# Patient Record
Sex: Female | Born: 1935 | Race: White | Hispanic: No | Smoking: Never smoker
Health system: Southern US, Community
[De-identification: ages and names within clinical notes are randomized; demographics above are authoritative.]

## PROBLEM LIST (undated history)

## (undated) DIAGNOSIS — I5022 Chronic systolic (congestive) heart failure: Secondary | ICD-10-CM

## (undated) DIAGNOSIS — I519 Heart disease, unspecified: Secondary | ICD-10-CM

## (undated) DIAGNOSIS — I10 Essential (primary) hypertension: Secondary | ICD-10-CM

## (undated) DIAGNOSIS — K625 Hemorrhage of anus and rectum: Secondary | ICD-10-CM

## (undated) DIAGNOSIS — E538 Deficiency of other specified B group vitamins: Secondary | ICD-10-CM

## (undated) DIAGNOSIS — IMO0002 Reserved for concepts with insufficient information to code with codable children: Secondary | ICD-10-CM

## (undated) DIAGNOSIS — K5732 Diverticulitis of large intestine without perforation or abscess without bleeding: Secondary | ICD-10-CM

## (undated) DIAGNOSIS — M1710 Unilateral primary osteoarthritis, unspecified knee: Secondary | ICD-10-CM

## (undated) DIAGNOSIS — I509 Heart failure, unspecified: Secondary | ICD-10-CM

## (undated) DIAGNOSIS — I4891 Unspecified atrial fibrillation: Secondary | ICD-10-CM

## (undated) DIAGNOSIS — M899 Disorder of bone, unspecified: Secondary | ICD-10-CM

## (undated) DIAGNOSIS — K56609 Unspecified intestinal obstruction, unspecified as to partial versus complete obstruction: Secondary | ICD-10-CM

## (undated) DIAGNOSIS — M949 Disorder of cartilage, unspecified: Secondary | ICD-10-CM

## (undated) HISTORY — DX: Heart disease, unspecified: I51.9

## (undated) HISTORY — DX: Essential (primary) hypertension: I10

## (undated) HISTORY — DX: Disorder of cartilage, unspecified: M94.9

## (undated) HISTORY — DX: Unspecified intestinal obstruction, unspecified as to partial versus complete obstruction: K56.609

## (undated) HISTORY — DX: Heart failure, unspecified: I50.9

## (undated) HISTORY — DX: Reserved for concepts with insufficient information to code with codable children: IMO0002

## (undated) HISTORY — DX: Deficiency of other specified B group vitamins: E53.8

## (undated) HISTORY — DX: Disorder of bone, unspecified: M89.9

## (undated) HISTORY — DX: Unilateral primary osteoarthritis, unspecified knee: M17.10

## (undated) HISTORY — DX: Hemorrhage of anus and rectum: K62.5

## (undated) HISTORY — DX: Diverticulitis of large intestine without perforation or abscess without bleeding: K57.32

## (undated) HISTORY — DX: Unspecified atrial fibrillation: I48.91

## (undated) HISTORY — DX: Chronic systolic (congestive) heart failure: I50.22

---
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
## 1964-01-09 HISTORY — PX: TONSILLECTOMY: SHX5217

## 1994-01-08 HISTORY — PX: ABDOMINAL HYSTERECTOMY: SHX81

## 2004-01-09 HISTORY — PX: COLON SURGERY: SHX602

## 2007-12-09 ENCOUNTER — Encounter: Admission: RE | Admit: 2007-12-09 | Discharge: 2007-12-09 | Payer: Self-pay | Admitting: Family Medicine

## 2008-03-18 ENCOUNTER — Inpatient Hospital Stay (HOSPITAL_COMMUNITY): Admission: EM | Admit: 2008-03-18 | Discharge: 2008-03-27 | Payer: Self-pay | Admitting: Emergency Medicine

## 2008-03-19 ENCOUNTER — Encounter (INDEPENDENT_AMBULATORY_CARE_PROVIDER_SITE_OTHER): Payer: Self-pay | Admitting: Internal Medicine

## 2008-03-21 ENCOUNTER — Encounter (INDEPENDENT_AMBULATORY_CARE_PROVIDER_SITE_OTHER): Payer: Self-pay | Admitting: *Deleted

## 2008-03-25 ENCOUNTER — Ambulatory Visit: Payer: Self-pay | Admitting: Gastroenterology

## 2008-03-26 ENCOUNTER — Encounter (INDEPENDENT_AMBULATORY_CARE_PROVIDER_SITE_OTHER): Payer: Self-pay | Admitting: *Deleted

## 2008-04-13 ENCOUNTER — Encounter: Admission: RE | Admit: 2008-04-13 | Discharge: 2008-05-03 | Payer: Self-pay | Admitting: Family Medicine

## 2008-05-05 ENCOUNTER — Ambulatory Visit: Payer: Self-pay | Admitting: Gastroenterology

## 2008-05-05 DIAGNOSIS — Z860100 Personal history of colon polyps, unspecified: Secondary | ICD-10-CM | POA: Insufficient documentation

## 2008-05-05 DIAGNOSIS — K56609 Unspecified intestinal obstruction, unspecified as to partial versus complete obstruction: Secondary | ICD-10-CM | POA: Insufficient documentation

## 2008-05-05 DIAGNOSIS — R933 Abnormal findings on diagnostic imaging of other parts of digestive tract: Secondary | ICD-10-CM | POA: Insufficient documentation

## 2008-05-05 HISTORY — DX: Unspecified intestinal obstruction, unspecified as to partial versus complete obstruction: K56.609

## 2008-05-06 ENCOUNTER — Telehealth: Payer: Self-pay | Admitting: Gastroenterology

## 2008-05-20 ENCOUNTER — Telehealth: Payer: Self-pay | Admitting: Gastroenterology

## 2008-05-24 ENCOUNTER — Telehealth: Payer: Self-pay | Admitting: Gastroenterology

## 2008-05-25 ENCOUNTER — Encounter: Payer: Self-pay | Admitting: Gastroenterology

## 2008-05-25 ENCOUNTER — Ambulatory Visit: Payer: Self-pay | Admitting: Gastroenterology

## 2008-05-27 ENCOUNTER — Encounter: Payer: Self-pay | Admitting: Gastroenterology

## 2008-10-26 ENCOUNTER — Encounter: Admission: RE | Admit: 2008-10-26 | Discharge: 2008-10-26 | Payer: Self-pay | Admitting: Family Medicine

## 2008-10-26 ENCOUNTER — Encounter: Payer: Self-pay | Admitting: Family Medicine

## 2009-01-27 ENCOUNTER — Ambulatory Visit: Payer: Self-pay | Admitting: Family Medicine

## 2009-01-27 DIAGNOSIS — I509 Heart failure, unspecified: Secondary | ICD-10-CM

## 2009-01-27 DIAGNOSIS — M899 Disorder of bone, unspecified: Secondary | ICD-10-CM

## 2009-01-27 DIAGNOSIS — K5732 Diverticulitis of large intestine without perforation or abscess without bleeding: Secondary | ICD-10-CM

## 2009-01-27 DIAGNOSIS — G629 Polyneuropathy, unspecified: Secondary | ICD-10-CM | POA: Insufficient documentation

## 2009-01-27 DIAGNOSIS — M949 Disorder of cartilage, unspecified: Secondary | ICD-10-CM

## 2009-01-27 HISTORY — DX: Diverticulitis of large intestine without perforation or abscess without bleeding: K57.32

## 2009-01-27 HISTORY — DX: Heart failure, unspecified: I50.9

## 2009-01-27 HISTORY — DX: Disorder of bone, unspecified: M89.9

## 2009-01-31 ENCOUNTER — Ambulatory Visit: Payer: Self-pay | Admitting: Family Medicine

## 2009-02-08 ENCOUNTER — Ambulatory Visit: Payer: Self-pay | Admitting: Family Medicine

## 2009-02-08 DIAGNOSIS — M1710 Unilateral primary osteoarthritis, unspecified knee: Secondary | ICD-10-CM

## 2009-02-08 DIAGNOSIS — IMO0002 Reserved for concepts with insufficient information to code with codable children: Secondary | ICD-10-CM | POA: Insufficient documentation

## 2009-02-08 HISTORY — DX: Reserved for concepts with insufficient information to code with codable children: IMO0002

## 2009-02-11 ENCOUNTER — Ambulatory Visit: Payer: Self-pay | Admitting: Family Medicine

## 2009-02-14 LAB — CONVERTED CEMR LAB
BUN: 11 mg/dL (ref 6–23)
Basophils Relative: 0.1 % (ref 0.0–3.0)
CO2: 31 meq/L (ref 19–32)
Chloride: 103 meq/L (ref 96–112)
Creatinine, Ser: 0.9 mg/dL (ref 0.4–1.2)
Eosinophils Relative: 1.1 % (ref 0.0–5.0)
HCT: 38.8 % (ref 36.0–46.0)
Hemoglobin: 12.7 g/dL (ref 12.0–15.0)
Lymphocytes Relative: 23.5 % (ref 12.0–46.0)
Lymphs Abs: 1.5 10*3/uL (ref 0.7–4.0)
Monocytes Relative: 6.9 % (ref 3.0–12.0)
Neutro Abs: 4.4 10*3/uL (ref 1.4–7.7)
Potassium: 4.4 meq/L (ref 3.5–5.1)
RBC: 3.89 M/uL (ref 3.87–5.11)
Sed Rate: 39 mm/hr — ABNORMAL HIGH (ref 0–22)
WBC: 6.4 10*3/uL (ref 4.5–10.5)

## 2009-02-28 ENCOUNTER — Ambulatory Visit: Payer: Self-pay | Admitting: Family Medicine

## 2009-03-10 ENCOUNTER — Telehealth: Payer: Self-pay | Admitting: Family Medicine

## 2009-04-27 ENCOUNTER — Ambulatory Visit: Payer: Self-pay | Admitting: Family Medicine

## 2009-07-13 ENCOUNTER — Ambulatory Visit: Payer: Self-pay | Admitting: Family Medicine

## 2009-07-25 ENCOUNTER — Encounter: Payer: Self-pay | Admitting: Family Medicine

## 2009-07-25 DIAGNOSIS — I5022 Chronic systolic (congestive) heart failure: Secondary | ICD-10-CM

## 2009-07-25 HISTORY — DX: Chronic systolic (congestive) heart failure: I50.22

## 2009-08-01 ENCOUNTER — Ambulatory Visit: Payer: Self-pay | Admitting: Family Medicine

## 2009-08-03 ENCOUNTER — Encounter: Payer: Self-pay | Admitting: Family Medicine

## 2009-09-21 ENCOUNTER — Telehealth: Payer: Self-pay | Admitting: Family Medicine

## 2009-09-30 ENCOUNTER — Ambulatory Visit: Payer: Self-pay | Admitting: Family Medicine

## 2009-09-30 DIAGNOSIS — I10 Essential (primary) hypertension: Secondary | ICD-10-CM | POA: Insufficient documentation

## 2009-09-30 HISTORY — DX: Essential (primary) hypertension: I10

## 2009-10-06 ENCOUNTER — Ambulatory Visit: Payer: Self-pay | Admitting: Family Medicine

## 2009-10-11 ENCOUNTER — Ambulatory Visit: Payer: Self-pay | Admitting: Internal Medicine

## 2009-10-18 ENCOUNTER — Ambulatory Visit: Payer: Self-pay | Admitting: Family Medicine

## 2009-10-25 ENCOUNTER — Ambulatory Visit: Payer: Self-pay | Admitting: Family Medicine

## 2009-11-01 ENCOUNTER — Ambulatory Visit: Payer: Self-pay | Admitting: Family Medicine

## 2009-11-02 ENCOUNTER — Ambulatory Visit: Payer: Self-pay | Admitting: Family Medicine

## 2009-11-21 ENCOUNTER — Encounter: Admission: RE | Admit: 2009-11-21 | Discharge: 2009-11-21 | Payer: Self-pay | Admitting: Family Medicine

## 2009-11-25 ENCOUNTER — Ambulatory Visit: Payer: Self-pay | Admitting: Family Medicine

## 2009-11-25 DIAGNOSIS — E538 Deficiency of other specified B group vitamins: Secondary | ICD-10-CM

## 2009-11-25 HISTORY — DX: Deficiency of other specified B group vitamins: E53.8

## 2009-11-30 ENCOUNTER — Ambulatory Visit: Payer: Self-pay | Admitting: Family Medicine

## 2009-11-30 DIAGNOSIS — H6120 Impacted cerumen, unspecified ear: Secondary | ICD-10-CM | POA: Insufficient documentation

## 2009-12-22 ENCOUNTER — Ambulatory Visit: Payer: Self-pay | Admitting: Family Medicine

## 2010-01-04 ENCOUNTER — Telehealth: Payer: Self-pay | Admitting: Family Medicine

## 2010-02-01 ENCOUNTER — Ambulatory Visit
Admission: RE | Admit: 2010-02-01 | Discharge: 2010-02-01 | Payer: Self-pay | Source: Home / Self Care | Attending: Family Medicine | Admitting: Family Medicine

## 2010-02-01 DIAGNOSIS — K219 Gastro-esophageal reflux disease without esophagitis: Secondary | ICD-10-CM | POA: Insufficient documentation

## 2010-02-01 DIAGNOSIS — K644 Residual hemorrhoidal skin tags: Secondary | ICD-10-CM | POA: Insufficient documentation

## 2010-02-06 ENCOUNTER — Telehealth: Payer: Self-pay | Admitting: Family Medicine

## 2010-02-07 NOTE — Assessment & Plan Note (Signed)
Summary: Follow up/cb   Vital Signs:  Patient profile:   77 year old female Menstrual status:  hysterectomy Weight:      152 pounds Temp:     98.1 degrees F oral BP sitting:   130 / 80  (left arm) Cuff size:   regular  Vitals Entered By: Duard Brady LPN (November 30, 2009 8:03 AM) CC: f/u on blood pressure - med still make her feel lightheaded Is Patient Diabetic? No   History of Present Illness: Patient seen for followup.  Hypertension and had recent side effects with amlodipine. We started losartan. She has had some very mild nausea without vomiting and slight lightheadedness. Blood pressures have been improved though. No other side effects. Still exercising regularly.  History of neuropathy mostly right thigh with reported prior history of shingles. She recalls being intolerant of multiple medications including Neurontin. She does not recall if she tried Lyrica. Pain is moderate. She is reluctant to consider other medications at this time.  She has ongoing problems with hemorrhoids. Using hydrocortisone suppositories and steroid cream without much improvement. No recent constipation issues. Recent colonoscopy.  Allergies: 1)  ! Penicillin 2)  ! Septra 3)  ! Codeine 4)  Macrobid (Nitrofurantoin Monohyd Macro)  Past History:  Past Medical History: Last updated: 01/27/2009 Hypertension Urinary Tract Infection Congestive Heart Failure 2005 ?etiology Arthritis Endometriosis Pneumonia 2005 Diverticultitis 2006 SBO, adhesions 2010 ?Osteopenia  Past Surgical History: Last updated: 01/27/2009 Sigmoid colectomy for diverticulitis 2006 TAHBSO 1966 Appendectomy 1951 Hysterectomy, 1934 Tonsillectomy, 1966  Family History: Last updated: 01/27/2009 No FH of Colon Cancer: Family History of Colitis/Crohn's:Father Family History of Arthritis Heart disease, mother Stroke, mother Family History Hypertension, mother Diabebts  Social History: Last updated:  05/05/2008 Patient has never smoked.  Alcohol Use - no Illicit Drug Use - no Occupation: Retired  Risk Factors: Smoking Status: never (02/11/2009) PMH-FH-SH reviewed for relevance  Review of Systems  The patient denies anorexia, fever, weight loss, chest pain, syncope, dyspnea on exertion, peripheral edema, prolonged cough, headaches, hemoptysis, abdominal pain, melena, hematochezia, severe indigestion/heartburn, and muscle weakness.    Physical Exam  General:  Well-developed,well-nourished,in no acute distress; alert,appropriate and cooperative throughout examination Ears:  cerumen impaction right canal. Removed with curet without difficulty Mouth:  Oral mucosa and oropharynx without lesions or exudates.  Teeth in good repair. Neck:  No deformities, masses, or tenderness noted. Lungs:  Normal respiratory effort, chest expands symmetrically. Lungs are clear to auscultation, no crackles or wheezes. Heart:  normal rate and regular rhythm.   Abdomen:  soft, non-tender, no distention, no masses, no guarding, no rigidity, no hepatomegaly, and no splenomegaly.  multiple scars from prior surgery Extremities:  no edema Neurologic:  alert & oriented X3 and cranial nerves II-XII intact.     Impression & Recommendations:  Problem # 1:  ESSENTIAL HYPERTENSION (ICD-401.9) Assessment Improved  Her updated medication list for this problem includes:    Losartan Potassium 50 Mg Tabs (Losartan potassium) ..... One by mouth once daily  Problem # 2:  NEUROPATHY (ICD-355.9) she will check to see if has used Lyrica or neurontin previously, though she has been intolerant to many meds in past.  Problem # 3:  CERUMEN IMPACTION (ICD-380.4)  Problem # 4:  EXTERNAL HEMORRHOIDS (ICD-455.3) discussed with pt possible referral to general surgeon given her ongoing symptoms for months not relieved with conservative measures.  Complete Medication List: 1)  Multivitamins Tabs (Multiple vitamin) .... Once  daily, hold 2)  Loratadine 10 Mg Tabs (Loratadine) .Marland KitchenMarland KitchenMarland Kitchen  Once daily 3)  Proctozone-hc 2.5 % Crea (Hydrocortisone) .... Use as directed as needed. 4)  Hydrocortisone Acetate 25 Mg Supp (Hydrocortisone acetate) .... One supp two times a day for 2 weeks 5)  Losartan Potassium 50 Mg Tabs (Losartan potassium) .... One by mouth once daily 6)  Cyanocobalamin 1000 Mcg/ml Soln (Cyanocobalamin) .... One ml im weekly x 4 weeks, then monthly.  Patient Instructions: 1)  Please schedule a follow-up appointment in 2 months.  2)  Take losartan at night 3)  Check to see if you have taken Lyrica or  Neurontin previously for neuropathy pain   Orders Added: 1)  Est. Patient Level IV [70962]

## 2010-02-07 NOTE — Assessment & Plan Note (Signed)
Summary: RECTAL BLEEDING // RS   Vital Signs:  Patient profile:   77 year old female Menstrual status:  hysterectomy Weight:      152 pounds Temp:     98.2 degrees F oral BP sitting:   130 / 80  (left arm) Cuff size:   regular  Vitals Entered By: Kathrynn Speed CMA (July 13, 2009 10:29 AM) CC: Rectal Bleeding [name]   History of Present Illness: Patient seen with bright red blood per rectum initial onset last Friday. At that point  no pain with bowel movement. Now has some burning sensation with bowel movement. A couple episodes of some bright blood with wiping. No history of hemorrhoids. Colonoscopy May 25, 2008 with benign colon polyp. Patient denies aspirin use. No significant constipation or diarrhea. Has used hydrocortisone cream in the past with some improvement in hemorrhoids.  Denies appetite or weight changes.  Current Medications (verified): 1)  Multivitamins   Tabs (Multiple Vitamin) .... Once Daily, Hold 2)  Loratadine 10 Mg Tabs (Loratadine) .... Once Daily 3)  Hemorrhoidal 79.3-3 % Oint (Rectal Protectant-Emollient) .... As Needed 4)  Desitin 40 % Oint (Zinc Oxide) .... As Needed  Allergies (verified): 1)  ! Penicillin 2)  ! Septra 3)  ! Codeine 4)  Macrobid (Nitrofurantoin Monohyd Macro)  Past History:  Past Medical History: Last updated: 01/27/2009 Hypertension Urinary Tract Infection Congestive Heart Failure 2005 ?etiology Arthritis Endometriosis Pneumonia 2005 Diverticultitis 2006 SBO, adhesions 2010 ?Osteopenia  Past Surgical History: Last updated: 01/27/2009 Sigmoid colectomy for diverticulitis 2006 TAHBSO 1966 Appendectomy 1951 Hysterectomy, 1934 Tonsillectomy, 1966  Family History: Last updated: 01/27/2009 No FH of Colon Cancer: Family History of Colitis/Crohn's:Father Family History of Arthritis Heart disease, mother Stroke, mother Family History Hypertension, mother Diabebts PMH-FH-SH reviewed for relevance  Review of Systems       The patient complains of hematochezia.  The patient denies anorexia, fever, weight loss, chest pain, abdominal pain, melena, and severe indigestion/heartburn.    Physical Exam  General:  Well-developed,well-nourished,in no acute distress; alert,appropriate and cooperative throughout examination Lungs:  Normal respiratory effort, chest expands symmetrically. Lungs are clear to auscultation, no crackles or wheezes. Heart:  normal rate and regular rhythm.   Rectal:  patient has very inflamed external hemorrhoids. No active bleeding. No visible fissure. Digital exam not attempted given her discomfort and degree of inflammation of hemorrhoids.  These extend into rectum.   Impression & Recommendations:  Problem # 1:  HEMORRHOIDS, EXTERNAL (ICD-455.3) Assessment New hydrocortisone supp and she will contine with topical steroid.  Discussed constipation prevention.  Complete Medication List: 1)  Multivitamins Tabs (Multiple vitamin) .... Once daily, hold 2)  Loratadine 10 Mg Tabs (Loratadine) .... Once daily 3)  Hemorrhoidal 79.3-3 % Oint (Rectal protectant-emollient) .... As needed 4)  Desitin 40 % Oint (Zinc oxide) .... As needed 5)  Proctozone-hc 2.5 % Crea (Hydrocortisone) .... Use as directed as needed. 6)  Hydrocortisone Acetate 25 Mg Supp (Hydrocortisone acetate) .... One suppository two times a day for one week.  Patient Instructions: 1)  drink plenty of fluids 2)  Eat plenty of fiber 3)  Consider stool softener such as Colace one to 2 capsules daily to prevent constipation 4)  Please schedule a follow-up appointment in 2 weeks.  Prescriptions: PROCTOZONE-HC 2.5 % CREA (HYDROCORTISONE) use as directed as needed.  #28.5 gm x 1   Entered and Authorized by:   Evelena Peat MD   Signed by:   Evelena Peat MD on 07/13/2009   Method  used:   Electronically to        [facility]  [phone]* (retail)       [address]       [place],  [place]  [address]       Ph: [phone] or [phone]       Fax: [phone]   RxID:   [id number] HYDROCORTISONE ACETATE 25 MG SUPP (HYDROCORTISONE ACETATE) one suppository two times a day for one week.  #14 x 1   Entered and Authorized by:   Evelena Peat MD   Signed by:   Evelena Peat MD on 07/13/2009   Method used:   Electronically to        [facility]  [phone]* (retail)       [address]       [place], [place]  [address]       Ph: [phone] or [phone]       Fax: [phone]   RxID:   [id number]

## 2010-02-07 NOTE — Letter (Signed)
Summary: [facility]  [facility]   Imported By: Maryln Gottron 08/24/2009 11:22:10  _____________________________________________________________________  External Attachment:    Type:   Image     Comment:   External Document

## 2010-02-07 NOTE — Assessment & Plan Note (Signed)
Summary: body aches/legs swelling/cjr   Vital Signs:  Patient profile:   77 year old female Menstrual status:  hysterectomy Weight:      148 pounds Temp:     99.2 degrees F oral BP sitting:   130 / 80  (left arm) Cuff size:   regular  Vitals Entered By: Sid Falcon LPN (February 08, 2009 9:40 AM) CC: Body achesl right leg swelling Is Patient Diabetic? No   History of Present Illness: Patient here predominantly for right knee pain. Recent sinus infection treated with Levaquin. She's had some mild body aches and wonders if the medication may have been related. No skin rash. History of known osteoarthritis right knee. Recently had some swelling. No recurrent injury. Inject with cortisone over year ago and had good response. Is inquiring about whether the same to be done today. No warmth or erythema.  Tylenol  without much relief.  Preventive Screening-Counseling & Management  Alcohol-Tobacco     Smoking Status: never  Allergies: 1)  ! Penicillin 2)  ! Septra 3)  ! Codeine 4)  Macrobid (Nitrofurantoin Monohyd Macro)  Past History:  Past Medical History: Last updated: 01/27/2009 Hypertension Urinary Tract Infection Congestive Heart Failure 2005 ?etiology Arthritis Endometriosis Pneumonia 2005 Diverticultitis 2006 SBO, adhesions 2010 ?Osteopenia  Past Surgical History: Last updated: 01/27/2009 Sigmoid colectomy for diverticulitis 2006 TAHBSO 1966 Appendectomy 1951 Hysterectomy, 1934 Tonsillectomy, 1966  Review of Systems  The patient denies fever, weight loss, chest pain, dyspnea on exertion, and prolonged cough.    Physical Exam  General:  Well-developed,well-nourished,in no acute distress; alert,appropriate and cooperative throughout examination Ears:  External ear exam shows no significant lesions or deformities.  Otoscopic examination reveals clear canals, tympanic membranes are intact bilaterally without bulging, retraction, inflammation or discharge.  Hearing is grossly normal bilaterally. Nose:  External nasal examination shows no deformity or inflammation. Nasal mucosa are pink and moist without lesions or exudates. Mouth:  Oral mucosa and oropharynx without lesions or exudates.  Teeth in good repair. Neck:  No deformities, masses, or tenderness noted. Lungs:  Normal respiratory effort, chest expands symmetrically. Lungs are clear to auscultation, no crackles or wheezes. Heart:  normal rate and regular rhythm.   Extremities:  right knee reveals very mild effusion. No warmth or erythema. Full range of motion. Mild to moderate medial joint line tenderness. Anterior cruciate ligament testing is normal   Impression & Recommendations:  Problem # 1:  OSTEOARTHRITIS, KNEE, RIGHT (ICD-715.96)  patient requesting repeat corticosteroid injection and has been over a year since last. Discussed risk and benefits including risk of bleeding, bruising, infection and patient wished to proceed.  knee prepped with Betadine and using 1 and 1/2 inch 25-gauge needle injected 2 cc of plain Xylocaine and 80 mg of Depo-Medrol without difficulty patient tolerated well  Orders: Joint Aspirate / Injection, Large (20610) Depo- Medrol 80mg  (J1040)  Complete Medication List: 1)  Multivitamins Tabs (Multiple vitamin) .... One tablet by mouth once daily 2)  Prevacid 30 Mg Cpdr (Lansoprazole) .... As needed 3)  Calcium 600/vitamin D 600-400 Mg-unit Tabs (Calcium carbonate-vitamin d) .... Once daily 4)  Loratadine 10 Mg Tabs (Loratadine) .... Once daily 5)  Levaquin 500 Mg Tabs (Levofloxacin) .... One by mouth once daily for 10 days. 6)  Proctozone-hc 2.5 % Crea (Hydrocortisone) .... Use as directed as needed.  Patient Instructions: 1)  Resume usual exercises as tolerated in 3-4 days 2)  Leave off Levaquin at this point. 3)  followup promptly if you have any  fever or worsening symptoms.

## 2010-02-07 NOTE — Assessment & Plan Note (Signed)
Summary: B12 INJ // RS  Nurse Visit   Allergies: 1)  ! Penicillin 2)  ! Septra 3)  ! Codeine 4)  Macrobid (Nitrofurantoin Monohyd Macro)  Medication Administration  Injection # 1:    Medication: Vit B12 1000 mcg    Diagnosis: FATIGUE (ICD-780.79)    Route: IM    Site: L deltoid    Exp Date: 07/09/2011    Lot #: [id number]    Mfr: [employer]    Patient tolerated injection without complications    Given by: Sid Falcon LPN (November 01, 2009 12:19 PM)  Orders Added: 1)  Vit B12 1000 mcg [J3420] 2)  Admin of Therapeutic Inj  intramuscular or subcutaneous [91478]

## 2010-02-07 NOTE — Assessment & Plan Note (Signed)
Summary: reassess blood pressure/cb/hemorrhoid pain/cjr   Vital Signs:  Patient profile:   77 year old female Menstrual status:  hysterectomy Weight:      152 pounds Temp:     98.7 degrees F oral BP sitting:   160 / 90  (left arm) Cuff size:   regular  Vitals Entered By: Sid Falcon LPN (September 30, 2009 9:33 AM)  Serial Vital Signs/Assessments:  Time      Position  BP       Pulse  Resp  Temp     By                     162/90                         Evelena Peat MD  CC: BP check, hemmoroids   History of Present Illness: Patient seen for the following  Elevated blood pressure last visit. Overall feels poorly with less energy. No headaches or chest pains. No dizziness. Not monitoring blood pressure at home. Never treated with medication.  Sensation of mouth burning past several weeks. Questionable history of B12 deficiency previously-per pt. She takes MVI and reportedly took B12 injections briefly in [place].  No recent change of toothpaste. No regular mouthwash.  Recent inflamed external hemorrhoids. Occasional bright red blood. Colonoscopy May of 2010 significant for some internal hemorrhoids. No appetite or weight changes. Triamcinolone cream but she feels this causes burning to be worse. Has previiously used suppositories.  Allergies: 1)  ! Penicillin 2)  ! Septra 3)  ! Codeine 4)  Macrobid (Nitrofurantoin Monohyd Macro)  Past History:  Past Medical History: Last updated: 01/27/2009 Hypertension Urinary Tract Infection Congestive Heart Failure 2005 ?etiology Arthritis Endometriosis Pneumonia 2005 Diverticultitis 2006 SBO, adhesions 2010 ?Osteopenia  Past Surgical History: Last updated: 01/27/2009 Sigmoid colectomy for diverticulitis 2006 TAHBSO 1966 Appendectomy 1951 Hysterectomy, 1934 Tonsillectomy, 1966  Family History: Last updated: 01/27/2009 No FH of Colon Cancer: Family History of Colitis/Crohn's:Father Family History of Arthritis Heart  disease, mother Stroke, mother Family History Hypertension, mother Diabebts  Social History: Last updated: 05/05/2008 Patient has never smoked.  Alcohol Use - no Illicit Drug Use - no Occupation: Retired  Risk Factors: Smoking Status: never (02/11/2009) PMH-FH-SH reviewed for relevance  Review of Systems  The patient denies anorexia, fever, weight loss, chest pain, syncope, dyspnea on exertion, peripheral edema, prolonged cough, headaches, hemoptysis, abdominal pain, melena, severe indigestion/heartburn, muscle weakness, and depression.    Physical Exam  General:  Well-developed,well-nourished,in no acute distress; alert,appropriate and cooperative throughout examination Mouth:  Oral mucosa and oropharynx without lesions or exudates.  Teeth in good repair. Neck:  No deformities, masses, or tenderness noted. Lungs:  Normal respiratory effort, chest expands symmetrically. Lungs are clear to auscultation, no crackles or wheezes. Heart:  Normal rate and regular rhythm. S1 and S2 normal without gallop, murmur, click, rub or other extra sounds. Rectal:  pt has inflammed external hemorrhoids without active bleeding.  No thrombosis. Extremities:  No clubbing, cyanosis, edema, or deformity noted with normal full range of motion of all joints.     Impression & Recommendations:  Problem # 1:  ESSENTIAL HYPERTENSION (ICD-401.9) Assessment New  start amlodipine 2.5 mg daily and reassess in one month.  Her updated medication list for this problem includes:    Amlodipine Besylate 2.5 Mg Tabs (Amlodipine besylate) ..... One by mouth once daily  Orders: Prescription Created Electronically [id number])  Problem # 2:  HEMORRHOIDS,  EXTERNAL (ICD-455.3) Assessment: Unchanged  Will try hydrocortisone supp.  Discussed measures to reduce constipation.  Orders: Prescription Created Electronically [phone])  Problem # 3:  DYSESTHESIA (ICD-782.0)  involving mouth.  check  B12.  Orders: Venipuncture (98119) Specimen Handling (14782) TLB-B12, Serum-Total ONLY (95621-H08)  Complete Medication List: 1)  Multivitamins Tabs (Multiple vitamin) .... Once daily, hold 2)  Loratadine 10 Mg Tabs (Loratadine) .... Once daily 3)  Proctozone-hc 2.5 % Crea (Hydrocortisone) .... Use as directed as needed. 4)  Hydrocortisone Acetate 25 Mg Supp (Hydrocortisone acetate) .... One supp two times a day for 2 weeks 5)  Amlodipine Besylate 2.5 Mg Tabs (Amlodipine besylate) .... One by mouth once daily  Patient Instructions: 1)  Please schedule a follow-up appointment in 1 month.  Prescriptions: HYDROCORTISONE ACETATE 25 MG SUPP (HYDROCORTISONE ACETATE) one supp two times a day for 2 weeks  #28 x 0   Entered and Authorized by:   Evelena Peat MD   Signed by:   Evelena Peat MD on 09/30/2009   Method used:   Electronically to        [facility]  [phone]* (retail)       [address]       [place], [place]  [address]       Ph: [phone] or [phone]       Fax: [phone]   RxID:   [id number] AMLODIPINE BESYLATE 2.5 MG TABS (AMLODIPINE BESYLATE) one by mouth once daily  #30 x 5   Entered and Authorized by:   Evelena Peat MD   Signed by:   Evelena Peat MD on 09/30/2009   Method used:   Electronically to        [facility]  [phone]* (retail)       [address]       [place], [place]  [address]       Ph: [phone] or [phone]       Fax: [phone]   RxID:   [id number]    Immunization History:  Influenza Immunization History:    Influenza:  historical (09/23/2009)

## 2010-02-07 NOTE — Letter (Signed)
Summary: Records from [facility] 2010   Records from [facility] 2010   Imported By: Maryln Gottron 07/22/2009 13:18:38  _____________________________________________________________________  External Attachment:    Type:   Image     Comment:   External Document

## 2010-02-07 NOTE — Assessment & Plan Note (Signed)
Summary: 1 mo rov/mm   Vital Signs:  Patient profile:   77 year old female Menstrual status:  hysterectomy Weight:      145 pounds Temp:     99.4 degrees F oral BP sitting:   122 / 72  (left arm) Cuff size:   regular  Vitals Entered By: Sid Falcon LPN (February 28, 2009 10:02 AM) CC: One month follow-up   History of Present Illness: Persistent right knee pain. History known osteoarthritis. No recent injury. Pain worse early morning. Generally improves somewhat with activity. Some stiffness. Recent steroid injection without much improvement. Has not tried topicals such as voltaren gel. Has not noted any warmth or erythema.  Allergies: 1)  ! Penicillin 2)  ! Septra 3)  ! Codeine 4)  Macrobid (Nitrofurantoin Monohyd Macro)  Past History:  Past Medical History: Last updated: 01/27/2009 Hypertension Urinary Tract Infection Congestive Heart Failure 2005 ?etiology Arthritis Endometriosis Pneumonia 2005 Diverticultitis 2006 SBO, adhesions 2010 ?Osteopenia  Social History: Last updated: 05/05/2008 Patient has never smoked.  Alcohol Use - no Illicit Drug Use - no Occupation: Retired PMH reviewed for relevance, PSH reviewed for relevance  Review of Systems      See HPI  Physical Exam  General:  Well-developed,well-nourished,in no acute distress; alert,appropriate and cooperative throughout examination Mouth:  Oral mucosa and oropharynx without lesions or exudates.  Teeth in good repair. Neck:  No deformities, masses, or tenderness noted. Lungs:  Normal respiratory effort, chest expands symmetrically. Lungs are clear to auscultation, no crackles or wheezes. Heart:  normal rate and regular rhythm.   Extremities:  right knee reveals mild crepitus with flexion and extension. No warmth erythema. No effusion. Minimal lateral joint line tenderness   Impression & Recommendations:  Problem # 1:  OSTEOARTHRITIS, KNEE, RIGHT (ICD-715.96) trial of Pennsaid topical.     samples given and instructed in use.  Complete Medication List: 1)  Multivitamins Tabs (Multiple vitamin) .... Once daily, hold 2)  Prevacid 30 Mg Cpdr (Lansoprazole) .... As needed 3)  Calcium 600/vitamin D 600-400 Mg-unit Tabs (Calcium carbonate-vitamin d) .... Once daily, on hold 4)  Loratadine 10 Mg Tabs (Loratadine) .... Once daily 5)  Proctozone-hc 2.5 % Crea (Hydrocortisone) .... As needed.  Patient Instructions: 1)  Pennsaid- use 30-40 drops to right knee 4 times daily.  Wash hands after use.

## 2010-02-07 NOTE — Assessment & Plan Note (Signed)
Summary: B-12//ALP  Nurse Visit   Allergies: 1)  ! Penicillin 2)  ! Septra 3)  ! Codeine 4)  Macrobid (Nitrofurantoin Monohyd Macro)  Medication Administration  Injection # 1:    Medication: Vit B12 1000 mcg    Diagnosis: B12 DEFICIENCY (ICD-266.2)    Route: IM    Site: R deltoid    Exp Date: 07/09/2011    Lot #: [id number]    Mfr: [employer]    Patient tolerated injection without complications    Given by: Sid Falcon LPN (November 25, 2009 10:18 AM)  Orders Added: 1)  Vit B12 1000 mcg [J3420] 2)  Admin of Therapeutic Inj  intramuscular or subcutaneous [[id number]]

## 2010-02-07 NOTE — Assessment & Plan Note (Signed)
Summary: F/U FROM SINUSITIS, H/A // RS   Vital Signs:  Patient profile:   77 year old female Menstrual status:  hysterectomy Temp:     99.1 degrees F oral BP sitting:   140 / 88  (left arm) Cuff size:   regular  Vitals Entered By: Sid Falcon LPN (January 31, 2009 1:16 PM) CC: Follow-up for sinusitis, headache   History of Present Illness: Patient has had some recent sinus infection symptoms. Treated through urgent care with azithromycin almost 10 days ago. Slightly improved last week but now has some recurrent headaches occasional yellow-green nasal tinged mucus and intermittent facial pain. Occ cough. No dyspnea. Intermittent bilateral earache and sore throat. She is allergic to penicillin, sulfa, Macrobid, and codeine.  Pt also relates hx of "hemorrhoids" and requests refill of proctozone cream which worked well for her.  Allergies: 1)  ! Penicillin 2)  ! Septra 3)  ! Codeine 4)  Macrobid (Nitrofurantoin Monohyd Macro)  Past History:  Past Medical History: Last updated: 01/27/2009 Hypertension Urinary Tract Infection Congestive Heart Failure 2005 ?etiology Arthritis Endometriosis Pneumonia 2005 Diverticultitis 2006 SBO, adhesions 2010 ?Osteopenia  Past Surgical History: Last updated: 01/27/2009 Sigmoid colectomy for diverticulitis 2006 TAHBSO 1966 Appendectomy 1951 Hysterectomy, 1934 Tonsillectomy, 1966  Review of Systems      See HPI  Physical Exam  General:  Well-developed,well-nourished,in no acute distress; alert,appropriate and cooperative throughout examination Head:  Normocephalic and atraumatic without obvious abnormalities. No apparent alopecia or balding. Eyes:  No corneal or conjunctival inflammation noted. EOMI. Perrla. Funduscopic exam benign, without hemorrhages, exudates or papilledema. Vision grossly normal. Ears:  External ear exam shows no significant lesions or deformities.  Otoscopic examination reveals clear canals, tympanic membranes  are intact bilaterally without bulging, retraction, inflammation or discharge. Hearing is grossly normal bilaterally. Nose:  erythematous nasal mucosa otherwise normal Mouth:  Oral mucosa and oropharynx without lesions or exudates.  Teeth in good repair. Neck:  No deformities, masses, or tenderness noted. Lungs:  patient has somewhat decreased aeration left base which seem to improve with deep breathing. No wheezes. No retractions. Heart:  Normal rate and regular rhythm. S1 and S2 normal without gallop, murmur, click, rub or other extra sounds.   Impression & Recommendations:  Problem # 1:  SINUSITIS, ACUTE (ICD-461.9)  start Levaquin 500 mg daily for 10 days. Consider limited CT of sinuses if no better after that  Her updated medication list for this problem includes:    Levaquin 500 Mg Tabs (Levofloxacin) ..... One by mouth once daily for 10 days.  Complete Medication List: 1)  Multivitamins Tabs (Multiple vitamin) .... One tablet by mouth once daily 2)  Prevacid 30 Mg Cpdr (Lansoprazole) .... As needed 3)  Calcium 600/vitamin D 600-400 Mg-unit Tabs (Calcium carbonate-vitamin d) .... Once daily 4)  Loratadine 10 Mg Tabs (Loratadine) .... Once daily 5)  Levaquin 500 Mg Tabs (Levofloxacin) .... One by mouth once daily for 10 days. 6)  Proctozone-hc 2.5 % Crea (Hydrocortisone) .... Use as directed as needed.  Patient Instructions: 1)   drink plenty of fluids. 2)  followup if sinusitis symptoms not fully cleared after treatment with antibiotic. Prescriptions: PROCTOZONE-HC 2.5 % CREA (HYDROCORTISONE) use as directed as needed.  #1 tube x 1   Entered and Authorized by:   Evelena Peat MD   Signed by:   Evelena Peat MD on 01/31/2009   Method used:   Electronically to        [facility]  [phone]* (retail)  [address]       [place], [place]  [address]       Ph: [phone] or [phone]       Fax: [phone]   RxID:    [id number] LEVAQUIN 500 MG TABS (LEVOFLOXACIN) one by mouth once daily for 10 days.  #10 x 0   Entered and Authorized by:   Evelena Peat MD   Signed by:   Evelena Peat MD on 01/31/2009   Method used:   Electronically to        [facility]  [phone]* (retail)       [address]       [place], [place]  [address]       Ph: [phone] or [phone]       Fax: [phone]   RxID:   [id number]

## 2010-02-07 NOTE — Letter (Signed)
Summary: [facility]   Imported By: Maryln Gottron 08/02/2009 10:26:49  _____________________________________________________________________  External Attachment:    Type:   Image     Comment:   External Document

## 2010-02-07 NOTE — Assessment & Plan Note (Signed)
Summary: B12 INJ // RS  Nurse Visit   Allergies: 1)  ! Penicillin 2)  ! Septra 3)  ! Codeine 4)  Macrobid (Nitrofurantoin Monohyd Macro)  Medication Administration  Injection # 1:    Medication: Vit B12 1000 mcg    Diagnosis: FATIGUE (ICD-780.79)    Route: IM    Site: L deltoid    Exp Date: 07/09/2011    Lot #: [id number]    Mfr: [employer]    Patient tolerated injection without complications    Given by: Sid Falcon LPN (October 18, 2009 12:16 PM)  Orders Added: 1)  Vit B12 1000 mcg [J3420] 2)  Admin of Therapeutic Inj  intramuscular or subcutaneous [[id number]]

## 2010-02-07 NOTE — Assessment & Plan Note (Signed)
Summary: B12 INJ/CB  Nurse Visit   Allergies: 1)  ! Penicillin 2)  ! Septra 3)  ! Codeine 4)  Macrobid (Nitrofurantoin Monohyd Macro)  Medication Administration  Injection # 1:    Medication: Vit B12 1000 mcg    Diagnosis: FATIGUE (ICD-780.79)    Route: IM    Site: L deltoid    Exp Date: 04/09/2011    Lot #: [id number]    Mfr: [employer]    Patient tolerated injection without complications    Given by: Sid Falcon LPN (October 06, 2009 12:39 PM)  Orders Added: 1)  Vit B12 1000 mcg [J3420] 2)  Admin of Therapeutic Inj  intramuscular or subcutaneous [[id number]]

## 2010-02-07 NOTE — Assessment & Plan Note (Signed)
Summary: NEW TO EST//LH   Vital Signs:  Patient profile:   77 year old female Menstrual status:  hysterectomy Height:      65.5 inches Weight:      149 pounds Temp:     98.2 degrees F oral Pulse rate:   80 / minute Pulse rhythm:   regular Resp:     12 per minute BP sitting:   140 / 90  (left arm) Cuff size:   regular  Vitals Entered By: Sid Falcon LPN (January 27, 2009 10:22 AM) CC: New pt to establish Is Patient Diabetic? No     Menstrual Status hysterectomy   History of Present Illness: Pleasant 77 year old female to establish care. No old records for review at this time. Patient provides history and is somewhat difficult to follow at times. We have gleaned the following from her written and spoken medical history.  Reportedly had history of congestive heart failure back in 2005. Denies history of CAD. Questionable etiology. Reportedly saw a cardiologist here when she moved here from [place] several months ago and was apparently taken off several medications including Coreg and enalapril. Doing extremely well this time with no exercise intolerance OFF meds. No chest pains.  Reported history of osteopenia. Patient states she had DEXA scan November of 2010. Takes calcium and vitamin D and exercises. Placed on Fosamax but did not tolerate. No fracture history.  History of diverticulitis. No recent flare. ? hx partial colectomy 2006 related to complicated infection (?diverticulitis related). Hx colonoscopy in May of 2010.  Reported small bowel obstruction March 2010. Hospitalized for 7 days. No surgery done.  Resolved with cnservative management.  Currently being treated for presumed sinusitis. Went to urgent care. On day 5 of Zithromax. Also given Nasonex. Using saline nasal irrigation. Still has some greenish nasal discharge but no fever. Has some malaise.  Patient also relates long-standing history of neuropathy involving lower extremities. Unsure of prior extent of workup.  She is reluctant to consider medications and apparently has been intolerance of medications in the past. No history of diabetes.  Preventive Screening-Counseling & Management  Alcohol-Tobacco     Smoking Status: never  Allergies: 1)  ! Penicillin 2)  ! Septra 3)  ! Codeine 4)  Macrobid (Nitrofurantoin Monohyd Macro)  Past History:  Family History: Last updated: 01/27/2009 No FH of Colon Cancer: Family History of Colitis/Crohn's:Father Family History of Arthritis Heart disease, mother Stroke, mother Family History Hypertension, mother Diabebts  Social History: Last updated: 05/05/2008 Patient has never smoked.  Alcohol Use - no Illicit Drug Use - no Occupation: Retired  Risk Factors: Smoking Status: never (01/27/2009)  Past Medical History: Hypertension Urinary Tract Infection Congestive Heart Failure 2005 ?etiology Arthritis Endometriosis Pneumonia 2005 Diverticultitis 2006 SBO, adhesions 2010 ?Osteopenia  Past Surgical History: Sigmoid colectomy for diverticulitis 2006 TAHBSO 1966 Appendectomy 1951 Hysterectomy, 1934 Tonsillectomy, 1966  Family History: No FH of Colon Cancer: Family History of Colitis/Crohn's:Father Family History of Arthritis Heart disease, mother Stroke, mother Family History Hypertension, mother Diabebts  Review of Systems  The patient denies anorexia, fever, weight loss, weight gain, hoarseness, chest pain, syncope, dyspnea on exertion, peripheral edema, prolonged cough, hemoptysis, abdominal pain, melena, hematochezia, severe indigestion/heartburn, hematuria, depression, and enlarged lymph nodes.    Physical Exam  General:  alert cooperative and energetic 77 year old female Head:  Normocephalic and atraumatic without obvious abnormalities. No apparent alopecia or balding. Eyes:  pupils equal, pupils round, pupils reactive to light, and corneas and lenses clear.   Ears:  External ear exam shows no significant lesions or  deformities.  Otoscopic examination reveals clear canals, tympanic membranes are intact bilaterally without bulging, retraction, inflammation or discharge. Hearing is grossly normal bilaterally. Nose:  External nasal examination shows no deformity or inflammation. Nasal mucosa are pink and moist without lesions or exudates. Mouth:  Oral mucosa and oropharynx without lesions or exudates.  Teeth in good repair. Neck:  No deformities, masses, or tenderness noted. Lungs:  Normal respiratory effort, chest expands symmetrically. Lungs are clear to auscultation, no crackles or wheezes. Heart:  normal rate, regular rhythm, no murmur, and no gallop.   Abdomen:  soft and non-tender.   Msk:  Good ROM back. Pulses:  radial pulses normal B. Extremities:  No clubbing, cyanosis, edema, or deformity noted with normal full range of motion of all joints.   Neurologic:  alert & oriented X3, cranial nerves II-XII intact, strength normal in all extremities, gait normal, and DTRs symmetrical and normal.   Skin:  No rashes noted. Cervical Nodes:  No lymphadenopathy noted Psych:  Oriented X3, normally interactive, good eye contact, not anxious appearing, and not depressed appearing.     Impression & Recommendations:  Problem # 1:  CHF (ICD-428.0) By history.  Awaiting old records. The following medications were removed from the medication list:    Coreg 3.125 Mg Tabs (Carvedilol) ..... One tablet by mouth two times a day    Enalapril Maleate 10 Mg Tabs (Enalapril maleate) ..... One tablet by mouth once daily  Problem # 2:  OSTEOPENIA (ICD-733.90) Exercise and discussed Vit D and Calcium.  Get old DEXA results. Her updated medication list for this problem includes:    Calcium 600/vitamin D 600-400 Mg-unit Tabs (Calcium carbonate-vitamin d) ..... Once daily  Problem # 3:  DIVERTICULITIS OF COLON (ICD-562.11)  Problem # 4:  SINUSITIS, ACUTE (ICD-461.9)  Problem # 5:  NEUROPATHY (ICD-355.9) Need old records to  assess prior w/u.  Pt not interested in medication rx options at this time.  Complete Medication List: 1)  Multivitamins Tabs (Multiple vitamin) .... One tablet by mouth once daily 2)  Prevacid 30 Mg Cpdr (Lansoprazole) .... As needed 3)  Calcium 600/vitamin D 600-400 Mg-unit Tabs (Calcium carbonate-vitamin d) .... Once daily  Patient Instructions: 1)  Please schedule a follow-up appointment in 1 month.  2)  Limit your Sodium(salt) .  3)  Continue regular exercise as tolerated.  Preventive Care Screening  Last Flu Shot:    Date:  10/08/2008    Results:  given   Colonoscopy:    Date:  06/09/2007    Results:  normal   Mammogram:    Date:  01/09/2007    Results:  normal   Last Tetanus Booster:    Date:  01/09/2003    Results:  Historical     Immunization History:  Pneumovax Immunization History:    Pneumovax:  historical (01/08/2006)

## 2010-02-07 NOTE — Assessment & Plan Note (Signed)
Summary: follow up/cb   Vital Signs:  Patient profile:   77 year old female Menstrual status:  hysterectomy Weight:      150 pounds Temp:     98.4 degrees F oral BP sitting:   150 / 90  (left arm) Cuff size:   regular  Vitals Entered By: Kathrynn Speed CMA (August 01, 2009 8:16 AM) CC: fu on hemorrhoids, still ithcy & burns,src   History of Present Illness: Followup Inflamed External Hemorrhoids.  Patient has had no further bleeding since last visit. Constipation has resolved. She has used topical and internal cortisone suppository. Still has minimal discomfort and minimal burning sensation. No diarrhea. No appetite or weight changes. Refer to previous dictation. Recent colonoscopy  Separate issue of injury to right fifth toe about 2 weeks ago. She was walking barefooted and stumped her toe. Ambulating without difficulty now.  Still has minimal swelling.  Current Medications (verified): 1)  Multivitamins   Tabs (Multiple Vitamin) .... Once Daily, Hold 2)  Loratadine 10 Mg Tabs (Loratadine) .... Once Daily 3)  Hemorrhoidal 79.3-3 % Oint (Rectal Protectant-Emollient) .... As Needed 4)  Proctozone-Hc 2.5 % Crea (Hydrocortisone) .... Use As Directed As Needed. 5)  Hydrocortisone Acetate 25 Mg Supp (Hydrocortisone Acetate) .... One Suppository Two Times A Day For One Week.  Allergies (verified): 1)  ! Penicillin 2)  ! Septra 3)  ! Codeine 4)  Macrobid (Nitrofurantoin Monohyd Macro)  Past History:  Past Medical History: Last updated: 01/27/2009 Hypertension Urinary Tract Infection Congestive Heart Failure 2005 ?etiology Arthritis Endometriosis Pneumonia 2005 Diverticultitis 2006 SBO, adhesions 2010 ?Osteopenia  Past Surgical History: Last updated: 01/27/2009 Sigmoid colectomy for diverticulitis 2006 TAHBSO 1966 Appendectomy 1951 Hysterectomy, 1934 Tonsillectomy, 1966  Family History: Last updated: 01/27/2009 No FH of Colon Cancer: Family History of  Colitis/Crohn's:Father Family History of Arthritis Heart disease, mother Stroke, mother Family History Hypertension, mother Diabebts  Review of Systems  The patient denies anorexia, fever, weight loss, abdominal pain, melena, hematochezia, and severe indigestion/heartburn.    Physical Exam  General:  Well-developed,well-nourished,in no acute distress; alert,appropriate and cooperative throughout examination Neck:  No deformities, masses, or tenderness noted. Lungs:  Normal respiratory effort, chest expands symmetrically. Lungs are clear to auscultation, no crackles or wheezes. Heart:  normal rate and regular rhythm.   Rectal:  patient still has some inflamed external hemorrhoids but no active bleeding. Overall  appears somewhat less inflamed than last visit. Extremities:  right fifth toe reveals minimal swelling. Minimal tenderness. No ecchymosis. No metatarsal tenderness. Psych:  Oriented X3 and memory intact for recent and remote.     Impression & Recommendations:  Problem # 1:  HEMORRHOIDS, EXTERNAL (ICD-455.3) Assessment Unchanged triamcinolone cream and continue measures to reduce constipation  Problem # 2:  CONTUSION, TOE (ICD-924.3) Assessment: New reassurance given.  Complete Medication List: 1)  Multivitamins Tabs (Multiple vitamin) .... Once daily, hold 2)  Loratadine 10 Mg Tabs (Loratadine) .... Once daily 3)  Proctozone-hc 2.5 % Crea (Hydrocortisone) .... Use as directed as needed. 4)  Triamcinolone Acetonide 0.1 % Crea (Triamcinolone acetonide) .... Use as directed two times a day as needed  Patient Instructions: 1)  Your blood pressure was 150/90 today which is somewhat elevated. 2)  Check your  Blood Pressure regularly . If it is above:140/90 you should make an appointment. 3)  Your weight today was 150 pounds. 4)  Continue regular exercise. 5)  Please schedule a follow-up appointment in 2 months- to reassess blood pressure. Prescriptions: TRIAMCINOLONE  ACETONIDE 0.1 %  CREA (TRIAMCINOLONE ACETONIDE) use as directed two times a day as needed  #30 gm x 2   Entered and Authorized by:   Evelena Peat MD   Signed by:   Evelena Peat MD on 08/01/2009   Method used:   Electronically to        [facility]  [phone]* (retail)       [address]       [place], [place]  [address]       Ph: [phone] or [phone]       Fax: [phone]   RxID:   [id number]

## 2010-02-07 NOTE — Assessment & Plan Note (Signed)
Summary: B12 // RS  Nurse Visit   Allergies: 1)  ! Penicillin 2)  ! Septra 3)  ! Codeine 4)  Macrobid (Nitrofurantoin Monohyd Macro)  Medication Administration  Injection # 1:    Medication: Vit B12 1000 mcg    Diagnosis: FATIGUE (ICD-780.79)    Route: IM    Site: R deltoid    Exp Date: 04/09/2011    Lot #: [id number]    Mfr: [employer]    Patient tolerated injection without complications    Given by: Sid Falcon LPN (October 11, 2009 12:37 PM)  Orders Added: 1)  Vit B12 1000 mcg [J3420] 2)  Admin of Therapeutic Inj  intramuscular or subcutaneous [[id number]]

## 2010-02-07 NOTE — Progress Notes (Signed)
Summary: RETURN CALL?  Phone Note Call from Patient   Caller: Patient [phone] Summary of Call: Pt was taking pennsaid and she adv that she was supposed to call back and let Dr Caryl Never know how it was working and if it has helped her bilateral osteoarthritis knee pain ....... Pt states that the she is still exp pain and the med / drops have not been working.... Pt req a return call from Dr Caryl Never / Harriett Sine, LPN at  [phone].  Initial call taken by: Debbra Riding,  March 10, 2009 3:06 PM  Follow-up for Phone Call        spoke with patient.  Pennsaid no relief.  Pt feels best when exercising.  Has been intolerant of multiple meds in past.  At this point we will observe. Follow-up by: Evelena Peat MD,  March 10, 2009 5:41 PM

## 2010-02-07 NOTE — Assessment & Plan Note (Signed)
Summary: R ARM PAIN // RS   Vital Signs:  Patient profile:   77 year old female Menstrual status:  hysterectomy Weight:      148 pounds O2 Sat:      97 % Temp:     98.4 degrees F Pulse rate:   86 / minute BP sitting:   118 / 78  (left arm)  Vitals Entered By: Pura Spice, RN (April 27, 2009 3:19 PM) CC: painful rt arm    History of Present Illness: This 77 year old white female who does not appear her stated age and is very active She relates that her right shoulder has been painful for approximately 2 weeks when she hyperextends it or moves it or lies on it. She has taken no medication nor the other treatment Blood pressure goal Has history of chronic nasal allergy and receives injections from allergist  Allergies: 1)  ! Penicillin 2)  ! Septra 3)  ! Codeine 4)  Macrobid (Nitrofurantoin Monohyd Macro)  Past History:  Past Medical History: Last updated: 01/27/2009 Hypertension Urinary Tract Infection Congestive Heart Failure 2005 ?etiology Arthritis Endometriosis Pneumonia 2005 Diverticultitis 2006 SBO, adhesions 2010 ?Osteopenia  Past Surgical History: Last updated: 01/27/2009 Sigmoid colectomy for diverticulitis 2006 TAHBSO 1966 Appendectomy 1951 Hysterectomy, 1934 Tonsillectomy, 1966  Social History: Last updated: 05/05/2008 Patient has never smoked.  Alcohol Use - no Illicit Drug Use - no Occupation: Retired  Risk Factors: Smoking Status: never (02/11/2009)  Review of Systems  The patient denies anorexia, fever, weight loss, weight gain, vision loss, decreased hearing, hoarseness, chest pain, syncope, dyspnea on exertion, peripheral edema, prolonged cough, headaches, hemoptysis, abdominal pain, melena, hematochezia, severe indigestion/heartburn, hematuria, incontinence, genital sores, muscle weakness, suspicious skin lesions, transient blindness, difficulty walking, depression, unusual weight change, abnormal bleeding, enlarged lymph nodes,  angioedema, breast masses, and testicular masses.    Physical Exam  General:  Well-developed,well-nourished,in no acute distress; alert,appropriate and cooperative throughout examination Head:  Normocephalic and atraumatic without obvious abnormalities. No apparent alopecia or balding. Eyes:  No corneal or conjunctival inflammation noted. EOMI. Perrla. Funduscopic exam benign, without hemorrhages, exudates or papilledema. Vision grossly normal.glasses Ears:  External ear exam shows no significant lesions or deformities.  Otoscopic examination reveals clear canals, tympanic membranes are intact bilaterally without bulging, retraction, inflammation or discharge. Hearing is grossly normal bilaterally. Nose:  boggy pale nasal mucosa Mouth:  Oral mucosa and oropharynx without lesions or exudates.  Teeth in good repair. Lungs:  Normal respiratory effort, chest expands symmetrically. Lungs are clear to auscultation, no crackles or wheezes. Heart:  Normal rate and regular rhythm. S1 and S2 normal without gallop, murmur, click, rub or other extra sounds. Msk:  marked tenderness over the sub-deltoid bursa right shoulder more painful hyperextension Pulses:  R and L carotid,radial,femoral,dorsalis pedis and posterior tibial pulses are full and equal bilaterally   Impression & Recommendations:  Problem # 1:  BURSITIS, SUBDELTOID (ICD-726.10) Assessment New  injected with Depo-Medrol plus lidocaine  Orders: Joint Aspirate / Injection, Large (20610) Depo- Medrol 80mg  (J1040) Depo- Medrol 40mg  (J1030)  Problem # 2:  ALLERGIC RHINITIS (ICD-477.9) Assessment: Improved  Her updated medication list for this problem includes:    Loratadine 10 Mg Tabs (Loratadine) ..... Once daily received injections from Dr. Mariel Kansky, allergiist  Complete Medication List: 1)  Multivitamins Tabs (Multiple vitamin) .... Once daily, hold 2)  Prevacid 30 Mg Cpdr (Lansoprazole) .... As needed 3)  Calcium  600/vitamin D 600-400 Mg-unit Tabs (Calcium carbonate-vitamin d) .... Once daily, on  hold 4)  Loratadine 10 Mg Tabs (Loratadine) .... Once daily 5)  Proctozone-hc 2.5 % Crea (Hydrocortisone) .... As needed.  Patient Instructions: 1)  diagnosis is subdeltoid bursitis of the right shoulder 2)  Injected with Depo-Medrol 120 mg and 1.5 cc lidocaine 3)  If desired use cold packs 15 minutes and then apply heat for 15 minutes

## 2010-02-07 NOTE — Assessment & Plan Note (Signed)
Summary: 1 mo follow up/cb   Vital Signs:  Patient profile:   77 year old female Menstrual status:  hysterectomy Weight:      152 pounds Temp:     98.2 degrees F oral BP sitting:   160 / 100  (left arm) BP standing:   160 / 90 Cuff size:   regular  Vitals Entered By: Sid Falcon LPN (November 02, 2009 8:31 AM)  Serial Vital Signs/Assessments:  Time      Position  BP       Pulse  Resp  Temp     By           Sitting   160/90                         Evelena Peat MD           Standing  160/90                         Evelena Peat MD   History of Present Illness: Is seen for followup.   Hypertension with initiation of amlodipine last visit. Patient had side effects with nausea, dizziness and lightheadedness and mild headache.  She discontinued after several days. No edema issues.  Hypertension History:      She denies headache, chest pain, palpitations, dyspnea with exertion, peripheral edema, and syncope.  She notes problems with antihypertensive medication side effects.        Positive major cardiovascular risk factors include female age 63 years old or older and hypertension.  Negative major cardiovascular risk factors include non-tobacco-user status.        Positive history for target organ damage include cardiac end organ damage (either CHF or LVH).     Allergies: 1)  ! Penicillin 2)  ! Septra 3)  ! Codeine 4)  Macrobid (Nitrofurantoin Monohyd Macro)  Past History:  Past Medical History: Last updated: 01/27/2009 Hypertension Urinary Tract Infection Congestive Heart Failure 2005 ?etiology Arthritis Endometriosis Pneumonia 2005 Diverticultitis 2006 SBO, adhesions 2010 ?Osteopenia PMH reviewed for relevance  Review of Systems      See HPI  Physical Exam  General:  Well-developed,well-nourished,in no acute distress; alert,appropriate and cooperative throughout examination Neck:  No deformities, masses, or tenderness noted. Lungs:  Normal respiratory  effort, chest expands symmetrically. Lungs are clear to auscultation, no crackles or wheezes. Heart:  normal rate and regular rhythm.   Extremities:  No clubbing, cyanosis, edema, or deformity noted with normal full range of motion of all joints.     Impression & Recommendations:  Problem # 1:  ESSENTIAL HYPERTENSION (ICD-401.9)  d/c amlodipine and start Losartan 50 mg by mouth once daily  Her updated medication list for this problem includes:    Losartan Potassium 50 Mg Tabs (Losartan potassium) ..... One by mouth once daily  Orders: Prescription Created Electronically [phone])  Complete Medication List: 1)  Multivitamins Tabs (Multiple vitamin) .... Once daily, hold 2)  Loratadine 10 Mg Tabs (Loratadine) .... Once daily 3)  Proctozone-hc 2.5 % Crea (Hydrocortisone) .... Use as directed as needed. 4)  Hydrocortisone Acetate 25 Mg Supp (Hydrocortisone acetate) .... One supp two times a day for 2 weeks 5)  Losartan Potassium 50 Mg Tabs (Losartan potassium) .... One by mouth once daily 6)  Cyanocobalamin 1000 Mcg/ml Soln (Cyanocobalamin) .... One ml im weekly x 4 weeks, then monthly.  Hypertension Assessment/Plan:      The patient's hypertensive risk  group is category C: Target organ damage and/or diabetes.  Today's blood pressure is 160/100.    Patient Instructions: 1)  Please schedule a follow-up appointment in 1 month.  2)  Limit your Sodium(salt) .  Prescriptions: LOSARTAN POTASSIUM 50 MG TABS (LOSARTAN POTASSIUM) one by mouth once daily  #30 x 6   Entered and Authorized by:   Evelena Peat MD   Signed by:   Evelena Peat MD on 11/02/2009   Method used:   Electronically to        [facility]  [phone]* (retail)       [address]       [place], [place]  [address]       Ph: [phone] or [phone]       Fax: [phone]   RxID:   [id number]    Orders Added: 1)  Est. Patient Level III [36644] 2)  Prescription Created  Electronically [phone]

## 2010-02-07 NOTE — Progress Notes (Signed)
Summary: Med refill Triamcinolone  Phone Note Call from Patient   Caller: Patient Reason for Call: Talk to Nurse Summary of Call: Patient requesting refill on Triamcinolone Acetonide Cream from [facility] pharmacy on [address] Initial call taken by: Everrett Coombe,  September 21, 2009 9:20 AM    Prescriptions: TRIAMCINOLONE ACETONIDE 0.1 % CREA (TRIAMCINOLONE ACETONIDE) use as directed two times a day as needed  #30 gm x 2   Entered by:   Sid Falcon LPN   Authorized by:   Evelena Peat MD   Signed by:   Sid Falcon LPN on 16/10/9602   Method used:   Electronically to        [facility]  [phone]* (retail)       [address]       [place], [place]  [address]       Ph: [phone] or [phone]       Fax: [phone]   RxID:   [id number]

## 2010-02-07 NOTE — Assessment & Plan Note (Signed)
Summary: severe knee pain/some swelling/cjr   Vital Signs:  Patient profile:   77 year old female Menstrual status:  hysterectomy Temp:     99.3 degrees F oral BP sitting:   160 / 86  (left arm) Cuff size:   regular  Vitals Entered By: Sid Falcon LPN (February 11, 2009 9:51 AM) CC: severe R > L knee pain, swelling, Headache   History of Present Illness: Patient presents with multiple somatic complaints. Right knee pain no better and now complaining of left knee pain as well. Recent steroid injection did not help much. Complains of diffuse aching throughout her trunk region especially mid and upper back. Complains of some nonspecific bilateral diffuse headaches off and on. No visual changes. Denies any fever or chills. Has been intolerant of multiple medications for pain in the past including tramadol, codeine, and hydrocodone.  Generalized fatigue.  Denies depression.  Sleep OK.  Not exercising past couple of weeks.  Headache HPI:      The headaches will last anywhere from 30 minutes to several days at a time.        The location of the headaches are bilateral.  Headache quality is pressure or tightness.        The patient denies first or worst H/A of life, fever, weight loss, scalp tenderness, focal neurologic symptoms, confusion, seizures, and impaired level of consciousness.         Preventive Screening-Counseling & Management  Alcohol-Tobacco     Smoking Status: never  Allergies: 1)  ! Penicillin 2)  ! Septra 3)  ! Codeine 4)  Macrobid (Nitrofurantoin Monohyd Macro)  Past History:  Past Medical History: Last updated: 01/27/2009 Hypertension Urinary Tract Infection Congestive Heart Failure 2005 ?etiology Arthritis Endometriosis Pneumonia 2005 Diverticultitis 2006 SBO, adhesions 2010 ?Osteopenia  Past Surgical History: Last updated: 01/27/2009 Sigmoid colectomy for diverticulitis 2006 TAHBSO 1966 Appendectomy 1951 Hysterectomy, 1934 Tonsillectomy,  1966 PMH-FH-SH reviewed for relevance  Review of Systems  The patient denies anorexia, fever, weight loss, vision loss, chest pain, syncope, dyspnea on exertion, peripheral edema, abdominal pain, difficulty walking, and depression.    Physical Exam  General:  patient is alert and cooperative but somewhat anxious appearance Head:  no temporal artery tenderness Eyes:  pupils equal, pupils round, and pupils reactive to light.   Ears:  External ear exam shows no significant lesions or deformities.  Otoscopic examination reveals clear canals, tympanic membranes are intact bilaterally without bulging, retraction, inflammation or discharge. Hearing is grossly normal bilaterally. Mouth:  Oral mucosa and oropharynx without lesions or exudates.  Teeth in good repair. Neck:  No deformities, masses, or tenderness noted. Lungs:  Normal respiratory effort, chest expands symmetrically. Lungs are clear to auscultation, no crackles or wheezes. Heart:  normal rate and regular rhythm.   Msk:  no reproducible point tenderness Extremities:  no edema. Right and left knees are examined. No erythema or warts. Mild crepitus with flexion and extension. No significant effusion.   Impression & Recommendations:  Problem # 1:  BACK PAIN (ICD-724.5) pains very diffuse.  Doubt PMR but check ESR. Orders: Venipuncture (54098) TLB-Sedimentation Rate (ESR) (85652-ESR)  Problem # 2:  OSTEOARTHRITIS, KNEE, RIGHT (ICD-715.96) discussed appropriate exercises.  Problem # 3:  FATIGUE (ICD-780.79) check labs as below. Orders: Venipuncture (11914) TLB-CBC Platelet - w/Differential (85025-CBCD) TLB-TSH (Thyroid Stimulating Hormone) (84443-TSH) TLB-BMP (Basic Metabolic Panel-BMET) (80048-METABOL)  Problem # 4:  HEADACHE (ICD-784.0) ?tension type vs sinus.  No clinical suspicion of temporal arterits such as visual changes,  temp. art tenderness etc.  Complete Medication List: 1)  Multivitamins Tabs (Multiple vitamin)  .... Once daily, hold 2)  Prevacid 30 Mg Cpdr (Lansoprazole) .... As needed 3)  Calcium 600/vitamin D 600-400 Mg-unit Tabs (Calcium carbonate-vitamin d) .... Once daily, on hold 4)  Loratadine 10 Mg Tabs (Loratadine) .... Once daily 5)  Proctozone-hc 2.5 % Crea (Hydrocortisone) .... As needed.  Patient Instructions: 1)  Please schedule a follow-up appointment in 2 weeks.  2)  Try over-the-counter Aleve twice daily for osteoarthritis pains

## 2010-02-07 NOTE — Assessment & Plan Note (Signed)
Summary: B12 INJ // RS  Nurse Visit   Allergies: 1)  ! Penicillin 2)  ! Septra 3)  ! Codeine 4)  Macrobid (Nitrofurantoin Monohyd Macro)  Medication Administration  Injection # 1:    Medication: Vit B12 1000 mcg    Diagnosis: FATIGUE (ICD-780.79)    Route: IM    Site: R deltoid    Exp Date: 07/09/2011    Lot #: [id number]    Mfr: [employer]    Patient tolerated injection without complications    Given by: Sid Falcon LPN (October 25, 2009 12:23 PM)  Orders Added: 1)  Vit B12 1000 mcg [J3420] 2)  Admin of Therapeutic Inj  intramuscular or subcutaneous [16109]

## 2010-02-09 NOTE — Assessment & Plan Note (Signed)
Summary: 2 MTH ROV // RS   Vital Signs:  Patient profile:   77 year old female Menstrual status:  hysterectomy Weight:      155 pounds Temp:     98.4 degrees F oral BP sitting:   128 / 80  (left arm) Cuff size:   regular  Vitals Entered By: Sid Falcon LPN (February 01, 2010 8:19 AM)  History of Present Illness: Patient here for followup multiple items.  Hypertension treated with losartan. Blood pressure is very well-controlled. No orthostasis. No side effects from medication.  She continues to have some chronic perianal irritation. Triamcinolone cream helps somewhat. She has chronically had about 2-3 bowel movements per day. Denies diarrhea. Recent colonoscopy unremarkable.  Intermittent GERD symptoms. Usually relieved with TUMS. Uses Prevacid intermittently. No appetite or weight loss.  Hypertension History:      She denies headache, chest pain, palpitations, dyspnea with exertion, orthopnea, peripheral edema, visual symptoms, neurologic problems, syncope, and side effects from treatment.  She notes no problems with any antihypertensive medication side effects.        Positive major cardiovascular risk factors include female age 18 years old or older and hypertension.  Negative major cardiovascular risk factors include non-tobacco-user status.        Positive history for target organ damage include cardiac end organ damage (either CHF or LVH).     Allergies: 1)  ! Penicillin 2)  ! Septra 3)  ! Codeine 4)  Macrobid (Nitrofurantoin Monohyd Macro)  Past History:  Past Medical History: Last updated: 01/27/2009 Hypertension Urinary Tract Infection Congestive Heart Failure 2005 ?etiology Arthritis Endometriosis Pneumonia 2005 Diverticultitis 2006 SBO, adhesions 2010 ?Osteopenia  Past Surgical History: Last updated: 01/27/2009 Sigmoid colectomy for diverticulitis 2006 TAHBSO 1966 Appendectomy 1951 Hysterectomy, 1934 Tonsillectomy, 1966  Family History: Last  updated: 01/27/2009 No FH of Colon Cancer: Family History of Colitis/Crohn's:Father Family History of Arthritis Heart disease, mother Stroke, mother Family History Hypertension, mother Diabebts  Social History: Last updated: 05/05/2008 Patient has never smoked.  Alcohol Use - no Illicit Drug Use - no Occupation: Retired  Risk Factors: Smoking Status: never (02/11/2009) PMH-FH-SH reviewed for relevance  Review of Systems  The patient denies anorexia, fever, weight loss, weight gain, vision loss, decreased hearing, chest pain, syncope, dyspnea on exertion, peripheral edema, prolonged cough, headaches, hemoptysis, abdominal pain, melena, hematochezia, severe indigestion/heartburn, incontinence, muscle weakness, suspicious skin lesions, difficulty walking, depression, unusual weight change, abnormal bleeding, enlarged lymph nodes, and breast masses.    Physical Exam  General:  Well-developed,well-nourished,in no acute distress; alert,appropriate and cooperative throughout examination Ears:  External ear exam shows no significant lesions or deformities.  Otoscopic examination reveals clear canals, tympanic membranes are intact bilaterally without bulging, retraction, inflammation or discharge. Hearing is grossly normal bilaterally. Mouth:  Oral mucosa and oropharynx without lesions or exudates.  Teeth in good repair. Neck:  No deformities, masses, or tenderness noted. Lungs:  Normal respiratory effort, chest expands symmetrically. Lungs are clear to auscultation, no crackles or wheezes. Heart:  normal rate and regular rhythm.   Rectal:  very inflammed ext hemorroid with no thrombosis or active bleed. Extremities:  No clubbing, cyanosis, edema, or deformity noted with normal full range of motion of all joints.   Neurologic:  alert & oriented X3 and cranial nerves II-XII intact.   Cervical Nodes:  No lymphadenopathy noted Psych:  normally interactive, good eye contact, and not anxious  appearing.     Impression & Recommendations:  Problem # 1:  ESSENTIAL HYPERTENSION (  ICD-401.9) Assessment Improved  Her updated medication list for this problem includes:    Losartan Potassium 50 Mg Tabs (Losartan potassium) ..... One by mouth once daily  Problem # 2:  EXTERNAL HEMORRHOIDS WITHOUT MENTION COMP (ICD-455.3) proctofoam HC to use as needed .  Sitz baths.  fiber 25-30 gms daily.  Problem # 3:  GERD (ICD-530.81) cont OTC meds for now.  Problem # 4:  B12 DEFICIENCY (ICD-266.2) B12 given .Marland Kitchen Discusses possible oral therapy but she is reluctant to stop IM replacement. Orders: Admin of Therapeutic Inj  intramuscular or subcutaneous (11914) Vit B12 1000 mcg (J3420)  Complete Medication List: 1)  Multivitamins Tabs (Multiple vitamin) .... Once daily, hold 2)  Loratadine 10 Mg Tabs (Loratadine) .... Once daily 3)  Proctozone-hc 2.5 % Crea (Hydrocortisone) .... Use as directed as needed. 4)  Hydrocortisone Acetate 25 Mg Supp (Hydrocortisone acetate) .... One supp two times a day for 2 weeks 5)  Losartan Potassium 50 Mg Tabs (Losartan potassium) .... One by mouth once daily 6)  Cyanocobalamin 1000 Mcg/ml Soln (Cyanocobalamin) .... One ml im weekly x 4 weeks, then monthly. 7)  Triamcinolone Acetonide 0.1 % Crea (Triamcinolone acetonide) .... Use on affected area as needed 8)  Proctofoam Hc 1-1 % Foam (Hydrocortisone ace-pramoxine) .... Apply to affected area 2-3 times daily as needed  Hypertension Assessment/Plan:      The patient's hypertensive risk group is category C: Target organ damage and/or diabetes.  Today's blood pressure is 128/80.    Patient Instructions: 1)  Please schedule a follow-up appointment in 3 months .  2)  It is important that you exercise reguarly at least 20 minutes 5 times a week. If you develop chest pain, have severe difficulty breathing, or feel very tired, stop exercising immediately and seek medical attention.  Prescriptions: PROCTOFOAM HC 1-1 %  FOAM (HYDROCORTISONE ACE-PRAMOXINE) apply to affected area 2-3 times daily as needed  #10 g x 2   Entered and Authorized by:   Evelena Peat MD   Signed by:   Evelena Peat MD on 02/01/2010   Method used:   Electronically to        [facility]  [phone]* (retail)       [address]       [place], [place]  [address]       Ph: [phone] or [phone]       Fax: [phone]   RxID:   [id number]    Medication Administration  Injection # 1:    Medication: Vit B12 1000 mcg    Diagnosis: B12 DEFICIENCY (ICD-266.2)    Route: IM    Site: L deltoid    Exp Date: 08/09/2011    Lot #: [id number]    Mfr: [employer]    Patient tolerated injection without complications    Given by: Sid Falcon LPN (February 01, 2010 9:23 AM)  Orders Added: 1)  Admin of Therapeutic Inj  intramuscular or subcutaneous [96372] 2)  Vit B12 1000 mcg [J3420] 3)  Est. Patient Level IV [51884]

## 2010-02-09 NOTE — Progress Notes (Signed)
  Phone Note Call from Patient   Caller: Patient Call For: Evelena Peat MD Summary of Call: Pt is taking Losartan and is having GERD symptoms .   Shoud she change? Nicolette Bang [place] Initial call taken by: Lynann Beaver CMA AAMA,  January 04, 2010 11:17 AM  Follow-up for Phone Call        NO.  This would be VERY unlikely side effect. Follow-up by: Evelena Peat MD,  January 04, 2010 1:15 PM  Additional Follow-up for Phone Call Additional follow up Details #1::        Pt. notified. Additional Follow-up by: Lynann Beaver CMA AAMA,  January 04, 2010 1:18 PM

## 2010-02-09 NOTE — Assessment & Plan Note (Signed)
Summary: B12 INJ/CB  Nurse Visit   Allergies: 1)  ! Penicillin 2)  ! Septra 3)  ! Codeine 4)  Macrobid (Nitrofurantoin Monohyd Macro)  Medication Administration  Injection # 1:    Medication: Vit B12 1000 mcg    Diagnosis: B12 DEFICIENCY (ICD-266.2)    Route: IM    Site: L deltoid    Exp Date: 07/09/2011    Lot #: [id number]    Mfr: [employer]    Patient tolerated injection without complications    Given by: Sid Falcon LPN (December 22, 2009 1:17 PM)  Orders Added: 1)  Vit B12 1000 mcg [J3420] 2)  Admin of Therapeutic Inj  intramuscular or subcutaneous [11914]

## 2010-02-15 ENCOUNTER — Encounter: Payer: Self-pay | Admitting: Family Medicine

## 2010-02-15 ENCOUNTER — Ambulatory Visit (INDEPENDENT_AMBULATORY_CARE_PROVIDER_SITE_OTHER): Payer: 59 | Admitting: Family Medicine

## 2010-02-15 DIAGNOSIS — K648 Other hemorrhoids: Secondary | ICD-10-CM

## 2010-02-15 DIAGNOSIS — I10 Essential (primary) hypertension: Secondary | ICD-10-CM

## 2010-02-15 DIAGNOSIS — K921 Melena: Secondary | ICD-10-CM

## 2010-02-15 MED ORDER — HYDROCORTISONE ACETATE 25 MG RE SUPP: 25.0000 mg | Freq: Two times a day (BID) | RECTAL | Status: DC

## 2010-02-15 NOTE — Progress Notes (Signed)
  Subjective:    Patient ID: [name], female    DOB: [birth year], 76 y.o.   MRN: [id number]  HPI  Patient is seen with some recurrent bleeding from rectum. She described this as dark red and occurred 2 days ago after using some tucks pads. Minimal perianal discomfort. She has had prior history of internal hemorrhoids. Also some external hemorrhoids. Colonoscopy May of 2010 with benign polyp. Previous partial colectomy secondary to severe diverticulitis. At baseline, usually has about 3  non- diarrhea stools per day. No recent appetite or weight change.   history of mildly elevated blood pressure but not currently on any medication. Denies recent headaches or dizziness.  Walks regularly and very conscious of sodium intake.  Review of Systems     Denies appetite or weight change. No headaches, chest pain, dyspnea, abdominal pain, nausea , vomiting, diarrhea, or constipation. Objective:   Physical Exam  patient is alert and in no distress. Oropharynx is clear. Neck no mass  Chest clear to auscultation  Heart regular rhythm and rate with normal S1 and S2  Abdomen is soft and nontender with no masses. No guarding or rebound. Normal bowel sounds.  Extremities no edema  Rectal exam- patient has some minimally inflamed internal hemorrhoids but no active bleeding. Endoscopy performed. No rectal mass. No visible fissure.       Assessment & Plan:   #1   Hematochezia- probably secondary to mild inflamed internal hemorrhoids  #2   Elevated blood pressure   get back on hydrocortisone suppository . Continue high fiber diet. GI referral if symptoms persist

## 2010-02-15 NOTE — Progress Notes (Signed)
Summary: Proctofoam not helping  Phone Note Call from Patient Call back at Home Phone [phone]   Caller: Patient Call For: Evelena Peat MD Summary of Call: Used Proctofoam with no relief, and is very expensive.  Would like a different medication. Nicolette Bang ([place]) Initial call taken by: Lynann Beaver CMA AAMA,  February 06, 2010 9:25 AM  Follow-up for Phone Call        She has not had any success with steroids.  If unable to afford Proctofoam, she can try Tucks pads which provide some topical anesthetic relief.   Follow-up by: Evelena Peat MD,  February 07, 2010 2:23 PM  Additional Follow-up for Phone Call Additional follow up Details #1::        LMTCB

## 2010-02-17 ENCOUNTER — Telehealth: Payer: Self-pay | Admitting: Family Medicine

## 2010-02-17 DIAGNOSIS — K648 Other hemorrhoids: Secondary | ICD-10-CM

## 2010-02-17 MED ORDER — HYDROCORTISONE ACETATE 25 MG RE SUPP: 25.0000 mg | Freq: Two times a day (BID) | RECTAL | Status: DC

## 2010-02-17 NOTE — Telephone Encounter (Signed)
Rx sent to correct pharmacy in [place], pt informed

## 2010-02-27 ENCOUNTER — Encounter: Payer: Self-pay | Admitting: Family Medicine

## 2010-02-27 ENCOUNTER — Ambulatory Visit (INDEPENDENT_AMBULATORY_CARE_PROVIDER_SITE_OTHER): Payer: 59 | Admitting: Family Medicine

## 2010-02-27 VITALS — BP 130/78 | Temp 98.7°F | Ht 65.25 in | Wt 155.0 lb

## 2010-02-27 DIAGNOSIS — K644 Residual hemorrhoidal skin tags: Secondary | ICD-10-CM

## 2010-02-27 DIAGNOSIS — E538 Deficiency of other specified B group vitamins: Secondary | ICD-10-CM

## 2010-02-27 DIAGNOSIS — I10 Essential (primary) hypertension: Secondary | ICD-10-CM

## 2010-02-27 MED ORDER — CYANOCOBALAMIN 1000 MCG/ML IJ SOLN
1000.0000 ug | Freq: Once | INTRAMUSCULAR | Status: AC
  Administered 2010-02-27: 1000 ug via INTRAMUSCULAR

## 2010-02-27 NOTE — Patient Instructions (Signed)
Continue high fiber diet and plenty of fluids. Consider ice to hemorrhoids.

## 2010-02-27 NOTE — Progress Notes (Signed)
  Subjective:    Patient ID: [name], female    DOB: 1926/11/07, 77 y.o.   MRN: [id number]  HPI  Patient seen for followup.   hypertension treated with losartan 50 mg daily. Patient compliant with therapy. No headaches or dizziness. Brings a log of several blood pressure readings taken from outside of these are well-controlled.   History of chronic intermittent external hemorrhoids. Currently using hydrocortisone suppositories and topical hydrocortisone with mild relief. She does warm sitz baths. Never got any icing. Intermittent bleeding. Prior history of sigmoid colectomy secondary to acute diverticulitis   Review of Systems  patient denies any Or weight changes. Denies chest pain, shortness of breath, abdominal pain, constipation, diarrhea, or dysuria    Objective:   Physical Exam  patient is alert and healthy appearance.  oropharynx is clear Neck supple no adenopathy Chest clear to auscultation Heart regular rhythm and rate       Assessment & Plan:   #1 hypertension stable at goal continue current medication #2 external hemorrhoids. Patient is encouraged to continue high-fiber diet, continue topical cortisone and suppositories and try icing.

## 2010-02-27 NOTE — Progress Notes (Deleted)
  Subjective:    Patient ID: [name], female    DOB: 25-Sep-1926, 77 y.o.   MRN: [id number]  HPI  Patient seen for followup type 1 diabetes and frequent leg cramps.    several months ago was placed empirically on potassium supplement. The patient had metabolic panel done. Please have some intermittent leg cramps. Drinks plenty of fluids. No history of hypokalemia. Takes lisinopril HCTZ.  Type 1 diabetes a low-dose insulin with Lantus 18 units daily. He has been checking blood sugars twice daily for the past few weeks and these are reviewed. Most of his blood sugars range between 70 and 130 with occasional high around 230. No symptoms of hyperglycemia. Only one episode of low blood sugar of 45 but asymptomatic. He does not use in the [place] short-acting insulin   Review of Systems  patient denies any headaches, fever, chills, chest pain, increased dyspnea, prolonged cough    Objective:   Physical Exam  patient is alert and thin in no distress Oropharynx is clear Neck no adenopathy Chest clear to auscultation Heart regular rhythm and rate Extremities no edema       Assessment & Plan:   #1 leg cramps. Check basic metabolic panel.  #2 type 1 diabetes recent A1c 7.5%. Discussed insulin. This point reluctant to increase the dosage. Reassess A1c 3-4 months. Patient reluctant to use short acting diltiazem insulin this time

## 2010-03-24 ENCOUNTER — Encounter: Payer: Self-pay | Admitting: Family Medicine

## 2010-03-24 ENCOUNTER — Ambulatory Visit (INDEPENDENT_AMBULATORY_CARE_PROVIDER_SITE_OTHER): Payer: 59 | Admitting: Family Medicine

## 2010-03-24 VITALS — BP 140/80 | Temp 99.0°F | Wt 153.0 lb

## 2010-03-24 DIAGNOSIS — M67919 Unspecified disorder of synovium and tendon, unspecified shoulder: Secondary | ICD-10-CM

## 2010-03-24 DIAGNOSIS — I10 Essential (primary) hypertension: Secondary | ICD-10-CM

## 2010-03-24 DIAGNOSIS — K648 Other hemorrhoids: Secondary | ICD-10-CM

## 2010-03-24 DIAGNOSIS — M778 Other enthesopathies, not elsewhere classified: Secondary | ICD-10-CM

## 2010-03-24 DIAGNOSIS — E538 Deficiency of other specified B group vitamins: Secondary | ICD-10-CM

## 2010-03-24 DIAGNOSIS — K644 Residual hemorrhoidal skin tags: Secondary | ICD-10-CM

## 2010-03-24 MED ORDER — CYANOCOBALAMIN 1000 MCG/ML IJ SOLN
1000.0000 ug | Freq: Once | INTRAMUSCULAR | Status: AC
  Administered 2010-03-24: 1000 ug via INTRAMUSCULAR

## 2010-03-24 MED ORDER — HYDROCORTISONE ACETATE 25 MG RE SUPP: 25.0000 mg | Freq: Two times a day (BID) | RECTAL | Status: DC

## 2010-03-24 NOTE — Progress Notes (Signed)
  Subjective:    Patient ID: [name], female    DOB: July 06, 1926, 77 y.o.   MRN: [id number]  HPI  Patient seen with one month history of right shoulder pain. No injury. Pain is achy quality. Radiates toward the deltoid. Worse with internal rotation and abduction. Starting to limit daily activities. No relief with over-the-counter medications. No neck pain. No definite weakness. Does not recall history of similar problem.  Symptoms of moderate severity.  Patient has history of hemorrhoids and is requesting refill of HC suppositories.  Her hemorrhoids are relatively stable at this time.  Hypertension treated with Losartan.  No orthostasis, dizziness, or chest pain.  No dyspnea. Compliant with meds.   Review of Systems As per HPI    Objective:   Physical Exam     Patient alert and in no distress. Neck supple no adenopathy. Full range of motion in neck. No spinal tenderness. Chest clear auscultation Heart regular rhythm and rate Right shoulder reveals no edema erythema or warmth. Limited range of motion with internal rotation and pain with abduction  against resistance. Can abduct about 80-90. No bicipital tenderness. No a.c. Joint tenderness    Assessment & Plan:  R shoulder rotator cuff tendinitis. Discussed risks and benefits of corticosteroid injection and patient consented.  After prepping skin with betadine, injected 40 mg depomedrol and 1 cc of plain xylocaine with 23 gauge one and one half inch needle using posterior lateral approach and pt tolerate well.

## 2010-03-24 NOTE — Patient Instructions (Signed)
Rotator Cuff Tendonitis (Tendinitis, Tenosynovitis) The rotator cuff is the collection of all the muscles and tendons (the supraspinatus, infraspinatus, subscapularis, and teres minor muscles and their tendons) that help your shoulder stay in place. This unit holds the head of the upper arm bone (humerus) in the cup (fossa) of the shoulder blade (scapula). Basically, it connects the arm to the shoulder. Tendinitis is a swelling and irritation of the tissue, called cord like structures (tendons) that connect muscle to bone. It usually is caused by overusing the joint involved. When the tissue surrounding a tendon (the synovium) becomes inflamed, it is called tenosynovitis. This also is often the result of overuse in people whose jobs require repetitive (over and over again) types of motion. HOME CARE INSTRUCTIONS  Use a sling or splint for  (not  Recommended) until the pain decreases, or as suggested by your caregiver.   Apply ice to the injury for 15 minutes, three times per day. Put the ice in a plastic bag and place a towel between the bag of ice and your skin.   Try to avoid use other than gentle range of motion while your shoulder is painful. Use and exercise only as directed by your caregiver. Stop exercises or range of motion if pain or discomfort increases, unless directed otherwise by your caregiver.   Only take over-the-counter or prescription medicines for pain, discomfort, or fever as directed by your caregiver.   If you were give a shoulder sling and straps (immobilizer), do not remove it except as directed, or until you see a caregiver for a follow-up examination. If you need to remove it, move your arm as little as possible or as directed.   You may want to sleep on several pillows at night to lessen swelling and pain.  SEEK IMMEDIATE MEDICAL CARE IF:  Pain in your shoulder increases or new pain develops in your arm, hand, or fingers and is not relieved with medications.   You  develop new, unexplained symptoms, especially increased numbness in the hands or loss of strength, or you develop any worsening of the problems which brought you in for care.   Your arm, hand, or fingers are numb or tingling.   Your arm, hand, or fingers are swollen, painful, or turn white or blue.  Document Released: 03/17/2003 Document Re-Released: 03/23/2008 [facility] Patient Information 2011 [place], [place].

## 2010-03-25 ENCOUNTER — Encounter: Payer: Self-pay | Admitting: Family Medicine

## 2010-03-29 ENCOUNTER — Telehealth: Payer: Self-pay | Admitting: *Deleted

## 2010-03-29 DIAGNOSIS — M79603 Pain in arm, unspecified: Secondary | ICD-10-CM

## 2010-03-29 NOTE — Telephone Encounter (Signed)
Would go ahead and get X-ray of R shoulder as next step (plain films).

## 2010-03-29 NOTE — Telephone Encounter (Signed)
Still having arm pain and wants Dr. Caryl Never to let her know the next steps.

## 2010-03-30 NOTE — Telephone Encounter (Signed)
Addended by: Sid Falcon on: 03/30/2010 09:26 AM   Modules accepted: Orders

## 2010-03-30 NOTE — Telephone Encounter (Signed)
Order sent, pt informed on home VM

## 2010-03-31 ENCOUNTER — Encounter: Payer: Self-pay | Admitting: Family Medicine

## 2010-03-31 ENCOUNTER — Ambulatory Visit (INDEPENDENT_AMBULATORY_CARE_PROVIDER_SITE_OTHER): Payer: 59 | Admitting: Family Medicine

## 2010-03-31 DIAGNOSIS — I10 Essential (primary) hypertension: Secondary | ICD-10-CM

## 2010-03-31 DIAGNOSIS — K644 Residual hemorrhoidal skin tags: Secondary | ICD-10-CM

## 2010-03-31 DIAGNOSIS — M25519 Pain in unspecified shoulder: Secondary | ICD-10-CM

## 2010-03-31 NOTE — Patient Instructions (Signed)
We will call you regarding physical therapy appointment 

## 2010-03-31 NOTE — Progress Notes (Signed)
  Subjective:    Patient ID: [name], female    DOB: March 26, 1926, 77 y.o.   MRN: [id number]  HPI  patient seen today for the following issues.  Ongoing right shoulder pain. Somewhat poorly localized. No history of injury. Recent corticosteroid injection without improvement. She does some weight lifting with upper extremities. Pain is achy quality and moderate severity. Occurs both day and night. No neck pain. Pain worse with abduction and internal rotation. No definite weakness.   Hypertension treated with Cozaar 50 mg. No side effects.    patient has history of external hemorrhoids. These have improved recently with steroid suppositories. No change in bowel habits. No appetite or weight changes.   Review of Systems  Constitutional: Negative for fever, chills, appetite change, fatigue and unexpected weight change.  Eyes: Negative for visual disturbance.  Respiratory: Negative for cough and shortness of breath.   Cardiovascular: Negative for chest pain, palpitations and leg swelling.  Gastrointestinal: Negative for vomiting, abdominal pain, diarrhea and constipation.  Genitourinary: Negative for dysuria.  Musculoskeletal: Negative for gait problem.  Skin: Negative for rash.  Neurological: Negative for headaches.  Hematological: Negative for adenopathy.  Psychiatric/Behavioral: Negative for dysphoric mood.       Objective:   Physical Exam  patient is alert pleasant and in no distress Oropharynx is clear Neck supple no mass Chest clear to auscultation Heart regular rhythm and rate Extremities right shoulder reveals fairly good range of motion. She has pain with abduction and internal rotation. No bicipital tenderness.  rectal exam. She has some mildly inflamed external hemorrhoids. No thrombosis. No active bleeding. Overall these are less inflamed compared with previous       Assessment & Plan:   #1 ongoing right shoulder pain. Obtain plain x-rays. Consider physical  therapy and we'll set up.  #2 hypertension slightly elevated today.  Monitor with home readings. Reassess her within 2 months #3 external hemorrhoids which are improved

## 2010-04-02 ENCOUNTER — Encounter: Payer: Self-pay | Admitting: Family Medicine

## 2010-04-20 LAB — GLUCOSE, CAPILLARY
Glucose-Capillary: 106 mg/dL — ABNORMAL HIGH (ref 70–99)
Glucose-Capillary: 111 mg/dL — ABNORMAL HIGH (ref 70–99)
Glucose-Capillary: 122 mg/dL — ABNORMAL HIGH (ref 70–99)
Glucose-Capillary: 127 mg/dL — ABNORMAL HIGH (ref 70–99)
Glucose-Capillary: 127 mg/dL — ABNORMAL HIGH (ref 70–99)
Glucose-Capillary: 134 mg/dL — ABNORMAL HIGH (ref 70–99)
Glucose-Capillary: 134 mg/dL — ABNORMAL HIGH (ref 70–99)
Glucose-Capillary: 137 mg/dL — ABNORMAL HIGH (ref 70–99)
Glucose-Capillary: 141 mg/dL — ABNORMAL HIGH (ref 70–99)
Glucose-Capillary: 145 mg/dL — ABNORMAL HIGH (ref 70–99)
Glucose-Capillary: 115 mg/dL — ABNORMAL HIGH (ref 70–99)
Glucose-Capillary: 87 mg/dL (ref 70–99)

## 2010-04-20 LAB — BASIC METABOLIC PANEL
BUN: 7 mg/dL (ref 6–23)
BUN: 13 mg/dL (ref 6–23)
BUN: 16 mg/dL (ref 6–23)
CO2: 27 mEq/L (ref 19–32)
CO2: 30 mEq/L (ref 19–32)
CO2: 26 mEq/L (ref 19–32)
Calcium: 8.7 mg/dL (ref 8.4–10.5)
Calcium: 8.5 mg/dL (ref 8.4–10.5)
Chloride: 97 mEq/L (ref 96–112)
Chloride: 99 mEq/L (ref 96–112)
Chloride: 103 mEq/L (ref 96–112)
Chloride: 107 mEq/L (ref 96–112)
Creatinine, Ser: 0.78 mg/dL (ref 0.4–1.2)
Creatinine, Ser: 0.87 mg/dL (ref 0.4–1.2)
Creatinine, Ser: 0.8 mg/dL (ref 0.4–1.2)
Creatinine, Ser: 0.8 mg/dL (ref 0.4–1.2)
GFR calc Af Amer: >60 mL/min (ref >60)
GFR calc Af Amer: >60 mL/min (ref >60)
GFR calc non Af Amer: >60 mL/min (ref >60)
Glucose, Bld: 122 mg/dL — ABNORMAL HIGH (ref 70–99)
Glucose, Bld: 133 mg/dL — ABNORMAL HIGH (ref 70–99)
Glucose, Bld: 95 mg/dL (ref 70–99)
Potassium: 3.7 mEq/L (ref 3.5–5.1)

## 2010-04-20 LAB — URINALYSIS, ROUTINE W REFLEX MICROSCOPIC
Glucose, UA: NEGATIVE mg/dL
Specific Gravity, Urine: 1.022 (ref 1.005–1.030)
Urobilinogen, UA: 0.2 mg/dL (ref 0.0–1.0)

## 2010-04-20 LAB — COMPREHENSIVE METABOLIC PANEL
ALT: 15 U/L (ref 0–35)
ALT: 20 U/L (ref 0–35)
ALT: 21 U/L (ref 0–35)
AST: 19 U/L (ref 0–37)
AST: 20 U/L (ref 0–37)
Albumin: 2.5 g/dL — ABNORMAL LOW (ref 3.5–5.2)
Albumin: 2.8 g/dL — ABNORMAL LOW (ref 3.5–5.2)
BUN: 10 mg/dL (ref 6–23)
BUN: 5 mg/dL — ABNORMAL LOW (ref 6–23)
BUN: 5 mg/dL — ABNORMAL LOW (ref 6–23)
CO2: 29 mEq/L (ref 19–32)
CO2: 22 mEq/L (ref 19–32)
Calcium: 8.3 mg/dL — ABNORMAL LOW (ref 8.4–10.5)
Calcium: 8.7 mg/dL (ref 8.4–10.5)
Calcium: 8 mg/dL — ABNORMAL LOW (ref 8.4–10.5)
Calcium: 8.1 mg/dL — ABNORMAL LOW (ref 8.4–10.5)
Calcium: 8.2 mg/dL — ABNORMAL LOW (ref 8.4–10.5)
Chloride: 104 mEq/L (ref 96–112)
Creatinine, Ser: 0.78 mg/dL (ref 0.4–1.2)
Creatinine, Ser: 0.79 mg/dL (ref 0.4–1.2)
Creatinine, Ser: 0.82 mg/dL (ref 0.4–1.2)
Creatinine, Ser: 0.85 mg/dL (ref 0.4–1.2)
Creatinine, Ser: 0.86 mg/dL (ref 0.4–1.2)
GFR calc Af Amer: >60 mL/min (ref >60)
GFR calc Af Amer: >60 mL/min (ref >60)
GFR calc non Af Amer: >60 mL/min (ref >60)
GFR calc non Af Amer: >60 mL/min (ref >60)
GFR calc non Af Amer: >60 mL/min (ref >60)
Glucose, Bld: 108 mg/dL — ABNORMAL HIGH (ref 70–99)
Glucose, Bld: 135 mg/dL — ABNORMAL HIGH (ref 70–99)
Glucose, Bld: 121 mg/dL — ABNORMAL HIGH (ref 70–99)
Sodium: 136 mEq/L (ref 135–145)
Sodium: 135 mEq/L (ref 135–145)
Sodium: 138 mEq/L (ref 135–145)
Total Bilirubin: 1 mg/dL (ref 0.3–1.2)
Total Protein: 6 g/dL (ref 6.0–8.3)
Total Protein: 5.5 g/dL — ABNORMAL LOW (ref 6.0–8.3)
Total Protein: 6.1 g/dL (ref 6.0–8.3)

## 2010-04-20 LAB — CBC
HCT: 31.1 % — ABNORMAL LOW (ref 36.0–46.0)
HCT: 33.1 % — ABNORMAL LOW (ref 36.0–46.0)
HCT: 34.7 % — ABNORMAL LOW (ref 36.0–46.0)
HCT: 35.2 % — ABNORMAL LOW (ref 36.0–46.0)
Hemoglobin: 10.6 g/dL — ABNORMAL LOW (ref 12.0–15.0)
Hemoglobin: 11.8 g/dL — ABNORMAL LOW (ref 12.0–15.0)
Hemoglobin: 12.3 g/dL (ref 12.0–15.0)
MCHC: 33.6 g/dL (ref 30.0–36.0)
MCHC: 34 g/dL (ref 30.0–36.0)
MCHC: 34 g/dL (ref 30.0–36.0)
MCHC: 34.5 g/dL (ref 30.0–36.0)
MCHC: 34.8 g/dL (ref 30.0–36.0)
MCV: 98.3 fL (ref 78.0–100.0)
MCV: 96.9 fL (ref 78.0–100.0)
MCV: 97.5 fL (ref 78.0–100.0)
MCV: 97.9 fL (ref 78.0–100.0)
MCV: 98.8 fL (ref 78.0–100.0)
MCV: 99 fL (ref 78.0–100.0)
Platelets: 109 10*3/uL — ABNORMAL LOW (ref 150–400)
Platelets: 114 10*3/uL — ABNORMAL LOW (ref 150–400)
Platelets: 115 10*3/uL — ABNORMAL LOW (ref 150–400)
Platelets: 132 10*3/uL — ABNORMAL LOW (ref 150–400)
RBC: 3.16 MIL/uL — ABNORMAL LOW (ref 3.87–5.11)
RBC: 3.61 MIL/uL — ABNORMAL LOW (ref 3.87–5.11)
RBC: 3.87 MIL/uL (ref 3.87–5.11)
RDW: 12.1 % (ref 11.5–15.5)
RDW: 12.9 % (ref 11.5–15.5)
RDW: 12.8 % (ref 11.5–15.5)
RDW: 12.8 % (ref 11.5–15.5)
WBC: 8.6 10*3/uL (ref 4.0–10.5)

## 2010-04-20 LAB — HEMOGLOBIN A1C
Hgb A1c MFr Bld: 5.6 % (ref 4.6–6.1)
Mean Plasma Glucose: 114 mg/dL

## 2010-04-20 LAB — MAGNESIUM
Magnesium: 1.9 mg/dL (ref 1.5–2.5)
Magnesium: 1.9 mg/dL (ref 1.5–2.5)

## 2010-04-20 LAB — APTT: aPTT: 46 seconds — ABNORMAL HIGH (ref 24–37)

## 2010-04-20 LAB — PROTIME-INR
INR: 1 (ref 0.00–1.49)
Prothrombin Time: 13.7 seconds (ref 11.6–15.2)

## 2010-04-20 LAB — PREALBUMIN
Prealbumin: 13.9 mg/dL — ABNORMAL LOW (ref 18.0–45.0)
Prealbumin: 8.7 mg/dL — ABNORMAL LOW (ref 18.0–45.0)

## 2010-04-20 LAB — URINE MICROSCOPIC-ADD ON

## 2010-04-20 LAB — DIFFERENTIAL
Eosinophils Absolute: 0.3 10*3/uL (ref 0.0–0.7)
Eosinophils Absolute: 0 10*3/uL (ref 0.0–0.7)
Eosinophils Relative: 0 % (ref 0–5)
Lymphocytes Relative: 17 % (ref 12–46)
Lymphocytes Relative: 9 % — ABNORMAL LOW (ref 12–46)
Lymphs Abs: 1 10*3/uL (ref 0.7–4.0)
Lymphs Abs: 0.8 10*3/uL (ref 0.7–4.0)
Monocytes Absolute: 0.4 10*3/uL (ref 0.1–1.0)
Monocytes Relative: 4 % (ref 3–12)
Neutro Abs: 4.2 10*3/uL (ref 1.7–7.7)
Neutrophils Relative %: 68 % (ref 43–77)

## 2010-04-20 LAB — PHOSPHORUS: Phosphorus: 4 mg/dL (ref 2.3–4.6)

## 2010-04-20 LAB — MANGANESE: Manganese, Blood: <1 (ref 0.0–2.0)

## 2010-04-20 LAB — TRIGLYCERIDES: Triglycerides: 98 mg/dL (ref <150)

## 2010-04-24 ENCOUNTER — Ambulatory Visit (INDEPENDENT_AMBULATORY_CARE_PROVIDER_SITE_OTHER): Payer: 59 | Admitting: Family Medicine

## 2010-04-24 ENCOUNTER — Encounter: Payer: Self-pay | Admitting: Family Medicine

## 2010-04-24 ENCOUNTER — Ambulatory Visit: Payer: 59 | Admitting: Family Medicine

## 2010-04-24 VITALS — BP 130/86 | Temp 99.3°F | Wt 149.0 lb

## 2010-04-24 DIAGNOSIS — M79609 Pain in unspecified limb: Secondary | ICD-10-CM

## 2010-04-24 DIAGNOSIS — M79603 Pain in arm, unspecified: Secondary | ICD-10-CM

## 2010-04-24 NOTE — Progress Notes (Signed)
  Subjective:    Patient ID: [name], female    DOB: November 07, 1926, 77 y.o.   MRN: [id number]  HPI Patient is in with right arm pain. Refer to prior notes. She initially presented with what seemed to be more of a shoulder problem and was injected with corticosteroid which did not seem to help much. She now complains of more mid arm pain. No injury. Does lift weights. Pain is dull to sharp mid humerus. No radiculopathy symptoms. No neck pain. No numbness or weakness. No overlying skin changes. No visible swelling or redness. No ecchymosis. Pain is increased with direct palpation. No alleviating factors.   Review of Systems  Constitutional: Negative for fever, chills, appetite change and unexpected weight change.  Cardiovascular: Negative for chest pain.      she denies appetite or weight changes. No neck pain. No numbness or weakness. Objective:   Physical Exam  Constitutional: She appears well-developed and well-nourished.  Cardiovascular: Normal rate, regular rhythm and normal heart sounds.   Pulmonary/Chest: Effort normal and breath sounds normal. No respiratory distress. She has no wheezes. She has no rales.  Musculoskeletal:       Right arm refills no visible swelling. No erythema or ecchymosis. Range of motion elbow and shoulder. She has some direct tenderness on the mid aspect of the humerus. No bicipital tenderness.  Neurological:       Full-strength S. testing of the biceps and triceps muscles. Normal sensory function upper extremity          Assessment & Plan:  Right mid humerus arm pain of 3 month duration. Plain films to rule out any abnormality

## 2010-05-03 ENCOUNTER — Ambulatory Visit: Payer: Self-pay | Admitting: Family Medicine

## 2010-05-05 ENCOUNTER — Ambulatory Visit (INDEPENDENT_AMBULATORY_CARE_PROVIDER_SITE_OTHER)
Admission: RE | Admit: 2010-05-05 | Discharge: 2010-05-05 | Disposition: A | Discharge status: Home / Self Care | Payer: 59 | Source: Ambulatory Visit | Attending: Family Medicine | Admitting: Family Medicine

## 2010-05-05 DIAGNOSIS — M79603 Pain in arm, unspecified: Secondary | ICD-10-CM

## 2010-05-05 DIAGNOSIS — M79609 Pain in unspecified limb: Secondary | ICD-10-CM

## 2010-05-09 NOTE — Progress Notes (Signed)
Quick Note:  Pt informed ______ 

## 2010-05-15 ENCOUNTER — Ambulatory Visit (INDEPENDENT_AMBULATORY_CARE_PROVIDER_SITE_OTHER): Payer: 59 | Admitting: Family Medicine

## 2010-05-15 ENCOUNTER — Encounter: Payer: Self-pay | Admitting: Family Medicine

## 2010-05-15 VITALS — BP 140/80 | Temp 98.8°F | Wt 150.0 lb

## 2010-05-15 DIAGNOSIS — I10 Essential (primary) hypertension: Secondary | ICD-10-CM

## 2010-05-15 DIAGNOSIS — K644 Residual hemorrhoidal skin tags: Secondary | ICD-10-CM

## 2010-05-15 DIAGNOSIS — E538 Deficiency of other specified B group vitamins: Secondary | ICD-10-CM

## 2010-05-15 DIAGNOSIS — M79609 Pain in unspecified limb: Secondary | ICD-10-CM

## 2010-05-15 DIAGNOSIS — M79603 Pain in arm, unspecified: Secondary | ICD-10-CM

## 2010-05-15 MED ORDER — CYANOCOBALAMIN 1000 MCG/ML IJ SOLN
1000.0000 ug | Freq: Once | INTRAMUSCULAR | Status: AC
  Administered 2010-05-15: 1000 ug via INTRAMUSCULAR

## 2010-05-15 NOTE — Progress Notes (Signed)
  Subjective:    Patient ID: [name], female    DOB: 15-Jun-1926, 77 y.o.   MRN: [id number]  HPI Patient seen with persistent right arm pain. Refer to prior notes. More arm and shoulder involvement. Burning quality at times. No neck pain. No weakness. Continues to exercise regularly. No visible swelling. Recent humerus films unremarkable. She has not seen any skin rashes. No obvious weakness. Pain is moderate and intermittent. No alleviating factors. No clear exacerbating factors.  She has a long history of hemorrhoids and perianal and rectal burning and itching. She's tried multiple things including topical steroids and steroid suppositories without relief. She has about 2 stools per day, generally formed. No diarrhea. She had partial colectomy back in 2006 for what was likely complications of diverticulitis. She's had some problems since then. Had colonoscopy May 2010 with benign polyp removed. No recent change in bowel habits.  She is requesting change of gastroenterologist.  Hypertension and meds reviewed and she is compliant with all.  No orthostatic side effects.  No edema.  Review of Systems  Constitutional: Negative for fever, chills, activity change, appetite change and unexpected weight change.  Respiratory: Negative for shortness of breath and wheezing.   Cardiovascular: Negative for chest pain, palpitations and leg swelling.  Gastrointestinal: Negative for nausea, vomiting, abdominal pain, blood in stool and abdominal distention.  Genitourinary: Negative for dysuria.  Musculoskeletal: Negative for back pain.  Skin: Negative for rash.  Neurological: Negative for dizziness, weakness and headaches.  Hematological: Negative for adenopathy.       Objective:   Physical Exam  Constitutional: She is oriented to person, place, and time. She appears well-developed and well-nourished.  HENT:  Head: Normocephalic and atraumatic.  Right Ear: External ear normal.  Left Ear: External  ear normal.  Mouth/Throat: Oropharynx is clear and moist.  Neck: Neck supple. No thyromegaly present.  Cardiovascular: Normal rate, regular rhythm and normal heart sounds.   Pulmonary/Chest: Effort normal and breath sounds normal. No respiratory distress. She has no wheezes. She has no rales.  Abdominal: Soft. She exhibits no mass. There is no tenderness.  Musculoskeletal: She exhibits no edema.       R shoulder full ROM with excellent strength.  No biceps tenderness.  Somewhat tender along mid aspect of humerus.    Lymphadenopathy:    She has no cervical adenopathy.  Neurological: She is alert and oriented to person, place, and time.  Psychiatric: She has a normal mood and affect.          Assessment & Plan:  #1 right arm pain. This is less likely rotator cuff and more mid aspect of arm. With burning quality questionable neuropathic. No evidence for weakness. Discussed options including gabapentin this point she wishes to wait. Also discussed consideration for orthopedic referral but she refuses at this time #2 chronic perianal and perirectal discomfort with history of external hemorrhoids. She is willing to see gastroenterologist and requests different group and we'll try to set this up.

## 2010-05-15 NOTE — Patient Instructions (Signed)
We will call you regarding appointment with gastroenterologist.

## 2010-05-22 ENCOUNTER — Telehealth: Payer: Self-pay | Admitting: Cardiovascular Disease

## 2010-05-22 NOTE — Telephone Encounter (Signed)
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 Alfonso Ramus RN

## 2010-05-22 NOTE — Telephone Encounter (Signed)
Return call, last thur had chest pain, sob, sweating plus nausea and lasted . Not had this pain before. Not been feeling herself lately. Nausea off and on, has app in July. App moved up.Alfonso Ramus RN

## 2010-05-22 NOTE — Telephone Encounter (Signed)
PT RECENTLY HAD AN "EPISODE" AND WANTS TO TELL THE NURSE ABOUT IT. CHART IN BOX.

## 2010-05-23 ENCOUNTER — Encounter: Payer: Self-pay | Admitting: *Deleted

## 2010-05-23 NOTE — Consult Note (Signed)
[name], [name]            ACCOUNT NO.:  [id number]   MEDICAL RECORD NO.:  [id number]          PATIENT TYPE:  INP   LOCATION:  1345                         FACILITY[facility]  [facility]   PHYSICIAN:  Lennie Muckle, MD      DATE OF BIRTH:  1926/07/28   DATE OF CONSULTATION:  03/25/2008  DATE OF DISCHARGE:                                 CONSULTATION   REASON FOR CONSULTATION:  Evaluation for bowel obstruction.   CHIEF COMPLAINT/HISTORY OF PRESENT ILLNESS:  [name] is a pleasant  77 year old female who was apparently admitted on March 11.  She  describes having hurt her back during an exercise regimen at [employer].  She had taken Flexeril.  Soon after that, she had acute onset of nausea  and vomiting.  She had also had some abdominal pain.  In the emergency  department, she had a KUB, which showed some air fluid levels.  There  was a small amount of gas in the colon and rectosigmoid.  She was  admitted for small bowel obstruction.  NG tube was placed.  She has  continued to have some episodes of crampy abdominal pain, more so on the  right side of the abdomen.  She had some mild nausea, but no emesis.  She is passing flatus and having bowel movements.  A CT scan of the  abdomen and pelvis was done on March 14.  There was some dilation of the  small intestine.  Contrast did go through the previous anastamosis of a  right colectomy.  There was some thickening in the small bowel up to the  anastomotic site.  Subsequent KUB revealed no dilation of the small  intestines.  She states her pain is somewhat better.  She has been  started on TPN and has been on clear liquids without problems.  She does  state she has some mild abdominal distension.   PAST MEDICAL HISTORY:  Significant for congestive heart failure,  hypertension, previous colectomy, hysterectomy.   MEDICATIONS AT HOME:  Include Lasix, Flexeril, Coreg, enalapril,  multivitamin, and nystatin.   SOCIAL HISTORY:  She recently moved  from [place] in July 2009.  She used  to work at [employer] and is currently looking for another job.  She has a  son nearby.  No tobacco or alcohol use.   FAMILY HISTORY:  Negative.   REVIEW OF SYSTEMS:  She has had some problems with urination and has  been better when she has been on the Lasix.  She does not have any  significant chest pain, discomfort, palpitations.  Other review of  systems are negative.   PHYSICAL EXAM:  CONSTITUTIONAL:  She is a pleasant elderly female who  appears younger than her stated age.  VITAL SIGNS:  Temperature 98.4, blood pressure 142/85, room air  saturation is 94%.  HEENT:  Normocephalic.  Sclerae clear.  Oral mucosa moist.  CHEST:  Clear to auscultation bilaterally.  CARDIOVASCULAR:  Regular rate and rhythm.  ABDOMEN:  Mildly distended in the lower abdomen.  Midline incisional  scar.  No hernia.  Right lower incisional scar and left lower  incisional  scar without hernia.  She has some mild pain to palpation in the right  side of the abdomen, but no peritoneal signs.  MUSCULOSKELETAL:  No deformities or edema are noted.  SKIN:  No rashes are seen.  She has some mild spider veins on the lower  extremity.   LABORATORY DATA:  White count today is normal at 5.6, hemoglobin and  hematocrit 11 and 35.  Serum chemistry with mild elevation of glucose to  108, albumin low at 3.  Otherwise, unremarkable.   ASSESSMENT AND PLAN:  Resolved likely small bowel obstruction.  I wonder  if this is not an incidental finding on her imaging.  She is somewhat  sensitive to medications.  She had a previous surgery and, on her CT,  there is some inflammation of the small intestine.  I wonder if she has  had a flare-up of her previous colitis.  I have asked gastroenterology  to evaluate to see if there is any recommendation for treatment of this.  Currently, there is no clinical indication for surgical procedure.  As  she is passing flatus and having bowel movements, I  think that her  obstruction has resolved.  She has been on clear liquids and has now  advanced to full liquids.  I have discontinued her NG tube to see how  she is doing on her clear liquids.  I have added Phenergan to her  antiemetic regimen, since she is sensitive to medications, and this will  help some of her nausea.  I encouraged her to ambulate as much as  possible.  She is fairly active, and therefore, I do not think this will  be a problem.  Will continue to follow.      Lennie Muckle, MD  Electronically Signed     ALA/MEDQ  D:  03/25/2008  T:  03/25/2008  Job:  [id number]

## 2010-05-23 NOTE — Discharge Summary (Signed)
[name], [name]            ACCOUNT NO.:  [id number]   MEDICAL RECORD NO.:  [id number]          PATIENT TYPE:  INP   LOCATION:  1345                         FACILITY:  [facility]   PHYSICIAN:  Monte Fantasia, MD  DATE OF BIRTH:  [birth year]   DATE OF ADMISSION:  03/18/2008  DATE OF DISCHARGE:                               DISCHARGE SUMMARY   PRIMARY CARE PHYSICIAN:  Maryelizabeth Rowan, M.D.   CONSULTATIONS:  1. [place] [facility].  2. Surgery, Dr. Bertram Savin with the [facility].   DISCHARGE DIAGNOSES:  1. Partial small bowel obstruction, resolved.  2. Moderate protein energy malnutrition.  3. Hypertension.  4. Urinary tract infection.  5. Oral thrush, which has resolved.   MEDICATIONS UPON DISCHARGE:  1. Coreg 3.125 mg p.o. b.i.d.  2. Enalapril 10 mg p.o. daily.  3. Multivitamin 1 tablet p.o. daily.  4. Nystatin mouth wash 4 times a day for 1 week.  5. Prevacid 30 mg p.o. daily.  6. Phenergan 25 mg p.o. q.6 h. p.r.n. nausea.  7. Tramadol 50 mg p.o. q.6 h. p.r.n. pain.   [name] is a 77 year old Caucasian lady patient who recently moved  home from [place].  The patient was admitted on March 18, 2008 with  complaints of abdominal pain, nausea and vomiting.  The patient, on  admission, had an x-ray of the abdomen done in the emergency room in  view of the same, which showed a partial small bowel obstruction and the  patient was given a conservative line of management during the stay in  the hospital.  The patient had an NG tube placed and was monitored  closely with serial abdominal x-rays.  A CT scan of the abdomen and  pelvis was done on March 21, 2008 which showed some dilatation of the  small intestine.  The contrast did go through the previous anastomosis  of the right colectomy and there was thickening of the small bowel up to  the anastomotic site.  The subsequent KUBs revealed no evidence of any  obstruction of the small bowel or small  intestine, and the abdominal  pain has had improved well.  The patient was gradually started on clear  liquids for the same.  The patient also had evaluation with surgery in  view of the small bowel obstruction and had recommended for a GI  evaluation in view of small bowel wall thickening for possible flare of  her previous colitis.  The patient also had a GI evaluation of the same  and underwent CT enterography of the of the abdomen and pelvis, which  showed resolution of the wall thickening involving the distal ileum.  No  signs of active disease involving the bowel.  A CT of the pelvis showed  resolution of the small bowel obstruction and resolution of the mild  ascites seen in the prior study.  As per the GI evaluation, the patient  did not need to have a colonoscopy for the same and it is okay to be  discharged from the GI standpoint.   In view of the protein energy malnutrition as per the nutritional  evaluation and the patient being on n.p.o. in view of the small bowel  obstruction, and the patient was started on TNA .  The patient received  TNA during the stay in the hospital.  At present the TNA will be  discontinued and the patient will be given a low-residue diet for the  same and will need to follow up with the primary care physician for the  same.   In view of the hypertension, the patient's blood pressure was controlled  with IV medications.  We will switch her back to her oral medications  for the blood pressure control.   Regarding the urinary tract infection, the patient was started on  ciprofloxacin.  The patient has received a full course of ciprofloxacin  during the stay in the hospital.  At present the patient has no  leukocytosis and the antibiotics have been discontinued.   RADIOLOGICAL INVESTIGATIONS DONE DURING THE STAY IN THE HOSPITAL:  Abdominal x-ray done on March 18, 2008.  Impression:  Findings  consistent with small bowel obstruction.  Abdominal x-ray  done on March 20, 2008, nonobstructive bowel gas pattern  evident after NG tube placement into fundus of the stomach.  CT scan of the abdomen and pelvis with contrast, findings most  consistent with partial small bowel obstruction.  Obstruction point  appears to be in the right lower quadrant in the region of the enteric  anastomosis.  Proximal to this anastomosis is a long segment of  circumferential mural thickening in the distal small bowel which either  represents antritis, ischemia, or inflammatory bowel disease, bilateral  pleural effusion and passive atelectasis, moderate intraperitoneal free  air, moderate intraperitoneal free fluid, no free air.  CT pelvis impression:  Small volume of free fluid in the pelvis likely  to be related to partial small bowel obstruction as described.  Abdominal x-ray done on March 22, 2008,  slightly continued prominence  of central small bowel loops.  Chest x-ray done on March 22, 2008, right PICC line tip in the  cavoatrial junction.  Abdominal x-ray done on March 24, 2008, decrease in the small bowel  dilatation.  Abdominal x-ray done on March 25, 2008, scattered contrast in the colon,  no dilated loops of the small bowel, and/or free air.  CT enterography of the abdomen and pelvis.  Impression:  Resolution of  the wall thickening involving the distal ileum, resolution of small  bowel obstruction and ascites since the prior studies.  No acute  findings.  A tiny right pleural effusion nearly completely resolved  since the prior study.  CT of the pelvis:  Resolution of the small bowel obstruction, mild  ascites since the prior study, and no acute findings or active disease  within the pelvis.   LABS DONE DURING THE STAY IN THE HOSPITAL:  Total WBC 5.6, hemoglobin  11.8, hematocrit 35.1, platelets of 162.  Sodium 131, potassium 4.5,  chloride 99, bicarb is 27, glucose 122, BUN 17, creatinine 0.87.  Total  bilirubin 0.4, AST 31, ALT 20, alkaline  phosphatase 41, total protein  6.0, albumin 3.0, calcium of 8.7, phosphorus 4.0, magnesium 1.9.  Total  cholesterol, triglycerides 98, prealbumin 13.9.   DISPOSITION:  The patient is medically stable to be discharged.  Will  discontinue TNA today and the patient is started on a low-residue diet.  The patient is recommended to follow up with her primary care physician  in the next 1-2 weeks.   On examination today, vitals:  Temperature of  98.7, pulse 76,  respirations 20, blood pressure 117/70, oxygen saturation 100% on room  air.  HEENT EXAMINATION:  Pupils equal, reacting to light.  No pallor, no  lymphadenopathy.  Neck is supple.  RESPIRATORY EXAMINATION:  Air entry is bilaterally equal.  No rales, no  rhonchi.  CARDIOVASCULAR EXAMINATION:  S1, S2.  Regular rate and rhythm.  Her abdomen is soft.  No distention.  No tenderness.  No guarding.  No  rigidity.  EXTREMITIES:  No edema of feet.      Monte Fantasia, MD  Electronically Signed     MP/MEDQ  D:  03/26/2008  T:  03/26/2008  Job:  [id number]   cc:   Maryelizabeth Rowan, M.D.  Fax: [phone]

## 2010-05-23 NOTE — H&P (Signed)
[name], [name] NO.:  [id number]   MEDICAL RECORD NO.:  [id number]          PATIENT TYPE:  EMS   LOCATION:  ED                           FACILITY:  [facility]   PHYSICIAN:  Peggye Pitt, M.D. DATE OF BIRTH:  18-Jun-1926   DATE OF ADMISSION:  03/18/2008  DATE OF DISCHARGE:                              HISTORY & PHYSICAL   PATIENT'S PRIMARY CARE:  Dr. Maryelizabeth [name].   CHIEF COMPLAINT:  Abdominal pain, nausea and vomiting.   HISTORY OF PRESENT ILLNESS:  [name] is a very pleasant and active  77 year old Caucasian woman who recently moved down here from [place].  She has of medical history that is significant for a left hemicolectomy  following repeated episodes of diverticulitis with a colostomy that was  later reversed.  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  She  has tried using hot compresses and over-the-counter pain medications  with no relief.  Apparently, she went to see her primary care physician,  [name], who suggested that she used Flexeril 5 mg once a day to help  relieve her muscle spasms.  She filled this prescription and took her  per first pill last night at about 6:00 p.m.  Ten minutes later, she had  an episode of acute nausea and vomiting that continued throughout the  night.  Because she had no relief and the nausea continued, she decided  to come into the emergency department to seek further evaluation and  management.  Of note, she does not have any fevers or chills.  No  dysuria, no headache or any other symptoms.  She does have some mild  upper abdominal pain that extends a band over her left upper, right  upper and epigastric areas.  She had an x-ray in the emergency  department that showed a small bowel obstruction and, hence, the  hospitalist service was called to admit her for further evaluation and  management.   ALLERGIES:  1. PENICILLIN.  2. SEPTRA.  3. CODEINE.   PAST MEDICAL HISTORY:  1. Hypertension.  2. Questionable history of CHF.  3. Multiple abdominal surgeries, including a left hemicolectomy in      2006, and a hysterectomy at age 34.  4. Oral thrush.   HOME MEDICATIONS:  1. Lasix 10 mg daily.  2. Flexeril 5 mg daily.  3. Coreg 3.125 mg twice daily.  4. Enalapril 10 mg daily.  5. Multivitamin 1 tablet daily.  6. Nystatin mouthwash four times a day.   SOCIAL HISTORY:  [name] is not married.  She recently moved down  from [place].  She lives by herself.  She denies any alcohol, tobacco or  illicit drug use.   FAMILY HISTORY:  Noncontributory.   REVIEW OF SYSTEMS:  Negative except as already mentioned on HPI.   PHYSICAL EXAMINATION:  VITAL SIGNS:  Upon admission, blood pressure  153/90, heart rate 77, respirations 20.  O2 saturations are 97% on room  air with a temperature of 98.2.  GENERAL:  She is alert,  awake and oriented x3, very pleasant.  She does  not appear to be in any acute distress.  HEENT:  Normocephalic, atraumatic.  Her pupils are equally reactive to  light and accommodation with intact extraocular movements.  NECK:  Supple with no JVD, no lymphadenopathy, no bruits or goiter.  LUNGS:  Clear to auscultation bilaterally.  HEART:  Regular rate and rhythm with no murmurs, rubs or gallops  auscultated.  ABDOMEN:  Soft, tender to palpation to the mid epigastric region.  She  has high-pitched bowel sounds.  No organomegaly noted.  EXTREMITIES:  She has no edema with positive pulses.  NEUROLOGIC:  Grossly intact and nonfocal.   LABORATORY DATA UPON ADMISSION:  Show a sodium of 135, potassium 4.0,  chloride 104, bicarb 22, BUN 20, creatinine 0.85 with a glucose of 121.  All of her LFTs are within normal limits.  WBCs are 8.6, hemoglobin 12.3  and a platelet count of 132.  Acute abdominal series shows a small bowel  obstruction.   ASSESSMENT AND PLAN:  1. For her small bowel obstruction,  we will proceed with bowel rest.      We will place an nasogastric tube for decompression.  My concern at      this point is for adhesions given her multiple abdominal surgeries.      We will repeat her acute abdominal series in the morning.  If she      has had no improvement, she may require a surgical consultation for      lysis of adhesions.  2. For her hypertension.  Given that she will be n.p.o., we will hold      all of her home medications and we will start her on a p.r.n. beta-      blocker.  3. History of congestive heart failure.  The patient cannot give me      any more details on this, except to tell me that she is on Coreg.      For this reason, we will get a 2-D echo to further evaluate.  4. For prophylaxis while in the hospital, place on Protonix for      gastrointestinal prophylaxis and on Lovenox for deep venous      thrombosis prophylaxis.  5. For her nausea, we will give her antiemetics as needed.      Peggye Pitt, M.D.  Electronically Signed     EH/MEDQ  D:  03/18/2008  T:  03/18/2008  Job:  [id number]   cc:   [name], M.D.  Fax: [phone]

## 2010-05-24 ENCOUNTER — Encounter: Payer: Self-pay | Admitting: Cardiovascular Disease

## 2010-05-24 ENCOUNTER — Ambulatory Visit (INDEPENDENT_AMBULATORY_CARE_PROVIDER_SITE_OTHER): Payer: 59 | Admitting: Cardiovascular Disease

## 2010-05-24 DIAGNOSIS — R079 Chest pain, unspecified: Secondary | ICD-10-CM | POA: Insufficient documentation

## 2010-05-24 NOTE — Assessment & Plan Note (Signed)
Her BP is mildly elevated.  Have encouraged her to eat a low salt diet and continue her losartan.

## 2010-05-24 NOTE — Progress Notes (Signed)
[name] Date of Birth  01-20-1926 [facility] [address]     [address] [place], [place]  [address] [phone]  Fax  [phone]  History of Present Illness:  Last week , had an episode of weakness, chest tightness, nausea, near syncope.  Lasted 5 minutes. Occurred while she was going to the bathroom.  No assoc with exertion.  Seems to be associated with bowel movements.  Never with exercise.  Walks regularly - has never had any episodes of cp. Goes to [employer] 3 times a week.  Current Outpatient Prescriptions on File Prior to Visit  Medication Sig Dispense Refill  . cyanocobalamin (,VITAMIN B-12,) 1000 MCG/ML injection Inject 1,000 mcg into the muscle once.        . loratadine (CLARITIN) 10 MG tablet Take 10 mg by mouth daily.        Marland Kitchen losartan (COZAAR) 50 MG tablet Take 50 mg by mouth daily.        . Multiple Vitamin (MULTIVITAMIN) tablet Take 1 tablet by mouth daily.        Marland Kitchen DISCONTD: hydrocortisone (ANUSOL-HC) 25 MG suppository Place 1 suppository (25 mg total) rectally 2 (two) times daily.  60 suppository  0  . DISCONTD: hydrocortisone (PROCTOZONE-HC) 2.5 % rectal cream Place rectally 2 (two) times daily. As needed       . DISCONTD: triamcinolone (KENALOG) 0.1 % cream Apply topically. Use on affected area as needed       . DISCONTD: vitamin B-12 (CYANOCOBALAMIN) 1000 MCG tablet Take 1,000 mcg by mouth daily.          Allergies  Allergen Reactions  . Bactrim   . Codeine   . Macrobid   . Nitrofurantoin   . Penicillins   . Sulfamethoxazole W/Trimethoprim     Past Medical History  Diagnosis Date  . B12 DEFICIENCY 11/25/2009  . ESSENTIAL HYPERTENSION 09/30/2009  . CHF 01/27/2009  . SMALL BOWEL OBSTRUCTION 05/05/2008  . DIVERTICULITIS OF COLON 01/27/2009  . OSTEOARTHRITIS, KNEE, RIGHT 02/08/2009  . OSTEOPENIA 01/27/2009  . Left ventricular dysfunction     history of  . Hypertension   . History of echocardiogram 07/25/2009   EF=55-60% normal left ventricular systolic function, mild aortic sclerosis, mild mitral regurgitation      Past Surgical History  Procedure Date  . Colon surgery 2006    Sigmoid colectomy for diverticulitis  . Abdominal hysterectomy 1996    TAHBSO  . Appendectomy 1951  . Tonsillectomy 1966    History  Smoking status  . Never Smoker   Smokeless tobacco  . Never Used    History  Alcohol Use No    Family History  Problem Relation Age of Onset  . Heart disease    . Emphysema      Reviw of Systems:  Reviewed in the HPI.  All other systems are negative.  Physical Exam: BP 152/98  Pulse 70  Ht 5\' 6"  (1.676 m)  Wt 149 lb (67.586 kg)  BMI 24.05 kg/m2 The patient is alert and oriented x 3.  The mood and affect are normal.  The skin is warm and dry.  Color is normal.  The HEENT exam reveals that the sclera are nonicteric.  The mucous membranes are moist.  The carotids are 2+ without bruits.  There is no thyromegaly.  There is no JVD.  The lungs are clear.  The chest wall is non tender.  The heart exam reveals a regular rate with  a normal S1 and S2.  There are no murmurs, gallops, or rubs.  The PMI is not displaced.   Abdominal exam reveals good bowel sounds.  There is no guarding or rebound.  There is no hepatosplenomegaly or tenderness.  There are no masses.  Exam of the legs reveal no clubbing, cyanosis, or edema.  The legs are without rashes.  The distal pulses are intact.  Cranial nerves II - XII are intact.  Motor and sensory functions are intact.  The gait is normal.  ECG: NSR. LAD. No ST or T wave changes.  Assessment / Plan:

## 2010-05-24 NOTE — Assessment & Plan Note (Signed)
Pt presents with an episode of chest pain at rest.  Tightness, lasted 5 minutes.  History of HTN and a previous history of CHF ( although she has normal LV function).  Will schedule a stress myoview.  I'll see her in 6 months - sooner if myoview is positive.

## 2010-05-25 ENCOUNTER — Telehealth: Payer: Self-pay | Admitting: *Deleted

## 2010-05-25 NOTE — Telephone Encounter (Signed)
Yes.  I would proceed as recommended by cardiology.

## 2010-05-25 NOTE — Telephone Encounter (Signed)
Pt is scheduled for stress test and wants to know if Dr. Caryl Never wants her to have it??

## 2010-05-26 NOTE — Telephone Encounter (Signed)
No answer,  No voicemail 

## 2010-05-29 ENCOUNTER — Ambulatory Visit (HOSPITAL_COMMUNITY): Payer: Medicare HMO | Attending: Cardiovascular Disease | Admitting: Radiology

## 2010-05-29 DIAGNOSIS — R0789 Other chest pain: Secondary | ICD-10-CM

## 2010-05-29 DIAGNOSIS — I4949 Other premature depolarization: Secondary | ICD-10-CM

## 2010-05-29 DIAGNOSIS — R0602 Shortness of breath: Secondary | ICD-10-CM

## 2010-05-29 DIAGNOSIS — R079 Chest pain, unspecified: Secondary | ICD-10-CM | POA: Insufficient documentation

## 2010-05-29 MED ORDER — TECHNETIUM TC 99M TETROFOSMIN IV KIT
33.0000 | PACK | Freq: Once | INTRAVENOUS | Status: AC | PRN
  Administered 2010-05-29: 33 via INTRAVENOUS

## 2010-05-29 MED ORDER — TECHNETIUM TC 99M TETROFOSMIN IV KIT
11.0000 | PACK | Freq: Once | INTRAVENOUS | Status: AC | PRN
  Administered 2010-05-29: 11 via INTRAVENOUS

## 2010-05-29 NOTE — Progress Notes (Signed)
[facility] SITE 3 NUCLEAR MED [address] [place] [address] [phone]  Cardiology Nuclear Med Study  [name] is a 77 y.o. female [id number] 1926/03/16   Nuclear Med Background Indication for Stress Test:  Evaluation for Ischemia History: 2011 Echo: EF 55-60% and mild AS and mild MR, 07 GXT: normal in [place] Cardiac Risk Factors: Family History - CAD and Hypertension  Symptoms:  Chest Pain (last date of chest discomfort 1 1/2 weeks ago), Chest Tightness (last date of chest discomfort 1 1/2 weeks ago), Diaphoresis, Dizziness, DOE, Fatigue, Nausea, Palpitations and Vomiting   Nuclear Pre-Procedure Caffeine/Decaff Intake:  None NPO After: 5:30am   Lungs:  clear IV 0.9% NS with Angio Cath:  22g  IV Site: R Hand  IV Started by:  Bonnita Levan, RN  Chest Size (in):  36 Cup Size: A  Height: 5\' 6"  (1.676 m)  Weight:  147 lb (66.679 kg)  BMI:  Body mass index is 23.73 kg/(m^2). Tech Comments:  N/A    Nuclear Med Study 1 or 2 day study: 1 day  Stress Test Type:  Stress  Reading MD: Kristeen Miss, MD  Order Authorizing Provider:  Dr. Katherina Right  Resting Radionuclide: Technetium 62m Tetrofosmin  Resting Radionuclide Dose: 11 mCi   Stress Radionuclide:  Technetium 78m Tetrofosmin  Stress Radionuclide Dose: 33 mCi           Stress Protocol Rest HR: 74 Stress HR:148  Rest BP: 52/94 Stress BP: 36/94  Exercise Time (min): 6:01 METS: 7.0   Predicted Max HR: 139 bpm % Max HR: 106.47 bpm Rate Pressure Product: 53664   Dose of Adenosine (mg):  n/a Dose of Lexiscan: n/a mg  Dose of Atropine (mg): n/a Dose of Dobutamine: n/a mcg/kg/min (at max HR)  Stress Test Technologist: Cathlyn Parsons, RN  Nuclear Technologist:  Domenic Polite, CNMT     Rest Procedure:  Myocardial perfusion imaging was performed at rest 45 minutes following the intravenous administration of Technetium 38m Tetrofosmin. Rest ECG: NSR  Stress Procedure:  The patient exercised  for 6:01.  The patient stopped due to fatigue,SOB and nauseated and denied any chest pain.  There were no significant ST-T wave changes. Patient had frequent PVC's with stress test and in recovery. Technetium 62m Tetrofosmin was injected at peak exercise and myocardial perfusion imaging was performed after a brief delay. Stress ECG: No significant change from baseline ECG  QPSreRaw Data Images:  There is a breast shadow that accounts for the anterior attenuation. Stress Images:  There is very mild attenuation of the apical anterior lateral region. Rest Images:  There is very mild attenuation of the apical anterior lateral region. Subtraction (SDS):  No evidence of ischemia. Transient Ischemic Dilatation (Normal <1.22):  .98 Lung/Heart Ratio (Normal <0.45):  .34  Quantitative Gated Spect Images QGS EDV:  109 ml QGS ESV:  60 ml QGS cine images:  There is moderate hypokinesis of the inferior wall.  The apical anterior lateral well contractsl well. QGS EF: 45%  Impression Exercise Capacity:  Good exercise capacity. BP Response:  Normal blood pressure response. Clinical Symptoms:  No chest pain. ECG Impression:  No significant ST segment change suggestive of ischemia. Comparison with Prior Nuclear Study: No images to compare  Overall Impression:  Low risk stress nuclear study.  There is no evidence of ischemia.  There is mild fixed  attenuation of the apical lateral wall but this wall contracts well and this defect may be due to breast  artifact.  The LV function is mildly reduced with inferior hypokinesis.     Elyn Aquas., MD, [name]

## 2010-05-30 ENCOUNTER — Other Ambulatory Visit: Payer: Self-pay | Admitting: Family Medicine

## 2010-05-30 NOTE — Progress Notes (Signed)
Copy routed to DR. NAHSER.Mirna Mires

## 2010-06-15 ENCOUNTER — Ambulatory Visit: Payer: 59 | Admitting: Family Medicine

## 2010-06-19 ENCOUNTER — Encounter: Payer: Self-pay | Admitting: Family Medicine

## 2010-06-19 ENCOUNTER — Ambulatory Visit (INDEPENDENT_AMBULATORY_CARE_PROVIDER_SITE_OTHER): Payer: 59 | Admitting: Family Medicine

## 2010-06-19 VITALS — BP 130/80 | Temp 98.7°F | Wt 148.0 lb

## 2010-06-19 DIAGNOSIS — E538 Deficiency of other specified B group vitamins: Secondary | ICD-10-CM

## 2010-06-19 DIAGNOSIS — K644 Residual hemorrhoidal skin tags: Secondary | ICD-10-CM

## 2010-06-19 DIAGNOSIS — I10 Essential (primary) hypertension: Secondary | ICD-10-CM

## 2010-06-19 DIAGNOSIS — H3530 Unspecified macular degeneration: Secondary | ICD-10-CM | POA: Insufficient documentation

## 2010-06-19 MED ORDER — CYANOCOBALAMIN 1000 MCG/ML IJ SOLN
1000.0000 ug | Freq: Once | INTRAMUSCULAR | Status: AC
  Administered 2010-06-19: 1000 ug via INTRAMUSCULAR

## 2010-06-19 NOTE — Progress Notes (Signed)
  Subjective:    Patient ID: [name], female    DOB: [birth year], 77 y.o.   MRN: [id number]  HPI Patient in for medical followup. Generally doing well. Recent nuclear stress test unremarkable. No further chest pain since then.  She has some chronic problems with external hemorrhoids. Intermittent bleeding. Colonoscopy 2010. No significant constipation problems. She has been on multiple topicals without relief. She does not wish to see a gastroenterologist at this time. We discussed possible referral to Gen. surgery to see if they have anything to offer.  Hypertension treated with losartan. Blood pressure is very well controlled by home readings. No orthostasis.  Recent diagnosis of macular degeneration. Patient placed on multivitamin but having some associated dyspepsia.  History of B12 deficiency. Intramuscular placement monthly. Due for repeat injection today  Past Medical History  Diagnosis Date  . B12 DEFICIENCY 11/25/2009  . ESSENTIAL HYPERTENSION 09/30/2009  . CHF 01/27/2009  . SMALL BOWEL OBSTRUCTION 05/05/2008  . DIVERTICULITIS OF COLON 01/27/2009  . OSTEOARTHRITIS, KNEE, RIGHT 02/08/2009  . OSTEOPENIA 01/27/2009  . Left ventricular dysfunction     history of  . Hypertension   . History of echocardiogram 07/25/2009    EF=55-60% normal left ventricular systolic function, mild aortic sclerosis, mild mitral regurgitation     Past Surgical History  Procedure Date  . Colon surgery 2006    Sigmoid colectomy for diverticulitis  . Abdominal hysterectomy 1996    TAHBSO  . Appendectomy 1951  . Tonsillectomy 1966    reports that she has never smoked. She has never used smokeless tobacco. She reports that she does not drink alcohol or use illicit drugs. family history includes Emphysema in an unspecified family member and Heart disease in an unspecified family member. Allergies  Allergen Reactions  . Bactrim   . Codeine   . Macrobid   . Nitrofurantoin   . Penicillins   .  Sulfamethoxazole W/Trimethoprim       Review of Systems  Constitutional: Negative for fever, chills, appetite change and unexpected weight change.  HENT: Negative for trouble swallowing.   Respiratory: Negative for cough and shortness of breath.   Cardiovascular: Negative for chest pain, palpitations and leg swelling.  Gastrointestinal: Positive for anal bleeding. Negative for nausea, vomiting, abdominal pain and abdominal distention.  Genitourinary: Negative for dysuria.  Neurological: Negative for dizziness, syncope and headaches.  Psychiatric/Behavioral: Negative for dysphoric mood.       Objective:   Physical Exam  Constitutional: She is oriented to person, place, and time. She appears well-developed and well-nourished. No distress.  HENT:  Right Ear: External ear normal.  Left Ear: External ear normal.  Mouth/Throat: Oropharynx is clear and moist.  Cardiovascular: Normal rate and regular rhythm.   Pulmonary/Chest: Effort normal. No respiratory distress. She has no wheezes. She has no rales.  Abdominal: Soft. She exhibits no distension and no mass. There is no tenderness. There is no rebound and no guarding.  Musculoskeletal: She exhibits no edema.  Neurological: She is alert and oriented to person, place, and time.  Skin: No rash noted.  Psychiatric: She has a normal mood and affect.          Assessment & Plan:   #1 hypertension stable continue current medications #2 B12 deficiency. B12 1000 mcg IM today and continue monthly #3 history of chronic external hemorrhoids. Referral to general surgery for evaluation. She's tried many topicals without improvement #4 macular degeneration was recently diagnosed

## 2010-06-28 ENCOUNTER — Encounter (INDEPENDENT_AMBULATORY_CARE_PROVIDER_SITE_OTHER): Payer: Self-pay | Admitting: Surgery

## 2010-07-07 ENCOUNTER — Ambulatory Visit (INDEPENDENT_AMBULATORY_CARE_PROVIDER_SITE_OTHER): Payer: Self-pay | Admitting: Surgery

## 2010-07-07 ENCOUNTER — Encounter (INDEPENDENT_AMBULATORY_CARE_PROVIDER_SITE_OTHER): Payer: Self-pay | Admitting: Surgery

## 2010-07-07 DIAGNOSIS — Z711 Person with feared health complaint in whom no diagnosis is made: Secondary | ICD-10-CM

## 2010-07-18 NOTE — Progress Notes (Signed)
Subjective:     Patient ID: [name], female   DOB: 05/08/1926, 77 y.o.   MRN: [id number]    There were no vitals taken for this visit.    HPI  No show  Review of Systems    no show Objective:   Physical Exam    no show Assessment:    no show    Plan:    NO SHOW

## 2010-07-24 ENCOUNTER — Ambulatory Visit (INDEPENDENT_AMBULATORY_CARE_PROVIDER_SITE_OTHER): Payer: 59 | Admitting: Family Medicine

## 2010-07-24 DIAGNOSIS — E538 Deficiency of other specified B group vitamins: Secondary | ICD-10-CM

## 2010-07-24 MED ORDER — CYANOCOBALAMIN 1000 MCG/ML IJ SOLN
1000.0000 ug | Freq: Once | INTRAMUSCULAR | Status: AC
  Administered 2010-07-24: 1000 ug via INTRAMUSCULAR

## 2010-07-28 ENCOUNTER — Encounter (INDEPENDENT_AMBULATORY_CARE_PROVIDER_SITE_OTHER): Payer: Self-pay | Admitting: Surgery

## 2010-07-31 ENCOUNTER — Ambulatory Visit: Payer: 59 | Admitting: Cardiovascular Disease

## 2010-08-02 ENCOUNTER — Ambulatory Visit (INDEPENDENT_AMBULATORY_CARE_PROVIDER_SITE_OTHER): Payer: 59 | Admitting: Surgery

## 2010-08-10 ENCOUNTER — Encounter (INDEPENDENT_AMBULATORY_CARE_PROVIDER_SITE_OTHER): Payer: Self-pay | Admitting: Surgery

## 2010-08-14 ENCOUNTER — Ambulatory Visit (INDEPENDENT_AMBULATORY_CARE_PROVIDER_SITE_OTHER): Payer: Medicare HMO | Admitting: Surgery

## 2010-08-14 ENCOUNTER — Encounter (INDEPENDENT_AMBULATORY_CARE_PROVIDER_SITE_OTHER): Payer: Self-pay | Admitting: Surgery

## 2010-08-14 VITALS — BP 170/88 | HR 84 | Temp 97.2°F | Ht 66.5 in | Wt 148.8 lb

## 2010-08-14 DIAGNOSIS — K644 Residual hemorrhoidal skin tags: Secondary | ICD-10-CM

## 2010-08-14 NOTE — Progress Notes (Signed)
[name] is a 77 y.o. female.    Chief Complaint  Patient presents with  . Other    new pt- eval hems  . Rectal Bleeding    HPI HPI This is a very pleasant 77 year old female referred to me for evaluation of her hemorrhoids. She has had problems with external hemorrhoids for some time. She has both discomfort and bleeding. She has tried multiple creams without improvement. She has no issues with continence or control. She also has no problems with constipation or diarrhea  Past Medical History  Diagnosis Date  . B12 DEFICIENCY 11/25/2009  . ESSENTIAL HYPERTENSION 09/30/2009  . CHF 01/27/2009  . SMALL BOWEL OBSTRUCTION 05/05/2008  . DIVERTICULITIS OF COLON 01/27/2009  . OSTEOARTHRITIS, KNEE, RIGHT 02/08/2009  . OSTEOPENIA 01/27/2009  . Left ventricular dysfunction     history of  . Hypertension   . History of echocardiogram 07/25/2009    EF=55-60% normal left ventricular systolic function, mild aortic sclerosis, mild mitral regurgitation    . Hemorrhoids   . Rectal bleeding     Past Surgical History  Procedure Date  . Colon surgery 2006    Sigmoid colectomy for diverticulitis  . Abdominal hysterectomy 1996    TAHBSO  . Appendectomy 1951  . Tonsillectomy 1966    Family History  Problem Relation Age of Onset  . Heart disease    . Emphysema    . Heart disease Father   . Heart disease Sister     Social History History  Substance Use Topics  . Smoking status: Never Smoker   . Smokeless tobacco: Never Used  . Alcohol Use: No    Allergies  Allergen Reactions  . Bactrim   . Codeine   . Macrobid   . Nitrofurantoin   . Penicillins   . Sulfamethoxazole W/Trimethoprim     Current Outpatient Prescriptions  Medication Sig Dispense Refill  . cyanocobalamin (,VITAMIN B-12,) 1000 MCG/ML injection Inject 1,000 mcg into the muscle once.        . loratadine (CLARITIN) 10 MG tablet Take 10 mg by mouth daily.        Marland Kitchen losartan (COZAAR) 50 MG tablet TAKE ONE TABLET BY  MOUTH EVERY DAY  30 tablet  11  . Multiple Vitamin (MULTIVITAMIN) tablet Take 1 tablet by mouth daily.        . Multiple Vitamins-Minerals (ICAPS PO) Take by mouth.        . pramoxine-hydrocortisone (ANALPRAM HC) cream Apply topically as needed.        . triamcinolone (KENALOG) 0.1 % cream Apply 1 application topically 2 (two) times daily.          Review of Systems Review of Systems  Constitutional: Negative.   HENT: Negative.   Eyes: Negative.   Respiratory: Negative.   Cardiovascular: Negative.   Gastrointestinal: Positive for blood in stool.  Genitourinary: Negative.   Musculoskeletal: Negative.   Skin: Negative.   Neurological: Negative.   Endo/Heme/Allergies: Negative.   Psychiatric/Behavioral: Negative.     Physical Exam Physical Exam  Constitutional: She appears well-developed and well-nourished. No distress.  HENT:  Head: Normocephalic and atraumatic.  Right Ear: External ear normal.  Left Ear: External ear normal.  Nose: Nose normal.  Mouth/Throat: Oropharynx is clear and moist. No oropharyngeal exudate.  Eyes: EOM are normal. Pupils are equal, round, and reactive to light. No scleral icterus.  Neck: Normal range of motion. Neck supple. No tracheal deviation present. No thyromegaly present.  Cardiovascular: Normal rate, regular rhythm, normal  heart sounds and intact distal pulses.   No murmur heard. Respiratory: Effort normal and breath sounds normal. No respiratory distress. She has no wheezes.  GI: Soft. Bowel sounds are normal. She exhibits no distension and no mass. There is no tenderness.  Skin: Skin is dry. No rash noted. No pallor.  Psychiatric: Her behavior is normal. Thought content normal.   Rectal exam and anoscopy was performed. The patient has 2 areas of excoriated skin with external hemorrhoids. The internal digital exam was normal with normal tone. Anoscopy was also normal  Blood pressure 170/88, pulse 84, temperature 97.2 F (36.2 C), height 5'  6.5" (1.689 m), weight 148 lb 12.8 oz (67.495 kg).  Assessment/Plan This is an 77 year old female with painful bleeding external hemorrhoids. At this point, she will to continue conservative measures. In going to place her on Analpram and lidocaine creams. I discussed hemorrhoidectomy with her in the operating room. She is going to see me back in a month and then decide whether she will stay proceed with hemorrhoidectomy  Joakim Huesman A 08/14/2010, 9:47 AM

## 2010-08-19 ENCOUNTER — Emergency Department (HOSPITAL_COMMUNITY): Payer: Medicare HMO

## 2010-08-19 ENCOUNTER — Telehealth (INDEPENDENT_AMBULATORY_CARE_PROVIDER_SITE_OTHER): Payer: Self-pay | Admitting: General Surgery

## 2010-08-19 ENCOUNTER — Inpatient Hospital Stay (HOSPITAL_COMMUNITY)
Admission: EM | Admit: 2010-08-19 | Discharge: 2010-08-27 | DRG: 389 | Disposition: A | Discharge status: Home / Self Care | Payer: Medicare HMO | Attending: General Surgery | Admitting: General Surgery

## 2010-08-19 DIAGNOSIS — B029 Zoster without complications: Secondary | ICD-10-CM | POA: Diagnosis present

## 2010-08-19 DIAGNOSIS — Z888 Allergy status to other drugs, medicaments and biological substances status: Secondary | ICD-10-CM

## 2010-08-19 DIAGNOSIS — K56609 Unspecified intestinal obstruction, unspecified as to partial versus complete obstruction: Principal | ICD-10-CM | POA: Diagnosis present

## 2010-08-19 DIAGNOSIS — J309 Allergic rhinitis, unspecified: Secondary | ICD-10-CM | POA: Diagnosis present

## 2010-08-19 DIAGNOSIS — R109 Unspecified abdominal pain: Secondary | ICD-10-CM

## 2010-08-19 DIAGNOSIS — Z8701 Personal history of pneumonia (recurrent): Secondary | ICD-10-CM

## 2010-08-19 DIAGNOSIS — K5900 Constipation, unspecified: Secondary | ICD-10-CM | POA: Diagnosis present

## 2010-08-19 DIAGNOSIS — K560 Paralytic ileus: Secondary | ICD-10-CM | POA: Diagnosis present

## 2010-08-19 DIAGNOSIS — I509 Heart failure, unspecified: Secondary | ICD-10-CM | POA: Diagnosis present

## 2010-08-19 DIAGNOSIS — M1990 Unspecified osteoarthritis, unspecified site: Secondary | ICD-10-CM | POA: Diagnosis present

## 2010-08-19 DIAGNOSIS — I4891 Unspecified atrial fibrillation: Secondary | ICD-10-CM | POA: Diagnosis not present

## 2010-08-19 DIAGNOSIS — K648 Other hemorrhoids: Secondary | ICD-10-CM | POA: Diagnosis present

## 2010-08-19 DIAGNOSIS — E538 Deficiency of other specified B group vitamins: Secondary | ICD-10-CM | POA: Diagnosis present

## 2010-08-19 DIAGNOSIS — Z880 Allergy status to penicillin: Secondary | ICD-10-CM

## 2010-08-19 DIAGNOSIS — Z9049 Acquired absence of other specified parts of digestive tract: Secondary | ICD-10-CM

## 2010-08-19 DIAGNOSIS — I10 Essential (primary) hypertension: Secondary | ICD-10-CM | POA: Diagnosis present

## 2010-08-19 DIAGNOSIS — J90 Pleural effusion, not elsewhere classified: Secondary | ICD-10-CM | POA: Diagnosis present

## 2010-08-19 DIAGNOSIS — R110 Nausea: Secondary | ICD-10-CM | POA: Diagnosis present

## 2010-08-19 DIAGNOSIS — M899 Disorder of bone, unspecified: Secondary | ICD-10-CM | POA: Diagnosis present

## 2010-08-19 LAB — COMPREHENSIVE METABOLIC PANEL
ALT: 17 U/L (ref 0–35)
Albumin: 3.7 g/dL (ref 3.5–5.2)
Alkaline Phosphatase: 57 U/L (ref 39–117)
Potassium: 4.1 mEq/L (ref 3.5–5.1)
Sodium: 136 mEq/L (ref 135–145)
Total Protein: 7.3 g/dL (ref 6.0–8.3)

## 2010-08-19 LAB — CBC
MCHC: 34 g/dL (ref 30.0–36.0)
Platelets: 148 10*3/uL — ABNORMAL LOW (ref 150–400)
RDW: 12 % (ref 11.5–15.5)

## 2010-08-19 LAB — URINALYSIS, ROUTINE W REFLEX MICROSCOPIC
Bilirubin Urine: NEGATIVE
Glucose, UA: NEGATIVE mg/dL
Ketones, ur: NEGATIVE mg/dL
Leukocytes, UA: NEGATIVE
pH: 6 (ref 5.0–8.0)

## 2010-08-19 LAB — DIFFERENTIAL
Basophils Absolute: 0 10*3/uL (ref 0.0–0.1)
Eosinophils Absolute: 0 10*3/uL (ref 0.0–0.7)
Lymphocytes Relative: 14 % (ref 12–46)
Lymphs Abs: 0.9 10*3/uL (ref 0.7–4.0)
Monocytes Relative: 6 % (ref 3–12)
Neutrophils Relative %: 80 % — ABNORMAL HIGH (ref 43–77)

## 2010-08-19 LAB — MRSA PCR SCREENING: MRSA by PCR: NEGATIVE

## 2010-08-19 LAB — URINE MICROSCOPIC-ADD ON

## 2010-08-19 MED ORDER — IOHEXOL 300 MG/ML  SOLN
100.0000 mL | Freq: Once | INTRAMUSCULAR | Status: AC | PRN
  Administered 2010-08-19: 100 mL via INTRAVENOUS

## 2010-08-19 NOTE — Telephone Encounter (Signed)
She agreed to come to [facility] ER for evaluation.

## 2010-08-20 LAB — CBC
Hemoglobin: 11.5 g/dL — ABNORMAL LOW (ref 12.0–15.0)
MCHC: 33.3 g/dL (ref 30.0–36.0)

## 2010-08-20 LAB — CREATININE, SERUM
Creatinine, Ser: 0.68 mg/dL (ref 0.50–1.10)
GFR calc non Af Amer: >60 mL/min (ref >60)

## 2010-08-21 ENCOUNTER — Observation Stay (HOSPITAL_COMMUNITY): Payer: Medicare HMO

## 2010-08-21 LAB — COMPREHENSIVE METABOLIC PANEL
ALT: 11 U/L (ref 0–35)
AST: 19 U/L (ref 0–37)
Albumin: 2.8 g/dL — ABNORMAL LOW (ref 3.5–5.2)
CO2: 21 mEq/L (ref 19–32)
Calcium: 8 mg/dL — ABNORMAL LOW (ref 8.4–10.5)
Sodium: 137 mEq/L (ref 135–145)
Total Protein: 5.8 g/dL — ABNORMAL LOW (ref 6.0–8.3)

## 2010-08-21 LAB — CBC
MCH: 32.9 pg (ref 26.0–34.0)
MCHC: 34.2 g/dL (ref 30.0–36.0)
Platelets: 103 10*3/uL — ABNORMAL LOW (ref 150–400)
RBC: 3.53 MIL/uL — ABNORMAL LOW (ref 3.87–5.11)
RDW: 12.4 % (ref 11.5–15.5)

## 2010-08-22 LAB — CBC
HCT: 33.3 % — ABNORMAL LOW (ref 36.0–46.0)
Hemoglobin: 11.4 g/dL — ABNORMAL LOW (ref 12.0–15.0)
MCH: 32.9 pg (ref 26.0–34.0)
MCV: 96 fL (ref 78.0–100.0)
RBC: 3.47 MIL/uL — ABNORMAL LOW (ref 3.87–5.11)

## 2010-08-22 LAB — POTASSIUM: Potassium: 4.3 mEq/L (ref 3.5–5.1)

## 2010-08-23 ENCOUNTER — Inpatient Hospital Stay (HOSPITAL_COMMUNITY): Payer: Medicare HMO

## 2010-08-24 LAB — BASIC METABOLIC PANEL
BUN: 4 mg/dL — ABNORMAL LOW (ref 6–23)
Chloride: 105 mEq/L (ref 96–112)
GFR calc Af Amer: >60 mL/min (ref >60)
Glucose, Bld: 101 mg/dL — ABNORMAL HIGH (ref 70–99)
Potassium: 4 mEq/L (ref 3.5–5.1)
Sodium: 139 mEq/L (ref 135–145)

## 2010-08-24 LAB — CBC
HCT: 33.8 % — ABNORMAL LOW (ref 36.0–46.0)
Hemoglobin: 11.2 g/dL — ABNORMAL LOW (ref 12.0–15.0)
RDW: 12.2 % (ref 11.5–15.5)
WBC: 4.6 10*3/uL (ref 4.0–10.5)

## 2010-08-25 ENCOUNTER — Inpatient Hospital Stay (HOSPITAL_COMMUNITY): Payer: Medicare HMO

## 2010-08-25 DIAGNOSIS — I4891 Unspecified atrial fibrillation: Secondary | ICD-10-CM

## 2010-08-26 DIAGNOSIS — R079 Chest pain, unspecified: Secondary | ICD-10-CM

## 2010-08-26 DIAGNOSIS — K649 Unspecified hemorrhoids: Secondary | ICD-10-CM

## 2010-08-26 LAB — PROTIME-INR: INR: 0.94 (ref 0.00–1.49)

## 2010-08-27 LAB — PROTIME-INR
INR: 0.96 (ref 0.00–1.49)
Prothrombin Time: 13 seconds (ref 11.6–15.2)

## 2010-08-28 ENCOUNTER — Telehealth: Payer: Self-pay | Admitting: Cardiology

## 2010-08-28 ENCOUNTER — Telehealth: Payer: Self-pay | Admitting: *Deleted

## 2010-08-28 DIAGNOSIS — I4891 Unspecified atrial fibrillation: Secondary | ICD-10-CM

## 2010-08-28 NOTE — Telephone Encounter (Signed)
Pt called to give app, 08/30/10 @11 :35 coumadin clinic, 09/01/10 @7 :30 echo, 09/04/10 @10 :30 lori gerhardt np . Pt verbalized understanding. Alfonso Ramus RN

## 2010-08-28 NOTE — Telephone Encounter (Signed)
Open in error

## 2010-08-29 ENCOUNTER — Telehealth: Payer: Self-pay | Admitting: Cardiology

## 2010-08-29 NOTE — Discharge Summary (Signed)
NAMEJADIE, [name] NO.:  [id number]  MEDICAL RECORD NO.:  [id number]  LOCATION:  1432                         FACILITY:  [facility]  PHYSICIAN:  Ardeth Sportsman, MD     DATE OF BIRTH:  08-10-1926  DATE OF ADMISSION:  08/19/2010 DATE OF DISCHARGE:  08/27/2010                              DISCHARGE SUMMARY   PRIMARY CARE PHYSICIAN:  [name] Peat, MD  CARDIOLOGIST:  Vesta Mixer, MD  OTHER SURGEON:  Abigail Miyamoto, MD  GASTROENTEROLOGIST:  Venita Lick. Russella Dar, MD, Clementeen Graham  PRINCIPAL DIAGNOSES: 1. New onset atrial fibrillation, monitored by Dr. Rollene Rotunda, MD,     [facility], for Dr. Elease Hashimoto. 2. Anal pain, probably secondary to inflamed hemorrhoids. 3. Chronic constipation with probable ileus ?partial small bowel     obstruction, resolved.  OTHER DIAGNOSES: 1. Hypertension. 2. Urinary tract infection.  DISCHARGE MEDICATIONS:  Noted on the chart include, 1. Metoprolol 25 mg p.o. b.i.d. 2. Coumadin per Coumadin clinic policy and Dr. Antoine Poche, starting 5 mg     p.o. daily. 3. Anusol HC cream to anus p.r.n. pain. 4. Psyllium p.o. daily. 5. Loratadine 10 mg daily. 6. Losartan 50 mg p.o. q.a.m. 7. Multivitamin daily. 8. Tylenol p.r.n. pain. 9. Vitamin B12 daily or monthly shots.  SUMMARY OF HOSPITAL COURSE:  [name] is an 77 year old female who recently relocated from [place] a few years ago, who saw Dr. Abigail Miyamoto for anal pain and probable hemorrhoids.  He recommended some Analpram medication.  When starting that, she began to get some crampy abdominal pain.  She came in to the emergency room with severe abdominal pain.  We were asked to admit.  She did have a scan that showed possibility of an ileus versus partial small bowel obstruction.  An NG tube was placed.  It had minimal output.  It was removed the next day and she began to have bowel movements.  We switched her perianal medications to Tucks and just hydrocortisone cream only.  We  finally convinced her to use sitz bath and she began to feel much better there.  We started her on a fiber agent with psyllium and she began to have lot more regular bowel movements and felt better.  She did struggle with nausea, but that began to be markedly improved by the end of the visit.  Near the end of the stay, she had an episode of tachycardia and was found to be in atrial fibrillation.  She was transferred down to Telemetry.  Cardiology saw her.  Dr. Antoine Poche agreed with starting the diltiazem drip.  Rate improved, but she did not fully convert.  She was switched over to oral beta-blocker.  She started anticoagulation.  By the time of discharge, she was eating well, having bowel movement, had adequate pain control.  Her anal pain was controlled.  Her abdominal crampy pain was decreased.  She was getting better overall.  Based on that, I felt it was reasonable for her to discharge home with the following instructions.  DISCHARGE INSTRUCTIONS:  She is to return to clinic to see her cardiologist in about a week.  Dr. Antoine Poche noted probably himself versus Dr. Elease Hashimoto.  Dr. Antoine Poche outlined a  probable need for echocardiogram in the outpatient.  A probable Holter monitoring as an outpatient as well.  She should get evaluated through the Coumadin clinic through Dr. Harvie Bridge office to have her anticoagulation monitored for her warfarin. Next, she should follow up with Dr. Magnus Ivan in 3-4 weeks for followup on her hemorrhoids.  Hopefully, these will improve nonoperatively.  On my exam, there were no external inflamed hemorrhoids, and she just had some perianal sensitivity.  I may ultimately require a surgery, but I would hold off on that until her cardiac issues are more stabilized.  She should stay on some type of bowel regimen, including fiber every day such as psyllium.  If this does not improve, consider follow up with Dr. Claudette Head to see if any other insights are done,  although he did do a colonoscopy on her which showed only some benign polyps and the anastomosis otherwise open.  I had a discussion with these recommendations with the patient in front of her son as well.  She has had some questionable partial small obstruction in the past, although I do not know if that has really been well documented.  I suspect she suffers from chronic constipation and needs a better bowel regimen to help control this.  At this point, I did not feel it was a good idea to recommend any surgical intervention at this time and strongly recommended she have close cardiac followup to make sure nothing was missed.  She has handouts on atrial fibrillation. She has handouts on warfarin anticoagulation in Coumadin clinic.  She has handouts on hemorrhoid management as well.  She and her son expressed appreciation and it was felt reasonable to discharge home today.     Ardeth Sportsman, MD     SCG/MEDQ  D:  08/27/2010  T:  08/27/2010  Job:  [id number]  Electronically Signed by Karie Soda MD on 08/29/2010 01:58:09 PM

## 2010-08-29 NOTE — Telephone Encounter (Signed)
Error

## 2010-08-29 NOTE — Consult Note (Signed)
[name], [name] NO.:  [id number]  MEDICAL RECORD NO.:  [id number]  LOCATION:  1432                         FACILITY:  [facility]  PHYSICIAN:  Rollene Rotunda, MD, FACCDATE OF BIRTH:  [birth year]  DATE OF CONSULTATION:  08/25/2010 DATE OF DISCHARGE:                                CONSULTATION   PRIMARY CARE PHYSICIAN:  Evelena Peat, M.D.  CARDIOLOGIST:  Vesta Mixer, M.D.  REASON FOR CONSULTATION:  The patient has atrial fibrillation.  HISTORY OF PRESENT ILLNESS:  The patient is a pleasant 77 year old white female who was admitted with small bowel obstruction.  Her past cardiac history includes a vague history of congestive heart failure, but a preserved ejection fraction on echo in 2010.  She did have some chest pain in earlier this year had a nuclear study ordered by Dr. Elease Hashimoto. This demonstrated an EF of 45% with questionable inferolateral fixed defect thought to be attenuation.  She was managed medically.  She has had no recurrent chest discomfort.  She was admitted with a small bowel obstruction, which has been treated conservatively and is resolving.  She has had some atelectasis and bilateral pleural effusions.  She has otherwise been recovering.  She is not noticing any palpitations, presyncope, or syncope.  She never had this at home.  She works.  She denies any ongoing chest pressure, neck, or arm discomfort.  She does have any shortness of breath, PND, or orthopnea.  This morning, she was noted to have atrial fibrillation with rapid rate, but again was asymptomatic.  PAST MEDICAL HISTORY: 1. B12 deficiency. 2. Hypertension. 3. Small bowel obstruction. 4. Diverticulitis. 5. Osteoarthritis. 6. Osteopenia.  PAST SURGICAL HISTORY:  Sigmoid colectomy, abdominal hysterectomy, appendectomy, tonsillectomy.  ALLERGIES/INTOLERANCES: 1. PENICILLIN. 2. CODEINE. 3. TRIMETHOPRIM/SULFAMETHOXAZOLE  MEDICATIONS:  B12, ICaps, Tylenol,  multivitamin, hydrocortisone, lidocaine, loratadine, losartan 50 mg daily (home medications).  SOCIAL HISTORY:  The patient moved down from [place] to be near her son. She is a widow.  She does not smoke cigarettes.  She still works.  FAMILY HISTORY:  Noncontributory for early coronary artery disease, though her mother and sister both died in their 53s with myocardial infarctions.  REVIEW OF SYSTEMS:  As stated in the HPI, positive for a neuropathic- type right leg discomfort.  Positive for reduced gait.  Negative for all other systems.  PHYSICAL EXAMINATION:  GENERAL:  The patient is pleasant and in no distress. VITAL SIGNS:  Blood pressure 135/75, heart rate 98 and regular, 99% saturation on room air, afebrile. HEENT:  Eyelids unremarkable.  Pupils equal, round, and reactive to light.  Fundi not visualized.  Oral mucosa unremarkable. NECK:  No jugular venous distention, 45 degrees.  Carotid upstroke brisk and symmetrical.  No bruits.  No thyromegaly.  LYMPHATICS:  No cervical, axillary, or inguinal adenopathy. LUNGS:  Decreased breath sounds with bilateral basilar crackles. BACK:  No costovertebral mass. CHEST:  Unremarkable. HEART:  PMI not displaced or sustained.  S1, S2 within normal limits. No S3.  No clicks.  No rubs.  No murmurs.  Irregular. ABDOMEN:  Mildly distended.  Positive bowel sounds.  Normal in frequency and pitch.  No bruits, no rebound, or no guarding.  No midline  pulsatile mass.  No hepatomegaly.  No splenomegaly. SKIN:  No rashes.  No nodules. EXTREMITIES:  2+ pulses.  Mild bilateral lower extremity edema. NEURO:  Oriented to person, place, and time.  Cranial nerves II through XII grossly intact.  Motor grossly intact.  LABORATORY DATA:  Chest x-ray, atelectasis, bilateral pleural effusions. Sodium 139, potassium 4.0, BUN 4, creatinine 0.63.  WBC 4.6, hemoglobin 11.2, platelets 132.  EKG, atrial fibrillation, left axis deviation, LVH, QTc  prolonged, nonspecific ST-T wave changes.  Chest x-ray, atelectasis and bilateral effusions.  ASSESSMENT/PLAN: 1. Atrial fibrillation.  The patient has atrial fibrillation, which is     new.  She is not having any symptoms related to this.  I agree with     transferring to telemetry tonight and continuing IV Cardizem, which     can be switched to p.o. meds based on her heart rate tomorrow.  I     will check an echocardiogram and a TSH.  I began to educate her and     her son about this rhythm.  She would be very reluctant to take     Coumadin, but I will have read the education material and we will     discuss this further.  Given her age and sex, she would be at risk     for thromboembolism and should be on Coumadin if there are no     contraindications from a surgical standpoint. 2. Hypertension.  Blood pressure is controlled and she will continue     current medications. 3. Pleural effusions.  This may be related to her bowel process and     atelectasis.  I will check the echocardiogram as above.     Rollene Rotunda, MD, [facility]     JH/MEDQ  D:  08/25/2010  T:  08/26/2010  Job:  [id number]  cc:   Evelena Peat, M.D.  Vesta Mixer, M.D. Fax: [phone]  Ardeth Sportsman, MD [address] [place] [address]  Electronically Signed by Rollene Rotunda MD [facility] on 08/29/2010 02:22:39 PM

## 2010-08-30 ENCOUNTER — Ambulatory Visit (INDEPENDENT_AMBULATORY_CARE_PROVIDER_SITE_OTHER): Payer: Medicare HMO | Admitting: *Deleted

## 2010-08-30 DIAGNOSIS — Z7901 Long term (current) use of anticoagulants: Secondary | ICD-10-CM | POA: Insufficient documentation

## 2010-08-30 DIAGNOSIS — I4891 Unspecified atrial fibrillation: Secondary | ICD-10-CM | POA: Insufficient documentation

## 2010-08-31 ENCOUNTER — Ambulatory Visit: Payer: Medicare HMO | Admitting: Nurse Practitioner

## 2010-08-31 ENCOUNTER — Ambulatory Visit (INDEPENDENT_AMBULATORY_CARE_PROVIDER_SITE_OTHER): Payer: Medicare HMO | Admitting: Family Medicine

## 2010-08-31 DIAGNOSIS — E539 Vitamin B deficiency, unspecified: Secondary | ICD-10-CM

## 2010-08-31 MED ORDER — CYANOCOBALAMIN 1000 MCG/ML IJ SOLN
1000.0000 ug | Freq: Once | INTRAMUSCULAR | Status: AC
  Administered 2010-08-31: 1000 ug via INTRAMUSCULAR

## 2010-09-01 ENCOUNTER — Telehealth: Payer: Self-pay | Admitting: *Deleted

## 2010-09-01 ENCOUNTER — Ambulatory Visit (HOSPITAL_COMMUNITY): Payer: Medicare HMO | Attending: Cardiovascular Disease | Admitting: Radiology

## 2010-09-01 DIAGNOSIS — I379 Nonrheumatic pulmonary valve disorder, unspecified: Secondary | ICD-10-CM | POA: Insufficient documentation

## 2010-09-01 DIAGNOSIS — I059 Rheumatic mitral valve disease, unspecified: Secondary | ICD-10-CM | POA: Insufficient documentation

## 2010-09-01 DIAGNOSIS — I079 Rheumatic tricuspid valve disease, unspecified: Secondary | ICD-10-CM | POA: Insufficient documentation

## 2010-09-01 DIAGNOSIS — I10 Essential (primary) hypertension: Secondary | ICD-10-CM | POA: Insufficient documentation

## 2010-09-01 DIAGNOSIS — I509 Heart failure, unspecified: Secondary | ICD-10-CM | POA: Insufficient documentation

## 2010-09-01 DIAGNOSIS — I4891 Unspecified atrial fibrillation: Secondary | ICD-10-CM

## 2010-09-01 NOTE — Telephone Encounter (Signed)
Pt walked into office stating she took her meds incorrect. She takes her warfarin in evening but accidentally took it this am too. Called coumadin clinic and they advised to count this am dose as her evening dose and take her next dose satureday eve 09/02/10. Advised to her and her son to get a pill minder so this dont happen again and they agreed. Pt verbalized understanding. Alfonso Ramus RN

## 2010-09-04 ENCOUNTER — Ambulatory Visit (INDEPENDENT_AMBULATORY_CARE_PROVIDER_SITE_OTHER): Payer: Medicare HMO | Admitting: Nurse Practitioner

## 2010-09-04 ENCOUNTER — Ambulatory Visit (INDEPENDENT_AMBULATORY_CARE_PROVIDER_SITE_OTHER): Payer: Medicare HMO | Admitting: *Deleted

## 2010-09-04 ENCOUNTER — Encounter: Payer: Self-pay | Admitting: Nurse Practitioner

## 2010-09-04 VITALS — BP 132/80 | HR 80 | Ht 65.0 in | Wt 146.4 lb

## 2010-09-04 DIAGNOSIS — Z7901 Long term (current) use of anticoagulants: Secondary | ICD-10-CM

## 2010-09-04 DIAGNOSIS — I4891 Unspecified atrial fibrillation: Secondary | ICD-10-CM

## 2010-09-04 DIAGNOSIS — I10 Essential (primary) hypertension: Secondary | ICD-10-CM

## 2010-09-04 DIAGNOSIS — I519 Heart disease, unspecified: Secondary | ICD-10-CM

## 2010-09-04 NOTE — Assessment & Plan Note (Addendum)
EKG today shows her to be back in sinus. I have left her on her current regimen including her coumadin. I do not think she needs a holter at this point. Will recheck an EKG on return. She will see Dr. Elease Hashimoto back in 1 month.

## 2010-09-04 NOTE — Patient Instructions (Addendum)
Increase your coumadin to 7.5 mg every day (1 1/2 tabs) except 5 mg (1 tab) one day a week on Sundays Recheck in 1 week  You are back in a regular rhythm today. Stay on your current medicines You will see Dr. Elease Hashimoto in 1 month. We may be able to stop some of your medicines at that time

## 2010-09-04 NOTE — Assessment & Plan Note (Signed)
I have left her on her coumadin. Dose is adjusted. Will recheck in 1 week.

## 2010-09-04 NOTE — Progress Notes (Signed)
[name] Date of Birth: [birth year]   History of Present Illness: [name] is seen back today for a post hospital visit. She is seen for Dr. Elease [name]. She was hospitalized earlier this month with chronic constipation with probable ileus and questionable  Partial small bowel obstruction that resolved. She was sent home on metoprolol and coumadin. She says the medicines are making her sick and that she does not tolerate any medicines. No chest pain. She has no awareness of the atrial fib.   Current Outpatient Prescriptions on File Prior to Visit  Medication Sig Dispense Refill  . cyanocobalamin (,VITAMIN B-12,) 1000 MCG/ML injection Inject 1,000 mcg into the muscle once.        . hydrocortisone (ANUSOL-HC) 2.5 % rectal cream Place rectally 4 (four) times daily.        Marland Kitchen loratadine (CLARITIN) 10 MG tablet Take 10 mg by mouth daily.        Marland Kitchen losartan (COZAAR) 50 MG tablet TAKE ONE TABLET BY MOUTH EVERY DAY  30 tablet  11  . Multiple Vitamin (MULTIVITAMIN) tablet Take 1 tablet by mouth daily.        . Multiple Vitamins-Minerals (ICAPS PO) Take by mouth.        . pramoxine-hydrocortisone (ANALPRAM HC) cream Apply topically as needed.        . triamcinolone (KENALOG) 0.1 % cream Apply 1 application topically 2 (two) times daily.          Allergies  Allergen Reactions  . Bactrim   . Codeine   . Macrobid   . Nitrofurantoin   . Penicillins   . Sulfamethoxazole W/Trimethoprim     Past Medical History  Diagnosis Date  . B12 DEFICIENCY 11/25/2009  . ESSENTIAL HYPERTENSION 09/30/2009  . CHF 01/27/2009  . SMALL BOWEL OBSTRUCTION 05/05/2008  . DIVERTICULITIS OF COLON 01/27/2009  . OSTEOARTHRITIS, KNEE, RIGHT 02/08/2009  . OSTEOPENIA 01/27/2009  . Left ventricular dysfunction   . Hypertension   . History of echocardiogram 07/25/2009    EF 45 to 50% with mild to moderate MR and TR  . Hemorrhoids   . Rectal bleeding   . Atrial fib/flutter, transient     Past Surgical History  Procedure Date   . Colon surgery 2006    Sigmoid colectomy for diverticulitis  . Abdominal hysterectomy 1996    TAHBSO  . Appendectomy 1951  . Tonsillectomy 1966    History  Smoking status  . Never Smoker   Smokeless tobacco  . Never Used    History  Alcohol Use No    Family History  Problem Relation Age of Onset  . Heart disease    . Emphysema    . Heart disease Father   . Heart disease Sister     Review of Systems: The review of systems is positive for some mild bruising. She has had chronic GI issues, never seen by GI.  All other systems were reviewed and are negative.  Physical Exam: BP 132/80  Pulse 80  Ht 5\' 5"  (1.651 m)  Wt 146 lb 6.4 oz (66.407 kg)  BMI 24.36 kg/m2 Patient is somewhat anxious but in no acute distress.She has definite flight of ideas.  Skin is warm and dry. Color is normal.  HEENT is unremarkable. Normocephalic/atraumatic. PERRL. Sclera are nonicteric. Neck is supple. No masses. No JVD. Lungs are clear. Cardiac exam shows a regular rate and rhythm. Abdomen is soft. Extremities are without edema. Gait and ROM are intact. No gross neurologic deficits noted.  LABORATORY DATA: EKG is pending   Assessment / Plan:

## 2010-09-04 NOTE — Assessment & Plan Note (Signed)
EF is 45 to 50% per recent echo. She is asymptomatic. For now, I have left her on her current regimen.

## 2010-09-06 ENCOUNTER — Telehealth: Payer: Self-pay | Admitting: Cardiovascular Disease

## 2010-09-06 NOTE — Telephone Encounter (Signed)
error 

## 2010-09-13 ENCOUNTER — Ambulatory Visit (INDEPENDENT_AMBULATORY_CARE_PROVIDER_SITE_OTHER): Payer: Medicare HMO | Admitting: *Deleted

## 2010-09-13 DIAGNOSIS — I4891 Unspecified atrial fibrillation: Secondary | ICD-10-CM

## 2010-09-13 DIAGNOSIS — Z7901 Long term (current) use of anticoagulants: Secondary | ICD-10-CM

## 2010-09-13 LAB — POCT INR: INR: 4.1

## 2010-09-14 ENCOUNTER — Encounter: Payer: Self-pay | Admitting: Cardiovascular Disease

## 2010-09-14 ENCOUNTER — Ambulatory Visit (INDEPENDENT_AMBULATORY_CARE_PROVIDER_SITE_OTHER): Payer: Medicare HMO | Admitting: Cardiovascular Disease

## 2010-09-14 VITALS — BP 136/88 | HR 64 | Ht 65.5 in | Wt 140.0 lb

## 2010-09-14 DIAGNOSIS — I10 Essential (primary) hypertension: Secondary | ICD-10-CM

## 2010-09-14 DIAGNOSIS — I4891 Unspecified atrial fibrillation: Secondary | ICD-10-CM

## 2010-09-14 NOTE — Assessment & Plan Note (Signed)
Her blood pressure is very well controlled today. It sounds like she's having some symptoms of orthostatic hypotension. Her metoprolol dose was decreased slightly during her last visit. It also appears that she was taking higher dose of metoprolol and was suggested that she lower the dose by the pharmacist in Coumadin clinic yesterday.  Agree with the reduction of the Toprol for now. We'll continue to follow her until just. I'll see her again in 3 months.

## 2010-09-14 NOTE — Assessment & Plan Note (Addendum)
Her atrial fibrillation is well controlled. She remains in normal sinus rhythm today. Continue with 25 mg of metoprolol each day.  Her Coumadin dose was adjusted down slightly.   Her next INR will be on  September 13.  I will see her again in 3 months

## 2010-09-14 NOTE — Progress Notes (Signed)
[name] Date of Birth  09/26/1926 [facility] [address]     [address] [place], [place]  [address] [phone]  Fax  [phone]  History of Present Illness:  77 year old female with a recent onset of atrial fibrillation. She was discharged home on metoprolol 25 mg 3 times a day. She was having problems with orthostatic hypotension. Her dose was reduced to 25 mg once a day. She seems to be feeling a bit better although she continues to have some episodes of dizziness.   Current Outpatient Prescriptions on File Prior to Visit  Medication Sig Dispense Refill  . cyanocobalamin (,VITAMIN B-12,) 1000 MCG/ML injection Inject 1,000 mcg into the muscle once.        . hydrocortisone (ANUSOL-HC) 2.5 % rectal cream Place rectally 4 (four) times daily.        Marland Kitchen loratadine (CLARITIN) 10 MG tablet Take 10 mg by mouth daily.        Marland Kitchen losartan (COZAAR) 50 MG tablet TAKE ONE TABLET BY MOUTH EVERY DAY  30 tablet  11  . metoprolol tartrate (LOPRESSOR) 25 MG tablet Take 25 mg by mouth.        . Multiple Vitamin (MULTIVITAMIN) tablet Take 1 tablet by mouth daily.        . Multiple Vitamins-Minerals (ICAPS PO) Take by mouth.        . pramoxine-hydrocortisone (ANALPRAM HC) cream Apply topically as needed.        . triamcinolone (KENALOG) 0.1 % cream Apply 1 application topically 2 (two) times daily.        Marland Kitchen warfarin (COUMADIN) 5 MG tablet Take 5 mg by mouth daily. As directed         Allergies  Allergen Reactions  . Bactrim   . Codeine   . Macrobid   . Nitrofurantoin   . Penicillins   . Sulfamethoxazole W/Trimethoprim     Past Medical History  Diagnosis Date  . B12 DEFICIENCY 11/25/2009  . ESSENTIAL HYPERTENSION 09/30/2009  . CHF 01/27/2009  . SMALL BOWEL OBSTRUCTION 05/05/2008  . DIVERTICULITIS OF COLON 01/27/2009  . OSTEOARTHRITIS, KNEE, RIGHT 02/08/2009  . OSTEOPENIA 01/27/2009  . Left ventricular dysfunction   . Hypertension   . History of  echocardiogram 07/25/2009    EF 45 to 50% with mild to moderate MR and TR  . Hemorrhoids   . Rectal bleeding   . Atrial fib/flutter, transient     Past Surgical History  Procedure Date  . Colon surgery 2006    Sigmoid colectomy for diverticulitis  . Abdominal hysterectomy 1996    TAHBSO  . Appendectomy 1951  . Tonsillectomy 1966    History  Smoking status  . Never Smoker   Smokeless tobacco  . Never Used    History  Alcohol Use No    Family History  Problem Relation Age of Onset  . Heart disease    . Emphysema    . Heart disease Father   . Heart disease Sister     Reviw of Systems:  Reviewed in the HPI.  All other systems are negative.  Physical Exam: BP 136/88  Pulse 64  Ht 5' 5.5" (1.664 m)  Wt 140 lb (63.504 kg)  BMI 22.94 kg/m2 The patient is alert and oriented x 3.  The mood and affect are normal.   Skin: warm and dry.  Color is normal.    HEENT:   the sclera are nonicteric.  The mucous membranes are  moist.  The carotids are 2+ without bruits.  There is no thyromegaly.  There is no JVD.    Lungs: clear.  The chest wall is non tender.    Heart: regular rate with a normal S1 and S2.  There are no murmurs, gallops, or rubs. The PMI is not displaced.     Abdomen: good bowel sounds.  There is no guarding or rebound.  There is no hepatosplenomegaly or tenderness.  There are no masses.   Extremities:  no clubbing, cyanosis, or edema.  The legs are without rashes.  The distal pulses are intact.   Neuro:  Cranial nerves II - XII are intact.  Motor and sensory functions are intact.    The gait is normal.   Assessment / Plan:

## 2010-09-19 NOTE — H&P (Signed)
[name], LIGAS NO.:  [id number]  MEDICAL RECORD NO.:  [id number]  LOCATION:  [facility]                         FACILITY:  [facility]  PHYSICIAN:  Lodema Pilot, MD       DATE OF BIRTH:  1926/09/02  DATE OF ADMISSION:  08/19/2010 DATE OF DISCHARGE:                             HISTORY & PHYSICAL   CHIEF COMPLAINT:  Abdominal pain.  HISTORY OF PRESENT ILLNESS:  Ms. [name] is an 77 year old female who is known to our practice as a patient of Dr. Magnus Ivan.  She has a history of prior colectomy for diverticulitis and she was recently seen by Dr. Magnus Ivan for evaluation of similar disease, which she states is very painful to her.  She was prescribed some suppository medication as well as some topical lidocaine and she applied this yesterday afternoon approximately 3 o'clock for the first time and immediately began having abdominal cramping and pain, nausea, and vomiting.  She states that her symptoms started with the nausea, which led to some vomiting and then she subsequently developed the abdominal pain, which she describes as crampy and located mostly in the right lower abdomen, but has become progressively worse and is located now across the entire lower abdomen. She has a history of bowel obstruction previously, which was treated nonoperatively with NG tube decompression and she states that her symptoms are similar to that, but with some minor differences.  She states that she had been passing flatus and moving her bowels, but she said are hard frequent stools.  She also admits to some chills and sweats, but denies any fevers.  She denies any blood in the stools or black stools, and denies any hematemesis.  ALLERGIES:  She has multiple medication allergies.  When asked about this, she only remembers SEPTRA, VOLTAREN, and CODEINE.  MEDICATIONS:  Please refer to home medication list.  PAST MEDICAL HISTORY: 1. CHF. 2. Rhinitis. 3. Shingles. 4. Multiple  episodes of pneumonia. 5. Hypertension. 6. Diverticulosis and diverticulitis. 7. Prior bowel obstruction.  PAST SURGICAL HISTORY: 1. Sigmoidectomy. 2. Appendectomy. 3. Bladder surgery. 4. Hysterectomy.  SOCIAL HISTORY:  She denies any smoking or alcohol use.  REVIEW OF SYSTEMS:  Noncontributory other than in the history of present illness.  PHYSICAL EXAMINATION:  GENERAL:  She is in no acute distress and nontoxic appearing.  She is alert and oriented x3 and responds appropriately to all questions. HEENT:  Head is normocephalic and atraumatic.  Her sclera is white. Conjunctiva is pink.  No discharge. NECK:  Her trachea is midline and no stridor. LUNGS:  Clear to auscultation bilaterally without wheezes, rales, or crackles.  No respiratory distress. HEART:  Rate is normal, regular rhythm. ABDOMEN:  Soft with some mild distention.  She is very tender in the right mid abdomen and in the right lower quadrant and suprapubic region. She has some voluntary guarding, but no rebound or rigidity, no evidence of incisional hernias or inguinal hernias. RECTAL:  Exam demonstrated 2-column hemorrhoid disease with some erythema and injection of the hemorrhoid columns, some active inflammation and edema.  Rectal exam did not reveal any distinct masses and the rectal exam was mildly tender to deep palpation. SKIN:  Warm and dry.  No other skin lesions. VITAL SIGNS:  Her temperature is 98.6, heart rate is 90, blood pressure is 177/100, respirations 18, and her saturations are 96% on room air.  LABORATORY STUDIES:  Pending at this time.  IMAGING STUDIES:  Currently, she has acute abdominal series, which did not demonstrate any evidence of free air, although there is some hypodensity along the left side wall, but I do not feel this is free air.  I will recommend CT scan to confirm.  She does have some visible prominent small bowel.  She does appear to have some gas in the colon and possible  early bowel obstruction, but the abdominal series is felt to be nonspecific.  ASSESSMENT:  Abdominal pain and hemorrhoids.  I am mostly concerned with her abdominal pain.  This seems to be fairly significant and I would not attribute this strictly to hemorrhoid disease.  She does have some mild distention and history of bowel obstruction and I am concerned that this might be a return of possible bowel obstruction or partial bowel obstruction.  I was also concerned initially when I spoke with this patient that she may have rectal perforation from the applicator of her suppository, although I am less suspicious of this now.  I discussed this case with Dr. Denton Lank, the emergency room physician.  He was also taking care of her and we feel that CT scan of the abdomen will be appropriate to evaluate possible bowel obstruction or evaluate for any other intra-abdominal catastrophe or to visualize for any free air.  I think that given her persistent nausea, vomiting, and inability to tolerate p.o. intake and such, inpatient admission might be justified with fluid hydration and bowel rest.  If this is bowel obstruction confirmed by CT scan, I would recommend NG tube decompression, aggressive fluid hydration, and bowel rest.          ______________________________ Lodema Pilot, MD     BL/MEDQ  D:  08/19/2010  T:  08/19/2010  Job:  [id number]  Electronically Signed by Lodema Pilot DO on 09/19/2010 03:54:59 PM

## 2010-09-20 ENCOUNTER — Encounter: Payer: Self-pay | Admitting: Family Medicine

## 2010-09-20 ENCOUNTER — Ambulatory Visit (INDEPENDENT_AMBULATORY_CARE_PROVIDER_SITE_OTHER): Payer: Medicare HMO | Admitting: Family Medicine

## 2010-09-20 VITALS — BP 140/82 | Temp 98.8°F | Wt 149.0 lb

## 2010-09-20 DIAGNOSIS — I4891 Unspecified atrial fibrillation: Secondary | ICD-10-CM

## 2010-09-20 DIAGNOSIS — E538 Deficiency of other specified B group vitamins: Secondary | ICD-10-CM

## 2010-09-20 DIAGNOSIS — I10 Essential (primary) hypertension: Secondary | ICD-10-CM

## 2010-09-20 DIAGNOSIS — K56609 Unspecified intestinal obstruction, unspecified as to partial versus complete obstruction: Secondary | ICD-10-CM

## 2010-09-20 DIAGNOSIS — K56600 Partial intestinal obstruction, unspecified as to cause: Secondary | ICD-10-CM

## 2010-09-20 MED ORDER — CYANOCOBALAMIN 1000 MCG/ML IJ SOLN
1000.0000 ug | Freq: Once | INTRAMUSCULAR | Status: AC
  Administered 2010-09-20: 1000 ug via INTRAMUSCULAR

## 2010-09-20 NOTE — Progress Notes (Signed)
  Subjective:    Patient ID: [name], female    DOB: 1926-07-09, 77 y.o.   MRN: [id number]  HPI Medical followup. Patient last month presented with abdominal distention and abdominal pain. Question of ileus versus partial small bowel structure. Resolved after several days. No recurrence of abdominal pain since then. She was diagnosed with atrial fibrillation and currently metoprolol 25 mg daily and Coumadin. Follow by cardiology clinic. Denies any dizziness at this time and no chest pains. No dyspnea. She has a chronic low-grade nausea but no vomiting. Bowel habits been normal. She has occasional mild dizziness but no consistent orthostatic symptoms. She also remains on losartan 50 mg daily.   Review of Systems  Constitutional: Negative for fever and chills.  HENT: Negative for trouble swallowing.   Respiratory: Negative for cough, shortness of breath and wheezing.   Cardiovascular: Negative for chest pain, palpitations and leg swelling.  Gastrointestinal: Negative for abdominal pain and blood in stool.  Genitourinary: Negative for hematuria.  Neurological: Positive for dizziness. Negative for headaches.  Psychiatric/Behavioral: Negative for confusion.       Objective:   Physical Exam  Constitutional: She is oriented to person, place, and time.  HENT:  Right Ear: External ear normal.  Left Ear: External ear normal.  Neck: Neck supple.  Cardiovascular:       Irregularly irregular rhythm rate controlled 80s  Pulmonary/Chest: Effort normal and breath sounds normal. No respiratory distress. She has no wheezes. She has no rales.  Abdominal: Soft. Bowel sounds are normal. She exhibits no distension and no mass. There is no tenderness. There is no rebound and no guarding.  Musculoskeletal: She exhibits no edema.  Lymphadenopathy:    She has no cervical adenopathy.  Neurological: She is alert and oriented to person, place, and time. No cranial nerve deficit.            Assessment & Plan:  #1 atrial fibrillation. Rate controlled. Patient now on Coumadin followed in the cardiology clinic. Education given regarding atrial fibrillation and need to maintain on anticoagulation. Patient understands #2 recent question of partial small bowel obstruction. No evidence recurrence at this time. Reviewed signs and symptoms

## 2010-09-21 ENCOUNTER — Ambulatory Visit (INDEPENDENT_AMBULATORY_CARE_PROVIDER_SITE_OTHER): Payer: Medicare HMO | Admitting: *Deleted

## 2010-09-21 ENCOUNTER — Other Ambulatory Visit: Payer: Self-pay | Admitting: Cardiology

## 2010-09-21 DIAGNOSIS — Z7901 Long term (current) use of anticoagulants: Secondary | ICD-10-CM

## 2010-09-21 DIAGNOSIS — I4891 Unspecified atrial fibrillation: Secondary | ICD-10-CM

## 2010-09-22 MED ORDER — WARFARIN SODIUM 5 MG PO TABS: 5.0000 mg | ORAL_TABLET | ORAL | Status: DC

## 2010-09-25 ENCOUNTER — Encounter (INDEPENDENT_AMBULATORY_CARE_PROVIDER_SITE_OTHER): Payer: Self-pay | Admitting: Surgery

## 2010-09-25 ENCOUNTER — Ambulatory Visit (INDEPENDENT_AMBULATORY_CARE_PROVIDER_SITE_OTHER): Payer: Medicare HMO | Admitting: Surgery

## 2010-09-25 VITALS — BP 138/90 | HR 84

## 2010-09-25 DIAGNOSIS — K644 Residual hemorrhoidal skin tags: Secondary | ICD-10-CM

## 2010-09-25 NOTE — Progress Notes (Signed)
Subjective:     Patient ID: [name], female   DOB: 07-13-1926, 77 y.o.   MRN: [id number]  HPI  Since I saw her last she had to be admitted to the hospital for a partial bowel obstruction versus ileus. She reports she has had no bleeding per rectum. She still has hemorrhoidal discomfort that is controlling this with ProctoFoam and sitz baths. Review of Systems     Objective:   Physical Exam    On exam, she still has inflamed and edematous external hemorrhoids. These have improved since I saw her last Assessment:     Inflamed, edematous external hemorrhoids    Plan:     She will continue her current regimen. She is a poor operative candidate for general anesthesia. She is also on Coumadin. There is no plans currently for surgical excision of these hemorrhoids.

## 2010-09-29 ENCOUNTER — Telehealth: Payer: Self-pay | Admitting: Cardiovascular Disease

## 2010-09-29 ENCOUNTER — Other Ambulatory Visit: Payer: Self-pay | Admitting: *Deleted

## 2010-09-29 MED ORDER — METOPROLOL TARTRATE 25 MG PO TABS: 25.0000 mg | ORAL_TABLET | Freq: Two times a day (BID) | ORAL | Status: DC

## 2010-09-29 NOTE — Telephone Encounter (Signed)
Pt left ankle is swelling and she needs to know what to do

## 2010-09-29 NOTE — Telephone Encounter (Signed)
Discussed her medications, She was confused with metoprolol dosing stating that in the hospital she was taking it 3 times a day and the [facility] pharmacy said she would take it three times a day. I pulled hosp dc papers from [facility] and it said metoprolol 25 mg BID. She has been taking one time a day and bp at a variety of Dr offices per pt was 138/90. Metoprolol was listed in our [place] as a historical drug as once daily. According to hospital DC papers, I told her to take the MED one tablet twice a day 12 hours apart. Pt seems a bit confused with knowing to take one in am and one in pm. Reviewed to take it 12 hours apart or as close to that as possible. Reviewed the need to take bp daily and has auto pressure machine, told to elevate feet 3-4 times a day and she stated she dont have time for that, she is too busy. Told to lower sodium, check bp daily. Denies leg redness, its not warm, just swollen at ankle. Pt verbalized understanding. To call with further questions and concerns.Alfonso Ramus RN

## 2010-10-02 NOTE — Telephone Encounter (Signed)
Agree with advice given to patient.

## 2010-10-04 ENCOUNTER — Other Ambulatory Visit (INDEPENDENT_AMBULATORY_CARE_PROVIDER_SITE_OTHER): Payer: Self-pay | Admitting: Surgery

## 2010-10-11 ENCOUNTER — Other Ambulatory Visit: Payer: Self-pay | Admitting: Cardiology

## 2010-10-11 ENCOUNTER — Encounter: Payer: Medicare HMO | Admitting: *Deleted

## 2010-10-13 ENCOUNTER — Other Ambulatory Visit: Payer: Self-pay | Admitting: *Deleted

## 2010-10-13 ENCOUNTER — Ambulatory Visit (INDEPENDENT_AMBULATORY_CARE_PROVIDER_SITE_OTHER): Payer: Medicare HMO | Admitting: *Deleted

## 2010-10-13 DIAGNOSIS — Z7901 Long term (current) use of anticoagulants: Secondary | ICD-10-CM

## 2010-10-13 DIAGNOSIS — I4891 Unspecified atrial fibrillation: Secondary | ICD-10-CM

## 2010-10-13 MED ORDER — METOPROLOL TARTRATE 25 MG PO TABS: 25.0000 mg | ORAL_TABLET | Freq: Two times a day (BID) | ORAL | Status: DC

## 2010-10-16 ENCOUNTER — Ambulatory Visit (INDEPENDENT_AMBULATORY_CARE_PROVIDER_SITE_OTHER): Payer: Medicare HMO | Admitting: Family Medicine

## 2010-10-16 ENCOUNTER — Encounter: Payer: Self-pay | Admitting: Family Medicine

## 2010-10-16 VITALS — BP 140/90 | Temp 98.3°F | Wt 156.0 lb

## 2010-10-16 DIAGNOSIS — I10 Essential (primary) hypertension: Secondary | ICD-10-CM

## 2010-10-16 DIAGNOSIS — I4891 Unspecified atrial fibrillation: Secondary | ICD-10-CM

## 2010-10-16 DIAGNOSIS — Z136 Encounter for screening for cardiovascular disorders: Secondary | ICD-10-CM

## 2010-10-16 DIAGNOSIS — R0600 Dyspnea, unspecified: Secondary | ICD-10-CM

## 2010-10-16 DIAGNOSIS — R0609 Other forms of dyspnea: Secondary | ICD-10-CM

## 2010-10-16 DIAGNOSIS — E538 Deficiency of other specified B group vitamins: Secondary | ICD-10-CM

## 2010-10-16 DIAGNOSIS — R079 Chest pain, unspecified: Secondary | ICD-10-CM

## 2010-10-16 LAB — BASIC METABOLIC PANEL
Calcium: 8.8 mg/dL (ref 8.4–10.5)
GFR: 52.75 mL/min — ABNORMAL LOW (ref >60.00)
Glucose, Bld: 100 mg/dL — ABNORMAL HIGH (ref 70–99)
Sodium: 140 mEq/L (ref 135–145)

## 2010-10-16 LAB — BRAIN NATRIURETIC PEPTIDE: Pro B Natriuretic peptide (BNP): 941 pg/mL — ABNORMAL HIGH (ref 0.0–100.0)

## 2010-10-16 MED ORDER — CYANOCOBALAMIN 1000 MCG/ML IJ SOLN
1000.0000 ug | Freq: Once | INTRAMUSCULAR | Status: AC
  Administered 2010-10-16: 1000 ug via INTRAMUSCULAR

## 2010-10-16 MED ORDER — FUROSEMIDE 20 MG PO TABS: 20.0000 mg | ORAL_TABLET | Freq: Every day | ORAL | Status: DC

## 2010-10-16 NOTE — Progress Notes (Signed)
Subjective:    Patient ID: [name], female    DOB: 04-Dec-1926, 77 y.o.   MRN: [id number]  HPI Patient presents with progressive dyspnea and lower extremity edema over estimated 2 weeks. Recent hospitalization for partial small bowel obstruction. New onset atrial fibrillation. Patient initiated on Coumadin. She had nuclear stress test back in May which revealed ejection fraction 55-60% and no evidence for ischemia. Echocardiogram during recent hospitalization ejection fraction of 45-50%. She's had 7 pound weight gain since last visit and some pitting edema bilateral legs. She also describes increased dyspnea activity and occasional orthopnea. She has occasional fleeting symptoms of substernal chest tightness which usually last 30 seconds to 1 minute. Sometimes at rest and sometimes with activity. No pleuritic pain.  Patient initiated recently Lopressor 25 mg twice a day. Protimes followed through the Coumadin clinic. Compliant with medications.  Past Medical History  Diagnosis Date  . B12 DEFICIENCY 11/25/2009  . ESSENTIAL HYPERTENSION 09/30/2009  . CHF 01/27/2009  . SMALL BOWEL OBSTRUCTION 05/05/2008  . DIVERTICULITIS OF COLON 01/27/2009  . OSTEOARTHRITIS, KNEE, RIGHT 02/08/2009  . OSTEOPENIA 01/27/2009  . Left ventricular dysfunction   . Hypertension   . History of echocardiogram 07/25/2009    EF 45 to 50% with mild to moderate MR and TR  . Hemorrhoids   . Rectal bleeding   . Atrial fib/flutter, transient    Past Surgical History  Procedure Date  . Colon surgery 2006    Sigmoid colectomy for diverticulitis  . Abdominal hysterectomy 1996    TAHBSO  . Appendectomy 1951  . Tonsillectomy 1966    reports that she has never smoked. She has never used smokeless tobacco. She reports that she does not drink alcohol or use illicit drugs. family history includes Emphysema in an unspecified family member and Heart disease in her father, sister, and unspecified family member. Allergies    Allergen Reactions  . Bactrim   . Codeine   . Macrobid   . Nitrofurantoin   . Penicillins   . Sulfamethoxazole W/Trimethoprim       Review of Systems  Constitutional: Positive for fatigue. Negative for fever, chills and unexpected weight change.  Respiratory: Positive for shortness of breath. Negative for cough.   Cardiovascular: Positive for chest pain and leg swelling. Negative for palpitations.  Gastrointestinal: Negative for abdominal pain.  Neurological: Negative for dizziness, syncope and headaches.  Hematological: Negative for adenopathy.       Objective:   Physical Exam  Constitutional: She appears well-developed and well-nourished.  HENT:  Mouth/Throat: Oropharynx is clear and moist.  Neck: Neck supple. No thyromegaly present.  Cardiovascular:       Irregularly irregular rhythm rate around 100  Pulmonary/Chest:       Somewhat diminished breath sounds both bases. No wheezes  Abdominal: Soft. There is no tenderness.  Musculoskeletal:       Trace to 1+ pitting edema lower legs bilaterally  Lymphadenopathy:    She has no cervical adenopathy.  Psychiatric: She has a normal mood and affect. Her behavior is normal.          Assessment & Plan:  #1 peripheral edema. Likely has some volume overload with 7 pound weight gain since last visit. Concern for CHF.   #2 progressive dyspnea. Patient has some associated worrisome symptoms of chest tightness intermittently. Negative nuclear stress test last May. ?volume overload and pulmonary edema as above. #3 recent onset atrial fibrillation on Coumadin.  Could not locate recent TSH.  Rate up somewhat today  at 100. #4 hypertension  Check EKG. Obtain labs with BNP, metabolic panel, and TSH. Initiate Lasix 20 mg daily. Consult with cardiologist regarding progressive dyspnea and intermittent chest discomfort.

## 2010-10-17 ENCOUNTER — Ambulatory Visit (INDEPENDENT_AMBULATORY_CARE_PROVIDER_SITE_OTHER): Payer: Medicare HMO | Admitting: Cardiovascular Disease

## 2010-10-17 ENCOUNTER — Encounter: Payer: Self-pay | Admitting: Cardiovascular Disease

## 2010-10-17 DIAGNOSIS — I509 Heart failure, unspecified: Secondary | ICD-10-CM

## 2010-10-17 DIAGNOSIS — I4891 Unspecified atrial fibrillation: Secondary | ICD-10-CM

## 2010-10-17 DIAGNOSIS — Z7901 Long term (current) use of anticoagulants: Secondary | ICD-10-CM

## 2010-10-17 MED ORDER — FUROSEMIDE 40 MG PO TABS: 40.0000 mg | ORAL_TABLET | Freq: Every day | ORAL | Status: DC

## 2010-10-17 MED ORDER — POTASSIUM CHLORIDE 10 MEQ PO TBCR: 10.0000 meq | EXTENDED_RELEASE_TABLET | Freq: Every day | ORAL | Status: DC

## 2010-10-17 MED ORDER — ATENOLOL 50 MG PO TABS: 50.0000 mg | ORAL_TABLET | Freq: Two times a day (BID) | ORAL | Status: DC

## 2010-10-17 NOTE — Progress Notes (Signed)
[name] Date of Birth  August 20, 1926  [facility] [address]    [address] [place], [place]  [address] [phone]  Fax  [phone]  History of Present Illness:  77 year old female with a history of hypertension. She was recently hospitalized with small bowel obstruction. She had some atrial fibrillation during that visit.  She also had an echocardiogram which revealed an ejection fraction between 45 and 50%. She has mild to moderate mitral regurgitation and mild to moderate tricuspid regurgitation.  A stress Myoview study revealed no evidence of ischemia. She did have some apical thinning but the apex seemed to contract fairly normally. She did have some mild hypokinesis of the inferior wall.  She was seen yesterday by Dr. Caryl Never and was found to have symptoms consistent with congestive heart failure as well as atrial fibrillation with a rapid ventricular response. He start her back on Lasix at low dose.  She's been urinating more than usual but she still feels short of breath. She thinks her leg edema may be a little bit better.   Current Outpatient Prescriptions on File Prior to Visit  Medication Sig Dispense Refill  . cyanocobalamin (,VITAMIN B-12,) 1000 MCG/ML injection Inject 1,000 mcg into the muscle once.        . furosemide (LASIX) 20 MG tablet Take 1 tablet (20 mg total) by mouth daily.  30 tablet  5  . loratadine (CLARITIN) 10 MG tablet Take 10 mg by mouth daily.        Marland Kitchen losartan (COZAAR) 50 MG tablet TAKE ONE TABLET BY MOUTH EVERY DAY  30 tablet  11  . metoprolol tartrate (LOPRESSOR) 25 MG tablet Take 1 tablet (25 mg total) by mouth 2 (two) times daily.  60 tablet  3  . Multiple Vitamin (MULTIVITAMIN) tablet Take 1 tablet by mouth daily.        . Multiple Vitamins-Minerals (ICAPS PO) Take by mouth.        Marland Kitchen PROCTOSOL HC 2.5 % rectal cream APPLY 4 TIMES A DAY FOR ANAL OR HEMORRHOID PAIN  29 g  2  . triamcinolone (KENALOG) 0.1 % cream Apply 1 application  topically 2 (two) times daily.        Marland Kitchen warfarin (COUMADIN) 5 MG tablet Take 1 tablet (5 mg total) by mouth as directed.  45 tablet  2   Current Facility-Administered Medications on File Prior to Visit  Medication Dose Route Frequency Provider Last Rate Last Dose  . cyanocobalamin ((VITAMIN B-12)) injection 1,000 mcg  1,000 mcg Intramuscular Once Bruce W Burchette   1,000 mcg at 10/16/10 1342    Allergies  Allergen Reactions  . Bactrim   . Codeine   . Macrobid   . Nitrofurantoin   . Penicillins   . Sulfamethoxazole W/Trimethoprim     Past Medical History  Diagnosis Date  . B12 DEFICIENCY 11/25/2009  . ESSENTIAL HYPERTENSION 09/30/2009  . CHF 01/27/2009  . SMALL BOWEL OBSTRUCTION 05/05/2008  . DIVERTICULITIS OF COLON 01/27/2009  . OSTEOARTHRITIS, KNEE, RIGHT 02/08/2009  . OSTEOPENIA 01/27/2009  . Left ventricular dysfunction   . Hypertension   . History of echocardiogram 07/25/2009    EF 45 to 50% with mild to moderate MR and TR  . Hemorrhoids   . Rectal bleeding   . Atrial fib/flutter, transient     Past Surgical History  Procedure Date  . Colon surgery 2006    Sigmoid colectomy for diverticulitis  . Abdominal hysterectomy 1996    TAHBSO  . Appendectomy 1951  .  Tonsillectomy 1966    History  Smoking status  . Never Smoker   Smokeless tobacco  . Never Used    History  Alcohol Use No    Family History  Problem Relation Age of Onset  . Heart disease    . Emphysema    . Heart disease Father   . Heart disease Sister     Reviw of Systems:  Reviewed in the HPI.  All other systems are negative.  Physical Exam: BP 136/90  Pulse 85  Ht 5' 6.5" (1.689 m)  Wt 152 lb 1.9 oz (69.001 kg)  BMI 24.18 kg/m2 The patient is alert and oriented x 3.  The mood and affect are normal.   Skin: warm and dry.  Color is normal.    HEENT:   the sclera are nonicteric.  The mucous membranes are moist.  The carotids are 2+ without bruits.  There is no thyromegaly.  There is no  JVD.    Lungs: clear.  The chest wall is non tender.    Heart: Irregular rate with a normal S1 and S2.  She is tachycardic  There is a soft murmur.   The PMI is not displaced.     Abdomen: good bowel sounds.  There is no guarding or rebound.  There is no hepatosplenomegaly or tenderness.  There are no masses.   Extremities:  Trace edema, left > right.  The legs are without rashes.  The distal pulses are intact.   Neuro:  Cranial nerves II - XII are intact.  Motor and sensory functions are intact.    The gait is normal.  ECG: Reviewed the EKG from Dr. Lovena Le office yesterday. She has atrial fibrillation with a rapid ventricular response. Assessment / Plan:

## 2010-10-17 NOTE — Progress Notes (Signed)
Quick Note:  Labs will be reviewed with pt at cardiology appt today ______

## 2010-10-17 NOTE — Assessment & Plan Note (Addendum)
Left ventricle systolic function is mildly depressed with an EF of around 45%. I suspect her recent decompensation is because of her atrial fibrillation with a rapid ventricular response. We'll increase her Lasix to 40 mg a day and I potassium chloride 10 mEq a day. We'll attempt a cardioversion on her in several weeks.

## 2010-10-17 NOTE — Assessment & Plan Note (Signed)
Her INR levels have been therapeutic for a little bit higher. We will anticipate doing a cardioversion on her in several weeks after we achieve better rate control and tune up  her congestive heart failure.

## 2010-10-17 NOTE — Assessment & Plan Note (Signed)
She remains in atrial fibrillation with a rapid ventricular response. I suspect that this is what is causing her to have her congestive heart failure. We'll continue to try to achieve better rate control by increasing her metoprolol to 50 mg twice a day. We'll also increase her Lasix to 40 mg a day and add potassium chloride 10 mg a day.  Her INR levels have been therapeutic. I'll see her again in 2 weeks. At that time we will see if she has improved and we'll schedule her for an outpatient cardioversion.

## 2010-10-17 NOTE — Patient Instructions (Addendum)
Your physician recommends that you schedule a follow-up appointment in: 2 weeks  Your physician recommends that you return for lab work in: 2 weeks/ bmp/ inr/ cbc  Your physician has recommended you make the following change in your medication:   1) increase metoprolol to 50 mg one tablet twice a day.  2) increase lasix to 40 mg one tablet each morning  3) add potassium 10 meg one tablet daily.  All sent to pharmacy

## 2010-10-18 ENCOUNTER — Telehealth: Payer: Self-pay | Admitting: *Deleted

## 2010-10-18 ENCOUNTER — Telehealth: Payer: Self-pay | Admitting: Cardiovascular Disease

## 2010-10-18 MED ORDER — METOPROLOL TARTRATE 50 MG PO TABS: 50.0000 mg | ORAL_TABLET | Freq: Two times a day (BID) | ORAL | Status: DC

## 2010-10-18 NOTE — Telephone Encounter (Signed)
Pt calling re medication , was given atenolol and metoprolol and confused as to if she is to take both, or one or the other?  Said both mg were increased

## 2010-10-18 NOTE — Telephone Encounter (Signed)
Called msg to son to call me back/ pt gets very confused and i reviewed with son and Im sure he is good with her med's and will take care of situation, i will wait to see if he returns my call.

## 2010-10-18 NOTE — Telephone Encounter (Signed)
Atenolol dc was not noted/ change made on note today.

## 2010-10-25 ENCOUNTER — Encounter: Payer: Self-pay | Admitting: Family Medicine

## 2010-10-25 ENCOUNTER — Ambulatory Visit (INDEPENDENT_AMBULATORY_CARE_PROVIDER_SITE_OTHER): Payer: Medicare HMO | Admitting: Family Medicine

## 2010-10-25 ENCOUNTER — Encounter: Payer: Medicare HMO | Admitting: *Deleted

## 2010-10-25 DIAGNOSIS — I4891 Unspecified atrial fibrillation: Secondary | ICD-10-CM

## 2010-10-25 DIAGNOSIS — I10 Essential (primary) hypertension: Secondary | ICD-10-CM

## 2010-10-25 DIAGNOSIS — I519 Heart disease, unspecified: Secondary | ICD-10-CM

## 2010-10-25 LAB — BASIC METABOLIC PANEL
BUN: 23 mg/dL (ref 6–23)
Calcium: 8.6 mg/dL (ref 8.4–10.5)
Creatinine, Ser: 1.1 mg/dL (ref 0.4–1.2)
GFR: 52.74 mL/min — ABNORMAL LOW (ref >60.00)

## 2010-10-25 NOTE — Patient Instructions (Signed)
Increase furosemide to 40 mg in the morning and one half tablet (20 mg) every afternoon.

## 2010-10-25 NOTE — Progress Notes (Signed)
  Subjective:    Patient ID: [name], female    DOB: [birth year], 77 y.o.   MRN: [id number]  HPI Medical followup. Recent diagnosis atrial fibrillation. Was seen last visit with congestive heart failure and mild tachycardia. Metoprolol increased to 50 mg twice daily and Lasix increased to 40 mg with addition of potassium. Patient still has some dyspnea mostly with activity. No significant orthopnea. Some nonproductive cough. No fever. Coumadin being followed through Coumadin clinic. Patient denies any chest pain at this time. She is compliant with all medications. Weight is 150 today down from 156 previously. Her normal weight is around 145 pounds  No recent falls and low risk for fall.  No bleeding complications from coumadin.  BP stable. No orthostasis.  Past Medical History  Diagnosis Date  . B12 DEFICIENCY 11/25/2009  . ESSENTIAL HYPERTENSION 09/30/2009  . CHF 01/27/2009  . SMALL BOWEL OBSTRUCTION 05/05/2008  . DIVERTICULITIS OF COLON 01/27/2009  . OSTEOARTHRITIS, KNEE, RIGHT 02/08/2009  . OSTEOPENIA 01/27/2009  . Left ventricular dysfunction   . Hypertension   . History of echocardiogram 07/25/2009    EF 45 to 50% with mild to moderate MR and TR  . Hemorrhoids   . Rectal bleeding   . Atrial fib/flutter, transient    Past Surgical History  Procedure Date  . Colon surgery 2006    Sigmoid colectomy for diverticulitis  . Abdominal hysterectomy 1996    TAHBSO  . Appendectomy 1951  . Tonsillectomy 1966    reports that she has never smoked. She has never used smokeless tobacco. She reports that she does not drink alcohol or use illicit drugs. family history includes Emphysema in an unspecified family member and Heart disease in her father, sister, and unspecified family member. Allergies  Allergen Reactions  . Bactrim   . Codeine   . Macrobid   . Nitrofurantoin   . Penicillins   . Sulfamethoxazole W/Trimethoprim    .   Review of Systems  Constitutional: Positive for  fatigue. Negative for fever, chills and appetite change.  Respiratory: Positive for cough and shortness of breath. Negative for wheezing.   Cardiovascular: Positive for leg swelling. Negative for chest pain and palpitations.  Gastrointestinal: Negative for abdominal pain and blood in stool.  Genitourinary: Negative for hematuria.  Neurological: Negative for dizziness and syncope.       Objective:   Physical Exam  Constitutional: She is oriented to person, place, and time. She appears well-developed and well-nourished.  HENT:  Mouth/Throat: Oropharynx is clear and moist.  Neck: Neck supple. No thyromegaly present.  Cardiovascular:       Irregularly irregular rhythm rate 72-75  Pulmonary/Chest: Effort normal and breath sounds normal. No respiratory distress. She has no wheezes. She has no rales.  Musculoskeletal:       Patient still has 1+ pitting edema lower legs bilaterally  Lymphadenopathy:    She has no cervical adenopathy.  Neurological: She is alert and oriented to person, place, and time.          Assessment & Plan:  #1 atrial fibrillation. Rate improved. Continue metoprolol. Continue cardiology followup. Continue Coumadin through Coumadin clinic #2 congestive heart failure. Weight down 6 pounds but patient still has evidence for some volume overload. Titrate Lasix 40 mg morning and 20 mg early afternoon. Check basic metabolic panel. #3 hypertension stable.

## 2010-10-26 NOTE — Progress Notes (Signed)
Quick Note:  Pt informed on home VM ______ 

## 2010-11-03 ENCOUNTER — Ambulatory Visit (INDEPENDENT_AMBULATORY_CARE_PROVIDER_SITE_OTHER): Payer: Medicare HMO | Admitting: *Deleted

## 2010-11-03 ENCOUNTER — Encounter: Payer: Self-pay | Admitting: *Deleted

## 2010-11-03 ENCOUNTER — Encounter: Payer: Medicare HMO | Admitting: *Deleted

## 2010-11-03 ENCOUNTER — Ambulatory Visit (INDEPENDENT_AMBULATORY_CARE_PROVIDER_SITE_OTHER): Payer: Medicare HMO | Admitting: Cardiovascular Disease

## 2010-11-03 ENCOUNTER — Other Ambulatory Visit: Payer: Medicare HMO | Admitting: *Deleted

## 2010-11-03 ENCOUNTER — Encounter: Payer: Self-pay | Admitting: Cardiovascular Disease

## 2010-11-03 DIAGNOSIS — I4891 Unspecified atrial fibrillation: Secondary | ICD-10-CM

## 2010-11-03 DIAGNOSIS — I509 Heart failure, unspecified: Secondary | ICD-10-CM

## 2010-11-03 DIAGNOSIS — Z7901 Long term (current) use of anticoagulants: Secondary | ICD-10-CM

## 2010-11-03 LAB — CBC WITH DIFFERENTIAL/PLATELET
Basophils Absolute: 0 10*3/uL (ref 0.0–0.1)
Lymphocytes Relative: 38.1 % (ref 12.0–46.0)
Monocytes Relative: 11.1 % (ref 3.0–12.0)
Platelets: 150 10*3/uL (ref 150.0–400.0)
RDW: 14.5 % (ref 11.5–14.6)

## 2010-11-03 LAB — BASIC METABOLIC PANEL
CO2: 31 mEq/L (ref 19–32)
Calcium: 9 mg/dL (ref 8.4–10.5)
Chloride: 103 mEq/L (ref 96–112)
Sodium: 142 mEq/L (ref 135–145)

## 2010-11-03 LAB — POCT INR: INR: 2.1

## 2010-11-03 NOTE — Progress Notes (Signed)
[name] Date of Birth  August 30, 1926 [facility] [address]    [address] [place], [place]  [address] [phone]  Fax  [phone]  History of Present Illness:  Flow was seen several weeks ago for congestive heart failure and atrial ablation with a rapid ventricular response. We increased her metoprolol. He started on Lasix and potassium and she's feeling much better. She still has some shortness of breath with exertion but overall her condition has improved.  The leg edema has resolved.  Current Outpatient Prescriptions on File Prior to Visit  Medication Sig Dispense Refill  . cyanocobalamin (,VITAMIN B-12,) 1000 MCG/ML injection Inject 1,000 mcg into the muscle once.        . loratadine (CLARITIN) 10 MG tablet Take 10 mg by mouth daily.        Marland Kitchen losartan (COZAAR) 50 MG tablet TAKE ONE TABLET BY MOUTH EVERY DAY  30 tablet  11  . metoprolol (LOPRESSOR) 50 MG tablet Take 1 tablet (50 mg total) by mouth 2 (two) times daily.  60 tablet  5  . Multiple Vitamin (MULTIVITAMIN) tablet Take 1 tablet by mouth daily.        . Multiple Vitamins-Minerals (ICAPS PO) Take by mouth.        . potassium chloride (KLOR-CON) 10 MEQ CR tablet Take 1 tablet (10 mEq total) by mouth daily.  30 tablet  5  . PROCTOSOL HC 2.5 % rectal cream APPLY 4 TIMES A DAY FOR ANAL OR HEMORRHOID PAIN  29 g  2  . warfarin (COUMADIN) 5 MG tablet Take 1 tablet (5 mg total) by mouth as directed.  45 tablet  2    Allergies  Allergen Reactions  . Bactrim   . Codeine   . Macrobid   . Nitrofurantoin   . Penicillins   . Sulfamethoxazole W/Trimethoprim     Past Medical History  Diagnosis Date  . B12 DEFICIENCY 11/25/2009  . ESSENTIAL HYPERTENSION 09/30/2009  . CHF 01/27/2009  . SMALL BOWEL OBSTRUCTION 05/05/2008  . DIVERTICULITIS OF COLON 01/27/2009  . OSTEOARTHRITIS, KNEE, RIGHT 02/08/2009  . OSTEOPENIA 01/27/2009  . Left ventricular dysfunction   . Hypertension   . History of echocardiogram 07/25/2009    EF  45 to 50% with mild to moderate MR and TR  . Hemorrhoids   . Rectal bleeding   . Atrial fib/flutter, transient     Past Surgical History  Procedure Date  . Colon surgery 2006    Sigmoid colectomy for diverticulitis  . Abdominal hysterectomy 1996    TAHBSO  . Appendectomy 1951  . Tonsillectomy 1966    History  Smoking status  . Never Smoker   Smokeless tobacco  . Never Used    History  Alcohol Use No    Family History  Problem Relation Age of Onset  . Heart disease    . Emphysema    . Heart disease Father   . Heart disease Sister     Reviw of Systems:  Reviewed in the HPI.  All other systems are negative.  Physical Exam: BP 118/80  Pulse 80  Ht 5\' 6"  (1.676 m)  Wt 148 lb (67.132 kg)  BMI 23.89 kg/m2 The patient is alert and oriented x 3.  The mood and affect are normal.   Skin: warm and dry.  Color is normal.    HEENT:   No JVD, normal carotids  Lungs: clear   Heart: Irreg. Irreg. Soft systolic murmur    Abdomen: non tender  Extremities:  No edema  Neuro:  Non focal   ECG:  Assessment / Plan:

## 2010-11-03 NOTE — Assessment & Plan Note (Signed)
She remains in atrial fibrillation. She seems to be quite comfortable. Her rate is well controlled. We'll schedule her for cardioversion next week. Her INR levels have been therapeutic since August.

## 2010-11-03 NOTE — Patient Instructions (Signed)
Your physician recommends that you schedule a follow-up appointment in: 4 WEEKS  Your physician has recommended that you have a Cardioversion (DCCV). Electrical Cardioversion uses a jolt of electricity to your heart either through paddles or wired patches attached to your chest. This is a controlled, usually prescheduled, procedure. Defibrillation is done under light anesthesia in the hospital, and you usually go home the day of the procedure. This is done to get your heart back into a normal rhythm. You are not awake for the procedure. Please see the instruction sheet given to you today.

## 2010-11-03 NOTE — Assessment & Plan Note (Addendum)
Her congestive heart failure seems to be improving. She's doing better on the Lasix and potassium.  She is still in  atrial fibrillation. We'll schedule her for a cardioversion next week. We'll continue with her same medications.

## 2010-11-06 ENCOUNTER — Telehealth: Payer: Self-pay | Admitting: Cardiovascular Disease

## 2010-11-06 NOTE — Telephone Encounter (Signed)
New message:  She has some questions regarding her appt for cardioversion.

## 2010-11-06 NOTE — Telephone Encounter (Signed)
Reviewed cardioversion letter with her, she is to take metoprolol and coumadin that morning with sip of water, told her if she eats she cant have procedure. Pt verbalized understanding. Alfonso Ramus RN

## 2010-11-10 ENCOUNTER — Other Ambulatory Visit: Payer: Self-pay

## 2010-11-10 ENCOUNTER — Ambulatory Visit (HOSPITAL_COMMUNITY)
Admission: RE | Admit: 2010-11-10 | Discharge: 2010-11-10 | Disposition: A | Discharge status: Home / Self Care | Payer: Medicare HMO | Source: Ambulatory Visit | Attending: Cardiovascular Disease | Admitting: Cardiovascular Disease

## 2010-11-10 DIAGNOSIS — I4891 Unspecified atrial fibrillation: Secondary | ICD-10-CM

## 2010-11-10 DIAGNOSIS — Z7901 Long term (current) use of anticoagulants: Secondary | ICD-10-CM | POA: Insufficient documentation

## 2010-11-10 LAB — PROTIME-INR
INR: 2.72 — ABNORMAL HIGH (ref 0.00–1.49)
Prothrombin Time: 29.3 seconds — ABNORMAL HIGH (ref 11.6–15.2)

## 2010-11-14 ENCOUNTER — Telehealth: Payer: Self-pay | Admitting: Cardiovascular Disease

## 2010-11-14 NOTE — Telephone Encounter (Signed)
Pt is complaining of itching from the pads used in the cardioversion.  I recommended she try the hydrocortisone cream 3-4 times per day today and some benadryl if she hasn't had a problem with taking benadryl.  To call back if this doesn't help.

## 2010-11-14 NOTE — Telephone Encounter (Signed)
New message:  Pt had cardioversion and she has rash and itching around where the pads were placed.  She is using Cortisone OTC but does not seem to be helping much.  Is there anything else she should be using.

## 2010-11-17 ENCOUNTER — Ambulatory Visit (INDEPENDENT_AMBULATORY_CARE_PROVIDER_SITE_OTHER): Payer: Medicare HMO | Admitting: *Deleted

## 2010-11-17 DIAGNOSIS — Z7901 Long term (current) use of anticoagulants: Secondary | ICD-10-CM

## 2010-11-17 DIAGNOSIS — I4891 Unspecified atrial fibrillation: Secondary | ICD-10-CM

## 2010-11-20 ENCOUNTER — Telehealth: Payer: Self-pay | Admitting: Cardiovascular Disease

## 2010-11-20 ENCOUNTER — Ambulatory Visit (INDEPENDENT_AMBULATORY_CARE_PROVIDER_SITE_OTHER): Payer: Medicare HMO | Admitting: *Deleted

## 2010-11-20 DIAGNOSIS — E538 Deficiency of other specified B group vitamins: Secondary | ICD-10-CM

## 2010-11-20 MED ORDER — CYANOCOBALAMIN 1000 MCG/ML IJ SOLN: 1000.0000 ug | Freq: Once | INTRAMUSCULAR | Status: DC

## 2010-11-20 NOTE — Telephone Encounter (Signed)
Pt informed to continue lasix and k+ as per last ov note. Pt verbalized understanding. Alfonso Ramus RN

## 2010-11-20 NOTE — Telephone Encounter (Signed)
Pt called and said she was supposed to stop furosemide on 1026 and she forgot to stop it. Please call she doesn't know what to do

## 2010-11-22 NOTE — Op Note (Signed)
  NAMEHAZELY, SEALEY NO.:  [id number]  MEDICAL RECORD NO.:  [id number]  LOCATION:  [facility]                         FACILITY:  [facility]  PHYSICIAN:  Vesta Mixer, M.D. DATE OF BIRTH:  10/29/1926  DATE OF PROCEDURE:  11/10/2010 DATE OF DISCHARGE:  11/10/2010                              OPERATIVE REPORT   CARDIOVERSION NOTE:  [name] is an 77 year old female with a history of atrial fibrillation.  She has been on chronic anticoagulation therapy.  We scheduled her for elective cardioversion.  The patient came in to the hospital in a fasting state.  Consent was signed.  The patient received IV propofol 100 mg.  Synchronized cardioversion at 100 joules was performed resulting in normal sinus rhythm.  She has occasional premature atrial contractions.  COMPLICATIONS:  None.  CONCLUSIONS: 1. Successful synchronized cardioversion of atrial fibrillation. 2. We will increase her metoprolol to 75 mg twice a day for some     resting tachycardia.  I will see her in the office in 7 weeks.     Vesta Mixer, M.D.     PJN/MEDQ  D:  11/10/2010  T:  11/11/2010  Job:  [id number]  Electronically Signed by Kristeen Miss M.D. on 11/22/2010 09:47:36 AM

## 2010-12-01 ENCOUNTER — Encounter: Payer: Self-pay | Admitting: Family Medicine

## 2010-12-01 ENCOUNTER — Ambulatory Visit (INDEPENDENT_AMBULATORY_CARE_PROVIDER_SITE_OTHER): Payer: Medicare HMO | Admitting: Family Medicine

## 2010-12-01 VITALS — BP 114/70 | Temp 98.3°F | Wt 149.0 lb

## 2010-12-01 DIAGNOSIS — I4891 Unspecified atrial fibrillation: Secondary | ICD-10-CM

## 2010-12-01 DIAGNOSIS — Z23 Encounter for immunization: Secondary | ICD-10-CM

## 2010-12-01 NOTE — Progress Notes (Signed)
  Subjective:    Patient ID: [name], female    DOB: 23-Oct-1926, 77 y.o.   MRN: [id number]  HPI  Patient is seen for followup. Recent atrial fibrillation. She underwent cardioversion recently. Remains on metoprolol and Coumadin. Overall she feels well. She is back to exercising and walking without dyspnea. Edema has resolved. She denies any dizziness or chest pains. No dyspnea. Still needs flu vaccine.   Review of Systems  Constitutional: Negative for fever, chills and unexpected weight change.  Respiratory: Negative for cough and shortness of breath.   Cardiovascular: Negative for chest pain, palpitations and leg swelling.  Gastrointestinal: Negative for abdominal pain.  Neurological: Negative for dizziness and syncope.       Objective:   Physical Exam  Constitutional: She is oriented to person, place, and time. She appears well-developed and well-nourished.  Cardiovascular:       Patient has a regularly irregular rhythm but rate controlled  Pulmonary/Chest: Effort normal and breath sounds normal. No respiratory distress. She has no wheezes. She has no rales.  Musculoskeletal: She exhibits no edema.  Neurological: She is alert and oriented to person, place, and time.  Psychiatric: She has a normal mood and affect. Her behavior is normal.          Assessment & Plan:  #1 Atrial fibrillation. Recent cardioversion but appears to be back in atrial fibrillation though rate controlled. Stressed importance of maintaining Coumadin and metoprolol indefinitely. #2 peripheral edema. Stable. She does not appear to be volume overloaded at this time. Continue furosemide 40 mg once daily. Recheck basic metabolic panel at followup in 3 months

## 2010-12-04 ENCOUNTER — Other Ambulatory Visit (INDEPENDENT_AMBULATORY_CARE_PROVIDER_SITE_OTHER): Payer: Self-pay | Admitting: Surgery

## 2010-12-07 ENCOUNTER — Other Ambulatory Visit: Payer: Self-pay | Admitting: *Deleted

## 2010-12-07 MED ORDER — METOPROLOL TARTRATE 25 MG PO TABS: ORAL_TABLET | ORAL | Status: DC

## 2010-12-07 MED ORDER — METOPROLOL TARTRATE 50 MG PO TABS: ORAL_TABLET | ORAL | Status: DC

## 2010-12-08 ENCOUNTER — Other Ambulatory Visit: Payer: Self-pay | Admitting: *Deleted

## 2010-12-08 ENCOUNTER — Other Ambulatory Visit: Payer: Self-pay

## 2010-12-08 ENCOUNTER — Encounter: Payer: Self-pay | Admitting: Cardiovascular Disease

## 2010-12-08 ENCOUNTER — Ambulatory Visit (INDEPENDENT_AMBULATORY_CARE_PROVIDER_SITE_OTHER): Payer: Medicare HMO | Admitting: Cardiovascular Disease

## 2010-12-08 ENCOUNTER — Ambulatory Visit (INDEPENDENT_AMBULATORY_CARE_PROVIDER_SITE_OTHER): Payer: Medicare HMO | Admitting: *Deleted

## 2010-12-08 VITALS — BP 126/72 | HR 70 | Resp 18 | Ht 66.0 in | Wt 148.0 lb

## 2010-12-08 DIAGNOSIS — I4891 Unspecified atrial fibrillation: Secondary | ICD-10-CM

## 2010-12-08 DIAGNOSIS — Z7901 Long term (current) use of anticoagulants: Secondary | ICD-10-CM

## 2010-12-08 DIAGNOSIS — I509 Heart failure, unspecified: Secondary | ICD-10-CM

## 2010-12-08 NOTE — Assessment & Plan Note (Signed)
Her ejection fraction is between 45 and 50%. We'll continue with her same medications.  She's olmesartan and metoprolol.

## 2010-12-08 NOTE — Patient Instructions (Addendum)
Pt  to call [facility] for your dry skin. [phone] [address]  Your physician wants you to follow-up in: 6 months,You will receive a reminder letter in the mail two months in advance. If you don't receive a letter, please call our office to schedule the follow-up appointment.  Continue with coumadin clinic

## 2010-12-08 NOTE — Progress Notes (Signed)
[name] Date of Birth  [birth year] [facility] [address]    [address] [place], [place]  [address] [phone]  Fax  [phone]  History of Present Illness:  [name] is a 77 y.o. female with a history of atrial fibrillation. She was cardioverted in October, 2012. She unfortunately has gone back into atrial fibrillation.  She has felt very well. She's not having any episodes of chest pain or shortness breath. She's tolerating the rhythm quite well. Her only complaint is that she's itching from the patches were placed during the cardioversion. She also complains of having very dry and cracking skin on her hands.    Current Outpatient Prescriptions on File Prior to Visit  Medication Sig Dispense Refill  . cyanocobalamin (,VITAMIN B-12,) 1000 MCG/ML injection Inject 1,000 mcg into the muscle once.        . furosemide (LASIX) 40 MG tablet Take 40 mg by mouth daily. Taking 11/2 daily       . loratadine (CLARITIN) 10 MG tablet Take 10 mg by mouth daily.        Marland Kitchen losartan (COZAAR) 50 MG tablet TAKE ONE TABLET BY MOUTH EVERY DAY  30 tablet  11  . metoprolol (LOPRESSOR) 50 MG tablet 1 and 1/2 tab twice daily  90 tablet  6  . Multiple Vitamin (MULTIVITAMIN) tablet Take 1 tablet by mouth daily.        . Multiple Vitamins-Minerals (ICAPS PO) Take by mouth.        . potassium chloride (KLOR-CON) 10 MEQ CR tablet Take 1 tablet (10 mEq total) by mouth daily.  30 tablet  5  . PROCTOSOL HC 2.5 % rectal cream APPLY 4 TIMES A DAY FOR ANAL OR HEMORRHOID PAIN  29 g  2  . warfarin (COUMADIN) 5 MG tablet Take 1 tablet (5 mg total) by mouth as directed.  45 tablet  2  . DISCONTD: metoprolol (LOPRESSOR) 50 MG tablet Take 1 tablet (50 mg total) by mouth 2 (two) times daily.  60 tablet  5   Current Facility-Administered Medications on File Prior to Visit  Medication Dose Route Frequency Provider Last Rate Last Dose  . cyanocobalamin ((VITAMIN B-12)) injection 1,000 mcg  1,000 mcg  Intramuscular Once Bruce W Burchette        Allergies  Allergen Reactions  . Bactrim   . Codeine   . Macrobid   . Nitrofurantoin   . Penicillins   . Sulfamethoxazole W/Trimethoprim     Past Medical History  Diagnosis Date  . B12 DEFICIENCY 11/25/2009  . ESSENTIAL HYPERTENSION 09/30/2009  . CHF 01/27/2009  . SMALL BOWEL OBSTRUCTION 05/05/2008  . DIVERTICULITIS OF COLON 01/27/2009  . OSTEOARTHRITIS, KNEE, RIGHT 02/08/2009  . OSTEOPENIA 01/27/2009  . Left ventricular dysfunction   . Hypertension   . History of echocardiogram 07/25/2009    EF 45 to 50% with mild to moderate MR and TR  . Hemorrhoids   . Rectal bleeding   . Atrial fib/flutter, transient     Past Surgical History  Procedure Date  . Colon surgery 2006    Sigmoid colectomy for diverticulitis  . Abdominal hysterectomy 1996    TAHBSO  . Appendectomy 1951  . Tonsillectomy 1966    History  Smoking status  . Never Smoker   Smokeless tobacco  . Never Used    History  Alcohol Use No    Family History  Problem Relation Age of Onset  . Heart disease    . Emphysema    .  Heart disease Father   . Heart disease Sister     Reviw of Systems:  Reviewed in the HPI.  All other systems are negative.  Physical Exam: BP 126/72  Pulse 70  Resp 18  Ht 5\' 6"  (1.676 m)  Wt 148 lb (67.132 kg)  BMI 23.89 kg/m2 The patient is alert and oriented x 3.  The mood and affect are normal.   Skin: warm and dry.  Color is normal.    HEENT:   [place]/AT, no JVD, normal carotids, neck is supple  Lungs: clear, scaley rash on her back   Heart: Irreg. irreg  Abdomen: + BS, non tender, no HSM  Extremities:  No c/c/e  Neuro:  Non focal     ECG: A-Fib with controlled ventricular response  Assessment / Plan:

## 2010-12-08 NOTE — Assessment & Plan Note (Signed)
Flow status post cardioversion but unfortunate back into atrial fibrillation. Her rate is well controlled and she's not having any specific symptoms. We'll continue with her current medications include including Coumadin. I'll see her in 6 month. She'll continue to followup in Coumadin clinic.  She developed a slight rash possibly do to an allergic reaction to the cardioversion patches. She's been using cortisone cream. We'll have her see [facility] for further advice regarding this itchy rash.

## 2010-12-11 ENCOUNTER — Other Ambulatory Visit: Payer: Self-pay | Admitting: Family Medicine

## 2010-12-11 DIAGNOSIS — Z1231 Encounter for screening mammogram for malignant neoplasm of breast: Secondary | ICD-10-CM

## 2010-12-12 ENCOUNTER — Telehealth: Payer: Self-pay | Admitting: *Deleted

## 2010-12-12 NOTE — Telephone Encounter (Signed)
3 month f/u canceled  was seen last week.

## 2010-12-15 ENCOUNTER — Ambulatory Visit: Payer: Medicare HMO | Admitting: Cardiovascular Disease

## 2010-12-19 ENCOUNTER — Ambulatory Visit (INDEPENDENT_AMBULATORY_CARE_PROVIDER_SITE_OTHER): Payer: Medicare HMO | Admitting: Family Medicine

## 2010-12-19 DIAGNOSIS — E539 Vitamin B deficiency, unspecified: Secondary | ICD-10-CM

## 2010-12-19 MED ORDER — CYANOCOBALAMIN 1000 MCG/ML IJ SOLN
1000.0000 ug | Freq: Once | INTRAMUSCULAR | Status: AC
  Administered 2010-12-19: 1000 ug via INTRAMUSCULAR

## 2010-12-22 ENCOUNTER — Ambulatory Visit (INDEPENDENT_AMBULATORY_CARE_PROVIDER_SITE_OTHER): Payer: Medicare HMO | Admitting: *Deleted

## 2010-12-22 DIAGNOSIS — Z7901 Long term (current) use of anticoagulants: Secondary | ICD-10-CM

## 2010-12-22 DIAGNOSIS — I4891 Unspecified atrial fibrillation: Secondary | ICD-10-CM

## 2011-01-08 ENCOUNTER — Ambulatory Visit (INDEPENDENT_AMBULATORY_CARE_PROVIDER_SITE_OTHER): Payer: Medicare HMO | Admitting: *Deleted

## 2011-01-08 DIAGNOSIS — Z7901 Long term (current) use of anticoagulants: Secondary | ICD-10-CM

## 2011-01-08 DIAGNOSIS — I4891 Unspecified atrial fibrillation: Secondary | ICD-10-CM

## 2011-01-08 LAB — POCT INR: INR: 2.9

## 2011-01-15 ENCOUNTER — Ambulatory Visit
Admission: RE | Admit: 2011-01-15 | Discharge: 2011-01-15 | Disposition: A | Discharge status: Home / Self Care | Payer: Medicare HMO | Source: Ambulatory Visit | Attending: Family Medicine | Admitting: Family Medicine

## 2011-01-15 DIAGNOSIS — Z1231 Encounter for screening mammogram for malignant neoplasm of breast: Secondary | ICD-10-CM

## 2011-01-17 ENCOUNTER — Ambulatory Visit (INDEPENDENT_AMBULATORY_CARE_PROVIDER_SITE_OTHER): Payer: Medicare HMO | Admitting: Family Medicine

## 2011-01-17 ENCOUNTER — Encounter: Payer: Self-pay | Admitting: Family Medicine

## 2011-01-17 VITALS — BP 142/88 | Temp 99.0°F | Wt 153.0 lb

## 2011-01-17 DIAGNOSIS — E538 Deficiency of other specified B group vitamins: Secondary | ICD-10-CM

## 2011-01-17 DIAGNOSIS — B349 Viral infection, unspecified: Secondary | ICD-10-CM

## 2011-01-17 DIAGNOSIS — B9789 Other viral agents as the cause of diseases classified elsewhere: Secondary | ICD-10-CM

## 2011-01-17 MED ORDER — CYANOCOBALAMIN 1000 MCG/ML IJ SOLN
1000.0000 ug | Freq: Once | INTRAMUSCULAR | Status: AC
  Administered 2011-01-17: 1000 ug via INTRAMUSCULAR

## 2011-01-17 MED ORDER — AZITHROMYCIN 250 MG PO TABS: ORAL_TABLET | ORAL | Status: AC

## 2011-01-17 NOTE — Patient Instructions (Signed)
Start antibiotic if you develop any fever.

## 2011-01-17 NOTE — Progress Notes (Signed)
  Subjective:    Patient ID: [name], female    DOB: [birth year], 78 y.o.   MRN: [id number]  HPI  Acute visit. 3 history of cough which is mostly dry. Body aches, intermittent headache, sore throat, and malaise. She bought over-the-counter cough medicine but has not yet taken. She denies any nausea or vomiting. Cough is mild to moderate. She has history of atrial fibrillation on Coumadin with recent INR stable. Received flu vaccine.  Review of Systems As per history of present illness    Objective:   Physical Exam  Constitutional: She appears well-developed and well-nourished. No distress.  HENT:  Right Ear: External ear normal.  Left Ear: External ear normal.  Mouth/Throat: Oropharynx is clear and moist.  Neck: Neck supple.  Cardiovascular: Normal rate and regular rhythm.   Pulmonary/Chest: Effort normal and breath sounds normal. No respiratory distress. She has no wheezes. She has no rales.  Lymphadenopathy:    She has no cervical adenopathy.          Assessment & Plan:  Viral URI. Cough medication (over the counter) as needed. No indication for antibiotics at this time but if she develops any fever consider Zithromax.

## 2011-01-22 ENCOUNTER — Other Ambulatory Visit: Payer: Self-pay | Admitting: Cardiovascular Disease

## 2011-01-29 ENCOUNTER — Ambulatory Visit (INDEPENDENT_AMBULATORY_CARE_PROVIDER_SITE_OTHER): Payer: Medicare HMO | Admitting: *Deleted

## 2011-01-29 DIAGNOSIS — Z7901 Long term (current) use of anticoagulants: Secondary | ICD-10-CM

## 2011-01-29 DIAGNOSIS — I4891 Unspecified atrial fibrillation: Secondary | ICD-10-CM

## 2011-01-29 LAB — POCT INR: INR: 2.3

## 2011-02-01 ENCOUNTER — Ambulatory Visit (INDEPENDENT_AMBULATORY_CARE_PROVIDER_SITE_OTHER): Payer: Medicare HMO | Admitting: Family Medicine

## 2011-02-01 ENCOUNTER — Encounter: Payer: Self-pay | Admitting: Family Medicine

## 2011-02-01 VITALS — BP 142/90 | Temp 99.0°F | Wt 153.0 lb

## 2011-02-01 DIAGNOSIS — R059 Cough, unspecified: Secondary | ICD-10-CM

## 2011-02-01 DIAGNOSIS — R05 Cough: Secondary | ICD-10-CM

## 2011-02-01 DIAGNOSIS — R0989 Other specified symptoms and signs involving the circulatory and respiratory systems: Secondary | ICD-10-CM

## 2011-02-01 LAB — CBC WITH DIFFERENTIAL/PLATELET
Basophils Absolute: 0.1 K/uL (ref 0.0–0.1)
Basophils Relative: 0.9 % (ref 0.0–3.0)
Eosinophils Absolute: 0.2 K/uL (ref 0.0–0.7)
Eosinophils Relative: 2.7 % (ref 0.0–5.0)
HCT: 38.3 % (ref 36.0–46.0)
Hemoglobin: 12.8 g/dL (ref 12.0–15.0)
Lymphocytes Relative: 33.9 % (ref 12.0–46.0)
Lymphs Abs: 2.4 K/uL (ref 0.7–4.0)
MCHC: 33.3 g/dL (ref 30.0–36.0)
MCV: 96.1 fl (ref 78.0–100.0)
Monocytes Absolute: 0.8 K/uL (ref 0.1–1.0)
Monocytes Relative: 10.7 % (ref 3.0–12.0)
Neutro Abs: 3.7 K/uL (ref 1.4–7.7)
Neutrophils Relative %: 51.8 % (ref 43.0–77.0)
Platelets: 143 K/uL — ABNORMAL LOW (ref 150.0–400.0)
RBC: 3.99 Mil/uL (ref 3.87–5.11)
RDW: 14.8 % — ABNORMAL HIGH (ref 11.5–14.6)
WBC: 7.2 K/uL (ref 4.5–10.5)

## 2011-02-01 MED ORDER — BENZONATATE 200 MG PO CAPS: 200.0000 mg | ORAL_CAPSULE | Freq: Three times a day (TID) | ORAL | Status: AC | PRN

## 2011-02-01 NOTE — Progress Notes (Signed)
  Subjective:    Patient ID: [name], female    DOB: October 20, 1926, 78 y.o.   MRN: [id number]  HPI  Patient seen with persistent cough and sense of malaise. Has had some slight sore throat.  Her cough is nonproductive. She's had chills off and on but no definite fever. Bilateral earache. Intermittent nausea but no vomiting. No abdominal pain. No dysuria. Recently completed Zithromax last week. Recent INR 2.3. Overall she feels worse. She relates remote history of pneumonia several years ago. Felt somewhat similar then. Denies any increased dyspnea other than climbing stairs. No chest pains.  Atrial fibrillation on Coumadin. Recent INR as above.  Past Medical History  Diagnosis Date  . B12 DEFICIENCY 11/25/2009  . ESSENTIAL HYPERTENSION 09/30/2009  . CHF 01/27/2009  . SMALL BOWEL OBSTRUCTION 05/05/2008  . DIVERTICULITIS OF COLON 01/27/2009  . OSTEOARTHRITIS, KNEE, RIGHT 02/08/2009  . OSTEOPENIA 01/27/2009  . Left ventricular dysfunction   . Hypertension   . History of echocardiogram 07/25/2009    EF 45 to 50% with mild to moderate MR and TR  . Hemorrhoids   . Rectal bleeding   . Atrial fib/flutter, transient    Past Surgical History  Procedure Date  . Colon surgery 2006    Sigmoid colectomy for diverticulitis  . Abdominal hysterectomy 1996    TAHBSO  . Appendectomy 1951  . Tonsillectomy 1966    reports that she has never smoked. She has never used smokeless tobacco. She reports that she does not drink alcohol or use illicit drugs. family history includes Emphysema in an unspecified family member and Heart disease in her father, sister, and unspecified family member. Allergies  Allergen Reactions  . Bactrim   . Codeine   . Macrobid   . Nitrofurantoin   . Penicillins   . Sulfamethoxazole W/Trimethoprim       Review of Systems  Constitutional: Positive for chills and fatigue. Negative for fever.  Respiratory: Positive for cough. Negative for wheezing.   Cardiovascular:  Negative for chest pain, palpitations and leg swelling.  Gastrointestinal: Negative for abdominal pain and blood in stool.  Neurological: Negative for dizziness.       Objective:   Physical Exam  Constitutional: She appears well-developed and well-nourished.  HENT:  Right Ear: External ear normal.  Left Ear: External ear normal.  Mouth/Throat: Oropharynx is clear and moist.  Neck: Neck supple. No thyromegaly present.  Cardiovascular: Normal rate and regular rhythm.   Pulmonary/Chest: Effort normal. No respiratory distress. She has no wheezes.       Patient has some rales left base  Lymphadenopathy:    She has no cervical adenopathy.  Neurological: She is alert.          Assessment & Plan:  Persistent cough. Differential includes viral illness but with finding of rales left base proceed with chest x-ray and CBC with differential.  If pneumonia noted consider change to antibiotic such as Levaquin. She denies any respiratory distress with pulse oximetry 97%.  No other indicators of severity such as dehydration or increased respiratory rate.

## 2011-02-02 ENCOUNTER — Ambulatory Visit (INDEPENDENT_AMBULATORY_CARE_PROVIDER_SITE_OTHER)
Admission: RE | Admit: 2011-02-02 | Discharge: 2011-02-02 | Disposition: A | Discharge status: Home / Self Care | Payer: Medicare HMO | Source: Ambulatory Visit | Attending: Family Medicine | Admitting: Family Medicine

## 2011-02-02 DIAGNOSIS — R05 Cough: Secondary | ICD-10-CM

## 2011-02-02 DIAGNOSIS — R059 Cough, unspecified: Secondary | ICD-10-CM

## 2011-02-02 NOTE — Progress Notes (Signed)
Quick Note:  Pt informed on home VM ______ 

## 2011-02-02 NOTE — Progress Notes (Signed)
Quick Note:  Pt informed ______ 

## 2011-02-06 ENCOUNTER — Telehealth: Payer: Self-pay | Admitting: Cardiovascular Disease

## 2011-02-06 NOTE — Telephone Encounter (Signed)
New Problem   Patient requests return call regarding medication instructions, she can be reached at hm#

## 2011-02-06 NOTE — Telephone Encounter (Signed)
Reinforced to take metoprolol 12 hours apart, Pt verbalized understanding. Alfonso Ramus RN

## 2011-02-13 ENCOUNTER — Ambulatory Visit (INDEPENDENT_AMBULATORY_CARE_PROVIDER_SITE_OTHER): Payer: Medicare HMO | Admitting: Family

## 2011-02-13 ENCOUNTER — Encounter: Payer: Self-pay | Admitting: Family

## 2011-02-13 VITALS — BP 140/90 | Temp 98.7°F | Wt 152.0 lb

## 2011-02-13 DIAGNOSIS — R110 Nausea: Secondary | ICD-10-CM

## 2011-02-13 DIAGNOSIS — K625 Hemorrhage of anus and rectum: Secondary | ICD-10-CM

## 2011-02-13 DIAGNOSIS — Z7901 Long term (current) use of anticoagulants: Secondary | ICD-10-CM

## 2011-02-13 DIAGNOSIS — I4891 Unspecified atrial fibrillation: Secondary | ICD-10-CM

## 2011-02-13 LAB — CBC
MCHC: 33.7 g/dL (ref 30.0–36.0)
Platelets: 125 10*3/uL — ABNORMAL LOW (ref 150.0–400.0)
WBC: 5.9 10*3/uL (ref 4.5–10.5)

## 2011-02-13 LAB — POCT INR: INR: 2.8

## 2011-02-13 MED ORDER — PROMETHAZINE HCL 12.5 MG PO TABS: 12.5000 mg | ORAL_TABLET | Freq: Three times a day (TID) | ORAL | Status: DC | PRN

## 2011-02-13 MED ORDER — HYDROCORTISONE 2.5 % RE CREA: TOPICAL_CREAM | Freq: Two times a day (BID) | RECTAL | Status: DC

## 2011-02-13 NOTE — Patient Instructions (Addendum)
Hemorrhoids Hemorrhoids are enlarged (dilated) veins around the rectum. There are 2 types of hemorrhoids, and the type of hemorrhoid is determined by its location. Internal hemorrhoids occur in the veins just inside the rectum.They are usually not painful, but they may bleed.However, they may poke through to the outside and become irritated and painful. External hemorrhoids involve the veins outside the anus and can be felt as a painful swelling or hard lump near the anus.They are often itchy and may crack and bleed. Sometimes clots will form in the veins. This makes them swollen and painful. These are called thrombosed hemorrhoids. CAUSES Causes of hemorrhoids include:  Pregnancy. This increases the pressure in the hemorrhoidal veins.   Constipation.   Straining to have a bowel movement.   Obesity.   Heavy lifting or other activity that caused you to strain.  TREATMENT Most of the time hemorrhoids improve in 1 to 2 weeks. However, if symptoms do not seem to be getting better or if you have a lot of rectal bleeding, your caregiver may perform a procedure to help make the hemorrhoids get smaller or remove them completely.Possible treatments include:  Rubber band ligation. A rubber band is placed at the base of the hemorrhoid to cut off the circulation.   Sclerotherapy. A chemical is injected to shrink the hemorrhoid.   Infrared light therapy. Tools are used to burn the hemorrhoid.   Hemorrhoidectomy. This is surgical removal of the hemorrhoid.  HOME CARE INSTRUCTIONS   Increase fiber in your diet. Ask your caregiver about using fiber supplements.   Drink enough water and fluids to keep your urine clear or pale yellow.   Exercise regularly.   Go to the bathroom when you have the urge to have a bowel movement. Do not wait.   Avoid straining to have bowel movements.   Keep the anal area dry and clean.   Only take over-the-counter or prescription medicines for pain, discomfort,  or fever as directed by your caregiver.  If your hemorrhoids are thrombosed:  Take warm sitz baths for 20 to 30 minutes, 3 to 4 times per day.   If the hemorrhoids are very tender and swollen, place ice packs on the area as tolerated. Using ice packs between sitz baths may be helpful. Fill a plastic bag with ice. Place a towel between the bag of ice and your skin.   Medicated creams and suppositories may be used or applied as directed.   Do not use a donut-shaped pillow or sit on the toilet for long periods. This increases blood pooling and pain.  SEEK MEDICAL CARE IF:   You have increasing pain and swelling that is not controlled with your medicine.   You have uncontrolled bleeding.   You have difficulty or you are unable to have a bowel movement.   You have pain or inflammation outside the area of the hemorrhoids.   You have chills or an oral temperature above 102 F (38.9 C).  MAKE SURE YOU:   Understand these instructions.   Will watch your condition.   Will get help right away if you are not doing well or get worse.  Document Released: 12/23/1999 Document Revised: 09/06/2010 Document Reviewed: 04/29/2007 [facility] Patient Information 2012 [place], [place].    Latest dosing instructions   Total Sun Mon Tue Wed Thu Fri Sat   40 5 mg 7.5 mg 5 mg 5 mg 5 mg 7.5 mg 5 mg    (5 mg1) (5 mg1.5) (5 mg1) (5 mg1) (5  mg1) (5 mg1.5) (5 mg1)

## 2011-02-13 NOTE — Progress Notes (Signed)
Subjective:    Patient ID: [name], female    DOB: 12-19-1926, 78 y.o.   MRN: [id number]  HPI Comments: C/o bright red blood stool x three started yesterday with mild abdominal cramping and nausea. Has history of internal hemorrhoids. Last colonoscopy normal last year. Denies diarrhea, abdominal pain, or constipation. Has normal bowel pattern. Takes coumadin 7.5mg  on Mon and Fri, last INR 2.3 on 01-29-11.      Review of Systems  Constitutional: Negative.   Respiratory: Negative.   Cardiovascular: Negative.   Gastrointestinal: Positive for nausea, blood in stool and anal bleeding. Negative for vomiting, abdominal pain, diarrhea, constipation, abdominal distention and rectal pain.  Genitourinary: Negative.    Past Medical History  Diagnosis Date  . B12 DEFICIENCY 11/25/2009  . ESSENTIAL HYPERTENSION 09/30/2009  . CHF 01/27/2009  . SMALL BOWEL OBSTRUCTION 05/05/2008  . DIVERTICULITIS OF COLON 01/27/2009  . OSTEOARTHRITIS, KNEE, RIGHT 02/08/2009  . OSTEOPENIA 01/27/2009  . Left ventricular dysfunction   . Hypertension   . History of echocardiogram 07/25/2009    EF 45 to 50% with mild to moderate MR and TR  . Hemorrhoids   . Rectal bleeding   . Atrial fib/flutter, transient     History   Social History  . Marital Status: Widowed    Spouse Name: N/A    Number of Children: N/A  . Years of Education: N/A   Occupational History  . Not on file.   Social History Main Topics  . Smoking status: Never Smoker   . Smokeless tobacco: Never Used  . Alcohol Use: No  . Drug Use: No  . Sexually Active: Not on file   Other Topics Concern  . Not on file   Social History Narrative  . No narrative on file    Past Surgical History  Procedure Date  . Colon surgery 2006    Sigmoid colectomy for diverticulitis  . Abdominal hysterectomy 1996    TAHBSO  . Appendectomy 1951  . Tonsillectomy 1966    Family History  Problem Relation Age of Onset  . Heart disease    . Emphysema     . Heart disease Father   . Heart disease Sister     Allergies  Allergen Reactions  . Bactrim   . Codeine   . Macrobid   . Nitrofurantoin   . Penicillins   . Sulfamethoxazole W/Trimethoprim     Current Outpatient Prescriptions on File Prior to Visit  Medication Sig Dispense Refill  . furosemide (LASIX) 40 MG tablet Take 40 mg by mouth daily.       Marland Kitchen loratadine (CLARITIN) 10 MG tablet Take 10 mg by mouth daily.        Marland Kitchen losartan (COZAAR) 50 MG tablet TAKE ONE TABLET BY MOUTH EVERY DAY  30 tablet  11  . metoprolol (LOPRESSOR) 50 MG tablet 1 and 1/2 tab twice daily  90 tablet  6  . Multiple Vitamin (MULTIVITAMIN) tablet Take 1 tablet by mouth daily.        . Multiple Vitamins-Minerals (ICAPS PO) Take by mouth.        . potassium chloride (KLOR-CON) 10 MEQ CR tablet Take 1 tablet (10 mEq total) by mouth daily.  30 tablet  5  . warfarin (COUMADIN) 5 MG tablet Take as directed by Anticoagulation clinic.  Pt takes up to 1 1/2 tablets daily.  45 tablet  3  . DISCONTD: metoprolol (LOPRESSOR) 50 MG tablet Take 1 tablet (50 mg total) by mouth 2 (two)  times daily.  60 tablet  5   Current Facility-Administered Medications on File Prior to Visit  Medication Dose Route Frequency Provider Last Rate Last Dose  . DISCONTD: cyanocobalamin ((VITAMIN B-12)) injection 1,000 mcg  1,000 mcg Intramuscular Once [name], MD        BP 140/90  Temp(Src) 98.7 F (37.1 C) (Oral)  Wt 152 lb (68.947 kg)chart     Objective:   Physical Exam  Constitutional: She is oriented to person, place, and time. She appears well-developed and well-nourished. No distress.  Cardiovascular: Normal rate, regular rhythm, normal heart sounds and intact distal pulses.  Exam reveals no gallop and no friction rub.   No murmur heard. Pulmonary/Chest: Effort normal and breath sounds normal. No respiratory distress. She has no wheezes. She has no rales. She exhibits no tenderness.  Abdominal: Soft. Bowel sounds are  normal. She exhibits no distension and no mass. There is no tenderness. There is no rebound and no guarding.  Genitourinary:     Neurological: She is alert and oriented to person, place, and time.  Skin: Skin is warm and dry. She is not diaphoretic.          Assessment & Plan:  Assessment: External Hemorrhoids, Internal Hemorrhoids  Plan: PT/INR, CBC, BMP sent. Will follow-up pending results. Proctosol suppository twice daily. Phenergan prn. Patient to be referred back to GI.

## 2011-02-14 LAB — BASIC METABOLIC PANEL
BUN: 27 mg/dL — ABNORMAL HIGH (ref 6–23)
Chloride: 102 mEq/L (ref 96–112)
Potassium: 4.4 mEq/L (ref 3.5–5.1)

## 2011-02-23 ENCOUNTER — Other Ambulatory Visit: Payer: Self-pay | Admitting: Cardiovascular Disease

## 2011-02-26 ENCOUNTER — Encounter: Payer: Medicare HMO | Admitting: *Deleted

## 2011-02-28 ENCOUNTER — Ambulatory Visit (INDEPENDENT_AMBULATORY_CARE_PROVIDER_SITE_OTHER): Payer: Medicare HMO | Admitting: Pharmacist

## 2011-02-28 DIAGNOSIS — I4891 Unspecified atrial fibrillation: Secondary | ICD-10-CM

## 2011-02-28 DIAGNOSIS — Z7901 Long term (current) use of anticoagulants: Secondary | ICD-10-CM

## 2011-02-28 MED ORDER — WARFARIN SODIUM 5 MG PO TABS: ORAL_TABLET | ORAL | Status: DC

## 2011-03-02 ENCOUNTER — Ambulatory Visit (INDEPENDENT_AMBULATORY_CARE_PROVIDER_SITE_OTHER): Payer: Medicare HMO | Admitting: Family Medicine

## 2011-03-02 ENCOUNTER — Encounter: Payer: Self-pay | Admitting: Family Medicine

## 2011-03-02 VITALS — BP 120/90 | Temp 98.0°F | Wt 156.0 lb

## 2011-03-02 DIAGNOSIS — I4891 Unspecified atrial fibrillation: Secondary | ICD-10-CM

## 2011-03-02 DIAGNOSIS — E538 Deficiency of other specified B group vitamins: Secondary | ICD-10-CM

## 2011-03-02 DIAGNOSIS — K644 Residual hemorrhoidal skin tags: Secondary | ICD-10-CM

## 2011-03-02 MED ORDER — CYANOCOBALAMIN 1000 MCG/ML IJ SOLN
1000.0000 ug | Freq: Once | INTRAMUSCULAR | Status: AC
  Administered 2011-03-02: 1000 ug via INTRAMUSCULAR

## 2011-03-02 MED ORDER — HYDROCORTISONE ACETATE 25 MG RE SUPP: 25.0000 mg | Freq: Two times a day (BID) | RECTAL | Status: DC

## 2011-03-02 NOTE — Patient Instructions (Signed)

## 2011-03-02 NOTE — Progress Notes (Signed)
  Subjective:    Patient ID: [name], female    DOB: 03/21/1926, 78 y.o.   MRN: [id number]  HPI  Intermittent bright red blood per rectum over the past 2-1/2 weeks. She notices bright blood with wiping and bowel movements. No vaginal bleeding. Previous hysterectomy. She had colonoscopy 2010 which revealed internal hemorrhoids and ascending colon AVM. Patient denies any diarrhea, constipation, perianal pain, or abdominal pain. No appetite or weight changes. Recent CBC hemoglobin stable 12.3 with platelet count 125,000. Patient takes Coumadin for atrial fibrillation. INR 2 days ago 3.2. No bruising or other bleeding problems. Patient has a long history of intermittent rectal bleeding related to hemorrhoids. She's tried various topicals. No dizziness.  Past Medical History  Diagnosis Date  . B12 DEFICIENCY 11/25/2009  . ESSENTIAL HYPERTENSION 09/30/2009  . CHF 01/27/2009  . SMALL BOWEL OBSTRUCTION 05/05/2008  . DIVERTICULITIS OF COLON 01/27/2009  . OSTEOARTHRITIS, KNEE, RIGHT 02/08/2009  . OSTEOPENIA 01/27/2009  . Left ventricular dysfunction   . Hypertension   . History of echocardiogram 07/25/2009    EF 45 to 50% with mild to moderate MR and TR  . Hemorrhoids   . Rectal bleeding   . Atrial fib/flutter, transient    Past Surgical History  Procedure Date  . Colon surgery 2006    Sigmoid colectomy for diverticulitis  . Abdominal hysterectomy 1996    TAHBSO  . Appendectomy 1951  . Tonsillectomy 1966    reports that she has never smoked. She has never used smokeless tobacco. She reports that she does not drink alcohol or use illicit drugs. family history includes Emphysema in an unspecified family member and Heart disease in her father, sister, and unspecified family member. Allergies  Allergen Reactions  . Bactrim   . Codeine   . Macrobid   . Nitrofurantoin   . Penicillins   . Sulfamethoxazole W/Trimethoprim      Review of Systems  Constitutional: Negative for fever, chills,  appetite change and unexpected weight change.  Respiratory: Negative for shortness of breath.   Cardiovascular: Negative for chest pain, palpitations and leg swelling.  Gastrointestinal: Positive for blood in stool. Negative for nausea, vomiting, abdominal pain, diarrhea and constipation.  Genitourinary: Negative for dysuria.  Musculoskeletal: Negative for back pain.  Neurological: Negative for dizziness and syncope.  Hematological: Does not bruise/bleed easily.       Objective:   Physical Exam  Constitutional: She is oriented to person, place, and time. She appears well-developed and well-nourished.  Cardiovascular: Normal rate and regular rhythm.   Pulmonary/Chest: Effort normal and breath sounds normal. No respiratory distress. She has no wheezes. She has no rales.  Abdominal: Bowel sounds are normal. She exhibits no distension and no mass. There is no tenderness. There is no rebound and no guarding.  Genitourinary:       Patient has very inflamed external hemorrhoids. No active bleeding. No clear fissure  Neurological: She is alert and oriented to person, place, and time.  Psychiatric: She has a normal mood and affect. Her behavior is normal.          Assessment & Plan:  #1 recurrent hematochezia. Very likely related to inflamed hemorrhoids. Try hydrocortisone 25 mg suppository. Measures to reduce constipation. We have discussed possible referral for surgical options at this point would recommend observation #2 chronic atrial fibrillation rate controlled. Continue Coumadin. Close followup of INR

## 2011-03-23 ENCOUNTER — Ambulatory Visit (INDEPENDENT_AMBULATORY_CARE_PROVIDER_SITE_OTHER): Payer: Medicare HMO | Admitting: Family Medicine

## 2011-03-23 DIAGNOSIS — E538 Deficiency of other specified B group vitamins: Secondary | ICD-10-CM

## 2011-03-23 MED ORDER — CYANOCOBALAMIN 1000 MCG/ML IJ SOLN
1000.0000 ug | Freq: Once | INTRAMUSCULAR | Status: AC
  Administered 2011-03-23: 1000 ug via INTRAMUSCULAR

## 2011-03-28 ENCOUNTER — Ambulatory Visit (INDEPENDENT_AMBULATORY_CARE_PROVIDER_SITE_OTHER): Payer: Medicare HMO | Admitting: *Deleted

## 2011-03-28 DIAGNOSIS — Z7901 Long term (current) use of anticoagulants: Secondary | ICD-10-CM

## 2011-03-28 DIAGNOSIS — I4891 Unspecified atrial fibrillation: Secondary | ICD-10-CM

## 2011-03-28 LAB — POCT INR: INR: 3.3

## 2011-03-30 ENCOUNTER — Other Ambulatory Visit: Payer: Self-pay | Admitting: Family Medicine

## 2011-04-11 ENCOUNTER — Ambulatory Visit (INDEPENDENT_AMBULATORY_CARE_PROVIDER_SITE_OTHER): Payer: Medicare HMO | Admitting: Pharmacist

## 2011-04-11 DIAGNOSIS — Z7901 Long term (current) use of anticoagulants: Secondary | ICD-10-CM

## 2011-04-11 DIAGNOSIS — I4891 Unspecified atrial fibrillation: Secondary | ICD-10-CM

## 2011-04-11 LAB — POCT INR: INR: 2.9

## 2011-04-14 ENCOUNTER — Other Ambulatory Visit: Payer: Self-pay | Admitting: Cardiovascular Disease

## 2011-04-16 ENCOUNTER — Other Ambulatory Visit: Payer: Self-pay | Admitting: Family Medicine

## 2011-04-27 ENCOUNTER — Ambulatory Visit (INDEPENDENT_AMBULATORY_CARE_PROVIDER_SITE_OTHER): Payer: Medicare HMO | Admitting: Family Medicine

## 2011-04-27 DIAGNOSIS — E538 Deficiency of other specified B group vitamins: Secondary | ICD-10-CM

## 2011-04-27 DIAGNOSIS — R079 Chest pain, unspecified: Secondary | ICD-10-CM

## 2011-04-27 MED ORDER — CYANOCOBALAMIN 1000 MCG/ML IJ SOLN
1000.0000 ug | Freq: Once | INTRAMUSCULAR | Status: AC
  Administered 2011-04-27: 1000 ug via INTRAMUSCULAR

## 2011-05-02 ENCOUNTER — Ambulatory Visit (INDEPENDENT_AMBULATORY_CARE_PROVIDER_SITE_OTHER): Payer: Medicare HMO | Admitting: *Deleted

## 2011-05-02 DIAGNOSIS — Z7901 Long term (current) use of anticoagulants: Secondary | ICD-10-CM

## 2011-05-02 DIAGNOSIS — I4891 Unspecified atrial fibrillation: Secondary | ICD-10-CM

## 2011-05-23 ENCOUNTER — Ambulatory Visit (INDEPENDENT_AMBULATORY_CARE_PROVIDER_SITE_OTHER): Payer: Medicare HMO | Admitting: *Deleted

## 2011-05-23 DIAGNOSIS — I4891 Unspecified atrial fibrillation: Secondary | ICD-10-CM

## 2011-05-23 DIAGNOSIS — Z7901 Long term (current) use of anticoagulants: Secondary | ICD-10-CM

## 2011-05-25 ENCOUNTER — Ambulatory Visit (INDEPENDENT_AMBULATORY_CARE_PROVIDER_SITE_OTHER): Payer: Medicare HMO | Admitting: Family Medicine

## 2011-05-25 DIAGNOSIS — E538 Deficiency of other specified B group vitamins: Secondary | ICD-10-CM

## 2011-05-25 MED ORDER — CYANOCOBALAMIN 1000 MCG/ML IJ SOLN: 1000.0000 ug | Freq: Once | INTRAMUSCULAR | Status: DC

## 2011-05-26 ENCOUNTER — Emergency Department (HOSPITAL_COMMUNITY): Payer: Medicare HMO

## 2011-05-26 ENCOUNTER — Encounter (HOSPITAL_COMMUNITY): Payer: Self-pay | Admitting: *Deleted

## 2011-05-26 ENCOUNTER — Inpatient Hospital Stay (HOSPITAL_COMMUNITY)
Admission: EM | Admit: 2011-05-26 | Discharge: 2011-05-29 | DRG: 149 | Disposition: A | Discharge status: Home / Self Care | Payer: Medicare HMO | Attending: Internal Medicine | Admitting: Internal Medicine

## 2011-05-26 DIAGNOSIS — Z860100 Personal history of colon polyps, unspecified: Secondary | ICD-10-CM | POA: Insufficient documentation

## 2011-05-26 DIAGNOSIS — K5731 Diverticulosis of large intestine without perforation or abscess with bleeding: Secondary | ICD-10-CM

## 2011-05-26 DIAGNOSIS — I4891 Unspecified atrial fibrillation: Secondary | ICD-10-CM | POA: Insufficient documentation

## 2011-05-26 DIAGNOSIS — R42 Dizziness and giddiness: Secondary | ICD-10-CM

## 2011-05-26 DIAGNOSIS — M949 Disorder of cartilage, unspecified: Secondary | ICD-10-CM | POA: Diagnosis present

## 2011-05-26 DIAGNOSIS — Z9049 Acquired absence of other specified parts of digestive tract: Secondary | ICD-10-CM

## 2011-05-26 DIAGNOSIS — I509 Heart failure, unspecified: Secondary | ICD-10-CM | POA: Diagnosis present

## 2011-05-26 DIAGNOSIS — I10 Essential (primary) hypertension: Secondary | ICD-10-CM | POA: Diagnosis present

## 2011-05-26 DIAGNOSIS — K766 Portal hypertension: Secondary | ICD-10-CM

## 2011-05-26 DIAGNOSIS — K644 Residual hemorrhoidal skin tags: Secondary | ICD-10-CM | POA: Diagnosis present

## 2011-05-26 DIAGNOSIS — Z7901 Long term (current) use of anticoagulants: Secondary | ICD-10-CM

## 2011-05-26 DIAGNOSIS — M899 Disorder of bone, unspecified: Secondary | ICD-10-CM | POA: Diagnosis present

## 2011-05-26 DIAGNOSIS — R110 Nausea: Secondary | ICD-10-CM | POA: Diagnosis present

## 2011-05-26 DIAGNOSIS — E876 Hypokalemia: Secondary | ICD-10-CM | POA: Diagnosis present

## 2011-05-26 DIAGNOSIS — R51 Headache: Secondary | ICD-10-CM | POA: Diagnosis present

## 2011-05-26 LAB — PROTIME-INR
INR: 1.51 — ABNORMAL HIGH (ref 0.00–1.49)
Prothrombin Time: 18.5 seconds — ABNORMAL HIGH (ref 11.6–15.2)

## 2011-05-26 LAB — COMPREHENSIVE METABOLIC PANEL
ALT: 34 U/L (ref 0–35)
AST: 30 U/L (ref 0–37)
Alkaline Phosphatase: 86 U/L (ref 39–117)
GFR calc Af Amer: 66 mL/min — ABNORMAL LOW (ref >90)
Glucose, Bld: 107 mg/dL — ABNORMAL HIGH (ref 70–99)
Potassium: 3.9 mEq/L (ref 3.5–5.1)
Sodium: 143 mEq/L (ref 135–145)
Total Protein: 6.9 g/dL (ref 6.0–8.3)

## 2011-05-26 LAB — DIFFERENTIAL
Basophils Absolute: 0 10*3/uL (ref 0.0–0.1)
Eosinophils Absolute: 0.1 10*3/uL (ref 0.0–0.7)
Lymphocytes Relative: 40 % (ref 12–46)
Lymphs Abs: 2.3 10*3/uL (ref 0.7–4.0)
Neutrophils Relative %: 49 % (ref 43–77)

## 2011-05-26 LAB — CBC
Platelets: 147 10*3/uL — ABNORMAL LOW (ref 150–400)
RBC: 4.46 MIL/uL (ref 3.87–5.11)
RDW: 13 % (ref 11.5–15.5)
WBC: 5.8 10*3/uL (ref 4.0–10.5)

## 2011-05-26 LAB — GLUCOSE, CAPILLARY

## 2011-05-26 MED ORDER — ONDANSETRON HCL 4 MG/2ML IJ SOLN
4.0000 mg | Freq: Once | INTRAMUSCULAR | Status: AC
  Administered 2011-05-26: 4 mg via INTRAVENOUS
  Filled 2011-05-26: qty 2

## 2011-05-26 MED ORDER — MECLIZINE HCL 25 MG PO TABS
25.0000 mg | ORAL_TABLET | Freq: Once | ORAL | Status: AC
  Administered 2011-05-26: 25 mg via ORAL
  Filled 2011-05-26: qty 1

## 2011-05-26 NOTE — ED Provider Notes (Signed)
History     CSN: [id number] Arrival date & time 05/26/11  1550 First MD Initiated Contact with Patient 05/26/11 1611    No chief complaint on file.  HPI Yesterday she developed a headache and dizziness. The symptoms got worse.  She tried taking nausea medications but it did not help. Pt feels like whenever she moves her head the dizziness would get worse.  Some nausea but no vomiting.  No fevers, or cough.  No falls. No diarrhea.  She has had some abdominal cramping.   Her doctor told her in the past because of her sensitive heart she should come to the ED if she does not feel well.  She feels weak all over.  No trouble with her speech.  No falls recently.    Past Medical History  Diagnosis Date  . B12 DEFICIENCY 11/25/2009  . ESSENTIAL HYPERTENSION 09/30/2009  . CHF 01/27/2009  . SMALL BOWEL OBSTRUCTION 05/05/2008  . DIVERTICULITIS OF COLON 01/27/2009  . OSTEOARTHRITIS, KNEE, RIGHT 02/08/2009  . OSTEOPENIA 01/27/2009  . Left ventricular dysfunction   . Hypertension   . History of echocardiogram 07/25/2009    EF 45 to 50% with mild to moderate MR and TR  . Hemorrhoids   . Rectal bleeding   . Atrial fib/flutter, transient     Past Surgical History  Procedure Date  . Colon surgery 2006    Sigmoid colectomy for diverticulitis  . Abdominal hysterectomy 1996    TAHBSO  . Appendectomy 1951  . Tonsillectomy 1966    Family History  Problem Relation Age of Onset  . Heart disease    . Emphysema    . Heart disease Father   . Heart disease Sister     History  Substance Use Topics  . Smoking status: Never Smoker   . Smokeless tobacco: Never Used  . Alcohol Use: No    OB History    Grav Para Term Preterm Abortions TAB SAB Ect Mult Living                  Review of Systems  All other systems reviewed and are negative.    Allergies  Bactrim; Codeine; Nitrofurantoin; Nitrofurantoin monohyd macro; Penicillins; and Sulfamethoxazole w-trimethoprim  Home Medications   Current  Outpatient Rx  Name Route Sig Dispense Refill  . FUROSEMIDE 40 MG PO TABS Oral Take 40 mg by mouth daily.     Marland Kitchen HYDROCORTISONE ACETATE 25 MG RE SUPP  INSERT ONE SUPPOSITORY RECTALLY TWICE DAILY 20 suppository 0  . HYDROCORTISONE 2.5 % RE CREA Rectal Place rectally 2 (two) times daily. 30 g 2  . LORATADINE 10 MG PO TABS Oral Take 10 mg by mouth daily.      Marland Kitchen LOSARTAN POTASSIUM 50 MG PO TABS Oral Take 50 mg by mouth daily.    Marland Kitchen METOPROLOL TARTRATE 50 MG PO TABS Oral Take 75 mg by mouth 2 (two) times daily.    Marland Kitchen ONE-DAILY MULTI VITAMINS PO TABS Oral Take 1 tablet by mouth daily.      . ICAPS PO Oral Take by mouth.      Marland Kitchen POTASSIUM CHLORIDE CRYS ER 10 MEQ PO TBCR Oral Take 10 mEq by mouth 2 (two) times daily.    Marland Kitchen PROMETHAZINE HCL 12.5 MG PO TABS Oral Take 12.5 mg by mouth every 6 (six) hours as needed. For nausea    . WARFARIN SODIUM 5 MG PO TABS  Take as directed by Anticoagulation clinic.  Pt takes up to 1  1/2 tablets daily.    Marland Kitchen HYDROCORTISONE ACETATE 25 MG RE SUPP Rectal Place 1 suppository (25 mg total) rectally 2 (two) times daily. 20 suppository 1  . PROMETHAZINE HCL 12.5 MG PO TABS Oral Take 1 tablet (12.5 mg total) by mouth every 8 (eight) hours as needed for nausea. 20 tablet 0    BP 161/96  Pulse 97  Temp(Src) 99.3 F (37.4 C) (Oral)  Resp 18  SpO2 97%  Physical Exam  Nursing note and vitals reviewed. Constitutional: She is oriented to person, place, and time. She appears well-developed and well-nourished. No distress.  HENT:  Head: Normocephalic and atraumatic.  Right Ear: External ear normal.  Left Ear: External ear normal.  Mouth/Throat: Oropharynx is clear and moist.  Eyes: Conjunctivae are normal. Right eye exhibits no discharge. Left eye exhibits no discharge. No scleral icterus.  Neck: Neck supple. No tracheal deviation present.  Cardiovascular: Normal rate, regular rhythm and intact distal pulses.   Pulmonary/Chest: Effort normal and breath sounds normal. No  stridor. No respiratory distress. She has no wheezes. She has no rales.  Abdominal: Soft. Bowel sounds are normal. She exhibits no distension. There is no tenderness. There is no rebound and no guarding.  Musculoskeletal: She exhibits no edema and no tenderness.  Neurological: She is alert and oriented to person, place, and time. She has normal strength. No cranial nerve deficit ( no gross defecits noted) or sensory deficit. She exhibits normal muscle tone. She displays no seizure activity. Coordination normal.       No pronator drift bilateral upper extrem, able to hold both legs off bed for 5 seconds, sensation intact in all extremities, no visual field cuts, no left or right sided neglect  Skin: Skin is warm and dry. No rash noted.  Psychiatric: She has a normal mood and affect.    ED Course  Procedures (including critical care time)  Rate: 87  Rhythm: atrial fibrillation  QRS Axis: normal  Intervals: incomplete LBBB  ST/T Wave abnormalities: normal  Conduction Disutrbances:none  Narrative Interpretation: probable LVH with repol abnormality  Old EKG Reviewed: a fib new  Labs Reviewed  PROTIME-INR - Abnormal; Notable for the following:    Prothrombin Time 18.5 (*)    INR 1.51 (*)    All other components within normal limits  APTT - Abnormal; Notable for the following:    aPTT 57 (*)    All other components within normal limits  CBC - Abnormal; Notable for the following:    Platelets 147 (*)    All other components within normal limits  COMPREHENSIVE METABOLIC PANEL - Abnormal; Notable for the following:    CO2 33 (*)    Glucose, Bld 107 (*)    GFR calc non Af Amer 57 (*)    GFR calc Af Amer 66 (*)    All other components within normal limits  DIFFERENTIAL  TROPONIN I  GLUCOSE, CAPILLARY   Ct Head Wo Contrast  05/26/2011  *RADIOLOGY REPORT*  Clinical Data: Dizziness  CT HEAD WITHOUT CONTRAST  Technique:  Contiguous axial images were obtained from the base of the skull  through the vertex without contrast.  Comparison: None.  Findings: Generalized atrophy.  Chronic microvascular ischemic changes in the white matter.  No acute infarct.  Negative for hemorrhage or mass.  Calvarium is intact.  IMPRESSION: Atrophy and chronic microvascular ischemia.  No acute abnormality.  Original Report Authenticated By: Camelia Phenes, M.D.     MDM  The patient is  complaining of persistent dizziness and unsteadiness. We attempted to get her to ambulate she was unable to do so. There is no evidence of hypotension or anemia or other metabolic etiology to cause her symptoms. It is possible she could be having peripheral vertigo but with her inability to ambulate and concerned about the possibility of a posterior circulation stroke. Will consult the hospitalist for admission and further evaluation. Patient with probably benefit from  an MRI in the morning        Celene Kras, MD 05/26/11 2059

## 2011-05-26 NOTE — ED Notes (Addendum)
PIV attempt x 2, second RN asked to attempt

## 2011-05-26 NOTE — ED Notes (Signed)
Not in room

## 2011-05-26 NOTE — H&P (Signed)
PCP:   [name], MD, MD    Chief Complaint:  Dizzy  Took her grandhcildren to eat yesterday and the room started to spin when she lay her head on the pillow last night .  Felt sweaty and nausea,  Was dizzy and almost lost her balance today when she looked up to hang a towel above her head in the tub.  everytime she got dizzy she would have pain above the bi-temporal areas of the head but these were transient type headaches but did not last long.Rochele Pages some promethazine that she had in the past for nausea as she developed nausea that came on with her toxin producing this The dizzyness is worse when she lay on her R side and worsened-and lasts 2 min and then comes back.  She goes on to [place] cannot predict when she has dizzy spells and sometimes is associated with motion and worsened by motion however sometimes she just gets attacks of this out of the blue. States has some generalized wekaness as well and states she couldn't do her usual activities, no one sideof the body weakness noted.    Notes that she bends her head forward she has pretty bad dizzyness.  No specific slurring of speech, no seizures, no Loc Her son is her Toprol to hold it did have to care for her at her lunch and because her symptoms persisted and she presented to the emergency room for further workup Review of Systems:  The patient denies anorexia, fever, weight loss, +vision loss-AVI, decreased hearing-no recent cough, cold but bad allergies and is acting up-some ringing in her ears as well which stopped, hoarseness, chest pain, syncope, dyspnea on exertion, peripheral edema, balance deficits, hemoptysis, some abdominal pain, no current blood in stool-Had some blood in stool, 3 days ago with spotting, severe indigestion/heartburn, hematuria, incontinence, genital sores, muscle weakness, suspicious skin lesions, transient blindness, difficulty walking, depression, unusual weight change, abnormal bleeding, enlarged lymph  nodes, angioedema, and breast masses.    Past Medical History: Arthritis + osteopenia-axis November 2010-did not tolerate the Fosamax History of colonic polyps-colonoscopy May 2000 significant for internal hemorrhoids, cecal polyp with tubular adenoma History of shingles and [place]-intolerant to multiple nerve type meds Pneumonia in 2005 Colectomy 2006 for diverticulitis H/o Atrial fibrillation that developed during hospitalization 08/2010-cardioverted 10/2010 H/o CHF Partial SBO 03/2008 History of hemorrhoids with no plans for surgical intervention at present HTn Thrush History of endometriosis TAH BSO 1966 Appendectomy 1951   Past surgical history: Past Surgical History  Procedure Date  . Colon surgery 2006    Sigmoid colectomy for diverticulitis  . Abdominal hysterectomy 1996    TAHBSO  . Appendectomy 1951  . Tonsillectomy 1966    Medications: Prior to Admission medications   Medication Sig Start Date End Date Taking? Authorizing Provider  furosemide (LASIX) 40 MG tablet Take 40 mg by mouth daily.  10/17/10 10/17/11 Yes Vesta Mixer, MD  hydrocortisone (ANUSOL-HC) 25 MG suppository INSERT ONE SUPPOSITORY RECTALLY TWICE DAILY 03/30/11  Yes [name], MD  hydrocortisone (PROCTOSOL HC) 2.5 % rectal cream Place rectally 2 (two) times daily. 02/13/11  Yes Baker Pierini, FNP  loratadine (CLARITIN) 10 MG tablet Take 10 mg by mouth daily.     Yes Historical Provider, MD  losartan (COZAAR) 50 MG tablet Take 50 mg by mouth daily.   Yes Historical Provider, MD  metoprolol (LOPRESSOR) 50 MG tablet Take 75 mg by mouth 2 (two) times daily. 02/23/11  Yes Loistine Chance  Earnstine Regal, MD  Multiple Vitamin (MULTIVITAMIN) tablet Take 1 tablet by mouth daily.     Yes Historical Provider, MD  Multiple Vitamins-Minerals (ICAPS PO) Take by mouth.     Yes Historical Provider, MD  potassium chloride (K-DUR,KLOR-CON) 10 MEQ tablet Take 10 mEq by mouth 2 (two) times daily.   Yes Historical Provider,  MD  promethazine (PHENERGAN) 12.5 MG tablet Take 12.5 mg by mouth every 6 (six) hours as needed. For nausea   Yes Historical Provider, MD  warfarin (COUMADIN) 5 MG tablet Take as directed by Anticoagulation clinic.  Pt takes up to 1 1/2 tablets daily. 02/28/11  Yes Vesta Mixer, MD  hydrocortisone (ANUSOL-HC) 25 MG suppository Place 1 suppository (25 mg total) rectally 2 (two) times daily. 03/02/11 03/12/11  [name], MD  promethazine (PHENERGAN) 12.5 MG tablet Take 1 tablet (12.5 mg total) by mouth every 8 (eight) hours as needed for nausea. 02/13/11 02/20/11  Baker Pierini, FNP    Allergies:   Allergies  Allergen Reactions  . Bactrim   . Codeine   . Nitrofurantoin   . Nitrofurantoin Monohyd Macro   . Penicillins   . Sulfamethoxazole W-Trimethoprim     Social History:  reports that she has never smoked. She has never used smokeless tobacco. She reports that she does not drink alcohol or use illicit drugs.  Family History: Family History  Problem Relation Age of Onset  . Heart disease    . Emphysema    . Heart disease Father   . Heart disease Sister     Physical Exam: Filed Vitals:   05/26/11 2000 05/26/11 2015 05/26/11 2030 05/26/11 2045  BP: 160/85 122/87 145/91 142/80  Pulse: 77 79 84 77  Temp:      TempSrc:      Resp: 20 16 17 16   SpO2: 96% 96% 94% 92%    HEENT-anxious but pleasant Caucasian female in no apparent distress CHEST-clinically clear no outer sounds no tactile vocal resonance or fremitus CARDS-S1-S2 irregularly irregular pulse.  Some mild jugular venous distention. No bruit ABD-soft, nontender and nondistended no rebound or guarding SKIN-trace lower extremity edema, thickened nails lower extremity. Good dorsalis pedis felt.  NEURO-alert, oriented x3 speech: normal in context and clarity memory: intact grossly cranial nerves II-XII: intact motor strength: full proximally and distally sensation: intact to vibration, pain, and light  touch cerebellar: finger-to-nose and heel-to-shin intact reflexes: full and symmetric plantar responses: downgoing bilaterally Gait not assessed   Labs on Admission:   [facility] 05/26/11 2000  NA 143  K 3.9  CL 103  CO2 33*  GLUCOSE 107*  BUN 17  CREATININE 0.91  CALCIUM 9.1  MG --  PHOS --    Basename 05/26/11 2000  AST 30  ALT 34  ALKPHOS 86  BILITOT 1.2  PROT 6.9  ALBUMIN 3.9   No results found for this basename: LIPASE:2,AMYLASE:2 in the last 72 hours  Basename 05/26/11 2000  WBC 5.8  NEUTROABS 2.8  HGB 14.3  HCT 42.8  MCV 96.0  PLT 147*    Basename 05/26/11 2000  CKTOTAL --  CKMB --  CKMBINDEX --  TROPONINI <0.30   No results found for this basename: TSH,T4TOTAL,FREET3,T3FREE,THYROIDAB in the last 72 hours No results found for this basename: VITAMINB12:2,FOLATE:2,FERRITIN:2,TIBC:2,IRON:2,RETICCTPCT:2 in the last 72 hours  Radiological Exams on Admission: Ct Head Wo Contrast  05/26/2011  *RADIOLOGY REPORT*  Clinical Data: Dizziness  CT HEAD WITHOUT CONTRAST  Technique:  Contiguous axial images were obtained from  the base of the skull through the vertex without contrast.  Comparison: None.  Findings: Generalized atrophy.  Chronic microvascular ischemic changes in the white matter.  No acute infarct.  Negative for hemorrhage or mass.  Calvarium is intact.  IMPRESSION: Atrophy and chronic microvascular ischemia.  No acute abnormality.  Original Report Authenticated By: Camelia Phenes, M.D.    Assessment/Plan 1 vertigo-patient is on multiple medications that can cause orthostasis although I do believe she has peripheral component more so than orthostatic hypotension. For now we will hold her Lasix 40 mg hold her losartan 50 mg and continue her metoprolol 75 mg twice a day for rate control for it admission. She has had of cardiac enzymes which are negative and I feel once again this is low you in terms of this being an acute coronary syndrome and did not feel the  need to rule this out any further. Her EKG is unchanged from prior  Although I believe this is low yield, given the fact that her INR is 1.5 on admission and, as there was posterior she has a posterior circulation stroke and we will rule this out by an MRI of the brain. I would not do any further workup and her tubes if her symptoms resolve with antiemetics, H2 blockers and repeat admission by physical therapy for vestibular training which I will also order.   2 atrial fibrillation, child score 2-patient will continue on Lopressor 75 mg twice daily. We will have pharmacy dose her Coumadin.  3 hypertension-get orthostatic blood pressures-old losartan 50 mg, hold Lasix for now. If needed would add low-dose hydralazine to medications. It seems her shoes 12 amlodipine with side effects in the past and has some subjective medications as this dizziness has occurred in the past as well   4 well compensated congestive heart failure-last echo 09/01/2010 (EF 45-50% inferior and apical hypokinesis with mild LVH) Will probably need a lower dose of Lasix once or starts been obtained  5 impaired glucose tolerance-evaluate in a.m. blood sugars. If above 140 would get an A1c   6 history of diverticulitis with colectomy + colonic polyps + hemorrhoids-continue Anusol suppository. On sure if a candidate for hemorrhoidectomy  7 history of osteopenia-outpatient reassessment    >60 min Observation to Dr. Susa Raring long team #5 FULL code  [facility] 05/26/2011, 9:13 PM

## 2011-05-26 NOTE — ED Notes (Signed)
MD at bedside. 

## 2011-05-27 ENCOUNTER — Inpatient Hospital Stay (HOSPITAL_COMMUNITY): Payer: Medicare HMO

## 2011-05-27 DIAGNOSIS — K5731 Diverticulosis of large intestine without perforation or abscess with bleeding: Secondary | ICD-10-CM

## 2011-05-27 DIAGNOSIS — R42 Dizziness and giddiness: Secondary | ICD-10-CM

## 2011-05-27 DIAGNOSIS — K766 Portal hypertension: Secondary | ICD-10-CM

## 2011-05-27 DIAGNOSIS — I4891 Unspecified atrial fibrillation: Secondary | ICD-10-CM

## 2011-05-27 LAB — BASIC METABOLIC PANEL
BUN: 13 mg/dL (ref 6–23)
Chloride: 104 mEq/L (ref 96–112)
GFR calc Af Amer: 61 mL/min — ABNORMAL LOW (ref >90)
Potassium: 3.4 mEq/L — ABNORMAL LOW (ref 3.5–5.1)

## 2011-05-27 LAB — CBC
HCT: 39.9 % (ref 36.0–46.0)
Hemoglobin: 13.1 g/dL (ref 12.0–15.0)
RBC: 4.12 MIL/uL (ref 3.87–5.11)
WBC: 5.4 10*3/uL (ref 4.0–10.5)

## 2011-05-27 LAB — PROTIME-INR: INR: 1.6 — ABNORMAL HIGH (ref 0.00–1.49)

## 2011-05-27 LAB — GLUCOSE, CAPILLARY
Glucose-Capillary: 161 mg/dL — ABNORMAL HIGH (ref 70–99)
Glucose-Capillary: 139 mg/dL — ABNORMAL HIGH (ref 70–99)

## 2011-05-27 MED ORDER — FUROSEMIDE 40 MG PO TABS
40.0000 mg | ORAL_TABLET | Freq: Every day | ORAL | Status: DC
  Administered 2011-05-27 – 2011-05-29 (×3): 40 mg via ORAL
  Filled 2011-05-27 (×3): qty 1

## 2011-05-27 MED ORDER — POTASSIUM CHLORIDE CRYS ER 20 MEQ PO TBCR
20.0000 meq | EXTENDED_RELEASE_TABLET | Freq: Two times a day (BID) | ORAL | Status: DC
  Administered 2011-05-27 (×2): 20 meq via ORAL
  Filled 2011-05-27 (×4): qty 1

## 2011-05-27 MED ORDER — HYDROCORTISONE ACETATE 25 MG RE SUPP
25.0000 mg | Freq: Two times a day (BID) | RECTAL | Status: DC
  Administered 2011-05-27 – 2011-05-29 (×4): 25 mg via RECTAL
  Filled 2011-05-27 (×7): qty 1

## 2011-05-27 MED ORDER — MECLIZINE HCL 25 MG PO TABS
25.0000 mg | ORAL_TABLET | Freq: Three times a day (TID) | ORAL | Status: DC
  Administered 2011-05-27 – 2011-05-29 (×7): 25 mg via ORAL
  Filled 2011-05-27 (×9): qty 1

## 2011-05-27 MED ORDER — WARFARIN - PHARMACIST DOSING INPATIENT
Freq: Every day | Status: DC
  Administered 2011-05-27: 18:00:00

## 2011-05-27 MED ORDER — METOPROLOL TARTRATE 50 MG PO TABS
75.0000 mg | ORAL_TABLET | Freq: Two times a day (BID) | ORAL | Status: DC
  Administered 2011-05-27 – 2011-05-29 (×6): 75 mg via ORAL
  Filled 2011-05-27 (×7): qty 1

## 2011-05-27 MED ORDER — PROMETHAZINE HCL 25 MG PO TABS
12.5000 mg | ORAL_TABLET | Freq: Four times a day (QID) | ORAL | Status: DC | PRN
  Administered 2011-05-27 – 2011-05-29 (×2): 12.5 mg via ORAL
  Filled 2011-05-27 (×2): qty 1

## 2011-05-27 MED ORDER — WARFARIN SODIUM 10 MG PO TABS
10.0000 mg | ORAL_TABLET | Freq: Once | ORAL | Status: AC
  Administered 2011-05-27: 10 mg via ORAL
  Filled 2011-05-27: qty 1

## 2011-05-27 MED ORDER — PROMETHAZINE HCL 25 MG/ML IJ SOLN
12.5000 mg | INTRAMUSCULAR | Status: DC | PRN
  Administered 2011-05-27: 01:00:00 via INTRAVENOUS
  Administered 2011-05-27: 12.5 mg via INTRAVENOUS
  Filled 2011-05-27 (×2): qty 1

## 2011-05-27 MED ORDER — HEPARIN SODIUM (PORCINE) 5000 UNIT/ML IJ SOLN
5000.0000 [IU] | Freq: Three times a day (TID) | INTRAMUSCULAR | Status: DC
  Administered 2011-05-27 (×2): 5000 [IU] via SUBCUTANEOUS
  Filled 2011-05-27 (×5): qty 1

## 2011-05-27 NOTE — Progress Notes (Signed)
Patient ID: [name], female   DOB: 10/13/1926, 78 y.o.   MRN: [id number]  Subjective: No events overnight. Patient denies chest pain, shortness of breath, abdominal pain.   Objective:  Vital signs in last 24 hours:  Filed Vitals:   05/27/11 0613 05/27/11 0616 05/27/11 0619 05/27/11 1001  BP: 134/82 144/88 132/86 113/74  Pulse: 74 74 74 84    Intake/Output from previous day:  No intake or output data in the 24 hours ending 05/27/11 1013  Physical Exam: General: Alert, awake, oriented x3, in no acute distress. HEENT: No bruits, no goiter. Moist mucous membranes, no scleral icterus, no conjunctival pallor. Heart: Regular rate and rhythm, S1/S2 +, no murmurs, rubs, gallops. Lungs: Clear to auscultation bilaterally. No wheezing, no rhonchi, no rales.  Abdomen: Soft, nontender, nondistended, positive bowel sounds. Extremities: No clubbing or cyanosis, no pitting edema,  positive pedal pulses. Neuro: Grossly nonfocal.  Lab Results:  Lab 05/27/11 0515 05/26/11 2000  WBC 5.4 5.8  HGB 13.1 14.3  HCT 39.9 42.8  PLT 131* 147*    Lab 05/27/11 0515 05/26/11 2000  NA 142 143  K 3.4* 3.9  CL 104 103  CO2 32 33*  GLUCOSE 93 107*  BUN 13 17  CREATININE 0.97 0.91  CALCIUM 8.8 9.1    Lab 05/27/11 0515 05/26/11 2000 05/23/11 0739  INR 1.60* 1.51* 4.5  PROTIME -- -- --   Cardiac markers:  Lab 05/26/11 2000  CKMB --  TROPONINI <0.30  MYOGLOBIN --    Studies/Results:  Ct Head Wo Contrast 05/26/2011     IMPRESSION:  Atrophy and chronic microvascular ischemia.  No acute abnormality.   Mr [name] Head Wo Contrast 05/27/2011  MRI HEAD   IMPRESSION:  Atrophy and chronic microvascular ischemia.  No acute infarct.    MRA HEAD    IMPRESSION:  Negative    Mr Brain Wo Contrast 05/27/2011    IMPRESSION:  Negative    Medications: Scheduled Meds:   . heparin  5,000 Units Subcutaneous Q8H  . hydrocortisone  25 mg Rectal BID  . meclizine  25 mg Oral Once  . meclizine   25 mg Oral TID  . metoprolol  75 mg Oral BID  . ondansetron  4 mg Intravenous Once  . ondansetron  4 mg Intravenous Once   Continuous Infusions:  PRN Meds:.promethazine, promethazine  Assessment/Plan:  Principal Problem:  *Vertigo - unclear etiology at this time - please note that MRI of the head is negative for acute stroke or other intracranial abnormalities - will continue Meclizine for now - PT evaluation  Active Problems:  Atrial fibrillation - rate controlled - continue metoprolol for now - continue to monitor vitals per floor protocol   Hypokalemia - will check Mg and supplement K as indicated   Chronic anticoagulation - continue home medication regimen   EDUCATION - test results and diagnostic studies were discussed with patient and pt's family who was present at the bedside - patient and family have verbalized the understanding - questions were answered at the bedside and contact information was provided for additional questions or concerns   LOS: 1 day   MAGICK-Landrum Carbonell 05/27/2011, 10:13 AM  [facility] Pager: [phone]

## 2011-05-27 NOTE — Progress Notes (Signed)
ANTICOAGULATION CONSULT NOTE - Initial Consult  Pharmacy Consult for Coumadin Indication: atrial fibrillation  Allergies  Allergen Reactions  . Bactrim   . Codeine   . Nitrofurantoin   . Nitrofurantoin Monohyd Macro   . Penicillins   . Sulfamethoxazole W-Trimethoprim     Patient Measurements: Height: 5' 6.5" (168.9 cm) Weight: 152 lb 5.4 oz (69.1 kg) IBW/kg (Calculated) : 60.45    Vital Signs: Temp: 98.1 F (36.7 C) (05/19 0612) Temp src: Oral (05/19 0612) BP: 113/74 mmHg (05/19 1001) Pulse Rate: 84  (05/19 1001)  Labs:  [facility] 05/27/11 0515 05/26/11 2000  HGB 13.1 14.3  HCT 39.9 42.8  PLT 131* 147*  APTT -- 57*  LABPROT 19.3* 18.5*  INR 1.60* 1.51*  HEPARINUNFRC -- --  CREATININE 0.97 0.91  CKTOTAL -- --  CKMB -- --  TROPONINI -- <0.30    Estimated Creatinine Clearance: 42.7 ml/min (by C-G formula based on Cr of 0.97).   Medical History: Past Medical History  Diagnosis Date  . B12 DEFICIENCY 11/25/2009  . ESSENTIAL HYPERTENSION 09/30/2009  . CHF 01/27/2009  . SMALL BOWEL OBSTRUCTION 05/05/2008  . DIVERTICULITIS OF COLON 01/27/2009  . OSTEOARTHRITIS, KNEE, RIGHT 02/08/2009  . OSTEOPENIA 01/27/2009  . Left ventricular dysfunction   . Hypertension   . History of echocardiogram 07/25/2009    EF 45 to 50% with mild to moderate MR and TR  . Hemorrhoids   . Rectal bleeding   . Atrial fib/flutter, transient     Medications:  Scheduled:    . furosemide  40 mg Oral Daily  . heparin  5,000 Units Subcutaneous Q8H  . hydrocortisone  25 mg Rectal BID  . meclizine  25 mg Oral Once  . meclizine  25 mg Oral TID  . metoprolol  75 mg Oral BID  . ondansetron  4 mg Intravenous Once  . ondansetron  4 mg Intravenous Once  . potassium chloride  20 mEq Oral BID   Infusions:   PRN: promethazine, promethazine  Assessment:  78 yo F with a hx of A.fib on chronic coumadin, admitted for vertigo.  MRI of head is negative for acute stroke or other intracranial  abnormalities.  Pharmacy to resume coumadin  Home dose = 7.5 mg daily  INR on admission subtherapeutic; last dose reported 5/17  CBC stable, no bleeding/complications reported  Goal of Therapy:  INR 2-3 Monitor platelets by anticoagulation protocol: Yes   Plan:  1.) coumadin 10 mg po x 1 tonight 2.) d/c sq heparin 3.) Daily PT/INR  [name], Loma Messing PharmD 10:40 AM 05/27/2011

## 2011-05-28 DIAGNOSIS — K5731 Diverticulosis of large intestine without perforation or abscess with bleeding: Secondary | ICD-10-CM

## 2011-05-28 DIAGNOSIS — K766 Portal hypertension: Secondary | ICD-10-CM

## 2011-05-28 DIAGNOSIS — R42 Dizziness and giddiness: Secondary | ICD-10-CM

## 2011-05-28 DIAGNOSIS — I4891 Unspecified atrial fibrillation: Secondary | ICD-10-CM

## 2011-05-28 LAB — PROTIME-INR
INR: 1.41 (ref 0.00–1.49)
Prothrombin Time: 17.5 seconds — ABNORMAL HIGH (ref 11.6–15.2)

## 2011-05-28 MED ORDER — POTASSIUM CHLORIDE CRYS ER 20 MEQ PO TBCR
40.0000 meq | EXTENDED_RELEASE_TABLET | Freq: Once | ORAL | Status: AC
  Administered 2011-05-28: 40 meq via ORAL
  Filled 2011-05-28: qty 2

## 2011-05-28 MED ORDER — WARFARIN SODIUM 10 MG PO TABS
10.0000 mg | ORAL_TABLET | Freq: Once | ORAL | Status: AC
  Administered 2011-05-28: 10 mg via ORAL
  Filled 2011-05-28: qty 1

## 2011-05-28 MED ORDER — POTASSIUM CHLORIDE CRYS ER 20 MEQ PO TBCR
20.0000 meq | EXTENDED_RELEASE_TABLET | Freq: Two times a day (BID) | ORAL | Status: DC
  Administered 2011-05-28 – 2011-05-29 (×3): 20 meq via ORAL
  Filled 2011-05-28 (×4): qty 1

## 2011-05-28 NOTE — Evaluation (Signed)
Physical Therapy Evaluation/Vestibular Patient Details Name: [name] MRN: [id number] DOB: Feb 09, 1926 Today's Date: 05/28/2011 Time: 0920-1005 PT Time Calculation (min): 45 min  PT Assessment / Plan / Recommendation Clinical Impression  Pt admitted for vertigo.  Pt describes dizziness with position changes and head movement.  Pt reports no symptoms/ no nystagmus with R modified dix hallpike however nystagmus with L modified dix hallpike.  Performed CRT manuver for possible L posterior canal cupulolithiasis however pt became extremely dizzy (10/10) and grabbed therapists arms when laying supine during roll to right side and unable to keep eyes open.  More nystagmus present when finishing CRT and pt positioned right ear down.  Will need to f/u with pt tomorrow prior to d/c to attempt CRT again and possibly assess horizontal canal.  Pt given referral sheet for PCP to start process for vestibular outpatient.  Pt also provided with semont manuver handout and explained to pt however will likely need to perform to ensure pt understanding.    PT Assessment  Patient needs continued PT services    Follow Up Recommendations  Outpatient PT;Other (comment) (vestibular OP)    Barriers to Discharge Decreased caregiver support      lEquipment Recommendations  Rolling walker with 5" wheels;Other (comment) (pt declining however)    Recommendations for Other Services     Frequency Min 3X/week    Precautions / Restrictions Precautions Precautions: Fall   Pertinent Vitals/Pain     Vestibular assessment: Pt reports dizziness started when going to lay down on Right side and reports she went downstairs to get something to drink and dizziness persisted.  Pt also reporting nausea and perspiration present.  She states that dizziness is induced by position changes and head movement and occurs when lying down.  Performed oculomotor exam and WNL except dizziness reported with left head thrust.  Pt with  nystagmus present with L modified Gilberto Better.  CRT performed and pt with increased dizziness during roll so maneuver may need to be repeated.  Pt reported increased dizziness also during supine phase of CRT so ? Horizontal involvement.  Will continue to assess pt tomorrow.   Mobility  Transfers Transfers: Sit to Stand;Stand to Sit Sit to Stand: 4: Min guard Stand to Sit: 4: Min guard Ambulation/Gait Ambulation/Gait Assistance: 4: Min assist Ambulation Distance (Feet): 400 Feet Assistive device: 1 person hand held assist Ambulation/Gait Assistance Details: assist to steady with dizziness, educated to stare at single unmoving object with gait to decrease dizziness Gait Pattern: Step-through pattern;Decreased stride length    Exercises     PT Diagnosis: Difficulty walking;Other (comment) (BPPV)  PT Problem List: Decreased activity tolerance;Other (comment) (dizziness with position changes and head movement) PT Treatment Interventions: DME instruction;Gait training;Functional mobility training;Therapeutic activities;Therapeutic exercise;Other (comment) (canalith repositioning, semont, brandt-daroff exercises)   PT Goals Acute Rehab PT Goals PT Goal Formulation: With patient Time For Goal Achievement: 06/04/11 Potential to Achieve Goals: Good Pt will Ambulate: >150 feet;with modified independence;with least restrictive assistive device PT Goal: Ambulate - Progress: Goal set today Additional Goals Additional Goal #1: Pt will report 2/10 dizziness with any mobility. PT Goal: Additional Goal #1 - Progress: Goal set today Additional Goal #2: Pt will appropriately demonstrate semont maneuver to decrease dizziness with position changes and head movememnt to 2/10. PT Goal: Additional Goal #2 - Progress: Goal set today  Visit Information  Last PT Received On: 05/28/11 Assistance Needed: +1    Subjective Data  Subjective: "I'm usually very active."   Prior Functioning  Home  Living Lives With: Alone Type of Home: House Home Layout: Two level Alternate Level Stairs-Number of Steps: flight Alternate Level Stairs-Rails: Right Home Adaptive Equipment: None Prior Function Level of Independence: Independent Driving: Yes Communication Communication: No difficulties    Cognition  Overall Cognitive Status: Appears within functional limits for tasks assessed/performed Arousal/Alertness: Awake/alert Orientation Level: Appears intact for tasks assessed Behavior During Session: [facility] for tasks performed    Extremity/Trunk Assessment Right Upper Extremity Assessment RUE ROM/Strength/Tone: Within functional levels Left Upper Extremity Assessment LUE ROM/Strength/Tone: Within functional levels Right Lower Extremity Assessment RLE ROM/Strength/Tone: Within functional levels RLE Sensation: Deficits RLE Sensation Deficits:  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

## 2011-05-28 NOTE — Progress Notes (Signed)
ANTICOAGULATION CONSULT NOTE - Follow Up  Pharmacy Consult for Coumadin Indication: atrial fibrillation  Allergies  Allergen Reactions  . Bactrim   . Codeine   . Nitrofurantoin   . Nitrofurantoin Monohyd Macro   . Penicillins   . Sulfamethoxazole W-Trimethoprim     Patient Measurements: Height: 5' 6.5" (168.9 cm) Weight: 153 lb (69.4 kg) IBW/kg (Calculated) : 60.45    Vital Signs: Temp: 97.4 F (36.3 C) (05/20 0405) Temp src: Oral (05/20 0405) BP: 135/87 mmHg (05/20 0405) Pulse Rate: 60  (05/20 0405)  Labs:  Basename 05/28/11 0511 05/27/11 0515 05/26/11 2000  HGB -- 13.1 14.3  HCT -- 39.9 42.8  PLT -- 131* 147*  APTT -- -- 57*  LABPROT 17.5* 19.3* 18.5*  INR 1.41 1.60* 1.51*  HEPARINUNFRC -- -- --  CREATININE -- 0.97 0.91  CKTOTAL -- -- --  CKMB -- -- --  TROPONINI -- -- <0.30    Estimated Creatinine Clearance: 42.7 ml/min (by C-G formula based on Cr of 0.97).   Medical History: Past Medical History  Diagnosis Date  . B12 DEFICIENCY 11/25/2009  . ESSENTIAL HYPERTENSION 09/30/2009  . CHF 01/27/2009  . SMALL BOWEL OBSTRUCTION 05/05/2008  . DIVERTICULITIS OF COLON 01/27/2009  . OSTEOARTHRITIS, KNEE, RIGHT 02/08/2009  . OSTEOPENIA 01/27/2009  . Left ventricular dysfunction   . Hypertension   . History of echocardiogram 07/25/2009    EF 45 to 50% with mild to moderate MR and TR  . Hemorrhoids   . Rectal bleeding   . Atrial fib/flutter, transient     Medications:  Scheduled:     . furosemide  40 mg Oral Daily  . hydrocortisone  25 mg Rectal BID  . meclizine  25 mg Oral TID  . metoprolol  75 mg Oral BID  . potassium chloride  20 mEq Oral BID  . warfarin  10 mg Oral ONCE-1800  . Warfarin - Pharmacist Dosing Inpatient   Does not apply q1800  . DISCONTD: heparin  5,000 Units Subcutaneous Q8H   Infusions:   PRN: promethazine, promethazine  Assessment:  78 yo F with a hx of A.fib on chronic coumadin, admitted for vertigo.  MRI of head is negative for  acute stroke or other intracranial abnormalities.  Pharmacy to resume coumadin  Home dose = 7.5 mg daily  INR on admission subtherapeutic; dose boosted to 10mg  5/19  INR has fallen today.  CBC stable, no bleeding/complications reported  Goal of Therapy:  INR 2-3 Monitor platelets by anticoagulation protocol: Yes   Plan:  1.) Repeat coumadin 10 mg po x 1 tonight 2.) Daily PT/INR  [name], PharmD, BCPS Pager: [phone] 05/28/2011

## 2011-05-28 NOTE — Progress Notes (Addendum)
Patient ID: [name], female   DOB: 06-Jul-1926, 78 y.o.   MRN: [id number]   Subjective: No events overnight. Patient denies chest pain, shortness of breath, abdominal pain. Had bowel movement and reports ambulating. Pt reports resolution of her symptoms but still experience intermittent episodes of dizziness.   Objective:  Vital signs in last 24 hours:  Filed Vitals:   05/27/11 2202 05/28/11 0405 05/28/11 0500 05/28/11 1413  BP: 145/70 135/87  110/64  Pulse: 68 60  60  Temp: 97.8 F (36.6 C) 97.4 F (36.3 C)  98.3 F (36.8 C)  TempSrc: Oral Oral  Oral  Resp: 20 20  20   Height:      Weight:   69.4 kg (153 lb)   SpO2: 92% 95%  97%    Intake/Output from previous day:   Intake/Output Summary (Last 24 hours) at 05/28/11 1433 Last data filed at 05/28/11 1321  Gross per 24 hour  Intake   1440 ml  Output    450 ml  Net    990 ml    Physical Exam: General: Alert, awake, oriented x3, in no acute distress. HEENT: No bruits, no goiter. Moist mucous membranes, no scleral icterus, no conjunctival pallor. Heart: Regular rate and rhythm, S1/S2 +, no murmurs, rubs, gallops. Lungs: Clear to auscultation bilaterally. No wheezing, no rhonchi, no rales.  Abdomen: Soft, nontender, nondistended, positive bowel sounds. Extremities: No clubbing or cyanosis, no pitting edema,  positive pedal pulses. Neuro: Grossly nonfocal.  Lab Results:  Lab 05/27/11 0515 05/26/11 2000  WBC 5.4 5.8  HGB 13.1 14.3  HCT 39.9 42.8  PLT 131* 147*    Lab 05/27/11 0515 05/26/11 2000  NA 142 143  K 3.4* 3.9  CL 104 103  CO2 32 33*  GLUCOSE 93 107*  BUN 13 17  CREATININE 0.97 0.91  CALCIUM 8.8 9.1  MG -- --    Lab 05/28/11 0511 05/27/11 0515 05/26/11 2000 05/23/11 0739  INR 1.41 1.60* 1.51* 4.5  PROTIME -- -- -- --   Cardiac markers:  Lab 05/26/11 2000  CKMB --  TROPONINI <0.30  MYOGLOBIN --    Studies/Results: Ct Head Wo Contrast 05/26/2011    IMPRESSION:  Atrophy and chronic  microvascular ischemia.  No acute abnormality.    Mr [name] Head Wo Contrast 05/27/2011   IMPRESSION:  Negative    Mr Brain Wo Contrast 05/27/2011   MPRESSION:  Atrophy and chronic microvascular ischemia.  No acute infarct.  MRA HEAD   IMPRESSION: Negative    Medications: Scheduled Meds:   . furosemide  40 mg Oral Daily  . hydrocortisone  25 mg Rectal BID  . meclizine  25 mg Oral TID  . metoprolol  75 mg Oral BID  . potassium chloride  20 mEq Oral BID  . potassium chloride  40 mEq Oral Once  . warfarin  10 mg Oral ONCE-1800  . warfarin  10 mg Oral ONCE-1800   Continuous Infusions:  PRN Meds:.promethazine, promethazine  Assessment/Plan:  Principal Problem:  *Vertigo  - unclear etiology at this time, pt clinically improving - please note that MRI of the head is negative for acute stroke or other intracranial abnormalities  - will continue Meclizine for now  - PT evaluation recommends home PT  Active Problems:  Atrial fibrillation  - rate controlled  - continue metoprolol for now  - continue to monitor vitals per floor protocol  - continue Coumadin per pharmacy  Hypokalemia  - will check Mg and supplement K as  indicated  - BMP in AM  Chronic anticoagulation  - continue home medication regimen  - Coumadin per pharmacy  EDUCATION  - test results and diagnostic studies were discussed with patient  - patient verbalized the understanding  - questions were answered at the bedside and contact information was provided for additional questions or concerns - d/c in AM   LOS: 2 days   MAGICK-Kaedon Fanelli 05/28/2011, 2:33 PM  [facility] Pager: [phone]

## 2011-05-29 DIAGNOSIS — K766 Portal hypertension: Secondary | ICD-10-CM

## 2011-05-29 DIAGNOSIS — R42 Dizziness and giddiness: Secondary | ICD-10-CM

## 2011-05-29 DIAGNOSIS — K5731 Diverticulosis of large intestine without perforation or abscess with bleeding: Secondary | ICD-10-CM

## 2011-05-29 DIAGNOSIS — I4891 Unspecified atrial fibrillation: Secondary | ICD-10-CM

## 2011-05-29 LAB — CBC
HCT: 42.3 % (ref 36.0–46.0)
Hemoglobin: 14.1 g/dL (ref 12.0–15.0)
MCH: 32.6 pg (ref 26.0–34.0)
MCV: 97.7 fL (ref 78.0–100.0)
Platelets: 126 10*3/uL — ABNORMAL LOW (ref 150–400)
RBC: 4.33 MIL/uL (ref 3.87–5.11)
WBC: 5 10*3/uL (ref 4.0–10.5)

## 2011-05-29 LAB — BASIC METABOLIC PANEL
CO2: 27 mEq/L (ref 19–32)
Calcium: 9.3 mg/dL (ref 8.4–10.5)
Chloride: 102 mEq/L (ref 96–112)
Creatinine, Ser: 0.98 mg/dL (ref 0.50–1.10)
Glucose, Bld: 88 mg/dL (ref 70–99)

## 2011-05-29 LAB — GLUCOSE, CAPILLARY: Glucose-Capillary: 93 mg/dL (ref 70–99)

## 2011-05-29 MED ORDER — UNABLE TO FIND: Status: DC

## 2011-05-29 MED ORDER — FLUTICASONE PROPIONATE 50 MCG/ACT NA SUSP
2.0000 | Freq: Every day | NASAL | Status: DC
  Administered 2011-05-29: 2 via NASAL
  Filled 2011-05-29: qty 16

## 2011-05-29 MED ORDER — PROMETHAZINE HCL 12.5 MG PO TABS: 12.5000 mg | ORAL_TABLET | Freq: Four times a day (QID) | ORAL | Status: DC | PRN

## 2011-05-29 MED ORDER — LORATADINE 10 MG PO TABS
10.0000 mg | ORAL_TABLET | Freq: Every day | ORAL | Status: DC
  Administered 2011-05-29: 10 mg via ORAL
  Filled 2011-05-29: qty 1

## 2011-05-29 MED ORDER — WARFARIN SODIUM 5 MG PO TABS
5.0000 mg | ORAL_TABLET | Freq: Once | ORAL | Status: DC
  Filled 2011-05-29: qty 1

## 2011-05-29 MED ORDER — FLUTICASONE PROPIONATE 50 MCG/ACT NA SUSP: 2.0000 | Freq: Every day | NASAL | Status: DC

## 2011-05-29 MED ORDER — MECLIZINE HCL 25 MG PO TABS: 25.0000 mg | ORAL_TABLET | Freq: Three times a day (TID) | ORAL | Status: DC

## 2011-05-29 NOTE — Patient Instructions (Signed)
Pt reports allergies which are making her head and ears feel full.  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 BBQ roll performed as treatment.  Pt with no c/o dizziness during ambulation after treatment.

## 2011-05-29 NOTE — Discharge Summary (Signed)
Physician Discharge Summary  Patient ID: [name] MRN: [id number] DOB/AGE: 76-Nov-1928 76 y.o.  Admit date: 05/26/2011 Discharge date: 05/29/2011  Primary Care Physician:  [name], MD, MD  Discharge Diagnoses:   Vertigo: Improving Atrial fibrillation, on chronic anticoagulation Hypokalemia replaced Hypertension stable  Consults:  None    Discharge Medications: Medication List  As of 05/29/2011 11:35 AM   TAKE these medications         fluticasone 50 MCG/ACT nasal spray   Commonly known as: FLONASE   Place 2 sprays into the nose daily.      furosemide 40 MG tablet   Commonly known as: LASIX   Take 40 mg by mouth daily.      hydrocortisone 2.5 % rectal cream   Commonly known as: ANUSOL-HC   Place rectally 2 (two) times daily.      hydrocortisone 25 MG suppository   Commonly known as: ANUSOL-HC   INSERT ONE SUPPOSITORY RECTALLY TWICE DAILY      ICAPS PO   Take by mouth.      loratadine 10 MG tablet   Commonly known as: CLARITIN   Take 10 mg by mouth daily.      losartan 50 MG tablet   Commonly known as: COZAAR   Take 50 mg by mouth daily.      meclizine 25 MG tablet   Commonly known as: ANTIVERT   Take 1 tablet (25 mg total) by mouth 3 (three) times daily. Available over-the-counter      metoprolol 50 MG tablet   Commonly known as: LOPRESSOR   Take 75 mg by mouth 2 (two) times daily.      multivitamin tablet   Take 1 tablet by mouth daily.      potassium chloride 10 MEQ tablet   Commonly known as: K-DUR,KLOR-CON   Take 10 mEq by mouth 2 (two) times daily.      promethazine 12.5 MG tablet   Commonly known as: PHENERGAN   Take 1 tablet (12.5 mg total) by mouth every 6 (six) hours as needed for nausea. For nausea      UNABLE TO FIND   OUT PATIENT PHYSICAL THERAPY for vestibular rehab    Diagnosis: vertigo      warfarin 5 MG tablet   Commonly known as: COUMADIN   Take as directed by Anticoagulation clinic.  Pt takes up to 1 1/2  tablets daily.             Brief H and P: For complete details please refer to admission H and P, but in brief the patient is 78 year old female who presented with dizziness. Per H&P she had taken her grandchildren to eat out a day before the admission and felt room spinning around her when she lay her head on the pillow. Patient had transient headache with nausea. Patient also reported that the dizziness was worse when she lay on her right side or when she bended her head forward. Patient was admitted for further workup.   Hospital Course:    *Vertigo: Patient was admitted to the telemetry floor, MRI of the head was obtained which was negative for any acute stroke or other intracranial abnormalities. Patient was placed on meclizine scheduled with Flonase and Claritin. Physical therapy evaluation was done and recommended possible rehabilitation for vertigo outpatient. Patient was recommended to continue scheduled meclizine until her followup with her PCP, Dr. Caryl Never on 06/06/2011.   EXTERNAL HEMORRHOIDS WITHOUT MENTION COMP: Patient was continued on anusol suppositories  Atrial fibrillation on Chronic anticoagulation: Patient was continued on her home medications in Coumadin per pharmacy while inpatient.    Day of Discharge BP 132/86  Pulse 60  Temp(Src) 98.1 F (36.7 C) (Oral)  Resp 20  Ht 5' 6.5" (1.689 m)  Wt 69.4 kg (153 lb)  BMI 24.32 kg/m2  SpO2 98%  Physical Exam: General: Alert and awake oriented x3 not in any acute distress. HEENT: anicteric sclera, pupils reactive to light and accommodation CVS: S1-S2 clear no murmur rubs or gallops Chest: clear to auscultation bilaterally, no wheezing rales or rhonchi Abdomen: soft nontender, nondistended, normal bowel sounds, no organomegaly Extremities: no cyanosis, clubbing or edema noted bilaterally Neuro: Cranial nerves II-XII intact, no focal neurological deficits   The results of significant diagnostics from this  hospitalization (including imaging, microbiology, ancillary and laboratory) are listed below for reference.    LAB RESULTS: Basic Metabolic Panel:  Lab 05/29/11 7829 05/27/11 0515  NA 137 142  K 4.7 3.4*  CL 102 104  CO2 27 32  GLUCOSE 88 93  BUN 21 13  CREATININE 0.98 0.97  CALCIUM 9.3 8.8  MG -- --  PHOS -- --   Liver Function Tests:  Lab 05/26/11 2000  AST 30  ALT 34  ALKPHOS 86  BILITOT 1.2  PROT 6.9  ALBUMIN 3.9   CBC:  Lab 05/29/11 0520 05/27/11 0515 05/26/11 2000  WBC 5.0 5.4 --  NEUTROABS -- -- 2.8  HGB 14.1 13.1 --  HCT 42.3 39.9 --  MCV 97.7 -- --  PLT 126* 131* --   Cardiac Enzymes:  Lab 05/26/11 2000  CKTOTAL --  CKMB --  CKMBINDEX --  TROPONINI <0.30   BNP: No components found with this basename: POCBNP:2 CBG:  Lab 05/29/11 0711 05/28/11 0722  GLUCAP 93 83    Significant Diagnostic Studies:  Ct Head Wo Contrast  05/26/2011  *RADIOLOGY REPORT*  Clinical Data: Dizziness  CT HEAD WITHOUT CONTRAST  Technique:  Contiguous axial images were obtained from the base of the skull through the vertex without contrast.  Comparison: None.  Findings: Generalized atrophy.  Chronic microvascular ischemic changes in the white matter.  No acute infarct.  Negative for hemorrhage or mass.  Calvarium is intact.  IMPRESSION: Atrophy and chronic microvascular ischemia.  No acute abnormality.  Original Report Authenticated By: Camelia Phenes, M.D.   Mr [name] Head Wo Contrast  05/27/2011  *RADIOLOGY REPORT*  Clinical Data:  Rule out stroke  MRI HEAD WITHOUT CONTRAST MRA HEAD WITHOUT CONTRAST  Technique:  Multiplanar, multiecho pulse sequences of the brain and surrounding structures were obtained without intravenous contrast. Angiographic images of the head were obtained using MRA technique without contrast.  Comparison:  CT 05/26/2011  MRI HEAD  Findings:  Negative for acute infarct.  Image quality degraded by motion.  Generalized atrophy.  Chronic ischemic changes in the  periventricular white matter bilaterally. Basal ganglia and brainstem are intact.  Negative for mass or edema.  No hemorrhage or fluid collection is identified.  Paranasal sinuses are clear.  IMPRESSION: Atrophy and chronic microvascular ischemia.  No acute infarct.  MRA HEAD  Findings: Both vertebral arteries are patent to the basilar. Basilar, superior cerebellar, and posterior cerebral arteries are patent bilaterally.  Internal carotid artery is patent bilaterally without significant stenosis.  Anterior and middle cerebral arteries are patent bilaterally without stenosis.  Negative for cerebral aneurysm.  IMPRESSION: Negative  Original Report Authenticated By: Camelia Phenes, M.D.   Mr Brain Wo Contrast  05/27/2011  *RADIOLOGY REPORT*  Clinical Data:  Rule out stroke  MRI HEAD WITHOUT CONTRAST MRA HEAD WITHOUT CONTRAST  Technique:  Multiplanar, multiecho pulse sequences of the brain and surrounding structures were obtained without intravenous contrast. Angiographic images of the head were obtained using MRA technique without contrast.  Comparison:  CT 05/26/2011  MRI HEAD  Findings:  Negative for acute infarct.  Image quality degraded by motion.  Generalized atrophy.  Chronic ischemic changes in the periventricular white matter bilaterally. Basal ganglia and brainstem are intact.  Negative for mass or edema.  No hemorrhage or fluid collection is identified.  Paranasal sinuses are clear.  IMPRESSION: Atrophy and chronic microvascular ischemia.  No acute infarct.  MRA HEAD  Findings: Both vertebral arteries are patent to the basilar. Basilar, superior cerebellar, and posterior cerebral arteries are patent bilaterally.  Internal carotid artery is patent bilaterally without significant stenosis.  Anterior and middle cerebral arteries are patent bilaterally without stenosis.  Negative for cerebral aneurysm.  IMPRESSION: Negative  Original Report Authenticated By: Camelia Phenes, M.D.     Disposition and  Follow-up: Discharge Orders    Future Appointments: Provider: Department: Dept Phone: Center:   06/06/2011 8:45 AM [name], MD [facility] [phone] [facility]   06/08/2011 8:15 AM [facility] [phone] None   06/08/2011 8:30 AM Vesta Mixer, MD [facility] [phone] None     Future Orders Please Complete By Expires   Diet - low sodium heart healthy      Increase activity slowly          DISPOSITION:  Home  DIET:  heart healthy   ACTIVITY:  AS TOLERATED  DISCHARGE FOLLOW-UP Follow-up Information    Follow up with [name], MD on 06/06/2011. (at 8:30AM)    Contact information:   [address] [name] [address] [phone]          Time spent on Discharge:  45 minutes  Signed:  Italia Wolfert M.D. [facility] 05/29/2011, 11:35 AM

## 2011-05-29 NOTE — Progress Notes (Signed)
ANTICOAGULATION CONSULT NOTE - Follow Up  Pharmacy Consult for Coumadin Indication: atrial fibrillation  Allergies  Allergen Reactions  . Bactrim   . Codeine   . Nitrofurantoin   . Nitrofurantoin Monohyd Macro   . Penicillins   . Sulfamethoxazole W-Trimethoprim     Patient Measurements: Height: 5' 6.5" (168.9 cm) Weight: 153 lb (69.4 kg) IBW/kg (Calculated) : 60.45    Vital Signs: Temp: 98.1 F (36.7 C) (05/21 0505) Temp src: Oral (05/21 0505) BP: 132/86 mmHg (05/21 0505) Pulse Rate: 60  (05/21 0505)  Labs:  [name] 05/29/11 0520 05/28/11 0511 05/27/11 0515 05/26/11 2000  HGB 14.1 -- 13.1 --  HCT 42.3 -- 39.9 42.8  PLT 126* -- 131* 147*  APTT -- -- -- 57*  LABPROT 23.5* 17.5* 19.3* --  INR 2.05* 1.41 1.60* --  HEPARINUNFRC -- -- -- --  CREATININE 0.98 -- 0.97 0.91  CKTOTAL -- -- -- --  CKMB -- -- -- --  TROPONINI -- -- -- <0.30    Estimated Creatinine Clearance: 42.3 ml/min (by C-G formula based on Cr of 0.98).   Medical History: Past Medical History  Diagnosis Date  . B12 DEFICIENCY 11/25/2009  . ESSENTIAL HYPERTENSION 09/30/2009  . CHF 01/27/2009  . SMALL BOWEL OBSTRUCTION 05/05/2008  . DIVERTICULITIS OF COLON 01/27/2009  . OSTEOARTHRITIS, KNEE, RIGHT 02/08/2009  . OSTEOPENIA 01/27/2009  . Left ventricular dysfunction   . Hypertension   . History of echocardiogram 07/25/2009    EF 45 to 50% with mild to moderate MR and TR  . Hemorrhoids   . Rectal bleeding   . Atrial fib/flutter, transient     Medications:  Scheduled:     . fluticasone  2 spray Each Nare Daily  . furosemide  40 mg Oral Daily  . hydrocortisone  25 mg Rectal BID  . loratadine  10 mg Oral Daily  . meclizine  25 mg Oral TID  . metoprolol  75 mg Oral BID  . potassium chloride  20 mEq Oral BID  . warfarin  10 mg Oral ONCE-1800  . Warfarin - Pharmacist Dosing Inpatient   Does not apply q1800   Infusions:   PRN: promethazine, promethazine  Assessment:  78 yo F with a hx of A.fib  on chronic coumadin, admitted for vertigo.  MRI of head is negative for acute stroke or other intracranial abnormalities.  Pharmacy to resume coumadin  Home dose = 7.5 mg daily.  INR on admission subtherapeutic; dose boosted to 10mg  5/19-5/20.  INR has responded significantly into therapeutic range, may continue to rise with recent 10mg  doses, therefore will back off dose to 5mg  today.  CBC stable, no bleeding/complications reported.  Goal of Therapy:  INR 2-3 Monitor platelets by anticoagulation protocol: Yes   Plan:  1.)  Coumadin 5mg  po x 1 tonight 2.)  Daily PT/INR  [name], PharmD, BCPS Pager: [phone] 05/29/2011 10:49 AM

## 2011-05-29 NOTE — Progress Notes (Signed)
Physical Therapy Treatment Patient Details Name: [name] MRN: [id number] DOB: 1926-05-07 Today's Date: 05/29/2011 Time: 4098-1191 PT Time Calculation (min): 46 min  PT Assessment / Plan / Recommendation Comments on Treatment Session  PT and OT cotx to reassess horizontal canal with positive finding on right side.  Assisted OT with BBQ roll manuever.  Pt educated on BPPV and given handout.  Pt reports 0/10 dizziness with ambulation after performing manuever.  Continue to recommend f/u PT with Outpatient Vestibular.   Pt reports son will be assisting her home.    Follow Up Recommendations  Outpatient PT;Other (comment) (vestibular outpatient)    Barriers to Discharge        Equipment Recommendations  Rolling walker with 5" wheels;Other (comment) (pt declines RW)    Recommendations for Other Services    Frequency     Plan Discharge plan remains appropriate;Frequency remains appropriate    Precautions / Restrictions Precautions Precautions: Fall Restrictions Weight Bearing Restrictions: No   Pertinent Vitals/Pain Only c/o nausea during session, no pain    Mobility  Transfers Transfers: Sit to Stand;Stand to Sit Sit to Stand: 5: Supervision Stand to Sit: 5: Supervision Ambulation/Gait Ambulation/Gait Assistance: 4: Min guard Ambulation Distance (Feet): 400 Feet Assistive device: 1 person hand held assist Ambulation/Gait Assistance Details: pt requested 1 HHA and declined RW as she reports she will furniture/wall walk at home if necessary, no unsteadiness present, pt reports 0/10 dizziness    Exercises Other Exercises Other Exercises:  (Performed BBQ maneuver and instructed pt in forced prolonged)   PT Diagnosis:    PT Problem List:   PT Treatment Interventions:     PT Goals Acute Rehab PT Goals PT Goal: Ambulate - Progress: Progressing toward goal Additional Goals PT Goal: Additional Goal #1 - Progress: Partly met Additional Goal #2: Pt will appropriately  demonstrate vestibular exercises provided to decrease dizziness with position changes and head movement to 0/10. PT Goal: Additional Goal #2 - Progress: Other (comment) (updated due to horizontal canal involvement)  Visit Information  Last PT Received On: 05/29/11 Assistance Needed: +1    Subjective Data  Subjective: "I'm glad we've figured out what's going on."  (with her dizziness)   Cognition  Overall Cognitive Status: Appears within functional limits for tasks assessed/performed Orientation Level: Oriented X4 / Intact Behavior During Session: [facility] for tasks performed    Balance  Pt reports allergies which are making her head and ears feel full.  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 BBQ roll performed as treatment.  Pt with no c/o dizziness during ambulation after treatment.       End of Session PT - End of Session Patient left: with call bell/phone within reach;in chair    Shandra Szymborski,KATHrine E 05/29/2011, 11:52 AM

## 2011-05-29 NOTE — Evaluation (Signed)
Occupational Therapy Evaluation Patient Details Name: [name] MRN: [id number] DOB: [birth year] Today's Date: 05/29/2011 Time: 4098-1191 and 9:30-957  Cotx'd part of first session with PT OT Time Calculation (min): 60 min  OT Assessment / Plan / Recommendation Clinical Impression  This 78 year old female was admitted with dizziness.  She has a h/o macular degeneration, cataracts, cardiac issues and currently has allergies which are making her head and ears feel full.  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  BBQ roll tx performed.  She was asymptomatic when dressing herself.  Recommend that she follow up with OP vestibular program at our neurorehab center with PT.  Safety and modifications were discussed for home.    OT Assessment   (OP vestibular rehab with PT)    Follow Up Recommendations   (Outpatient PT at vestibular rehab)    Barriers to Discharge      Equipment Recommendations  Rolling walker with 5" wheels;Other (comment) (Pt declines bathroom DME)    Recommendations for Other Services    Frequency       Precautions / Restrictions Precautions Precautions: Fall Restrictions Weight Bearing Restrictions: No   Pertinent Vitals/Pain Had nausea with maneuvers.  No pain    ADL  Transfers/Ambulation Related to ADLs: min guard walking around room.  No LOB.  Pt furniture holds.  Doesn't want AD ADL Comments: Performed dressing.  Pt furniture holds to gather supplies.  No LOB or c/o dizziness after tx for vertigo.  Showed sock aid has pt had difficulty with right sock, but she was able to get this with extra time.  Discussed safety of not getting in shower if any dizziness present and to make sure someone is in the home with her when she does this.      OT Diagnosis:    OT Problem List:   OT Treatment Interventions:     OT Goals    Visit Information  Last OT Received On: 05/29/11    Subjective Data  Subjective: "Will my nausea go away" Patient Stated Goal: Get better. Get back to being active   Prior Functioning  Home Living Bathroom Shower/Tub: Tub/shower unit;Other (comment) (grab bar) Bathroom Toilet: Standard Additional Comments: doesn't want DME.  Discussed not getting in shower eventually and having someone in home when she does Prior Function Driving:  (has been taking scat to appointments) Communication Communication: No difficulties    Cognition  Overall Cognitive Status: Appears within functional limits for tasks assessed/performed Orientation Level: Oriented X4 / Intact Behavior During Session: [facility] for tasks performed    Extremity/Trunk Assessment Right Upper Extremity Assessment RUE ROM/Strength/Tone: Within functional levels Left Upper Extremity Assessment LUE ROM/Strength/Tone: Within functional levels   Mobility Transfers Sit to Stand: 5: Supervision   Exercise Other Exercises Other Exercises:  (Performed BBQ maneuver and instructed pt in forced prolonged)  Balance    End of Session OT - End of Session Activity Tolerance: Patient tolerated treatment well   [name] 05/29/2011, 10:04 AM Marica Otter, OTR/L [phone] 05/29/2011

## 2011-06-01 ENCOUNTER — Other Ambulatory Visit: Payer: Self-pay | Admitting: Family Medicine

## 2011-06-05 ENCOUNTER — Other Ambulatory Visit: Payer: Self-pay | Admitting: Family Medicine

## 2011-06-05 NOTE — Telephone Encounter (Signed)
Rx refill sent to pharmacy. 

## 2011-06-06 ENCOUNTER — Ambulatory Visit (INDEPENDENT_AMBULATORY_CARE_PROVIDER_SITE_OTHER): Payer: Medicare HMO | Admitting: Family Medicine

## 2011-06-06 ENCOUNTER — Encounter: Payer: Self-pay | Admitting: Family Medicine

## 2011-06-06 VITALS — BP 138/86 | HR 80 | Temp 98.6°F | Resp 16 | Wt 153.0 lb

## 2011-06-06 DIAGNOSIS — R42 Dizziness and giddiness: Secondary | ICD-10-CM

## 2011-06-06 DIAGNOSIS — I4891 Unspecified atrial fibrillation: Secondary | ICD-10-CM

## 2011-06-06 MED ORDER — HYDROCORTISONE 2.5 % RE CREA: TOPICAL_CREAM | Freq: Two times a day (BID) | RECTAL | Status: DC

## 2011-06-06 NOTE — Patient Instructions (Addendum)
Be sure to follow up in coumadin clinic in 2 days as scheduled.  Benign Positional Vertigo Vertigo means you feel like you or your surroundings are moving when they are not. Benign positional vertigo is the most common form of vertigo. Benign means that the cause of your condition is not serious. Benign positional vertigo is more common in older adults. CAUSES  Benign positional vertigo is the result of an upset in the labyrinth system. This is an area in the middle ear that helps control your balance. This may be caused by a viral infection, head injury, or repetitive motion. However, often no specific cause is found. SYMPTOMS  Symptoms of benign positional vertigo occur when you move your head or eyes in different directions. Some of the symptoms may include:  Loss of balance and falls.   Vomiting.   Blurred vision.   Dizziness.   Nausea.   Involuntary eye movements (nystagmus).  DIAGNOSIS  Benign positional vertigo is usually diagnosed by physical exam. If the specific cause of your benign positional vertigo is unknown, your caregiver may perform imaging tests, such as magnetic resonance imaging (MRI) or computed tomography (CT). TREATMENT  Your caregiver may recommend movements or procedures to correct the benign positional vertigo. Medicines such as meclizine, benzodiazepines, and medicines for nausea may be used to treat your symptoms. In rare cases, if your symptoms are caused by certain conditions that affect the inner ear, you may need surgery. HOME CARE INSTRUCTIONS   Follow your caregiver's instructions.   Move slowly. Do not make sudden body or head movements.   Avoid driving.   Avoid operating heavy machinery.   Avoid performing any tasks that would be dangerous to you or others during a vertigo episode.   Drink enough fluids to keep your urine clear or pale yellow.  SEEK IMMEDIATE MEDICAL CARE IF:   You develop problems with walking, weakness, numbness, or using  your arms, hands, or legs.   You have difficulty speaking.   You develop severe headaches.   Your nausea or vomiting continues or gets worse.   You develop visual changes.   Your family or friends notice any behavioral changes.   Your condition gets worse.   You have a fever.   You develop a stiff neck or sensitivity to light.  MAKE SURE YOU:   Understand these instructions.   Will watch your condition.   Will get help right away if you are not doing well or get worse.  Document Released: 10/02/2005 Document Revised: 12/14/2010 Document Reviewed: 09/14/2010 [facility] Patient Information 2012 [place], [place].

## 2011-06-06 NOTE — Progress Notes (Signed)
Subjective:    Patient ID: [name], female    DOB: 12/28/1926, 78 y.o.   MRN: [id number]  HPI  Hospital followup. Patient presented with some nausea and dizziness. Onset the day before admission. She was admitted for 3 days. She described vertigo-type symptoms which are worse with head movement to the right side. She was diagnosed with benign positional vertigo. MRI of the head as well as CT of the head revealed no evidence for stroke or any other acute abnormality. MR angiogram revealed no evidence for vertebral artery occlusion. Potassium minimally low at 3.4 on admission and 4.7 at discharge. INR was somewhat subtherapeutic on admission and 2.05 at discharge. She was given a few doses of Lovenox. She continues to have some mild vertigo symptoms but overall slightly improved. No falls. There were plans to schedule her for neuro rehabilitation with physical therapy for her vertigo. She has not had this set up yet. She's given some home exercises which she is doing. No further nausea or vomiting.  Compliant with regular medications. She is recently taken some Claritin and Flonase for nasal congestion symptoms.  Past Medical History  Diagnosis Date  . B12 DEFICIENCY 11/25/2009  . ESSENTIAL HYPERTENSION 09/30/2009  . CHF 01/27/2009  . SMALL BOWEL OBSTRUCTION 05/05/2008  . DIVERTICULITIS OF COLON 01/27/2009  . OSTEOARTHRITIS, KNEE, RIGHT 02/08/2009  . OSTEOPENIA 01/27/2009  . Left ventricular dysfunction   . Hypertension   . History of echocardiogram 07/25/2009    EF 45 to 50% with mild to moderate MR and TR  . Hemorrhoids   . Rectal bleeding   . Atrial fib/flutter, transient    Past Surgical History  Procedure Date  . Colon surgery 2006    Sigmoid colectomy for diverticulitis  . Abdominal hysterectomy 1996    TAHBSO  . Appendectomy 1951  . Tonsillectomy 1966    reports that she has never smoked. She has never used smokeless tobacco. She reports that she does not drink alcohol or  use illicit drugs. family history includes Emphysema in an unspecified family member and Heart disease in her father, sister, and unspecified family member. Allergies  Allergen Reactions  . Bactrim   . Codeine   . Nitrofurantoin   . Nitrofurantoin Monohyd Macro   . Penicillins   . Sulfamethoxazole W-Trimethoprim       Review of Systems  Constitutional: Negative for fever, chills, appetite change and unexpected weight change.  HENT: Negative for hearing loss and ear discharge.   Respiratory: Negative for cough and shortness of breath.   Cardiovascular: Negative for chest pain.  Neurological: Positive for dizziness. Negative for syncope and headaches.  Hematological: Negative for adenopathy.       Objective:   Physical Exam  Constitutional: She is oriented to person, place, and time. She appears well-developed and well-nourished.  HENT:  Head: Normocephalic.  Right Ear: External ear normal.  Left Ear: External ear normal.  Mouth/Throat: Oropharynx is clear and moist.  Neck: Neck supple.  Cardiovascular: Normal rate and regular rhythm.   Pulmonary/Chest: Effort normal and breath sounds normal. No respiratory distress. She has no wheezes. She has no rales.  Musculoskeletal: She exhibits no edema.  Neurological: She is alert and oriented to person, place, and time. No cranial nerve deficit. Coordination normal.       Cerebellar normal by finger to nose testing          Assessment & Plan:  Vertigo. Probable benign positional vertigo. Patient has some residual symptoms though overall improved.  Schedule for vestibular rehabilitation with physical therapy.  Atrial fibrillation. Rate controlled. Followup in Coumadin clinic in 2 days to reassess

## 2011-06-08 ENCOUNTER — Ambulatory Visit (INDEPENDENT_AMBULATORY_CARE_PROVIDER_SITE_OTHER): Payer: Medicare HMO | Admitting: Pharmacist

## 2011-06-08 ENCOUNTER — Ambulatory Visit (INDEPENDENT_AMBULATORY_CARE_PROVIDER_SITE_OTHER): Payer: Medicare HMO | Admitting: Cardiovascular Disease

## 2011-06-08 ENCOUNTER — Encounter: Payer: Self-pay | Admitting: Cardiovascular Disease

## 2011-06-08 VITALS — BP 132/80 | HR 64 | Ht 66.5 in | Wt 151.0 lb

## 2011-06-08 DIAGNOSIS — I5022 Chronic systolic (congestive) heart failure: Secondary | ICD-10-CM | POA: Insufficient documentation

## 2011-06-08 DIAGNOSIS — I4891 Unspecified atrial fibrillation: Secondary | ICD-10-CM

## 2011-06-08 DIAGNOSIS — Z7901 Long term (current) use of anticoagulants: Secondary | ICD-10-CM

## 2011-06-08 MED ORDER — POTASSIUM CHLORIDE CRYS ER 10 MEQ PO TBCR: EXTENDED_RELEASE_TABLET | ORAL | Status: DC

## 2011-06-08 MED ORDER — FUROSEMIDE 40 MG PO TABS: ORAL_TABLET | ORAL | Status: DC

## 2011-06-08 NOTE — Patient Instructions (Signed)
Your physician recommends that you schedule a follow-up appointment in: 3 months   Your physician has recommended you make the following change in your medication:   Decrease lasix to 40 mg on Monday Wednesday and Friday Decrease potassium to 10 meq Monday Wednesday and friday  Your physician recommends that you return for lab work in: 3 months //bmet

## 2011-06-08 NOTE — Assessment & Plan Note (Signed)
Flow presents today for further evaluation. She has a history of atrial fibrillation. She also has a history of mild chronic systolic congestive heart failure. Her ejection fraction is between 45-50%. She has had benign positional vertigo. She states that she feels better when she does not take her medications. I suspect that she might be slightly volume depleted. We will decrease her Lasix to 40 mg on Mondays, Wednesdays, and Fridays. Will also decrease her potassium to 10 mEq on Mondays, Wednesdays, and Fridays.  She'll continue with her current dose of metoprolol and losartan.

## 2011-06-08 NOTE — Progress Notes (Signed)
[name] Date of Birth  Mar 17, 1926       [facility]    [place] [address]  [address] [place], [place]  [address]   [place], [place]  [address] [phone]     [phone]   Fax  [phone]    Fax [phone]  Problem List: 1. Atrial fibrillation 2. Hypertension 3. Benign Positional Vertigo  History of Present Illness:  [name] is a 78 y.o. female with a history of atrial fibrillation. She was cardioverted in October, 2012. She unfortunately has gone back into atrial fibrillation.  She has felt very well. She's not having any episodes of chest pain or shortness breath. She's tolerating the rhythm quite well. Her only complaint is that she's itching from the patches were placed during the cardioversion. She also complains of having very dry and cracking skin on her hands.   She was recently hospitalized with benign positional vertigo.  She feels better when she does not take her medications. She did not take any of her medications today. Her blood pressure and heart rate are fairly normal today.   Current Outpatient Prescriptions on File Prior to Visit  Medication Sig Dispense Refill  . fluticasone (FLONASE) 50 MCG/ACT nasal spray Place 2 sprays into the nose daily.  16 g  3  . furosemide (LASIX) 40 MG tablet Take 40 mg by mouth daily.       . hydrocortisone (ANUSOL-HC) 25 MG suppository INSERT ONE SUPPOSITORY RECTALLY TWICE DAILY  20 suppository  0  . hydrocortisone (ANUSOL-HC) 25 MG suppository INSERT ONE SUPPOSITORY RECTALLY TWICE DAILY  20 suppository  0  . hydrocortisone (PROCTOSOL HC) 2.5 % rectal cream Place rectally 2 (two) times daily.  30 g  2  . hydrocortisone (PROCTOSOL HC) 2.5 % rectal cream Place rectally 2 (two) times daily.  29 g  2  . loratadine (CLARITIN) 10 MG tablet Take 10 mg by mouth daily.        Marland Kitchen losartan (COZAAR) 50 MG tablet TAKE ONE TABLET BY MOUTH EVERY DAY  30 tablet  3  . metoprolol  (LOPRESSOR) 50 MG tablet Take 50 mg by mouth. Taking 0.5 Tablet BID      . Multiple Vitamin (MULTIVITAMIN) tablet Take 1 tablet by mouth daily.        . Multiple Vitamins-Minerals (ICAPS PO) Take 2 tablets by mouth daily.       . potassium chloride (K-DUR,KLOR-CON) 10 MEQ tablet Take 10 mEq by mouth daily.       Marland Kitchen UNABLE TO FIND OUT PATIENT PHYSICAL THERAPY for vestibular rehab  Diagnosis: vertigo  1 Mutually Defined  0  . warfarin (COUMADIN) 5 MG tablet Take as directed by Anticoagulation clinic.  Pt takes up to 1 1/2 tablets daily.      Marland Kitchen DISCONTD: furosemide (LASIX) 40 MG tablet Take 1 tablet (40 mg total) by mouth daily.  30 tablet  5  . DISCONTD: metoprolol (LOPRESSOR) 50 MG tablet Take 1 tablet (50 mg total) by mouth 2 (two) times daily.  60 tablet  5   Current Facility-Administered Medications on File Prior to Visit  Medication Dose Route Frequency Provider Last Rate Last Dose  . cyanocobalamin ((VITAMIN B-12)) injection 1,000 mcg  1,000 mcg Intramuscular Once [name], MD        Allergies  Allergen Reactions  . Bactrim   . Codeine   . Nitrofurantoin   . Nitrofurantoin Monohyd Macro   .  Penicillins   . Sulfamethoxazole W-Trimethoprim     Past Medical History  Diagnosis Date  . B12 DEFICIENCY 11/25/2009  . ESSENTIAL HYPERTENSION 09/30/2009  . CHF 01/27/2009  . SMALL BOWEL OBSTRUCTION 05/05/2008  . DIVERTICULITIS OF COLON 01/27/2009  . OSTEOARTHRITIS, KNEE, RIGHT 02/08/2009  . OSTEOPENIA 01/27/2009  . Left ventricular dysfunction   . Hypertension   . History of echocardiogram 07/25/2009    EF 45 to 50% with mild to moderate MR and TR  . Hemorrhoids   . Rectal bleeding   . Atrial fib/flutter, transient     Past Surgical History  Procedure Date  . Colon surgery 2006    Sigmoid colectomy for diverticulitis  . Abdominal hysterectomy 1996    TAHBSO  . Appendectomy 1951  . Tonsillectomy 1966    History  Smoking status  . Never Smoker   Smokeless tobacco  .  Never Used    History  Alcohol Use No    Family History  Problem Relation Age of Onset  . Heart disease    . Emphysema    . Heart disease Father   . Heart disease Sister     Reviw of Systems:  Reviewed in the HPI.  All other systems are negative.  Physical Exam: Blood pressure 132/80, pulse 64, height 5' 6.5" (1.689 m), weight 151 lb (68.493 kg). She did not take any of her medications today.  General: Well developed, well nourished, in no acute distress.  Head: Normocephalic, atraumatic, sclera non-icteric, mucus membranes are moist,   Neck: Supple. Carotids are 2 + without bruits. No JVD  Lungs: Clear bilaterally to auscultation.  Heart: Irregularly irregular.  normal  S1 S2. No murmurs, gallops or rubs.  Abdomen: Soft, non-tender, non-distended with normal bowel sounds. No hepatomegaly. No rebound/guarding. No masses.  Msk:  Strength and tone are normal  Extremities: No clubbing or cyanosis. No edema.  Distal pedal pulses are 2+ and equal bilaterally.  Neuro: Alert and oriented X 3. Moves all extremities spontaneously.  Psych:  Responds to questions appropriately with a normal affect.  ECG:  Assessment / Plan:

## 2011-06-08 NOTE — Assessment & Plan Note (Signed)
Her atrial fibrillation is well-controlled. We'll continue with the current dose of metoprolol. Her INR is  therapeutic.

## 2011-06-11 ENCOUNTER — Ambulatory Visit: Payer: Medicare HMO | Attending: Family Medicine | Admitting: Physical Therapy

## 2011-06-11 DIAGNOSIS — R269 Unspecified abnormalities of gait and mobility: Secondary | ICD-10-CM | POA: Insufficient documentation

## 2011-06-11 DIAGNOSIS — IMO0001 Reserved for inherently not codable concepts without codable children: Secondary | ICD-10-CM | POA: Insufficient documentation

## 2011-06-11 DIAGNOSIS — H8110 Benign paroxysmal vertigo, unspecified ear: Secondary | ICD-10-CM | POA: Insufficient documentation

## 2011-06-18 ENCOUNTER — Ambulatory Visit: Payer: Medicare HMO | Admitting: Physical Therapy

## 2011-06-21 ENCOUNTER — Ambulatory Visit: Payer: Medicare HMO | Admitting: Physical Therapy

## 2011-06-26 ENCOUNTER — Other Ambulatory Visit: Payer: Self-pay | Admitting: Internal Medicine

## 2011-06-26 ENCOUNTER — Encounter: Payer: Medicare HMO | Admitting: Physical Therapy

## 2011-06-27 NOTE — Telephone Encounter (Signed)
Dr burchette pt 

## 2011-06-28 ENCOUNTER — Encounter: Payer: Medicare HMO | Admitting: Physical Therapy

## 2011-06-29 ENCOUNTER — Ambulatory Visit (INDEPENDENT_AMBULATORY_CARE_PROVIDER_SITE_OTHER): Payer: Medicare HMO | Admitting: *Deleted

## 2011-06-29 ENCOUNTER — Ambulatory Visit (INDEPENDENT_AMBULATORY_CARE_PROVIDER_SITE_OTHER): Payer: Medicare HMO | Admitting: Family Medicine

## 2011-06-29 DIAGNOSIS — I4891 Unspecified atrial fibrillation: Secondary | ICD-10-CM

## 2011-06-29 DIAGNOSIS — Z7901 Long term (current) use of anticoagulants: Secondary | ICD-10-CM

## 2011-06-29 DIAGNOSIS — E539 Vitamin B deficiency, unspecified: Secondary | ICD-10-CM

## 2011-06-29 MED ORDER — CYANOCOBALAMIN 1000 MCG/ML IJ SOLN
1000.0000 ug | Freq: Once | INTRAMUSCULAR | Status: AC
  Administered 2011-06-29: 1000 ug via INTRAMUSCULAR

## 2011-07-02 ENCOUNTER — Encounter: Payer: Self-pay | Admitting: Family Medicine

## 2011-07-02 ENCOUNTER — Encounter: Payer: Medicare HMO | Admitting: Physical Therapy

## 2011-07-02 ENCOUNTER — Ambulatory Visit (INDEPENDENT_AMBULATORY_CARE_PROVIDER_SITE_OTHER): Payer: Medicare HMO | Admitting: Family Medicine

## 2011-07-02 VITALS — BP 140/90 | Temp 98.6°F | Wt 156.0 lb

## 2011-07-02 DIAGNOSIS — B351 Tinea unguium: Secondary | ICD-10-CM

## 2011-07-02 DIAGNOSIS — I10 Essential (primary) hypertension: Secondary | ICD-10-CM

## 2011-07-02 DIAGNOSIS — H9209 Otalgia, unspecified ear: Secondary | ICD-10-CM

## 2011-07-02 DIAGNOSIS — I4891 Unspecified atrial fibrillation: Secondary | ICD-10-CM

## 2011-07-02 NOTE — Patient Instructions (Addendum)
Ringworm, Nail  A fungal infection of the nail (tinea unguium/onychomycosis) is common. It is common as the visible part of the nail is composed of dead cells which have no blood supply to help prevent infection. It occurs because fungi are everywhere and will pick any opportunity to grow on any dead material.  Because nails are very slow growing they require up to 2 years of treatment with anti-fungal medications. The entire nail back to the base is infected. This includes approximately ? of the nail which you cannot see.  If your caregiver has prescribed a medication by mouth, take it every day and as directed. No progress will be seen for at least 6 to 9 months. Do not be disappointed! Because fungi live on dead cells with little or no exposure to blood supply, medication delivery to the infection is slow; thus the cure is slow. It is also why you can observe no progress in the first 6 months. The nail becoming cured is the base of the nail, as it has the blood supply. Topical medication such as creams and ointments are usually not effective. Important in successful treatment of nail fungus is closely following the medication regimen that your doctor prescribes.  Sometimes you and your caregiver may elect to speed up this process by surgical removal of all the nails. Even this may still require 6 to 9 months of additional oral medications.  See your caregiver as directed. Remember there will be no visible improvement for at least 6 months. See your caregiver sooner if other signs of infection (redness and swelling) develop.  Document Released: 12/23/1999 Document Revised: 12/14/2010 Document Reviewed: 03/02/2008  [employer] Patient Information 2012 [employer].

## 2011-07-02 NOTE — Progress Notes (Signed)
  Subjective:    Patient ID: [name], female    DOB: 22-Feb-1926, 78 y.o.   MRN: [id number]  HPI  Patient has symptoms of left earache. Past several days. No drainage. No hearing changes. Denies any nasal congestion. She had some chronic intermittent vertigo which is unchanged.  Thickened toenails. Mostly nonpainful. Difficulty trimming. She apparently has podiatrist who comes to her place of residence periodically. They've not evaluated. She is using some over-the-counter topical. No history of diabetes.  Hypertension treated with Cozaar and furosemide.  No dizziness. No headaches. Not monitoring blood pressure.  Past Medical History  Diagnosis Date  . B12 DEFICIENCY 11/25/2009  . ESSENTIAL HYPERTENSION 09/30/2009  . CHF 01/27/2009  . SMALL BOWEL OBSTRUCTION 05/05/2008  . DIVERTICULITIS OF COLON 01/27/2009  . OSTEOARTHRITIS, KNEE, RIGHT 02/08/2009  . OSTEOPENIA 01/27/2009  . Left ventricular dysfunction   . Hypertension   . History of echocardiogram 07/25/2009    EF 45 to 50% with mild to moderate MR and TR  . Hemorrhoids   . Rectal bleeding   . Atrial fib/flutter, transient    Past Surgical History  Procedure Date  . Colon surgery 2006    Sigmoid colectomy for diverticulitis  . Abdominal hysterectomy 1996    TAHBSO  . Appendectomy 1951  . Tonsillectomy 1966    reports that she has never smoked. She has never used smokeless tobacco. She reports that she does not drink alcohol or use illicit drugs. family history includes Emphysema in an unspecified family member and Heart disease in her father, sister, and unspecified family member. Allergies  Allergen Reactions  . Bactrim   . Codeine   . Nitrofurantoin   . Nitrofurantoin Monohyd Macro   . Penicillins   . Sulfamethoxazole W-Trimethoprim       Review of Systems  Constitutional: Negative for fatigue.  Eyes: Negative for visual disturbance.  Respiratory: Negative for cough, chest tightness, shortness of breath and  wheezing.   Cardiovascular: Negative for chest pain, palpitations and leg swelling.  Neurological: Negative for dizziness, seizures, syncope, weakness, light-headedness and headaches.       Objective:   Physical Exam  Constitutional: She appears well-developed and well-nourished.  HENT:       Minimal to moderate cerumen right canal the left is clear. Eardrums appear normal.  Neck: Neck supple. No thyromegaly present.  Cardiovascular:       Irregular rhythm but rate control  Pulmonary/Chest: Effort normal and breath sounds normal. No respiratory distress. She has no wheezes. She has no rales.  Lymphadenopathy:    She has no cervical adenopathy.  Skin:       Patient has thickened dysmorphic toenails bilaterally.          Assessment & Plan:  #1 left otalgia with normal exam. Reassurance given. #2 dysmorphic toenails. Probable onychomycosis. Discussed options including Lamisil this point she wishes to wait. She will have podiatrists at place of residence evaluate for trimming. #3 hypertension. Slightly elevated today. Monitoring over the next couple weeks and be in touch if consistently over 140/90 #4 chronic atrial fibrillation. On Coumadin. Rate controlled. Continue to get Coumadin monitored closely

## 2011-07-04 ENCOUNTER — Ambulatory Visit: Payer: Medicare HMO | Admitting: Family Medicine

## 2011-07-05 ENCOUNTER — Encounter: Payer: Medicare HMO | Admitting: Physical Therapy

## 2011-07-13 ENCOUNTER — Ambulatory Visit (INDEPENDENT_AMBULATORY_CARE_PROVIDER_SITE_OTHER): Payer: Medicare HMO | Admitting: *Deleted

## 2011-07-13 DIAGNOSIS — Z7901 Long term (current) use of anticoagulants: Secondary | ICD-10-CM

## 2011-07-13 DIAGNOSIS — I4891 Unspecified atrial fibrillation: Secondary | ICD-10-CM

## 2011-07-13 LAB — POCT INR: INR: 3

## 2011-07-16 ENCOUNTER — Ambulatory Visit: Payer: Medicare HMO | Attending: Family Medicine | Admitting: Physical Therapy

## 2011-07-16 DIAGNOSIS — R269 Unspecified abnormalities of gait and mobility: Secondary | ICD-10-CM | POA: Insufficient documentation

## 2011-07-16 DIAGNOSIS — IMO0001 Reserved for inherently not codable concepts without codable children: Secondary | ICD-10-CM | POA: Insufficient documentation

## 2011-07-16 DIAGNOSIS — H8110 Benign paroxysmal vertigo, unspecified ear: Secondary | ICD-10-CM | POA: Insufficient documentation

## 2011-07-19 ENCOUNTER — Telehealth: Payer: Self-pay | Admitting: Cardiovascular Disease

## 2011-07-19 ENCOUNTER — Encounter: Payer: Medicare HMO | Admitting: Physical Therapy

## 2011-07-19 NOTE — Telephone Encounter (Signed)
New msg Pt wants to talk you about her meds. She is having some side effects Please call

## 2011-07-19 NOTE — Telephone Encounter (Signed)
Pt thinks she is having heartburn and nausea from the Metoprolol or coumadin and lasting about 15 min.  She gets the heartburn about 10-15 minutes after taking both medications.  She takes both meds with food.  She states she is very sensitive to medications.  She states her BP has been running 145/90 (she thinks).

## 2011-07-19 NOTE — Telephone Encounter (Signed)
Have her try Mylanta with the meds to see if that helps.

## 2011-07-20 NOTE — Telephone Encounter (Signed)
Pt called and advised, agreed to plan.

## 2011-07-27 ENCOUNTER — Ambulatory Visit (INDEPENDENT_AMBULATORY_CARE_PROVIDER_SITE_OTHER): Payer: Medicare HMO | Admitting: *Deleted

## 2011-07-27 DIAGNOSIS — Z7901 Long term (current) use of anticoagulants: Secondary | ICD-10-CM

## 2011-07-27 DIAGNOSIS — I4891 Unspecified atrial fibrillation: Secondary | ICD-10-CM

## 2011-07-27 LAB — POCT INR: INR: 2.7

## 2011-08-02 ENCOUNTER — Ambulatory Visit (INDEPENDENT_AMBULATORY_CARE_PROVIDER_SITE_OTHER): Payer: Medicare HMO | Admitting: Family Medicine

## 2011-08-02 DIAGNOSIS — E538 Deficiency of other specified B group vitamins: Secondary | ICD-10-CM

## 2011-08-02 MED ORDER — CYANOCOBALAMIN 1000 MCG/ML IJ SOLN: 1000.0000 ug | Freq: Once | INTRAMUSCULAR | Status: DC

## 2011-08-08 ENCOUNTER — Other Ambulatory Visit: Payer: Self-pay | Admitting: Family Medicine

## 2011-08-12 ENCOUNTER — Other Ambulatory Visit: Payer: Self-pay | Admitting: Family Medicine

## 2011-08-24 ENCOUNTER — Ambulatory Visit (INDEPENDENT_AMBULATORY_CARE_PROVIDER_SITE_OTHER): Payer: Medicare HMO | Admitting: Family Medicine

## 2011-08-24 ENCOUNTER — Ambulatory Visit (INDEPENDENT_AMBULATORY_CARE_PROVIDER_SITE_OTHER): Payer: Medicare HMO

## 2011-08-24 DIAGNOSIS — I4891 Unspecified atrial fibrillation: Secondary | ICD-10-CM

## 2011-08-24 DIAGNOSIS — E538 Deficiency of other specified B group vitamins: Secondary | ICD-10-CM

## 2011-08-24 DIAGNOSIS — Z7901 Long term (current) use of anticoagulants: Secondary | ICD-10-CM

## 2011-08-24 LAB — POCT INR: INR: 2.5

## 2011-08-24 MED ORDER — CYANOCOBALAMIN 1000 MCG/ML IJ SOLN
1000.0000 ug | Freq: Once | INTRAMUSCULAR | Status: AC
  Administered 2011-08-24: 1000 ug via INTRAMUSCULAR

## 2011-09-21 ENCOUNTER — Ambulatory Visit (INDEPENDENT_AMBULATORY_CARE_PROVIDER_SITE_OTHER): Payer: Medicare HMO | Admitting: Family Medicine

## 2011-09-21 DIAGNOSIS — Z23 Encounter for immunization: Secondary | ICD-10-CM

## 2011-09-21 DIAGNOSIS — E538 Deficiency of other specified B group vitamins: Secondary | ICD-10-CM

## 2011-09-21 MED ORDER — CYANOCOBALAMIN 1000 MCG/ML IJ SOLN
1000.0000 ug | Freq: Once | INTRAMUSCULAR | Status: AC
  Administered 2011-09-21: 1000 ug via INTRAMUSCULAR

## 2011-09-24 ENCOUNTER — Encounter: Payer: Self-pay | Admitting: Cardiovascular Disease

## 2011-09-24 ENCOUNTER — Ambulatory Visit (INDEPENDENT_AMBULATORY_CARE_PROVIDER_SITE_OTHER): Payer: Medicare HMO | Admitting: *Deleted

## 2011-09-24 ENCOUNTER — Ambulatory Visit (INDEPENDENT_AMBULATORY_CARE_PROVIDER_SITE_OTHER): Payer: Medicare HMO | Admitting: Cardiovascular Disease

## 2011-09-24 VITALS — BP 142/91 | HR 87 | Ht 67.0 in | Wt 162.4 lb

## 2011-09-24 DIAGNOSIS — Z7901 Long term (current) use of anticoagulants: Secondary | ICD-10-CM

## 2011-09-24 DIAGNOSIS — I4891 Unspecified atrial fibrillation: Secondary | ICD-10-CM

## 2011-09-24 DIAGNOSIS — I509 Heart failure, unspecified: Secondary | ICD-10-CM

## 2011-09-24 DIAGNOSIS — I5022 Chronic systolic (congestive) heart failure: Secondary | ICD-10-CM

## 2011-09-24 DIAGNOSIS — I10 Essential (primary) hypertension: Secondary | ICD-10-CM

## 2011-09-24 NOTE — Assessment & Plan Note (Signed)
Flow presents today for further evaluation. She has a history of atrial fibrillation. She also has a history of mild chronic systolic congestive heart failure. Her ejection fraction is between 45-50%.  She feels better since we decreased her Lasix and potassium to every other day. She has trace ankle edema that appears to be venous insufficiency  She'll continue with her current dose of metoprolol and losartan.

## 2011-09-24 NOTE — Progress Notes (Signed)
[name] Date of Birth  08-Mar-1926       [facility]    [place] [address]  [address] [place], [place]  [address]   [place], [place]  [address] [phone]     [phone]   Fax  [phone]    Fax [phone]  Problem List: 1. Atrial fibrillation 2. Hypertension 3. Benign Positional Vertigo  History of Present Illness:  [name] is a 78 y.o. female with a history of atrial fibrillation. She was cardioverted in October, 2012. She unfortunately has gone back into atrial fibrillation.  She has felt very well. She's not having any episodes of chest pain or shortness breath. She's tolerating the rhythm quite well. Her only complaint is that she's itching from the patches were placed during the cardioversion. She also complains of having very dry and cracking skin on her hands.   Sept. 16, 2013 She has been exercising regularly.  She has gained weight since I last saw her ( 11 lbs.)  She has no complaints of dyspnea or chest pain.   Current Outpatient Prescriptions on File Prior to Visit  Medication Sig Dispense Refill  . aluminum & magnesium hydroxide-simethicone (MYLANTA) 500-450-40 MG/5ML suspension Take 5 mLs by mouth every 6 (six) hours as needed. Use prn for indigestion      . hydrocortisone (ANUSOL-HC) 25 MG suppository INSERT ONE SUPPOSITORY RECTALLY TWICE DAILY  24 suppository  11  . hydrocortisone (PROCTOSOL HC) 2.5 % rectal cream Place rectally 2 (two) times daily.  30 g  2  . loratadine (CLARITIN) 10 MG tablet Take 10 mg by mouth daily.        Marland Kitchen losartan (COZAAR) 50 MG tablet TAKE ONE TABLET BY MOUTH EVERY DAY  30 tablet  3  . metoprolol (LOPRESSOR) 50 MG tablet Take 75 mg by mouth.       . Multiple Vitamin (MULTIVITAMIN) tablet Take 1 tablet by mouth daily.        . Multiple Vitamins-Minerals (ICAPS PO) Take 2 tablets by mouth daily.       Marland Kitchen PROCTOSOL HC 2.5 % rectal cream PLACE RECTALLY TWICE DAILY.  29 g  5  .  UNABLE TO FIND OUT PATIENT PHYSICAL THERAPY for vestibular rehab  Diagnosis: vertigo  1 Mutually Defined  0  . warfarin (COUMADIN) 5 MG tablet Take as directed by Anticoagulation clinic.  Pt takes up to 1 1/2 tablets daily.      Marland Kitchen DISCONTD: hydrocortisone (ANUSOL-HC) 25 MG suppository INSERT ONE SUPPOSITORY RECTALLY TWICE DAILY  20 suppository  0  . furosemide (LASIX) 40 MG tablet Decrease to 40 mg on Monday Wednesday and Friday only, none on other days.  30 tablet  5  . potassium chloride (K-DUR,KLOR-CON) 10 MEQ tablet Take 10 meq on Monday Wednesday and Friday only with the lasix  30 tablet  5  . DISCONTD: furosemide (LASIX) 40 MG tablet Take 1 tablet (40 mg total) by mouth daily.  30 tablet  5  . DISCONTD: metoprolol (LOPRESSOR) 50 MG tablet Take 1 tablet (50 mg total) by mouth 2 (two) times daily.  60 tablet  5   Current Facility-Administered Medications on File Prior to Visit  Medication Dose Route Frequency Provider Last Rate Last Dose  . cyanocobalamin ((VITAMIN B-12)) injection 1,000 mcg  1,000 mcg Intramuscular Once [name], MD        Allergies  Allergen Reactions  . Bactrim   . Codeine   .  Nitrofurantoin   . Nitrofurantoin Monohyd Macro   . Penicillins   . Sulfamethoxazole W-Trimethoprim     Past Medical History  Diagnosis Date  . B12 DEFICIENCY 11/25/2009  . ESSENTIAL HYPERTENSION 09/30/2009  . CHF 01/27/2009  . SMALL BOWEL OBSTRUCTION 05/05/2008  . DIVERTICULITIS OF COLON 01/27/2009  . OSTEOARTHRITIS, KNEE, RIGHT 02/08/2009  . OSTEOPENIA 01/27/2009  . Left ventricular dysfunction   . Hypertension   . History of echocardiogram 07/25/2009    EF 45 to 50% with mild to moderate MR and TR  . Hemorrhoids   . Rectal bleeding   . Atrial fib/flutter, transient     Past Surgical History  Procedure Date  . Colon surgery 2006    Sigmoid colectomy for diverticulitis  . Abdominal hysterectomy 1996    TAHBSO  . Appendectomy 1951  . Tonsillectomy 1966    History    Smoking status  . Never Smoker   Smokeless tobacco  . Never Used    History  Alcohol Use No    Family History  Problem Relation Age of Onset  . Heart disease    . Emphysema    . Heart disease Father   . Heart disease Sister     Reviw of Systems:  Reviewed in the HPI.  All other systems are negative.  Physical Exam: Blood pressure 142/91, pulse 87, height 5\' 7"  (1.702 m), weight 162 lb 6.4 oz (73.664 kg). She did not take any of her medications today.  General: Well developed, well nourished, in no acute distress.  Head: Normocephalic, atraumatic, sclera non-icteric, mucus membranes are moist,   Neck: Supple. Carotids are 2 + without bruits. No JVD  Lungs: Clear bilaterally to auscultation.  Heart: Irregularly irregular.  normal  S1 S2. No murmurs, gallops or rubs.  Abdomen: Soft, non-tender, non-distended with normal bowel sounds. No hepatomegaly. No rebound/guarding. No masses.  Msk:  Strength and tone are normal  Extremities: No clubbing or cyanosis. No edema.  Distal pedal pulses are 2+ and equal bilaterally.  Neuro: Alert and oriented X 3. Moves all extremities spontaneously.  Psych:  Responds to questions appropriately with a normal affect.  ECG: September 24, 2011 - Atrial Fibrillation at 35.  LAD, NS ST/T wave changes.  Assessment / Plan:

## 2011-09-24 NOTE — Assessment & Plan Note (Signed)
Stable, rate is well controlled.  Continue metoprolol for rate control.

## 2011-09-24 NOTE — Patient Instructions (Addendum)
Your physician wants you to follow-up in: 6 MONTHS WITH DR. Elease Hashimoto You will receive a reminder letter in the mail two months in advance. If you don't receive a letter, please call our office to schedule the follow-up appointment.  Your physician recommends that you return for a FASTING lipid profile: LIPID,LIVER,BMET  Your physician recommends that you continue on your current medications as directed. Please refer to the Current Medication list given to you today.

## 2011-09-26 ENCOUNTER — Encounter: Payer: Self-pay | Admitting: Cardiovascular Disease

## 2011-09-26 ENCOUNTER — Encounter: Payer: Self-pay | Admitting: *Deleted

## 2011-10-04 ENCOUNTER — Other Ambulatory Visit: Payer: Self-pay | Admitting: Family Medicine

## 2011-10-05 ENCOUNTER — Encounter: Payer: Self-pay | Admitting: Family Medicine

## 2011-10-05 ENCOUNTER — Ambulatory Visit (INDEPENDENT_AMBULATORY_CARE_PROVIDER_SITE_OTHER): Payer: Medicare HMO | Admitting: Family Medicine

## 2011-10-05 VITALS — BP 130/88 | Temp 98.2°F | Wt 159.0 lb

## 2011-10-05 DIAGNOSIS — H6120 Impacted cerumen, unspecified ear: Secondary | ICD-10-CM

## 2011-10-05 DIAGNOSIS — L609 Nail disorder, unspecified: Secondary | ICD-10-CM

## 2011-10-05 DIAGNOSIS — I4891 Unspecified atrial fibrillation: Secondary | ICD-10-CM

## 2011-10-05 DIAGNOSIS — R609 Edema, unspecified: Secondary | ICD-10-CM

## 2011-10-05 DIAGNOSIS — M25473 Effusion, unspecified ankle: Secondary | ICD-10-CM

## 2011-10-05 NOTE — Progress Notes (Signed)
Subjective:    Patient ID: [name], female    DOB: 1926-04-09, 78 y.o.   MRN: [id number]  HPI  Patient here for medical followup. She has history of atrial fibrillation, osteoarthritis, macular degeneration, peripheral neuropathy, GERD, chronic hemorrhoids, hypertension, and B12 deficiency. She remains on Coumadin with recent INR 2.0. No bleeding complications other than occasional bleeding from hemorrhoids. She has battled with constipation issues off and on but currently stable. No recent chest pains or dyspnea. She is requesting consideration for getting Coumadin checked here because of proximity to her residence. Currently followed the cardiology clinic for this  Right ear fullness and irritation. Frequent itching. No discharge. No hearing changes.  Patient has dysmorphic right index fingernail. Recently partially torn off and requesting that this be clipped.  Recent left ankle and lower leg swelling. No associated pain. No injury. No redness or warmth.  Patient is already received her flu vaccine.  Past Medical History  Diagnosis Date  . B12 DEFICIENCY 11/25/2009  . ESSENTIAL HYPERTENSION 09/30/2009  . CHF 01/27/2009  . SMALL BOWEL OBSTRUCTION 05/05/2008  . DIVERTICULITIS OF COLON 01/27/2009  . OSTEOARTHRITIS, KNEE, RIGHT 02/08/2009  . OSTEOPENIA 01/27/2009  . Left ventricular dysfunction   . Hypertension   . History of echocardiogram 07/25/2009    EF 45 to 50% with mild to moderate MR and TR  . Hemorrhoids   . Rectal bleeding   . Atrial fib/flutter, transient    Past Surgical History  Procedure Date  . Colon surgery 2006    Sigmoid colectomy for diverticulitis  . Abdominal hysterectomy 1996    TAHBSO  . Appendectomy 1951  . Tonsillectomy 1966    reports that she has never smoked. She has never used smokeless tobacco. She reports that she does not drink alcohol or use illicit drugs. family history includes Emphysema in an unspecified family member and Heart disease in  her father, sister, and unspecified family member. Allergies  Allergen Reactions  . Bactrim   . Codeine   . Nitrofurantoin   . Nitrofurantoin Monohyd Macro   . Penicillins   . Sulfamethoxazole W-Trimethoprim       Review of Systems  Constitutional: Negative for fatigue.  HENT: Negative for congestion and ear discharge.   Eyes: Negative for visual disturbance.  Respiratory: Negative for cough, chest tightness, shortness of breath and wheezing.   Cardiovascular: Negative for chest pain, palpitations and leg swelling.  Gastrointestinal: Negative for blood in stool.  Genitourinary: Negative for dysuria.  Neurological: Negative for dizziness, seizures, syncope, weakness, light-headedness and headaches.       Objective:   Physical Exam  Constitutional: She is oriented to person, place, and time. She appears well-developed and well-nourished.  HENT:  Mouth/Throat: Oropharynx is clear and moist.       Impaction cerumen right canal. Removed with curette without difficulty  Neck: Neck supple. No thyromegaly present.  Cardiovascular: Normal rate.        Irregular heart rhythm rate controlled  Pulmonary/Chest: Effort normal and breath sounds normal. No respiratory distress. She has no wheezes. She has no rales.  Musculoskeletal:       Mild edema left lower leg though nontender. No warmth. No erythema. Full range of motion left ankle and knee. No calf tenderness. Normal distal pulses  Neurological: She is alert and oriented to person, place, and time.          Assessment & Plan:  #1 atrial fibrillation. Rate controlled. Patient requesting Coumadin reassess through Coumadin clinic here secondary  proximity and transportation issues with other clinic. We'll check into this #2 cerumen impaction right canal. Removed with curette  #3 left ankle edema. Mild. Frequent elevation. She continues to use low-dose furosemide as needed #4 dysmorphic fingernail-from trauma.  Loose segment clipped  away.

## 2011-10-19 ENCOUNTER — Encounter: Payer: Self-pay | Admitting: Family Medicine

## 2011-10-19 ENCOUNTER — Ambulatory Visit (INDEPENDENT_AMBULATORY_CARE_PROVIDER_SITE_OTHER): Payer: Medicare HMO | Admitting: Family Medicine

## 2011-10-19 VITALS — BP 140/90 | Temp 98.2°F | Wt 164.0 lb

## 2011-10-19 DIAGNOSIS — J209 Acute bronchitis, unspecified: Secondary | ICD-10-CM

## 2011-10-19 MED ORDER — BENZONATATE 200 MG PO CAPS: 200.0000 mg | ORAL_CAPSULE | Freq: Three times a day (TID) | ORAL | Status: DC | PRN

## 2011-10-19 NOTE — Progress Notes (Signed)
  Subjective:    Patient ID: [name], female    DOB: 08-21-1926, 78 y.o.   MRN: [id number]  HPI  Acute visit. Nonsmoker seen with 4 day history of cough. Cough mostly nonproductive. No fever. No chills. Mild nasal congestion. Mild body aches. No nausea or vomiting. Taking over-the-counter Tussin without much relief.  She denies any dyspnea.   Review of Systems  Constitutional: Positive for fatigue. Negative for fever and chills.  HENT: Positive for congestion. Negative for sore throat.   Respiratory: Positive for cough. Negative for shortness of breath and wheezing.   Cardiovascular: Negative for chest pain.  Neurological: Negative for headaches.       Objective:   Physical Exam  Constitutional: She appears well-developed and well-nourished.  HENT:  Right Ear: External ear normal.  Left Ear: External ear normal.  Mouth/Throat: Oropharynx is clear and moist.  Neck: Neck supple.  Cardiovascular: Normal rate and regular rhythm.   Pulmonary/Chest: Effort normal and breath sounds normal. No respiratory distress. She has no wheezes. She has no rales.          Assessment & Plan:  Cough probably secondary to acute viral bronchitis. No clinical evidence for pneumonia. Tessalon Perles 200 mg every 8 hours as needed for cough. Followup probably for fever or increased shortness of breath

## 2011-10-19 NOTE — Patient Instructions (Addendum)
Consider plain mucinex twice daily for cough Follow up promptly for any fever or worsening cough

## 2011-10-22 ENCOUNTER — Telehealth: Payer: Self-pay | Admitting: Family Medicine

## 2011-10-22 ENCOUNTER — Ambulatory Visit (INDEPENDENT_AMBULATORY_CARE_PROVIDER_SITE_OTHER): Payer: Medicare HMO | Admitting: Family

## 2011-10-22 DIAGNOSIS — Z7901 Long term (current) use of anticoagulants: Secondary | ICD-10-CM

## 2011-10-22 DIAGNOSIS — I4891 Unspecified atrial fibrillation: Secondary | ICD-10-CM

## 2011-10-22 NOTE — Telephone Encounter (Signed)
Reason for call: Dr. Caryl Never gave Rx for Mucinex on 10/11.  [facility] did not have "Mucinex" brand elixir that she has taken before, however, after consulting with pharmacist patient purchased generic Guiafenesin tablets instead.  Wants to know if this is ok.  Advised patient that the active ingredient "Guaifenesin" is same in Mucinex and that elixir does the same thing as tablets - that she will be ok to take the generic tablets.  Advised to call back if symptoms do not improve or if new symptoms develop.  Patient agrees.

## 2011-10-22 NOTE — Patient Instructions (Addendum)
Today only, take 1.5 tabs. Then continue same dose to 1 pill everyday except 1/2 pill on Fridays. Recheck in 4 weeks.     Latest dosing instructions   Total Sun Mon Tue Wed Thu Fri Sat   32.5 5 mg 5 mg 5 mg 5 mg 5 mg 2.5 mg 5 mg    (5 mg1) (5 mg1) (5 mg1) (5 mg1) (5 mg1) (5 mg0.5) (5 mg1)

## 2011-10-27 ENCOUNTER — Other Ambulatory Visit: Payer: Self-pay | Admitting: Family Medicine

## 2011-10-29 ENCOUNTER — Ambulatory Visit: Payer: Medicare HMO | Admitting: Family Medicine

## 2011-10-29 ENCOUNTER — Encounter: Payer: Self-pay | Admitting: Family Medicine

## 2011-10-29 ENCOUNTER — Ambulatory Visit (INDEPENDENT_AMBULATORY_CARE_PROVIDER_SITE_OTHER): Payer: Medicare HMO | Admitting: Family Medicine

## 2011-10-29 VITALS — BP 150/84 | HR 111 | Temp 98.6°F | Wt 159.0 lb

## 2011-10-29 DIAGNOSIS — E538 Deficiency of other specified B group vitamins: Secondary | ICD-10-CM

## 2011-10-29 DIAGNOSIS — J069 Acute upper respiratory infection, unspecified: Secondary | ICD-10-CM

## 2011-10-29 DIAGNOSIS — J40 Bronchitis, not specified as acute or chronic: Secondary | ICD-10-CM

## 2011-10-29 MED ORDER — CYANOCOBALAMIN 1000 MCG/ML IJ SOLN
1000.0000 ug | Freq: Once | INTRAMUSCULAR | Status: AC
  Administered 2011-10-29: 1000 ug via INTRAMUSCULAR

## 2011-10-29 MED ORDER — AZITHROMYCIN 250 MG PO TABS: ORAL_TABLET | ORAL | Status: DC

## 2011-10-29 MED ORDER — FLUTICASONE PROPIONATE 50 MCG/ACT NA SUSP: 2.0000 | Freq: Every day | NASAL | Status: DC

## 2011-10-29 MED ORDER — BENZONATATE 100 MG PO CAPS: 100.0000 mg | ORAL_CAPSULE | Freq: Two times a day (BID) | ORAL | Status: DC | PRN

## 2011-10-29 NOTE — Patient Instructions (Addendum)
-  please see coumadin clinic next Monday at 1pm  -please take medications as instructed  -NSTRUCTIONS FOR UPPER RESPIRATORY INFECTION:  -plenty of rest and fluids  -nasal saline wash 2-3 times daily (use prepackaged nasal saline or bottled/distilled water if making your own)   -clean nose with nasal saline before using the nasal steroid or sinex  -in the winter time, using a humidifier at night is helpful (please follow cleaning instructions)  -if you are taking a cough medication - use only as directed, may also try a teaspoon of honey to coat the throat and throat lozenges  -for sore throat, salt water gargles can help  -follow up if you have fevers, are worsening or not getting better in 5-7 days

## 2011-10-29 NOTE — Addendum Note (Signed)
Addended by: Melchor Amour on: 10/29/2011 09:28 AM   Modules accepted: Orders

## 2011-10-29 NOTE — Progress Notes (Signed)
Chief Complaint  Patient presents with  . Bronchitis    HPI:  Thinks she has bronchitis: -started 2 weeks ago -cough, clears throat occ, wheezing breathing, nasal congestion and drainage in throat -seen by PCP Oct 11th and took tessalon perles, but out of this -works at [employer], others with similar symptoms -Denis: fever, weight change, NVD, change in SOB  ROS: See pertinent positives and negatives per HPI.  Past Medical History  Diagnosis Date  . B12 DEFICIENCY 11/25/2009  . ESSENTIAL HYPERTENSION 09/30/2009  . CHF 01/27/2009  . SMALL BOWEL OBSTRUCTION 05/05/2008  . DIVERTICULITIS OF COLON 01/27/2009  . OSTEOARTHRITIS, KNEE, RIGHT 02/08/2009  . OSTEOPENIA 01/27/2009  . Left ventricular dysfunction   . Hypertension   . History of echocardiogram 07/25/2009    EF 45 to 50% with mild to moderate MR and TR  . Hemorrhoids   . Rectal bleeding   . Atrial fib/flutter, transient     Family History  Problem Relation Age of Onset  . Heart disease    . Emphysema    . Heart disease Father   . Heart disease Sister     History   Social History  . Marital Status: Widowed    Spouse Name: N/A    Number of Children: N/A  . Years of Education: N/A   Social History Main Topics  . Smoking status: Never Smoker   . Smokeless tobacco: Never Used  . Alcohol Use: No  . Drug Use: No  . Sexually Active: None   Other Topics Concern  . None   Social History Narrative  . None    Current outpatient prescriptions:furosemide (LASIX) 40 MG tablet, Decrease to 40 mg on Monday Wednesday and Friday only, none on other days., Disp: 30 tablet, Rfl: 5;  hydrocortisone (ANUSOL-HC) 25 MG suppository, INSERT ONE SUPPOSITORY RECTALLY TWICE DAILY, Disp: 24 suppository, Rfl: 11;  loratadine (CLARITIN) 10 MG tablet, Take 10 mg by mouth daily.  , Disp: , Rfl:  losartan (COZAAR) 50 MG tablet, TAKE ONE TABLET BY MOUTH EVERY DAY, Disp: 90 tablet, Rfl: 3;  metoprolol (LOPRESSOR) 50 MG tablet, Take 75 mg  by mouth. , Disp: , Rfl: ;  Multiple Vitamin (MULTIVITAMIN) tablet, Take 1 tablet by mouth daily.  , Disp: , Rfl: ;  Multiple Vitamins-Minerals (ICAPS PO), Take 2 tablets by mouth daily. , Disp: , Rfl:  potassium chloride (K-DUR,KLOR-CON) 10 MEQ tablet, Take 10 meq on Monday Wednesday and Friday only with the lasix, Disp: 30 tablet, Rfl: 5;  PROCTOSOL HC 2.5 % rectal cream, PLACE RECTALLY TWICE DAILY., Disp: 29 g, Rfl: 5;  warfarin (COUMADIN) 5 MG tablet, Take as directed by Anticoagulation clinic.  Pt takes up to 1 1/2 tablets daily., Disp: , Rfl:  aluminum & magnesium hydroxide-simethicone (MYLANTA) 500-450-40 MG/5ML suspension, Take 5 mLs by mouth every 6 (six) hours as needed. Use prn for indigestion, Disp: , Rfl: ;  azithromycin (ZITHROMAX) 250 MG tablet, 2 tabs on day one, the 1 tab daily for next 4 days, Disp: 6 tablet, Rfl: 0;  benzonatate (TESSALON) 100 MG capsule, Take 1 capsule (100 mg total) by mouth 2 (two) times daily as needed for cough., Disp: 20 capsule, Rfl: 0 fluticasone (FLONASE) 50 MCG/ACT nasal spray, Place 2 sprays into the nose daily., Disp: 16 g, Rfl: 0 Current facility-administered medications:cyanocobalamin ((VITAMIN B-12)) injection 1,000 mcg, 1,000 mcg, Intramuscular, Once, [name], MD  EXAM:  Filed Vitals:   10/29/11 0848  BP: 150/84  Pulse: 111  Temp:  98.6 F (37 C)    There is no height on file to calculate BMI.  GENERAL: vitals reviewed and listed above, alert, oriented, appears well hydrated and in no acute distress  HEENT: atraumatic, conjunttiva clear, no obvious abnormalities on inspection of external nose and ears, ear canals clear, TMs normal, nasal congestion and PND  NECK: no obvious masses on inspection  LUNGS: clear to auscultation bilaterally, no wheezes, rales or rhonchi, good air movement  CV: HRRR, no peripheral edema  MS: moves all extremities without noticeable abnormality  PSYCH: pleasant and cooperative, no obvious depression  or anxiety  ASSESSMENT AND PLAN:  Discussed the following assessment and plan:  1. Upper respiratory infection  fluticasone (FLONASE) 50 MCG/ACT nasal spray, azithromycin (ZITHROMAX) 250 MG tablet  2. Bronchitis  benzonatate (TESSALON) 100 MG capsule   -feel symptoms more likely due to allergic or viral etiology per exam findings, though given ongoing symptoms for several weeks abx warrented, feel cough more likely related to PND -discussed tx options and risks/benefits, lung CTA, will do flonase, azithro as pt reports taking these in the past without concerns - discussed can impact INR and scheduled coumadin clinic visit in 1 week. Refill on cough medication provided. -pt to follow up with her doctor in 1-2 weeks -Patient advised to return or notify a doctor immediately if symptoms worsen or persist or new concerns arise. -follow up with PCP in 2 weeks for blood pressure recheck  Patient Instructions  -please see coumadin clinic next Monday at 1pm  -please take medications as instructed  -NSTRUCTIONS FOR UPPER RESPIRATORY INFECTION:  -plenty of rest and fluids  -nasal saline wash 2-3 times daily (use prepackaged nasal saline or bottled/distilled water if making your own)   -clean nose with nasal saline before using the nasal steroid or sinex  -in the winter time, using a humidifier at night is helpful (please follow cleaning instructions)  -if you are taking a cough medication - use only as directed, may also try a teaspoon of honey to coat the throat and throat lozenges  -for sore throat, salt water gargles can help  -follow up if you have fevers, are worsening or not getting better in 5-7 days      Samanyu Tinnell R.

## 2011-11-05 ENCOUNTER — Ambulatory Visit (INDEPENDENT_AMBULATORY_CARE_PROVIDER_SITE_OTHER): Payer: Medicare HMO | Admitting: Family

## 2011-11-05 DIAGNOSIS — Z7901 Long term (current) use of anticoagulants: Secondary | ICD-10-CM

## 2011-11-05 DIAGNOSIS — I4891 Unspecified atrial fibrillation: Secondary | ICD-10-CM

## 2011-11-05 LAB — POCT INR: INR: 2

## 2011-11-05 NOTE — Patient Instructions (Signed)
1 pill everyday except 1/2 pill on Fridays. Recheck in 6 weeks.     Latest dosing instructions   Total Sun Mon Tue Wed Thu Fri Sat   32.5 5 mg 5 mg 5 mg 5 mg 5 mg 2.5 mg 5 mg    (5 mg1) (5 mg1) (5 mg1) (5 mg1) (5 mg1) (5 mg0.5) (5 mg1)        

## 2011-11-09 ENCOUNTER — Other Ambulatory Visit: Payer: Self-pay | Admitting: Family Medicine

## 2011-11-09 NOTE — Telephone Encounter (Signed)
done

## 2011-11-09 NOTE — Telephone Encounter (Signed)
Last written 10-29-11 by Dr. Selena Batten  #20  [name] Please advise

## 2011-11-09 NOTE — Telephone Encounter (Signed)
Refill once 

## 2011-11-19 ENCOUNTER — Other Ambulatory Visit: Payer: Self-pay | Admitting: Family Medicine

## 2011-11-19 ENCOUNTER — Encounter: Payer: Medicare HMO | Admitting: Family

## 2011-11-30 ENCOUNTER — Ambulatory Visit (INDEPENDENT_AMBULATORY_CARE_PROVIDER_SITE_OTHER): Payer: Medicare HMO | Admitting: Family Medicine

## 2011-11-30 DIAGNOSIS — E538 Deficiency of other specified B group vitamins: Secondary | ICD-10-CM

## 2011-11-30 MED ORDER — CYANOCOBALAMIN 1000 MCG/ML IJ SOLN
1000.0000 ug | Freq: Once | INTRAMUSCULAR | Status: AC
  Administered 2011-11-30: 1000 ug via INTRAMUSCULAR

## 2011-12-13 ENCOUNTER — Other Ambulatory Visit: Payer: Self-pay | Admitting: Family Medicine

## 2011-12-18 ENCOUNTER — Encounter: Payer: Medicare HMO | Admitting: Family

## 2011-12-19 ENCOUNTER — Ambulatory Visit (INDEPENDENT_AMBULATORY_CARE_PROVIDER_SITE_OTHER): Payer: Medicare HMO | Admitting: Family Medicine

## 2011-12-19 ENCOUNTER — Encounter: Payer: Self-pay | Admitting: Family Medicine

## 2011-12-19 ENCOUNTER — Ambulatory Visit (INDEPENDENT_AMBULATORY_CARE_PROVIDER_SITE_OTHER): Payer: Medicare HMO | Admitting: Family

## 2011-12-19 DIAGNOSIS — E538 Deficiency of other specified B group vitamins: Secondary | ICD-10-CM

## 2011-12-19 DIAGNOSIS — I4891 Unspecified atrial fibrillation: Secondary | ICD-10-CM

## 2011-12-19 DIAGNOSIS — Z7901 Long term (current) use of anticoagulants: Secondary | ICD-10-CM

## 2011-12-19 LAB — POCT INR: INR: 2.3

## 2011-12-19 MED ORDER — CYANOCOBALAMIN 1000 MCG/ML IJ SOLN: 1000.0000 ug | Freq: Once | INTRAMUSCULAR | Status: DC

## 2011-12-19 NOTE — Patient Instructions (Signed)
1 pill everyday except 1/2 pill on Fridays. Recheck in 6 weeks.     Latest dosing instructions   Total Sun Mon Tue Wed Thu Fri Sat   32.5 5 mg 5 mg 5 mg 5 mg 5 mg 2.5 mg 5 mg    (5 mg1) (5 mg1) (5 mg1) (5 mg1) (5 mg1) (5 mg0.5) (5 mg1)        

## 2011-12-23 ENCOUNTER — Other Ambulatory Visit: Payer: Self-pay | Admitting: Family Medicine

## 2011-12-26 ENCOUNTER — Other Ambulatory Visit: Payer: Self-pay | Admitting: *Deleted

## 2011-12-26 DIAGNOSIS — J069 Acute upper respiratory infection, unspecified: Secondary | ICD-10-CM

## 2011-12-26 MED ORDER — FLUTICASONE PROPIONATE 50 MCG/ACT NA SUSP: 2.0000 | Freq: Every day | NASAL | Status: DC

## 2012-01-08 ENCOUNTER — Other Ambulatory Visit: Payer: Self-pay | Admitting: Family Medicine

## 2012-01-08 DIAGNOSIS — Z1231 Encounter for screening mammogram for malignant neoplasm of breast: Secondary | ICD-10-CM

## 2012-01-14 ENCOUNTER — Other Ambulatory Visit: Payer: Self-pay | Admitting: Family Medicine

## 2012-01-17 ENCOUNTER — Encounter: Payer: Self-pay | Admitting: Family Medicine

## 2012-01-17 ENCOUNTER — Ambulatory Visit (INDEPENDENT_AMBULATORY_CARE_PROVIDER_SITE_OTHER): Payer: Medicare HMO | Admitting: Family Medicine

## 2012-01-17 VITALS — BP 140/90 | Temp 98.9°F | Wt 164.0 lb

## 2012-01-17 DIAGNOSIS — J069 Acute upper respiratory infection, unspecified: Secondary | ICD-10-CM

## 2012-01-17 DIAGNOSIS — E538 Deficiency of other specified B group vitamins: Secondary | ICD-10-CM

## 2012-01-17 DIAGNOSIS — R21 Rash and other nonspecific skin eruption: Secondary | ICD-10-CM

## 2012-01-17 MED ORDER — TRIAMCINOLONE ACETONIDE 0.1 % EX CREA: TOPICAL_CREAM | Freq: Two times a day (BID) | CUTANEOUS | Status: DC

## 2012-01-17 MED ORDER — CYANOCOBALAMIN 1000 MCG/ML IJ SOLN
1000.0000 ug | Freq: Once | INTRAMUSCULAR | Status: AC
  Administered 2012-01-17: 1000 ug via INTRAMUSCULAR

## 2012-01-17 NOTE — Patient Instructions (Addendum)

## 2012-01-17 NOTE — Progress Notes (Signed)
  Subjective:    Patient ID: [name], female    DOB: 1926/12/21, 79 y.o.   MRN: [id number]  HPI  Onset last Friday nasal congestion, ear congestion, sore throat, chills (no fever), headaches. Thick yellow nasal mucus.  On Mucus Relief DM.  Fatigue.  No body aches.  Some nausea but no vomiting.  Overall feels stable. Nonsmoker.  Pruritic rash mid back region. Tried over-the-counter hydrocortisone cream without much improvement   Review of Systems  Constitutional: Negative for fever and chills.  HENT: Positive for ear pain, congestion and sore throat. Negative for ear discharge.   Respiratory: Positive for cough.        Objective:   Physical Exam  Constitutional: She appears well-developed and well-nourished. No distress.  HENT:  Right Ear: External ear normal.  Left Ear: External ear normal.  Mouth/Throat: Oropharynx is clear and moist.  Neck: Neck supple.  Cardiovascular: Normal rate.   Pulmonary/Chest: Effort normal and breath sounds normal. No respiratory distress. She has no wheezes. She has no rales.  Lymphadenopathy:    She has no cervical adenopathy.  Skin: Rash noted.       Has nonspecific dry somewhat excoriated rash  right and left mid thoracic region. No vesicles. No pustules. Slightly scaly          Assessment & Plan:  #1 viral URI. Reassurance. Continue over-the-counter medication with [facility]. Lots of fluids. Followup as needed for fever or worsening symptoms #2 nonspecific eczema back. Triamcinolone 0.1% cream twice daily as needed

## 2012-01-17 NOTE — Addendum Note (Signed)
Addended by: Melchor Amour on: 01/17/2012 10:33 AM   Modules accepted: Orders

## 2012-01-23 ENCOUNTER — Telehealth: Payer: Self-pay | Admitting: Cardiovascular Disease

## 2012-01-23 NOTE — Telephone Encounter (Signed)
New Problem:    Patient called in needing a refill of her warfarin (COUMADIN) 5 MG tablet sent into the [facility] pharmacy listed on her profile.

## 2012-01-24 ENCOUNTER — Other Ambulatory Visit: Payer: Self-pay | Admitting: Family Medicine

## 2012-01-24 MED ORDER — WARFARIN SODIUM 5 MG PO TABS: 5.0000 mg | ORAL_TABLET | Freq: Every day | ORAL | Status: DC

## 2012-01-24 NOTE — Telephone Encounter (Signed)
Sent to pharmacy 

## 2012-01-30 ENCOUNTER — Encounter: Payer: Medicare HMO | Admitting: Family

## 2012-02-04 ENCOUNTER — Ambulatory Visit (INDEPENDENT_AMBULATORY_CARE_PROVIDER_SITE_OTHER): Payer: Medicare HMO | Admitting: Family

## 2012-02-04 DIAGNOSIS — I4891 Unspecified atrial fibrillation: Secondary | ICD-10-CM

## 2012-02-04 DIAGNOSIS — Z7901 Long term (current) use of anticoagulants: Secondary | ICD-10-CM

## 2012-02-04 NOTE — Patient Instructions (Signed)
1 pill everyday except 1/2 pill on Fridays. Recheck in 6 weeks.     Latest dosing instructions   Total Sun Mon Tue Wed Thu Fri Sat   32.5 5 mg 5 mg 5 mg 5 mg 5 mg 2.5 mg 5 mg    (5 mg1) (5 mg1) (5 mg1) (5 mg1) (5 mg1) (5 mg0.5) (5 mg1)

## 2012-02-09 ENCOUNTER — Other Ambulatory Visit: Payer: Self-pay | Admitting: Family Medicine

## 2012-02-13 ENCOUNTER — Ambulatory Visit
Admission: RE | Admit: 2012-02-13 | Discharge: 2012-02-13 | Disposition: A | Discharge status: Home / Self Care | Payer: Medicare HMO | Source: Ambulatory Visit | Attending: Family Medicine | Admitting: Family Medicine

## 2012-02-13 DIAGNOSIS — Z1231 Encounter for screening mammogram for malignant neoplasm of breast: Secondary | ICD-10-CM

## 2012-02-25 ENCOUNTER — Telehealth: Payer: Self-pay | Admitting: Family Medicine

## 2012-02-25 MED ORDER — BENZONATATE 200 MG PO CAPS: 200.0000 mg | ORAL_CAPSULE | Freq: Three times a day (TID) | ORAL | Status: DC | PRN

## 2012-02-25 NOTE — Telephone Encounter (Signed)
Tessalon pearls refill request, done

## 2012-02-25 NOTE — Telephone Encounter (Signed)
Pt would like nancy to return her call. Pt is aware b12 inj on back order

## 2012-03-03 ENCOUNTER — Encounter: Payer: Self-pay | Admitting: Family Medicine

## 2012-03-03 ENCOUNTER — Ambulatory Visit (INDEPENDENT_AMBULATORY_CARE_PROVIDER_SITE_OTHER): Payer: Medicare HMO | Admitting: Family Medicine

## 2012-03-03 VITALS — BP 110/80 | HR 95 | Temp 98.5°F | Wt 168.0 lb

## 2012-03-03 DIAGNOSIS — J45998 Other asthma: Secondary | ICD-10-CM

## 2012-03-03 DIAGNOSIS — J45909 Unspecified asthma, uncomplicated: Secondary | ICD-10-CM

## 2012-03-03 DIAGNOSIS — J069 Acute upper respiratory infection, unspecified: Secondary | ICD-10-CM

## 2012-03-03 MED ORDER — AZITHROMYCIN 250 MG PO TABS: ORAL_TABLET | ORAL | Status: DC

## 2012-03-03 MED ORDER — ALBUTEROL SULFATE HFA 108 (90 BASE) MCG/ACT IN AERS: 2.0000 | INHALATION_SPRAY | Freq: Four times a day (QID) | RESPIRATORY_TRACT | Status: DC | PRN

## 2012-03-03 NOTE — Patient Instructions (Addendum)
INSTRUCTIONS FOR UPPER RESPIRATORY INFECTION:  -plenty of rest and fluids  -As we discussed, we have prescribed a new medication (AZITHROMYCIN) for you at this appointment. We discussed the common and serious potential adverse effects of this medication and you can review these and more with the pharmacist when you pick up your medication.  Please follow the instructions for use carefully and notify us immediately if you have any problems taking this medication.  -nasal saline wash 2-3 times daily (use prepackaged nasal saline or bottled/distilled water if making your own)   -can use sinex nasal spray for drainage and nasal congestion - but do NOT use longer then 3-4 days  -can use tylenol or ibuprofen as directed for aches and sorethroat  -in the winter time, using a humidifier at night is helpful (please follow cleaning instructions)  -if you are taking a cough medication - use only as directed, may also try a teaspoon of honey to coat the throat and throat lozenges  -for sore throat, salt water gargles can help  -follow up if you have fevers, facial pain, tooth pain, difficulty breathing or are worsening or not getting better in 5-7 days

## 2012-03-03 NOTE — Progress Notes (Signed)
Chief Complaint  Patient presents with  . breathing and wheezing problems    HPI:  Acute visit for coughing and wheezing: -started 2 weeks ago -symptoms: cough - productive, congestion, nasal congestion, drainage in throat, wheezing, ear full, little SOB -denies: fevers, NVD, tooth pain, sinus pain -sick contacts: yes -has tried: musinex - she reports she tolerates azithromycin  ROS: See pertinent positives and negatives per HPI.  Past Medical History  Diagnosis Date  . B12 DEFICIENCY 11/25/2009  . ESSENTIAL HYPERTENSION 09/30/2009  . CHF 01/27/2009  . SMALL BOWEL OBSTRUCTION 05/05/2008  . DIVERTICULITIS OF COLON 01/27/2009  . OSTEOARTHRITIS, KNEE, RIGHT 02/08/2009  . OSTEOPENIA 01/27/2009  . Left ventricular dysfunction   . Hypertension   . History of echocardiogram 07/25/2009    EF 45 to 50% with mild to moderate MR and TR  . Hemorrhoids   . Rectal bleeding   . Atrial fib/flutter, transient     Family History  Problem Relation Age of Onset  . Heart disease    . Emphysema    . Heart disease Father   . Heart disease Sister     History   Social History  . Marital Status: Widowed    Spouse Name: N/A    Number of Children: N/A  . Years of Education: N/A   Social History Main Topics  . Smoking status: Never Smoker   . Smokeless tobacco: Never Used  . Alcohol Use: No  . Drug Use: No  . Sexually Active: None   Other Topics Concern  . None   Social History Narrative  . None    Current outpatient prescriptions:benzonatate (TESSALON) 200 MG capsule, Take 1 capsule (200 mg total) by mouth 3 (three) times daily as needed for cough., Disp: 20 capsule, Rfl: 0;  fluticasone (FLONASE) 50 MCG/ACT nasal spray, Place 2 sprays into the nose daily., Disp: 16 g, Rfl: 3;  furosemide (LASIX) 40 MG tablet, Decrease to 40 mg on Monday Wednesday and Friday only, none on other days., Disp: 30 tablet, Rfl: 5 hydrocortisone (ANUSOL-HC) 25 MG suppository, INSERT ONE SUPPOSITORY RECTALLY  TWICE DAILY, Disp: 24 suppository, Rfl: 11;  loratadine (CLARITIN) 10 MG tablet, Take 10 mg by mouth daily.  , Disp: , Rfl: ;  losartan (COZAAR) 50 MG tablet, TAKE ONE TABLET BY MOUTH EVERY DAY, Disp: 90 tablet, Rfl: 3;  metoprolol (LOPRESSOR) 50 MG tablet, TAKE ONE AND ONE-HALF TABLETS BY MOUTH TWICE DAILY, Disp: 90 tablet, Rfl: 5 Multiple Vitamin (MULTIVITAMIN) tablet, Take 1 tablet by mouth daily.  , Disp: , Rfl: ;  Multiple Vitamins-Minerals (ICAPS PO), Take 2 tablets by mouth daily. , Disp: , Rfl: ;  potassium chloride (K-DUR,KLOR-CON) 10 MEQ tablet, Take 10 meq on Monday Wednesday and Friday only with the lasix, Disp: 30 tablet, Rfl: 5;  PROCTOSOL HC 2.5 % rectal cream, PLACE RECTALLY TWICE DAILY., Disp: 29 g, Rfl: 5 triamcinolone cream (KENALOG) 0.1 %, Apply topically 2 (two) times daily., Disp: 30 g, Rfl: 3;  warfarin (COUMADIN) 5 MG tablet, Take 1-1.5 tablets (5-7.5 mg total) by mouth daily. Take as directed by Anticoagulation clinic.  Pt takes up to 1 1/2 tablets daily., Disp: 45 tablet, Rfl: 2 albuterol (PROVENTIL HFA;VENTOLIN HFA) 108 (90 BASE) MCG/ACT inhaler, Inhale 2 puffs into the lungs every 6 (six) hours as needed for wheezing., Disp: 1 Inhaler, Rfl: 0;  azithromycin (ZITHROMAX) 250 MG tablet, 2 tabs daily, then 1 tab daily for 4 days, Disp: 6 tablet, Rfl: 0 Current facility-administered medications:cyanocobalamin ((VITAMIN B-12)) injection  1,000 mcg, 1,000 mcg, Intramuscular, Once, [name], MD  EXAM:  Filed Vitals:   03/03/12 1042  BP: 110/80  Pulse: 95  Temp: 98.5 F (36.9 C)    Body mass index is 26.31 kg/(m^2).  GENERAL: vitals reviewed and listed above, alert, oriented, appears well hydrated and in no acute distress  HEENT: atraumatic, conjunttiva clear, no obvious abnormalities on inspection of external nose and ears, normal appearance of ear canals and TMs, clear nasal congestion, mild post oropharyngeal erythema with PND, no tonsillar edema or exudate, no sinus  TTP  NECK: no obvious masses on inspection  LUNGS: clear to auscultation bilaterally, no wheezes, rales or rhonchi, good air movement  CV: HRRR, no peripheral edema  MS: moves all extremities without noticeable abnormality  PSYCH: pleasant and cooperative, no obvious depression or anxiety  ASSESSMENT AND PLAN:  Discussed the following assessment and plan:  Acute upper respiratory infections of unspecified site - Plan: azithromycin (ZITHROMAX) 250 MG tablet  Post viral RAD (reactive airway disease) - Plan: albuterol (PROVENTIL HFA;VENTOLIN HFA) 108 (90 BASE) MCG/ACT inhaler  -likley viral, but given length of symptoms and not getting better zpak reasonable. Discussed risks including change in INR - notified padonda and has follow up. Return precautions. Also alb given for reported wheezing - query post viral RA - no wheezing on exam today. -Patient advised to return or notify a doctor immediately if symptoms worsen or persist or new concerns arise.  Patient Instructions  INSTRUCTIONS FOR UPPER RESPIRATORY INFECTION:  -plenty of rest and fluids  -As we discussed, we have prescribed a new medication (AZITHROMYCIN) for you at this appointment. We discussed the common and serious potential adverse effects of this medication and you can review these and more with the pharmacist when you pick up your medication.  Please follow the instructions for use carefully and notify us immediately if you have any problems taking this medication.  -nasal saline wash 2-3 times daily (use prepackaged nasal saline or bottled/distilled water if making your own)   -can use sinex nasal spray for drainage and nasal congestion - but do NOT use longer then 3-4 days  -can use tylenol or ibuprofen as directed for aches and sorethroat  -in the winter time, using a humidifier at night is helpful (please follow cleaning instructions)  -if you are taking a cough medication - use only as directed, may also try a  teaspoon of honey to coat the throat and throat lozenges  -for sore throat, salt water gargles can help  -follow up if you have fevers, facial pain, tooth pain, difficulty breathing or are worsening or not getting better in 5-7 days      KIM, HANNAH R.

## 2012-03-05 ENCOUNTER — Ambulatory Visit (INDEPENDENT_AMBULATORY_CARE_PROVIDER_SITE_OTHER): Payer: Medicare HMO | Admitting: Family Medicine

## 2012-03-05 DIAGNOSIS — E538 Deficiency of other specified B group vitamins: Secondary | ICD-10-CM

## 2012-03-05 MED ORDER — BENZONATATE 200 MG PO CAPS: 200.0000 mg | ORAL_CAPSULE | Freq: Three times a day (TID) | ORAL | Status: DC | PRN

## 2012-03-05 MED ORDER — CYANOCOBALAMIN 1000 MCG/ML IJ SOLN
1000.0000 ug | Freq: Once | INTRAMUSCULAR | Status: AC
  Administered 2012-03-05: 1000 ug via INTRAMUSCULAR

## 2012-03-06 ENCOUNTER — Other Ambulatory Visit: Payer: Self-pay | Admitting: Family Medicine

## 2012-03-06 ENCOUNTER — Telehealth: Payer: Self-pay | Admitting: Cardiovascular Disease

## 2012-03-06 NOTE — Telephone Encounter (Signed)
New Problem:    Patient called in because she is having issues with fluid filling her lungs and her ankles swelling.  Was recently seen by Dr. Lucie Leather office and was prescribed medication for her bronchitis. Please call back.

## 2012-03-06 NOTE — Telephone Encounter (Signed)
C/o of bronchitis symptoms, pt feels some symptoms may be chf, more ankle edema- pt is watching her sodium intake but not getting daily wts. Per Dr Elease Hashimoto she may take additional lasix and k+ for two days of usual 40 mg, pt told to take it Friday Saturday and Sunday, labs in 1 week/bmet, when pt was informed and I went to set lab date she is scheduled to see Wende Mott pa, she will have labs drawn then. Pt verbalized understanding.

## 2012-03-12 ENCOUNTER — Telehealth: Payer: Self-pay

## 2012-03-12 ENCOUNTER — Telehealth: Payer: Self-pay | Admitting: Cardiovascular Disease

## 2012-03-12 ENCOUNTER — Ambulatory Visit (INDEPENDENT_AMBULATORY_CARE_PROVIDER_SITE_OTHER): Payer: Medicare HMO | Admitting: Physician Assistant

## 2012-03-12 VITALS — BP 142/88 | HR 107 | Ht 66.5 in | Wt 163.0 lb

## 2012-03-12 DIAGNOSIS — I5021 Acute systolic (congestive) heart failure: Secondary | ICD-10-CM

## 2012-03-12 DIAGNOSIS — R079 Chest pain, unspecified: Secondary | ICD-10-CM

## 2012-03-12 DIAGNOSIS — R9439 Abnormal result of other cardiovascular function study: Secondary | ICD-10-CM

## 2012-03-12 DIAGNOSIS — I10 Essential (primary) hypertension: Secondary | ICD-10-CM

## 2012-03-12 DIAGNOSIS — I4891 Unspecified atrial fibrillation: Secondary | ICD-10-CM

## 2012-03-12 DIAGNOSIS — I5023 Acute on chronic systolic (congestive) heart failure: Secondary | ICD-10-CM

## 2012-03-12 LAB — BASIC METABOLIC PANEL
CO2: 27 mEq/L (ref 19–32)
Chloride: 102 mEq/L (ref 96–112)
Creatinine, Ser: 1 mg/dL (ref 0.4–1.2)
Potassium: 4.3 mEq/L (ref 3.5–5.1)
Sodium: 140 mEq/L (ref 135–145)

## 2012-03-12 LAB — CBC WITH DIFFERENTIAL/PLATELET
Basophils Relative: 0.7 % (ref 0.0–3.0)
Eosinophils Absolute: 0.3 10*3/uL (ref 0.0–0.7)
Eosinophils Relative: 4 % (ref 0.0–5.0)
HCT: 44.1 % (ref 36.0–46.0)
Hemoglobin: 14.5 g/dL (ref 12.0–15.0)
Lymphs Abs: 2.4 10*3/uL (ref 0.7–4.0)
MCHC: 32.9 g/dL (ref 30.0–36.0)
MCV: 96.8 fl (ref 78.0–100.0)
Monocytes Absolute: 1 10*3/uL (ref 0.1–1.0)
Neutro Abs: 3.8 10*3/uL (ref 1.4–7.7)
RBC: 4.56 Mil/uL (ref 3.87–5.11)

## 2012-03-12 MED ORDER — POTASSIUM CHLORIDE CRYS ER 10 MEQ PO TBCR: 20.0000 meq | EXTENDED_RELEASE_TABLET | Freq: Every day | ORAL | Status: DC

## 2012-03-12 MED ORDER — FUROSEMIDE 40 MG PO TABS: 40.0000 mg | ORAL_TABLET | ORAL | Status: DC

## 2012-03-12 MED ORDER — FUROSEMIDE 20 MG PO TABS: 20.0000 mg | ORAL_TABLET | ORAL | Status: DC

## 2012-03-12 NOTE — Progress Notes (Signed)
[address], [address] [place], [place]  [address] Phone: [phone], Fax:  [phone]  Date:  03/12/2012   ID:  Jung Ingerson, DOB [birth year], MRN [id number]  PCP:  [name], MD  Primary Cardiologist:  Dr. Delane Ginger     History of Present Illness: [name] is a 79 y.o. female who returns for follow up on CHF.  She has a hx of AFib, s/p failed DCCV 10/2010, HTN, systolic CHF, prior SBO 08/2010.  Myoview 5/12:  Low risk, no ischemia, apical lateral defect probably breast attenuation, inf HK, EF 45%.  Echo 8/12: EF 45-50%, inf/inf-septal and apical HK, mild LVH, mild to mod MR, mild to mod TR, PASP 50.  Last seen by Dr. Delane Ginger in 09/2011.  Recently seen by primary care for bronchitis and treated with antibiotics.  She called in and spoke to the RN last week and noted increased ankle edema along with bronchitis symptoms and she was told to take extra Lasix x 2 days and follow up today.  Patient notes increasing dyspnea with exertion over the last 2 months. She describes NYHA class III symptoms. She denies orthopnea, PND. She does note increased LE edema. She also notes chest heaviness with exertion. She denies any radiation to her arms or jaws. She denies any diaphoresis. Rest does make her symptoms better. She has multiple intolerances to most of her medications.  Labs (5/13): K 4.7, creatinine 0.98, Hgb 14.1  Wt Readings from Last 3 Encounters:  03/12/12 163 lb (73.936 kg)  03/03/12 168 lb (76.204 kg)  01/17/12 164 lb (74.39 kg)     Past Medical History  Diagnosis Date  . B12 DEFICIENCY 11/25/2009  . ESSENTIAL HYPERTENSION 09/30/2009  . CHF 01/27/2009  . SMALL BOWEL OBSTRUCTION 05/05/2008  . DIVERTICULITIS OF COLON 01/27/2009  . OSTEOARTHRITIS, KNEE, RIGHT 02/08/2009  . OSTEOPENIA 01/27/2009  . Left ventricular dysfunction   . Hypertension   . History of echocardiogram 07/25/2009    EF 45 to 50% with mild to moderate MR and TR  . Hemorrhoids   .  Rectal bleeding   . Atrial fib/flutter, transient     Current Outpatient Prescriptions  Medication Sig Dispense Refill  . albuterol (PROVENTIL HFA;VENTOLIN HFA) 108 (90 BASE) MCG/ACT inhaler Inhale 2 puffs into the lungs every 6 (six) hours as needed for wheezing.  1 Inhaler  0  . benzonatate (TESSALON) 200 MG capsule TAKE ONE CAPSULE BY MOUTH THREE TIMES DAILY AS NEEDED FOR COUGH.  20 capsule  0  . furosemide (LASIX) 40 MG tablet 40 mg. Take 40 mg five days a week.      . hydrocortisone (ANUSOL-HC) 25 MG suppository       . loratadine (CLARITIN) 10 MG tablet Take 10 mg by mouth daily.        Marland Kitchen losartan (COZAAR) 50 MG tablet TAKE ONE TABLET BY MOUTH EVERY DAY  90 tablet  3  . metoprolol (LOPRESSOR) 50 MG tablet TAKE ONE AND ONE-HALF TABLETS BY MOUTH TWICE DAILY  90 tablet  5  . Multiple Vitamin (MULTIVITAMIN) tablet Take 1 tablet by mouth daily.        . Multiple Vitamins-Minerals (ICAPS PO) Take 2 tablets by mouth daily.       . potassium chloride (K-DUR,KLOR-CON) 10 MEQ tablet Take 10 meq five days a week      . warfarin (COUMADIN) 5 MG tablet Take 1-1.5 tablets (5-7.5 mg total) by mouth daily. Take as directed by Anticoagulation clinic.  Pt takes up to 1 1/2 tablets daily.  45 tablet  2  . fluticasone (FLONASE) 50 MCG/ACT nasal spray Place 2 sprays into the nose daily.  16 g  3   Current Facility-Administered Medications  Medication Dose Route Frequency Victora Irby Last Rate Last Dose  . cyanocobalamin ((VITAMIN B-12)) injection 1,000 mcg  1,000 mcg Intramuscular Once [name], MD        Allergies:    Allergies  Allergen Reactions  . Bactrim   . Codeine   . Nitrofurantoin   . Nitrofurantoin Monohyd Macro   . Penicillins   . Sulfamethoxazole W-Trimethoprim     Social History:  The patient  reports that she has never smoked. She has never used smokeless tobacco. She reports that she does not drink alcohol or use illicit drugs.   ROS:  Please see the history of present  illness.   She notes a history of hemorrhoidal bleeding. She's had yellowish sputum with her recent cough.   All other systems reviewed and negative.   PHYSICAL EXAM: VS:  BP 142/88  Pulse 107  Ht 5' 6.5" (1.689 m)  Wt 163 lb (73.936 kg)  BMI 25.92 kg/m2 Well nourished, well developed, in no acute distress HEENT: normal Neck: + JVD Cardiac:  normal S1, S2; irregularly irregular rhythm; no murmur Lungs:  Bibasilar crackles, no wheezing or rhonchi Abd: soft, nontender, no hepatomegaly Ext: 1-2+ bilateral LE edema Skin: warm and dry Neuro:  CNs 2-12 intact, no focal abnormalities noted  EKG:  Atrial fibrillation, HR107, left axis deviation, nonspecific ST-T wave changes, no change from prior tracings     ASSESSMENT AND PLAN:  1. Acute on Chronic Systolic CHF:  She is volume overloaded. This likely explains all of her symptoms. I am also somewhat concerned about her chest discomfort. She does have evidence of prior scar on her last nuclear study as well as wall motion abnormalities on her echocardiogram. I will increase her Lasix to 40 mg twice a day for 2 days. She will then continue at Lasix 60 mg daily. Start potassium 20 mEq daily. Check a basic metabolic panel, CBC, TSH, BNP. Plan followup echocardiogram. Obtain chest x-ray. She will require close followup and should keep her appointment with Dr. Elease Hashimoto 3/14. 2. Chest Pain:  This may all be related to her volume overload. Proceed with diuresis as noted. If she has worsening ejection fraction or new wall motion abnormalities on echocardiogram, she may need cardiac catheterization. 3. Atrial Fibrillation:  Rate not controlled.  But she has not taken metoprolol yet today.  Rate usually controlled.  Continue current rx.  She remains on coumadin. 4. Disposition:  Follow up with Dr. Delane Ginger 03/21/12 as planned.  Luna Glasgow, PA-C  8:43 AM 03/12/2012

## 2012-03-12 NOTE — Patient Instructions (Addendum)
INCREASE LASIX TO 40 MG TWICE DAILY FOR 2 DAYS THEN CHANGE TO 60 MG DAILY INCREASE POTASSIUM TO 20 MEQ DAILY  LAB TODAY; BMET, CBC W/DIFF, TSH, BNP  REPEAT BMET 1 WEEK  Your physician has requested that you have an echocardiogram. Echocardiography is a painless test that uses sound waves to create images of your heart. It provides your doctor with information about the size and shape of your heart and how well your heart's chambers and valves are working. This procedure takes approximately one hour. There are no restrictions for this procedure.  A chest x-ray takes a picture of the organs and structures inside the chest, including the heart, lungs, and blood vessels. This test can show several things, including, whether the heart is enlarges; whether fluid is building up in the lungs; and whether pacemaker / defibrillator leads are still in place.   KEEP YOUR APPT WITH DR. Elease Hashimoto 3*14/14

## 2012-03-12 NOTE — Telephone Encounter (Signed)
Called and left a message for [name] that labs can be done.

## 2012-03-12 NOTE — Telephone Encounter (Signed)
Will forward to [employer]

## 2012-03-12 NOTE — Telephone Encounter (Signed)
[name] from  [employer] [facility] [facility] (Dr. Lourena Simmonds) called and wanted to know if pt could have a bmp drawn at her appt 3/12. Pt has transportation to this appt.  Pls advise and call [name] back to let know if this is possible.

## 2012-03-12 NOTE — Telephone Encounter (Signed)
New Prob   Needs clarification on directions for lasix.

## 2012-03-12 NOTE — Telephone Encounter (Signed)
Yes we can do this

## 2012-03-13 ENCOUNTER — Telehealth: Payer: Self-pay | Admitting: Family Medicine

## 2012-03-13 ENCOUNTER — Telehealth: Payer: Self-pay | Admitting: *Deleted

## 2012-03-13 NOTE — Telephone Encounter (Signed)
Advised patient of lab results  

## 2012-03-13 NOTE — Telephone Encounter (Signed)
Pt states she got her refill of hydrocortisone (ANUSOL-HC) 25 MG suppository.  Cost of $85.00. Pt would like to know if there is another generic brand she can try that is less expensive. Pharm: [facility]

## 2012-03-13 NOTE — Telephone Encounter (Signed)
Message copied by Burnell Blanks on Thu Mar 13, 2012  4:11 PM ------      Message from: [place], [place] T      Created: Wed Mar 12, 2012 10:09 PM       Hgb and TSH normal.      BNP high c/w volume overload      Potassium and kidney function look good.      Continue with current treatment plan.      Tereso Newcomer, PA-C  4:49 PM 11/22/2011 ------

## 2012-03-14 ENCOUNTER — Other Ambulatory Visit (HOSPITAL_COMMUNITY): Payer: Medicare HMO

## 2012-03-14 ENCOUNTER — Other Ambulatory Visit: Payer: Medicare HMO

## 2012-03-17 ENCOUNTER — Telehealth: Payer: Self-pay | Admitting: Family Medicine

## 2012-03-17 NOTE — Telephone Encounter (Signed)
i am not aware of less expensive equivalent.  Maybe pharmacy can advise if equivalent at less cost

## 2012-03-17 NOTE — Telephone Encounter (Signed)
I called [facility], spoke with pharmacist and learned proctosol cream would cost $26.00, and Hemril suppository, #12 would cost $64.00.  I called pt to inform, she already uses proctosol cream and Anusol HC which she has been getting #24, costing $83.00.  Jeanice Lim, is this a P/A opportunity?

## 2012-03-17 NOTE — Telephone Encounter (Signed)
I called the pharmacy again, found out the computer was pricing the [facility], so the generic is now $34.60.  Pt informed and she was happy

## 2012-03-17 NOTE — Telephone Encounter (Signed)
Patient calling about Hydrocortisone / Anusol HC 25mg  Supp for hemorrhoids.  Has 3 refills left but when went to pick up last refill, was told that insurance would not longer pay for the medication and the cost would be $83.00 so she did not get the suppositories.   Asking if there is a substitute that can be called in with less cost that she can use since if not using suppository will cause Sx of brown seepage with stool and when using suppository does not have this.   Please review, asking for less expensive replacement for Anusol HC Suppository, Uses [facility]. Please contact patient at [phone].

## 2012-03-17 NOTE — Telephone Encounter (Signed)
Maybe. The pharmacy has to get a rejection message that states the med NEEDS a prior auth before we would know.

## 2012-03-18 ENCOUNTER — Encounter: Payer: Medicare HMO | Admitting: Family

## 2012-03-19 ENCOUNTER — Ambulatory Visit (INDEPENDENT_AMBULATORY_CARE_PROVIDER_SITE_OTHER): Payer: Medicare HMO | Admitting: Family

## 2012-03-19 DIAGNOSIS — Z7901 Long term (current) use of anticoagulants: Secondary | ICD-10-CM

## 2012-03-19 DIAGNOSIS — I4891 Unspecified atrial fibrillation: Secondary | ICD-10-CM

## 2012-03-19 LAB — POCT INR: INR: 2.1

## 2012-03-19 NOTE — Patient Instructions (Addendum)
1 pill everyday except 1/2 pill on Fridays. Recheck in 6 weeks.   Anticoagulation Dose Instructions as of 03/19/2012     Glynis Smiles Tue Wed Thu Fri Sat   New Dose 5 mg 5 mg 5 mg 5 mg 5 mg 2.5 mg 5 mg    Description        1 pill everyday except 1/2 pill on Fridays. Recheck in 6 weeks.

## 2012-03-21 ENCOUNTER — Ambulatory Visit (INDEPENDENT_AMBULATORY_CARE_PROVIDER_SITE_OTHER): Payer: Medicare HMO | Admitting: Cardiovascular Disease

## 2012-03-21 ENCOUNTER — Other Ambulatory Visit: Payer: Self-pay | Admitting: *Deleted

## 2012-03-21 ENCOUNTER — Encounter: Payer: Self-pay | Admitting: Cardiovascular Disease

## 2012-03-21 VITALS — BP 140/90 | HR 96 | Ht 66.5 in | Wt 161.0 lb

## 2012-03-21 DIAGNOSIS — R0789 Other chest pain: Secondary | ICD-10-CM

## 2012-03-21 DIAGNOSIS — I509 Heart failure, unspecified: Secondary | ICD-10-CM

## 2012-03-21 DIAGNOSIS — I5022 Chronic systolic (congestive) heart failure: Secondary | ICD-10-CM

## 2012-03-21 MED ORDER — METOPROLOL TARTRATE 100 MG PO TABS: 100.0000 mg | ORAL_TABLET | Freq: Two times a day (BID) | ORAL | Status: DC

## 2012-03-21 NOTE — Progress Notes (Signed)
[name] Date of Birth  [birth year]       [facility]    [place] [address]  [address] [place], [place]  [address]   [place], [place]  [address] [phone]     [phone]   Fax  [phone]    Fax [phone]  Problem List: 1. Atrial fibrillation 2. Hypertension 3. Benign Positional Vertigo  History of Present Illness:  [name] is a 79 y.o. female with a history of atrial fibrillation. She was cardioverted in October, 2012. She unfortunately has gone back into atrial fibrillation.  She has felt very well. She's not having any episodes of chest pain or shortness breath. She's tolerating the rhythm quite well. Her only complaint is that she's itching from the patches were placed during the cardioversion. She also complains of having very dry and cracking skin on her hands.   Sept. 16, 2013 She has been exercising regularly.  She has gained weight since I last saw her ( 11 lbs.)  She has no complaints of dyspnea or chest pain.  March 21, 2012:  She was seen by Lorin Picket several weeks ago. She was scheduled have an echocardiogram but she has not had that yet.  ( was scheduled but we had a big snow storm).  She's been having more shortness breath and more leg edema.  Scar decreased her Lasix. Her leg swelling is better.  He also has some chest heaviness.  She is trying to limit her stair climbing.  Current Outpatient Prescriptions on File Prior to Visit  Medication Sig Dispense Refill  . albuterol (PROVENTIL HFA;VENTOLIN HFA) 108 (90 BASE) MCG/ACT inhaler Inhale 2 puffs into the lungs every 6 (six) hours as needed for wheezing.  1 Inhaler  0  . fluticasone (FLONASE) 50 MCG/ACT nasal spray Place 2 sprays into the nose daily.  16 g  3  . hydrocortisone (ANUSOL-HC) 25 MG suppository Place 25 mg rectally 2 (two) times daily as needed.       . loratadine (CLARITIN) 10 MG tablet Take 10 mg by mouth daily.        Marland Kitchen losartan (COZAAR) 50  MG tablet TAKE ONE TABLET BY MOUTH EVERY DAY  90 tablet  3  . metoprolol (LOPRESSOR) 50 MG tablet TAKE ONE AND ONE-HALF TABLETS BY MOUTH TWICE DAILY  90 tablet  5  . Multiple Vitamin (MULTIVITAMIN) tablet Take 1 tablet by mouth daily.        . Multiple Vitamins-Minerals (ICAPS PO) Take 2 tablets by mouth daily.       . potassium chloride (K-DUR,KLOR-CON) 10 MEQ tablet Take 2 tablets (20 mEq total) by mouth daily.  60 tablet  11  . warfarin (COUMADIN) 5 MG tablet Take 1-1.5 tablets (5-7.5 mg total) by mouth daily. Take as directed by Anticoagulation clinic.  Pt takes up to 1 1/2 tablets daily.  45 tablet  2  . benzonatate (TESSALON) 200 MG capsule TAKE ONE CAPSULE BY MOUTH THREE TIMES DAILY AS NEEDED FOR COUGH.  20 capsule  0   Current Facility-Administered Medications on File Prior to Visit  Medication Dose Route Frequency Provider Last Rate Last Dose  . cyanocobalamin ((VITAMIN B-12)) injection 1,000 mcg  1,000 mcg Intramuscular Once [name], MD        Allergies  Allergen Reactions  . Bactrim   . Codeine   . Nitrofurantoin   . Nitrofurantoin Monohyd Macro   . Penicillins   . Sulfamethoxazole W-Trimethoprim  Past Medical History  Diagnosis Date  . B12 DEFICIENCY 11/25/2009  . ESSENTIAL HYPERTENSION 09/30/2009  . CHF 01/27/2009  . SMALL BOWEL OBSTRUCTION 05/05/2008  . DIVERTICULITIS OF COLON 01/27/2009  . OSTEOARTHRITIS, KNEE, RIGHT 02/08/2009  . OSTEOPENIA 01/27/2009  . Left ventricular dysfunction   . Hypertension   . History of echocardiogram 07/25/2009    EF 45 to 50% with mild to moderate MR and TR  . Hemorrhoids   . Rectal bleeding   . Atrial fib/flutter, transient     Past Surgical History  Procedure Laterality Date  . Colon surgery  2006    Sigmoid colectomy for diverticulitis  . Abdominal hysterectomy  1996    TAHBSO  . Appendectomy  1951  . Tonsillectomy  1966    History  Smoking status  . Never Smoker   Smokeless tobacco  . Never Used     History  Alcohol Use No    Family History  Problem Relation Age of Onset  . Heart disease    . Emphysema    . Heart disease Father   . Heart disease Sister     Reviw of Systems:  Reviewed in the HPI.  All other systems are negative.  Physical Exam: Blood pressure 140/90, pulse 96, height 5' 6.5" (1.689 m), weight 161 lb (73.029 kg), SpO2 97.00%. She did not take any of her medications today.  General: Well developed, well nourished, in no acute distress.  Head: Normocephalic, atraumatic, sclera non-icteric, mucus membranes are moist,   Neck: Supple. Carotids are 2 + without bruits. No JVD  Lungs: Clear bilaterally to auscultation.  Heart: Irregularly irregular.  normal  S1 S2. No murmurs, gallops or rubs.  Abdomen: Soft, non-tender, non-distended with normal bowel sounds. No hepatomegaly. No rebound/guarding. No masses.  Msk:  Strength and tone are normal  Extremities: No clubbing or cyanosis. No edema.  Distal pedal pulses are 2+ and equal bilaterally.  Neuro: Alert and oriented X 3. Moves all extremities spontaneously.  Psych:  Responds to questions appropriately with a normal affect.  ECG:  Assessment / Plan:

## 2012-03-21 NOTE — Patient Instructions (Addendum)
Your physician has requested that you have a lexiscan myoview.  Please follow instruction sheet, as given.  Your physician has recommended you make the following change in your medication:   INCREASE METOPROLOL TO 100 MG TWICE DAILY 12 HOURS APART  Your physician recommends that you schedule a follow-up appointment in: 3 MONTHS WITH DR Elease Hashimoto

## 2012-03-21 NOTE — Assessment & Plan Note (Signed)
She remains dyspneac.  Will get an echo next week.   Her last echo in 2012 showed mildly reduced LV function.  We will continue with current lasix for now.  I will see her in several months.

## 2012-03-26 ENCOUNTER — Ambulatory Visit (HOSPITAL_COMMUNITY): Payer: Medicare HMO | Attending: Cardiology | Admitting: Radiology

## 2012-03-26 ENCOUNTER — Other Ambulatory Visit (INDEPENDENT_AMBULATORY_CARE_PROVIDER_SITE_OTHER): Payer: Medicare HMO

## 2012-03-26 DIAGNOSIS — R0989 Other specified symptoms and signs involving the circulatory and respiratory systems: Secondary | ICD-10-CM | POA: Insufficient documentation

## 2012-03-26 DIAGNOSIS — I079 Rheumatic tricuspid valve disease, unspecified: Secondary | ICD-10-CM | POA: Insufficient documentation

## 2012-03-26 DIAGNOSIS — I10 Essential (primary) hypertension: Secondary | ICD-10-CM | POA: Insufficient documentation

## 2012-03-26 DIAGNOSIS — I509 Heart failure, unspecified: Secondary | ICD-10-CM | POA: Insufficient documentation

## 2012-03-26 DIAGNOSIS — R072 Precordial pain: Secondary | ICD-10-CM | POA: Insufficient documentation

## 2012-03-26 DIAGNOSIS — I4891 Unspecified atrial fibrillation: Secondary | ICD-10-CM

## 2012-03-26 DIAGNOSIS — R0609 Other forms of dyspnea: Secondary | ICD-10-CM | POA: Insufficient documentation

## 2012-03-26 DIAGNOSIS — I059 Rheumatic mitral valve disease, unspecified: Secondary | ICD-10-CM | POA: Insufficient documentation

## 2012-03-26 LAB — HEPATIC FUNCTION PANEL
Bilirubin, Direct: 0.2 mg/dL (ref 0.0–0.3)
Total Bilirubin: 1 mg/dL (ref 0.3–1.2)

## 2012-03-26 LAB — LIPID PANEL
HDL: 52.3 mg/dL (ref >39.00)
LDL Cholesterol: 91 mg/dL (ref 0–99)
Total CHOL/HDL Ratio: 3
Triglycerides: 164 mg/dL — ABNORMAL HIGH (ref 0.0–149.0)

## 2012-03-26 LAB — BASIC METABOLIC PANEL
BUN: 21 mg/dL (ref 6–23)
Calcium: 8.9 mg/dL (ref 8.4–10.5)
Creatinine, Ser: 0.9 mg/dL (ref 0.4–1.2)
GFR: 60.38 mL/min (ref >60.00)

## 2012-03-26 NOTE — Progress Notes (Signed)
Echocardiogram performed.  

## 2012-03-27 ENCOUNTER — Encounter: Payer: Self-pay | Admitting: Physician Assistant

## 2012-03-28 ENCOUNTER — Other Ambulatory Visit: Payer: Self-pay | Admitting: *Deleted

## 2012-04-02 ENCOUNTER — Ambulatory Visit (HOSPITAL_COMMUNITY): Payer: Medicare HMO | Attending: Cardiology | Admitting: Radiology

## 2012-04-02 VITALS — BP 149/86 | HR 89 | Ht 66.5 in | Wt 157.0 lb

## 2012-04-02 DIAGNOSIS — R002 Palpitations: Secondary | ICD-10-CM | POA: Insufficient documentation

## 2012-04-02 DIAGNOSIS — R0789 Other chest pain: Secondary | ICD-10-CM | POA: Insufficient documentation

## 2012-04-02 DIAGNOSIS — Z8249 Family history of ischemic heart disease and other diseases of the circulatory system: Secondary | ICD-10-CM | POA: Insufficient documentation

## 2012-04-02 DIAGNOSIS — I10 Essential (primary) hypertension: Secondary | ICD-10-CM | POA: Insufficient documentation

## 2012-04-02 DIAGNOSIS — I4891 Unspecified atrial fibrillation: Secondary | ICD-10-CM

## 2012-04-02 DIAGNOSIS — R0989 Other specified symptoms and signs involving the circulatory and respiratory systems: Secondary | ICD-10-CM | POA: Insufficient documentation

## 2012-04-02 DIAGNOSIS — R0602 Shortness of breath: Secondary | ICD-10-CM

## 2012-04-02 DIAGNOSIS — R079 Chest pain, unspecified: Secondary | ICD-10-CM

## 2012-04-02 DIAGNOSIS — R0609 Other forms of dyspnea: Secondary | ICD-10-CM | POA: Insufficient documentation

## 2012-04-02 MED ORDER — REGADENOSON 0.4 MG/5ML IV SOLN
0.4000 mg | Freq: Once | INTRAVENOUS | Status: AC
  Administered 2012-04-02: 0.4 mg via INTRAVENOUS

## 2012-04-02 MED ORDER — TECHNETIUM TC 99M SESTAMIBI GENERIC - CARDIOLITE
30.0000 | Freq: Once | INTRAVENOUS | Status: AC | PRN
  Administered 2012-04-02: 30 via INTRAVENOUS

## 2012-04-02 MED ORDER — TECHNETIUM TC 99M SESTAMIBI GENERIC - CARDIOLITE
10.0000 | Freq: Once | INTRAVENOUS | Status: AC | PRN
  Administered 2012-04-02: 10 via INTRAVENOUS

## 2012-04-02 NOTE — Progress Notes (Signed)
[facility] SITE 3 NUCLEAR MED [address] [place], [place] [address] [phone]    Cardiology Nuclear Med Study  [name] is a 79 y.o. female     MRN : [id number]     DOB: 15-Oct-1926  Procedure Date: 04/02/2012  Nuclear Med Background Indication for Stress Test:  Evaluation for Ischemia History:  h/o atrial fib/flutter; 5/12 MPS:no ischemia, EF=45%; 10/12 Cardioversion; 03/26/12 Echo:EF=20%, mild-moderate MR/TR (8/12 Echo:EF=50%) Cardiac Risk Factors: Family History - CAD and Hypertension  Symptoms:  Chest Pressure with and without Exertion (last date of chest discomfort was about 2-days ago), DOE with Chest Pressure, Palpitations and Rapid HR   Nuclear Pre-Procedure Caffeine/Decaff Intake:  None > 12 hrs NPO After: 5:00pm   Lungs:  Clear. O2 Sat: 93% on room air. IV 0.9% NS with Angio Cath:  22g  IV Site: R Hand x 1, tolerated well IV Started by:  Irean Hong, RN  Chest Size (in):  36 Cup Size: A  Height: 5' 6.5" (1.689 m)  Weight:  157 lb (71.215 kg)  BMI:  Body mass index is 24.96 kg/(m^2). Tech Comments:  Held Metoprolol x 36 hrs    Nuclear Med Study 1 or 2 day study: 1 day  Stress Test Type:  Treadmill/Lexiscan  Reading MD: Willa Rough, MD  Order Authorizing Provider:  Kristeen Miss, MD  Resting Radionuclide: Technetium 59m Sestamibi  Resting Radionuclide Dose: 11.0 mCi   Stress Radionuclide:  Technetium 58m Sestamibi  Stress Radionuclide Dose: 32.6 mCi           Stress Protocol Rest HR: 89 Stress HR: 160  Rest BP: 149/86 Stress BP: 130/90  Exercise Time (min): 1:15 METS: n/a   Predicted Max HR: 137 bpm % Max HR: 116.79 bpm Rate Pressure Product: 69629   Dose of Adenosine (mg):  n/a Dose of Lexiscan: 0.4 mg  Dose of Atropine (mg): n/a Dose of Dobutamine: n/a mcg/kg/min (at max HR)  Stress Test Technologist: Smiley Houseman, CMA-N  Nuclear Technologist:  Doyne Keel, CNMT     Rest Procedure:  Myocardial perfusion imaging was performed  at rest 45 minutes following the intravenous administration of Technetium 72m Sestamibi.  Rest ECG: The resting EKG reveals atrial fibrillation. There are nonspecific ST-T wave changes.  Stress Procedure:  The patient received IV Lexiscan 0.4 mg over 15-seconds with concurrent low level exercise and then Technetium 9m Sestamibi was injected at 30-seconds while the patient continued walking.  She became very short of breath and her O2 Sat dropped to 85%.  Quantitative spect images were obtained after a 45-minute delay.  Stress ECG: No significant ST segment change suggestive of ischemia.  QPS Raw Data Images:  Patient motion noted; appropriate software correction applied. Stress Images:  There is a very small area of mild photon reduction at the apical cap. Rest Images:  Normal homogeneous uptake in all areas of the myocardium. Subtraction (SDS):  there is slight reversibility at the apical cap Transient Ischemic Dilatation (Normal <1.22):  1.02 Lung/Heart Ratio (Normal <0.45):  0.40  Quantitative Gated Spect Images QGS EDV:  n/a QGS ESV:  n/a  Impression Exercise Capacity:  Lexiscan with low level exercise. BP Response:  Normal blood pressure response. Clinical Symptoms:  shortness of breath ECG Impression:  No significant ST segment change suggestive of ischemia. Comparison with Prior Nuclear Study: No images to compare  Overall Impression:  Overall there is question of very slight ischemia at the apical cap. This is a borderline call. This is  a low-risk scan. There is no significant area of scar. There is increased uptake in the right ventricular wall which may suggest some right ventricular hypertrophy.  LV Ejection Fraction: Study not gated.  LV Wall Motion:  Study not gated.  Atrial fib did not allow the study to be gated. Tinnie Gens Katz,MD

## 2012-04-04 ENCOUNTER — Ambulatory Visit (INDEPENDENT_AMBULATORY_CARE_PROVIDER_SITE_OTHER): Payer: Medicare HMO | Admitting: Cardiovascular Disease

## 2012-04-04 ENCOUNTER — Encounter: Payer: Self-pay | Admitting: Cardiovascular Disease

## 2012-04-04 ENCOUNTER — Ambulatory Visit (INDEPENDENT_AMBULATORY_CARE_PROVIDER_SITE_OTHER): Payer: Medicare HMO

## 2012-04-04 ENCOUNTER — Telehealth: Payer: Self-pay | Admitting: Cardiovascular Disease

## 2012-04-04 VITALS — BP 108/80 | HR 55 | Ht 66.5 in | Wt 158.8 lb

## 2012-04-04 DIAGNOSIS — I509 Heart failure, unspecified: Secondary | ICD-10-CM

## 2012-04-04 DIAGNOSIS — E538 Deficiency of other specified B group vitamins: Secondary | ICD-10-CM

## 2012-04-04 DIAGNOSIS — I5022 Chronic systolic (congestive) heart failure: Secondary | ICD-10-CM

## 2012-04-04 MED ORDER — CYANOCOBALAMIN 1000 MCG/ML IJ SOLN
1000.0000 ug | Freq: Once | INTRAMUSCULAR | Status: AC
  Administered 2012-04-04: 1000 ug via INTRAMUSCULAR

## 2012-04-04 NOTE — Progress Notes (Signed)
[name] Date of Birth  Mar 16, 1926       [facility]    [place] [address], [address]  [address], [address] [place], [place]  [address]   [place], [place]  [address] [phone]     [phone]   Fax  [phone]    Fax [phone]  Problem List: 1. Atrial fibrillation 2. Hypertension 3. Benign Positional Vertigo  History of Present Illness:  [name] is a 79 y.o. female with a history of atrial fibrillation. She was cardioverted in October, 2012. She unfortunately has gone back into atrial fibrillation.  She has felt very well. She's not having any episodes of chest pain or shortness breath. She's tolerating the rhythm quite well. Her only complaint is that she's itching from the patches were placed during the cardioversion. She also complains of having very dry and cracking skin on her hands.   Sept. 16, 2013 She has been exercising regularly.  She has gained weight since I last saw her ( 11 lbs.)  She has no complaints of dyspnea or chest pain.  March 21, 2012:  She was seen by Lorin Picket several weeks ago. She was scheduled have an echocardiogram but she has not had that yet.  ( was scheduled but we had a big snow storm).  She's been having more shortness breath and more leg edema.  Scar decreased her Lasix. Her leg swelling is better.  He also has some chest heaviness.  She is trying to limit her stair climbing.  April 04, 2012:  Her recent echo shows markedly decreased LV function   Left ventricle: The cavity size was mildly dilated. Wall thickness was increased in a pattern of mild LVH. The estimated ejection fraction was 20%. Diffuse hypokinesis. - Mitral valve: Mild to moderate regurgitation. - Left atrium: The atrium was mildly dilated. - Right ventricle: Systolic function was mildly to moderately reduced. - Right atrium: The atrium was mildly dilated. - Tricuspid valve: Mild-moderate regurgitation. - Pulmonary arteries: PA peak  pressure: 41mm Hg (S).   her myoivew study revealed an apical defect - ? Apical thinning.  Low risk study.  She continues to have shortness breath. Not sure that she's having any chest pain.  It's very difficult to get an accurate history of her. She seems to ramble in her conversation.  Current Outpatient Prescriptions on File Prior to Visit  Medication Sig Dispense Refill  . albuterol (PROVENTIL HFA;VENTOLIN HFA) 108 (90 BASE) MCG/ACT inhaler Inhale 2 puffs into the lungs every 6 (six) hours as needed for wheezing.  1 Inhaler  0  . benzonatate (TESSALON) 200 MG capsule TAKE ONE CAPSULE BY MOUTH THREE TIMES DAILY AS NEEDED FOR COUGH.  20 capsule  0  . fluticasone (FLONASE) 50 MCG/ACT nasal spray Place 2 sprays into the nose daily.  16 g  3  . furosemide (LASIX) 20 MG tablet Take 60 mg by mouth daily.      . hydrocortisone (ANUSOL-HC) 25 MG suppository Place 25 mg rectally 2 (two) times daily as needed.       . loratadine (CLARITIN) 10 MG tablet Take 10 mg by mouth daily.        Marland Kitchen losartan (COZAAR) 50 MG tablet TAKE ONE TABLET BY MOUTH EVERY DAY  90 tablet  3  . metoprolol (LOPRESSOR) 100 MG tablet Take 1 tablet (100 mg total) by mouth 2 (two) times daily.  60 tablet  5  . Multiple Vitamin (MULTIVITAMIN) tablet Take 1 tablet  by mouth daily.        . Multiple Vitamins-Minerals (ICAPS PO) Take 2 tablets by mouth daily.       . potassium chloride (K-DUR,KLOR-CON) 10 MEQ tablet Take 2 tablets (20 mEq total) by mouth daily.  60 tablet  11  . PROCTOSOL HC 2.5 % rectal cream       . warfarin (COUMADIN) 5 MG tablet Take 1-1.5 tablets (5-7.5 mg total) by mouth daily. Take as directed by Anticoagulation clinic.  Pt takes up to 1 1/2 tablets daily.  45 tablet  2   Current Facility-Administered Medications on File Prior to Visit  Medication Dose Route Frequency Provider Last Rate Last Dose  . cyanocobalamin ((VITAMIN B-12)) injection 1,000 mcg  1,000 mcg Intramuscular Once [name], MD         Allergies  Allergen Reactions  . Bactrim   . Codeine   . Nitrofurantoin   . Nitrofurantoin Monohyd Macro   . Penicillins   . Sulfamethoxazole W-Trimethoprim     Past Medical History  Diagnosis Date  . B12 DEFICIENCY 11/25/2009  . ESSENTIAL HYPERTENSION 09/30/2009  . CHF 01/27/2009  . SMALL BOWEL OBSTRUCTION 05/05/2008  . DIVERTICULITIS OF COLON 01/27/2009  . OSTEOARTHRITIS, KNEE, RIGHT 02/08/2009  . OSTEOPENIA 01/27/2009  . Left ventricular dysfunction   . Hypertension   . Chronic systolic heart failure 07/25/2009    a. Myoview 5/12:  Low risk, no ischemia, apical lateral defect probably breast attenuation, inf HK, EF 45%.  b. Echo 8/12: EF 45-50%, inf/inf-septal and apical HK, mild LVH, mild to mod MR, mild to mod TR, PASP 50;  c.  Echo 3/14:  Mild LVH, EF 20%, diff HK, mild to mod MR, mild LAE, mild to mod reduced RVSF, mild RAE, mild to mod TR, PASP 41  . Hemorrhoids   . Rectal bleeding   . Atrial fib/flutter, transient     Past Surgical History  Procedure Laterality Date  . Colon surgery  2006    Sigmoid colectomy for diverticulitis  . Abdominal hysterectomy  1996    TAHBSO  . Appendectomy  1951  . Tonsillectomy  1966    History  Smoking status  . Never Smoker   Smokeless tobacco  . Never Used    History  Alcohol Use No    Family History  Problem Relation Age of Onset  . Heart disease    . Emphysema    . Heart disease Father   . Heart disease Sister     Reviw of Systems:  Reviewed in the HPI.  All other systems are negative.  Physical Exam: Blood pressure 108/80, pulse 55, height 5' 6.5" (1.689 m), weight 158 lb 12.8 oz (72.031 kg), SpO2 96.00%. She did not take any of her medications today.  General: Well developed, well nourished, in no acute distress.  Head: Normocephalic, atraumatic, sclera non-icteric, mucus membranes are moist,   Neck: Supple. Carotids are 2 + without bruits. No JVD  Lungs: Clear bilaterally to auscultation.  Heart:  Irregularly irregular.  normal  S1 S2. No murmurs, gallops or rubs.  Abdomen: Soft, non-tender, non-distended with normal bowel sounds. No hepatomegaly. No rebound/guarding. No masses.  Msk:  Strength and tone are normal  Extremities: No clubbing or cyanosis. No edema.  Distal pedal pulses are 2+ and equal bilaterally.  Neuro: Alert and oriented X 3. Moves all extremities spontaneously.  Psych:  Responds to questions appropriately with a normal affect.  ECG:  Assessment / Plan:

## 2012-04-04 NOTE — Telephone Encounter (Signed)
New problem    pts son would like to understand the results of her appt today-pt wasn't very clear on what was going to happen moving forward

## 2012-04-04 NOTE — Assessment & Plan Note (Addendum)
I've recommended that we do a right and left heart catheterization for further evaluation of her congestive heart failure. She also had an apical defect on Myoview study. There is a chance that this was just due to apical thinning and not to abnormality.  She was unable to able to come up with a date that she could have the cath. . She will check with her family will call us back.  It is very difficult to get an accurate history from her.  I dont think she is having angina but she does have evidence of worsened LV function.

## 2012-04-04 NOTE — Patient Instructions (Addendum)
Your physician wants you to follow-up in: 3 months You will receive a reminder letter in the mail two months in advance. If you don't receive a letter, please call our office to schedule the follow-up appointment.  Your physician recommends that you continue on your current medications as directed. Please refer to the Current Medication list given to you today.  CALL ME AND LET ME KNOW WHAT DAY WE NEED TO SCHEDULE YOUR HEART CATH. [phone], JODETTE RN

## 2012-04-04 NOTE — Telephone Encounter (Signed)
Family given results of echo/ stress test and need to have LHC/RHC for CHF and apical defect. They will call back to give date they can be with her for procedure. Told her son that she needs to avoid salt and exercise at this time till procedure done and answers given. He agreed to plan.

## 2012-04-09 ENCOUNTER — Encounter: Payer: Self-pay | Admitting: *Deleted

## 2012-04-09 NOTE — Telephone Encounter (Signed)
New problem    Schedule heart cath on 4/7 . Please return call back

## 2012-04-09 NOTE — Telephone Encounter (Signed)
Left and Right Heart Cath scheduled see letter/ coumadin stopped today per Dr. Elease Hashimoto.

## 2012-04-10 ENCOUNTER — Other Ambulatory Visit: Payer: Self-pay | Admitting: Cardiovascular Disease

## 2012-04-10 ENCOUNTER — Other Ambulatory Visit (INDEPENDENT_AMBULATORY_CARE_PROVIDER_SITE_OTHER): Payer: Medicare HMO

## 2012-04-10 DIAGNOSIS — I5021 Acute systolic (congestive) heart failure: Secondary | ICD-10-CM

## 2012-04-10 DIAGNOSIS — R9439 Abnormal result of other cardiovascular function study: Secondary | ICD-10-CM

## 2012-04-10 DIAGNOSIS — I509 Heart failure, unspecified: Secondary | ICD-10-CM

## 2012-04-10 LAB — BASIC METABOLIC PANEL
CO2: 33 mEq/L — ABNORMAL HIGH (ref 19–32)
Calcium: 8.6 mg/dL (ref 8.4–10.5)
Sodium: 136 mEq/L (ref 135–145)

## 2012-04-10 LAB — CBC
MCV: 95 fl (ref 78.0–100.0)
Platelets: 125 10*3/uL — ABNORMAL LOW (ref 150.0–400.0)
RBC: 4.44 Mil/uL (ref 3.87–5.11)
WBC: 5.7 10*3/uL (ref 4.5–10.5)

## 2012-04-10 LAB — PROTIME-INR
INR: 2.3 ratio — ABNORMAL HIGH (ref 0.8–1.0)
Prothrombin Time: 23.6 s — ABNORMAL HIGH (ref 10.2–12.4)

## 2012-04-14 ENCOUNTER — Encounter (HOSPITAL_BASED_OUTPATIENT_CLINIC_OR_DEPARTMENT_OTHER)
Admission: RE | Disposition: A | Discharge status: Home / Self Care | Payer: Self-pay | Source: Ambulatory Visit | Attending: Cardiology

## 2012-04-14 ENCOUNTER — Ambulatory Visit (INDEPENDENT_AMBULATORY_CARE_PROVIDER_SITE_OTHER): Payer: Medicare HMO

## 2012-04-14 ENCOUNTER — Inpatient Hospital Stay (HOSPITAL_BASED_OUTPATIENT_CLINIC_OR_DEPARTMENT_OTHER)
Admission: RE | Admit: 2012-04-14 | Discharge: 2012-04-14 | Disposition: A | Discharge status: Home / Self Care | Payer: Medicare HMO | Source: Ambulatory Visit | Attending: Cardiology | Admitting: Cardiology

## 2012-04-14 DIAGNOSIS — Z7901 Long term (current) use of anticoagulants: Secondary | ICD-10-CM

## 2012-04-14 DIAGNOSIS — I359 Nonrheumatic aortic valve disorder, unspecified: Secondary | ICD-10-CM | POA: Insufficient documentation

## 2012-04-14 DIAGNOSIS — R0609 Other forms of dyspnea: Secondary | ICD-10-CM | POA: Insufficient documentation

## 2012-04-14 DIAGNOSIS — I2510 Atherosclerotic heart disease of native coronary artery without angina pectoris: Secondary | ICD-10-CM

## 2012-04-14 DIAGNOSIS — R0989 Other specified symptoms and signs involving the circulatory and respiratory systems: Secondary | ICD-10-CM | POA: Insufficient documentation

## 2012-04-14 DIAGNOSIS — I4891 Unspecified atrial fibrillation: Secondary | ICD-10-CM

## 2012-04-14 DIAGNOSIS — I509 Heart failure, unspecified: Secondary | ICD-10-CM | POA: Insufficient documentation

## 2012-04-14 HISTORY — PX: CARDIAC CATHETERIZATION: SHX172

## 2012-04-14 LAB — POCT I-STAT 3, VENOUS BLOOD GAS (G3P V)
Bicarbonate: 28.7 mEq/L — ABNORMAL HIGH (ref 20.0–24.0)
TCO2: 30 mmol/L (ref 0–100)
pCO2, Ven: 50.5 mmHg — ABNORMAL HIGH (ref 45.0–50.0)
pH, Ven: 7.362 — ABNORMAL HIGH (ref 7.250–7.300)

## 2012-04-14 LAB — POCT I-STAT 3, ART BLOOD GAS (G3+)
TCO2: 29 mmol/L (ref 0–100)
pCO2 arterial: 44.7 mmHg (ref 35.0–45.0)
pH, Arterial: 7.402 (ref 7.350–7.450)

## 2012-04-14 SURGERY — JV LEFT AND RIGHT HEART CATHETERIZATION WITH CORONARY ANGIOGRAM
Anesthesia: Moderate Sedation

## 2012-04-14 MED ORDER — ASPIRIN 81 MG PO CHEW: 324.0000 mg | CHEWABLE_TABLET | ORAL | Status: DC

## 2012-04-14 MED ORDER — SODIUM CHLORIDE 0.9 % IV SOLN: 1.0000 mL/kg/h | INTRAVENOUS | Status: DC

## 2012-04-14 MED ORDER — ONDANSETRON HCL 4 MG/2ML IJ SOLN: 4.0000 mg | Freq: Four times a day (QID) | INTRAMUSCULAR | Status: DC | PRN

## 2012-04-14 MED ORDER — SODIUM CHLORIDE 0.9 % IV SOLN: INTRAVENOUS | Status: DC

## 2012-04-14 MED ORDER — ACETAMINOPHEN 325 MG PO TABS: 650.0000 mg | ORAL_TABLET | ORAL | Status: DC | PRN

## 2012-04-14 MED ORDER — SODIUM CHLORIDE 0.9 % IV SOLN: 250.0000 mL | INTRAVENOUS | Status: DC | PRN

## 2012-04-14 MED ORDER — SODIUM CHLORIDE 0.9 % IJ SOLN: 3.0000 mL | INTRAMUSCULAR | Status: DC | PRN

## 2012-04-14 MED ORDER — SODIUM CHLORIDE 0.9 % IJ SOLN: 3.0000 mL | Freq: Two times a day (BID) | INTRAMUSCULAR | Status: DC

## 2012-04-14 NOTE — OR Nursing (Signed)
Dr Jordan at bedside to discuss results and treatment plan with pt and family 

## 2012-04-14 NOTE — OR Nursing (Signed)
Meal served 

## 2012-04-14 NOTE — OR Nursing (Signed)
Discharge instructions reviewed and signed, pt stated understanding, ambulated in hall without difficluty, site level 0, transported to son's car via wheelchair

## 2012-04-14 NOTE — OR Nursing (Signed)
Pt refused Aspirin, stated intolerance

## 2012-04-14 NOTE — Interval H&P Note (Signed)
History and Physical Interval Note:  04/14/2012 9:42 AM  [name]  has presented today for surgery, with the diagnosis of aortic stenosis  The various methods of treatment have been discussed with the patient and family. After consideration of risks, benefits and other options for treatment, the patient has consented to  Procedure(s): JV LEFT AND RIGHT HEART CATHETERIZATION WITH CORONARY ANGIOGRAM (N/A) as a surgical intervention .  The patient's history has been reviewed, patient examined, no change in status, stable for surgery.  I have reviewed the patient's chart and labs.  Questions were answered to the patient's satisfaction.     Theron Arista [facility] 04/14/2012 9:42 AM

## 2012-04-14 NOTE — H&P (View-Only) (Signed)
  [name] Date of Birth  02/28/1926       [place] [facility]    [place] [facility] [address], [address]  [address], [address] [place], [name]  [address]   [name], [name]  [address] [phone]     [phone]   Fax  [phone]    Fax [phone]  Problem List: 1. Atrial fibrillation 2. Hypertension 3. Benign Positional Vertigo  History of Present Illness:  [name] is a 79 y.o. female with a history of atrial fibrillation. She was cardioverted in October, 2012. She unfortunately has gone back into atrial fibrillation.  She has felt very well. She's not having any episodes of chest pain or shortness breath. She's tolerating the rhythm quite well. Her only complaint is that she's itching from the patches were placed during the cardioversion. She also complains of having very dry and cracking skin on her hands.   Sept. 16, 2013 She has been exercising regularly.  She has gained weight since I last saw her ( 11 lbs.)  She has no complaints of dyspnea or chest pain.  March 21, 2012:  She was seen by [name] several weeks ago. She was scheduled have an echocardiogram but she has not had that yet.  ( was scheduled but we had a big snow storm).  She's been having more shortness breath and more leg edema.  Scar decreased her Lasix. Her leg swelling is better.  He also has some chest heaviness.  She is trying to limit her stair climbing.  April 04, 2012:  Her recent echo shows markedly decreased LV function   Left ventricle: The cavity size was mildly dilated. Wall thickness was increased in a pattern of mild LVH. The estimated ejection fraction was 20%. Diffuse hypokinesis. - Mitral valve: Mild to moderate regurgitation. - Left atrium: The atrium was mildly dilated. - Right ventricle: Systolic function was mildly to moderately reduced. - Right atrium: The atrium was mildly dilated. - Tricuspid valve: Mild-moderate regurgitation. - Pulmonary arteries: PA peak  pressure: 41mm Hg (S).   her myoivew study revealed an apical defect - ? Apical thinning.  Low risk study.  She continues to have shortness breath. Not sure that she's having any chest pain.  It's very difficult to get an accurate history of her. She seems to ramble in her conversation.  Current Outpatient Prescriptions on File Prior to Visit  Medication Sig Dispense Refill  . albuterol (PROVENTIL HFA;VENTOLIN HFA) 108 (90 BASE) MCG/ACT inhaler Inhale 2 puffs into the lungs every 6 (six) hours as needed for wheezing.  1 Inhaler  0  . benzonatate (TESSALON) 200 MG capsule TAKE ONE CAPSULE BY MOUTH THREE TIMES DAILY AS NEEDED FOR COUGH.  20 capsule  0  . fluticasone (FLONASE) 50 MCG/ACT nasal spray Place 2 sprays into the nose daily.  16 g  3  . furosemide (LASIX) 20 MG tablet Take 60 mg by mouth daily.      . hydrocortisone (ANUSOL-HC) 25 MG suppository Place 25 mg rectally 2 (two) times daily as needed.       . loratadine (CLARITIN) 10 MG tablet Take 10 mg by mouth daily.        . losartan (COZAAR) 50 MG tablet TAKE ONE TABLET BY MOUTH EVERY DAY  90 tablet  3  . metoprolol (LOPRESSOR) 100 MG tablet Take 1 tablet (100 mg total) by mouth 2 (two) times daily.  60 tablet  5  . Multiple Vitamin (MULTIVITAMIN) tablet Take 1 tablet   by mouth daily.        . Multiple Vitamins-Minerals (ICAPS PO) Take 2 tablets by mouth daily.       . potassium chloride (K-DUR,KLOR-CON) 10 MEQ tablet Take 2 tablets (20 mEq total) by mouth daily.  60 tablet  11  . PROCTOSOL HC 2.5 % rectal cream       . warfarin (COUMADIN) 5 MG tablet Take 1-1.5 tablets (5-7.5 mg total) by mouth daily. Take as directed by Anticoagulation clinic.  Pt takes up to 1 1/2 tablets daily.  45 tablet  2   Current Facility-Administered Medications on File Prior to Visit  Medication Dose Route Frequency Provider Last Rate Last Dose  . cyanocobalamin ((VITAMIN B-12)) injection 1,000 mcg  1,000 mcg Intramuscular Once [name] W Burchette, MD         Allergies  Allergen Reactions  . Bactrim   . Codeine   . Nitrofurantoin   . Nitrofurantoin Monohyd Macro   . Penicillins   . Sulfamethoxazole W-Trimethoprim     Past Medical History  Diagnosis Date  . B12 DEFICIENCY 11/25/2009  . ESSENTIAL HYPERTENSION 09/30/2009  . CHF 01/27/2009  . SMALL BOWEL OBSTRUCTION 05/05/2008  . DIVERTICULITIS OF COLON 01/27/2009  . OSTEOARTHRITIS, KNEE, RIGHT 02/08/2009  . OSTEOPENIA 01/27/2009  . Left ventricular dysfunction   . Hypertension   . Chronic systolic heart failure 07/25/2009    a. Myoview 5/12:  Low risk, no ischemia, apical lateral defect probably breast attenuation, inf HK, EF 45%.  b. Echo 8/12: EF 45-50%, inf/inf-septal and apical HK, mild LVH, mild to mod MR, mild to mod TR, PASP 50;  c.  Echo 3/14:  Mild LVH, EF 20%, diff HK, mild to mod MR, mild LAE, mild to mod reduced RVSF, mild RAE, mild to mod TR, PASP 41  . Hemorrhoids   . Rectal bleeding   . Atrial fib/flutter, transient     Past Surgical History  Procedure Laterality Date  . Colon surgery  2006    Sigmoid colectomy for diverticulitis  . Abdominal hysterectomy  1996    TAHBSO  . Appendectomy  1951  . Tonsillectomy  1966    History  Smoking status  . Never Smoker   Smokeless tobacco  . Never Used    History  Alcohol Use No    Family History  Problem Relation Age of Onset  . Heart disease    . Emphysema    . Heart disease Father   . Heart disease Sister     Reviw of Systems:  Reviewed in the HPI.  All other systems are negative.  Physical Exam: Blood pressure 108/80, pulse 55, height 5' 6.5" (1.689 m), weight 158 lb 12.8 oz (72.031 kg), SpO2 96.00%. She did not take any of her medications today.  General: Well developed, well nourished, in no acute distress.  Head: Normocephalic, atraumatic, sclera non-icteric, mucus membranes are moist,   Neck: Supple. Carotids are 2 + without bruits. No JVD  Lungs: Clear bilaterally to auscultation.  Heart:  Irregularly irregular.  normal  S1 S2. No murmurs, gallops or rubs.  Abdomen: Soft, non-tender, non-distended with normal bowel sounds. No hepatomegaly. No rebound/guarding. No masses.  Msk:  Strength and tone are normal  Extremities: No clubbing or cyanosis. No edema.  Distal pedal pulses are 2+ and equal bilaterally.  Neuro: Alert and oriented X 3. Moves all extremities spontaneously.  Psych:  Responds to questions appropriately with a normal affect.  ECG:  Assessment / Plan:   

## 2012-04-14 NOTE — OR Nursing (Signed)
Tegaderm dressing applied, site level 0, bedrest begins at 1040 

## 2012-04-14 NOTE — CV Procedure (Signed)
   Cardiac Catheterization Procedure Note  Name: [name] MRN: [id number] DOB: Dec 13, 1926  Procedure: Right Heart Cath, Left Heart Cath, Selective Coronary Angiography, LV angiography  Indication: 79 yo WF with symptoms of dyspnea. Echo shows marked reduction in EF of 20-25%. She has a history of atrial fibrillation.  Procedural Details: The right groin was prepped, draped, and anesthetized with 1% lidocaine. Using the modified Seldinger technique a 4French sheath was placed in the right femoral artery and a 7 French sheath was placed in the right femoral vein. A Swan-Ganz catheter was used for the right heart catheterization. Standard protocol was followed for recording of right heart pressures and sampling of oxygen saturations. Fick cardiac output was calculated. Standard Judkins catheters were used for selective coronary angiography and left ventriculography. There were no immediate procedural complications. The patient was transferred to the post catheterization recovery area for further monitoring.  Procedural Findings: Hemodynamics RA 4/4 mean of 3 mm Hg RV 36/4 mm Hg PA 38/16 mean 24 mm Hg PCWP 11/11 mean 8 mm Hg LV 117/10 mm Hg AO 116/63 mean 86 mm Hg  Oxygen saturations: PA 65% AO 92%  Cardiac Output (Fick) 4.4 L/min: Thermo- 3.8 L/min  Cardiac Index (Fick) 2.4 L/min/m2: Thermo-2.1 L/min/m2   Coronary angiography: Coronary dominance: co- dominant  Left mainstem: Normal  Left anterior descending (LAD): 30-40% mid vessel. First diagonal is normal.  Left circumflex (LCx): Large codominant vessel. Focal 40-50% after OM2.   Right coronary artery (RCA): 30% mid vessel.  Left ventriculography: Left ventricular systolic function is abnormal. There is inferior wall akinesis with global hypokinesis. Overall EF estimated at 30%.  Final Conclusions:   1. Nonobstructive coronary artery disease. 2. Severe LV dysfunction. 3. Normal right heart  pressures.  Recommendations: Continue medical management.   Theron Arista [facility] 04/14/2012, 10:19 AM

## 2012-04-21 ENCOUNTER — Ambulatory Visit (INDEPENDENT_AMBULATORY_CARE_PROVIDER_SITE_OTHER): Payer: Medicare HMO | Admitting: Family

## 2012-04-21 DIAGNOSIS — Z7901 Long term (current) use of anticoagulants: Secondary | ICD-10-CM

## 2012-04-21 DIAGNOSIS — I4891 Unspecified atrial fibrillation: Secondary | ICD-10-CM

## 2012-04-21 NOTE — Patient Instructions (Signed)
Take an extra 1/2 tab today and tomorrow.  Resume same dosage 1 pill everyday except 1/2 pill on Fridays. Recheck in 2 week.   Anticoagulation Dose Instructions as of 04/21/2012     Glynis Smiles Tue Wed Thu Fri Sat   New Dose 5 mg 5 mg 5 mg 5 mg 5 mg 2.5 mg 5 mg    Description       Take an extra 1/2 tab today and tomorrow.  Resume same dosage 1 pill everyday except 1/2 pill on Fridays. Recheck in 2 week.

## 2012-04-25 ENCOUNTER — Encounter: Payer: Medicare HMO | Admitting: Nurse Practitioner

## 2012-04-28 ENCOUNTER — Ambulatory Visit (INDEPENDENT_AMBULATORY_CARE_PROVIDER_SITE_OTHER): Payer: Medicare HMO | Admitting: Nurse Practitioner

## 2012-04-28 ENCOUNTER — Encounter: Payer: Self-pay | Admitting: Nurse Practitioner

## 2012-04-28 VITALS — BP 114/74 | HR 60 | Ht 66.0 in | Wt 156.8 lb

## 2012-04-28 DIAGNOSIS — Z9889 Other specified postprocedural states: Secondary | ICD-10-CM

## 2012-04-28 NOTE — Progress Notes (Signed)
[name] Date of Birth: [birth year] Medical Record #[id number]  History of Present Illness: [name] is seen back today for a post cath visit. She is seen for Dr. Elease Hashimoto. She has atrial fib, severe LV dysfunction - most recent echo with EF down to 20 to 25%. Other issues include HTN, atrial fib and vertigo. She is on chronic coumadin.   She was referred for repeat cath due to the lower EF. This showed nonobstructive CAD with severe LV dysfunction. She had normal right heart pressures. She will be managed medically.   She comes in today. She is here alone. Doing ok. Some shortness of breath but seems stable. Says she stays dizzy most of the time. No falls. No chest pain. Overall, she feels like she is doing ok. Tolerating her medicines. No problems with her groin. No scales at home. Does try to watch her salt.    Current Outpatient Prescriptions on File Prior to Visit  Medication Sig Dispense Refill  . furosemide (LASIX) 20 MG tablet Take 60 mg by mouth daily.      . hydrocortisone (ANUSOL-HC) 25 MG suppository Place 25 mg rectally 2 (two) times daily as needed.       . loratadine (CLARITIN) 10 MG tablet Take 10 mg by mouth daily.        Marland Kitchen losartan (COZAAR) 50 MG tablet TAKE ONE TABLET BY MOUTH EVERY DAY  90 tablet  3  . metoprolol (LOPRESSOR) 100 MG tablet Take 1 tablet (100 mg total) by mouth 2 (two) times daily.  60 tablet  5  . Multiple Vitamin (MULTIVITAMIN) tablet Take 1 tablet by mouth daily.        . Multiple Vitamins-Minerals (ICAPS PO) Take 2 tablets by mouth daily.       . potassium chloride (K-DUR,KLOR-CON) 10 MEQ tablet Take 2 tablets (20 mEq total) by mouth daily.  60 tablet  11  . PROCTOSOL HC 2.5 % rectal cream Place 1 application rectally daily.       Marland Kitchen warfarin (COUMADIN) 5 MG tablet Take 1-1.5 tablets (5-7.5 mg total) by mouth daily. Take as directed by Anticoagulation clinic.  Pt takes up to 1 1/2 tablets daily.  45 tablet  2   Current Facility-Administered  Medications on File Prior to Visit  Medication Dose Route Frequency Provider Last Rate Last Dose  . cyanocobalamin ((VITAMIN B-12)) injection 1,000 mcg  1,000 mcg Intramuscular Once [name], MD        Allergies  Allergen Reactions  . Bactrim   . Codeine   . Nitrofurantoin   . Nitrofurantoin Monohyd Macro   . Penicillins   . Sulfamethoxazole W-Trimethoprim     Past Medical History  Diagnosis Date  . B12 DEFICIENCY 11/25/2009  . ESSENTIAL HYPERTENSION 09/30/2009  . CHF 01/27/2009  . SMALL BOWEL OBSTRUCTION 05/05/2008  . DIVERTICULITIS OF COLON 01/27/2009  . OSTEOARTHRITIS, KNEE, RIGHT 02/08/2009  . OSTEOPENIA 01/27/2009  . Left ventricular dysfunction   . Hypertension   . Chronic systolic heart failure 07/25/2009    a. Myoview 5/12:  Low risk, no ischemia, apical lateral defect probably breast attenuation, inf HK, EF 45%.  b. Echo 8/12: EF 45-50%, inf/inf-septal and apical HK, mild LVH, mild to mod MR, mild to mod TR, PASP 50;  c.  Echo 3/14:  Mild LVH, EF 20%, diff HK, mild to mod MR, mild LAE, mild to mod reduced RVSF, mild RAE, mild to mod TR, PASP 41  . Hemorrhoids   . Rectal  bleeding   . Atrial fib/flutter, transient     Past Surgical History  Procedure Laterality Date  . Colon surgery  2006    Sigmoid colectomy for diverticulitis  . Abdominal hysterectomy  1996    TAHBSO  . Appendectomy  1951  . Tonsillectomy  1966  . Cardiac catheterization  April 14, 2012    nonobstructive CAD; severe LV dysfunction.     History  Smoking status  . Never Smoker   Smokeless tobacco  . Never Used    History  Alcohol Use No    Family History  Problem Relation Age of Onset  . Heart disease    . Emphysema    . Heart disease Father   . Heart disease Sister     Review of Systems: The review of systems is per the HPI.  All other systems were reviewed and are negative.  Physical Exam: BP 114/74  Pulse 60  Ht 5\' 6"  (1.676 m)  Wt 156 lb 12.8 oz (71.124 kg)  BMI 25.32  kg/m2 Patient is very pleasant and in no acute distress. Skin is warm and dry. Color is normal.  HEENT is unremarkable. Normocephalic/atraumatic. PERRL. Sclera are nonicteric. Neck is supple. No masses. No JVD. Lungs are clear. Cardiac exam shows an irregular rhythm. Rate is ok.  Abdomen is soft. Extremities are without edema. Gait and ROM are intact. No gross neurologic deficits noted.  LABORATORY DATA: Lab Results  Component Value Date   WBC 5.7 04/10/2012   HGB 14.1 04/10/2012   HCT 42.2 04/10/2012   PLT 125.0* 04/10/2012   GLUCOSE 79 04/10/2012   CHOL 176 03/26/2012   TRIG 164.0* 03/26/2012   HDL 52.30 03/26/2012   LDLCALC 91 03/26/2012   ALT 25 03/26/2012   AST 26 03/26/2012   NA 136 04/10/2012   K 3.5 04/10/2012   CL 98 04/10/2012   CREATININE 1.0 04/10/2012   BUN 24* 04/10/2012   CO2 33* 04/10/2012   TSH 3.90 03/12/2012   INR 1.5 04/21/2012   HGBA1C  Value: 5.6 (NOTE)   The ADA recommends the following therapeutic goal for glycemic   control related to Hgb A1C measurement:   Goal of Therapy:   < 7.0% Hgb A1C   Reference: American Diabetes Association: Clinical Practice   Recommendations 2008, Diabetes Care,  2008, 31:(Suppl 1). 03/19/2008   Coronary angiography:   Left mainstem: Normal  Left anterior descending (LAD): 30-40% mid vessel. First diagonal is normal.  Left circumflex (LCx): Large codominant vessel. Focal 40-50% after OM2.  Right coronary artery (RCA): 30% mid vessel.   Left ventriculography: Left ventricular systolic function is abnormal. There is inferior wall akinesis with global hypokinesis. Overall EF estimated at 30%.   Final Conclusions:  1. Nonobstructive coronary artery disease.  2. Severe LV dysfunction.  3. Normal right heart pressures.  Recommendations: Continue medical management.   Theron Arista [facility]  04/14/2012, 10:19 AM   Assessment / Plan: 1. Severe LV systolic dysfunction - to be managed medically. Nonobstructive disease per cath noted. She already endorses dizziness  - I have left her on her current regimen. I have asked her to try and get a set of scales for daily weights. Continue sodium restriction.   2. Chronic atrial fib - back on coumadin - managed with rate control and anticoagulation - stable.   3. S/P cardiac cath - with nonobstructive disease noted.   She will see Dr. Elease Hashimoto back at her regular visit in June.   Patient is agreeable  to this plan and will call if any problems develop in the interim.   Rosalio Macadamia, RN, ANP-C [facility] [address] [place], [place]  [address]

## 2012-04-28 NOTE — Patient Instructions (Addendum)
Continue with your current medicines  You need to buy some digital scales to weigh each morning at home  Weigh yourself each morning and record.  Take extra dose of diuretic (your Lasix) for weight gain of 2 to 3 pounds in 24 hours.   Limit sodium intake. Goal is to have less than 2000 mg (2gm) of salt per day.  See Dr. Elease Hashimoto in  June as planned  Call the [facility] office at [phone] if you have any questions, problems or concerns.

## 2012-04-30 ENCOUNTER — Encounter: Payer: Medicare HMO | Admitting: Family

## 2012-05-05 ENCOUNTER — Ambulatory Visit (INDEPENDENT_AMBULATORY_CARE_PROVIDER_SITE_OTHER): Payer: Medicare HMO | Admitting: Family

## 2012-05-05 DIAGNOSIS — I4891 Unspecified atrial fibrillation: Secondary | ICD-10-CM

## 2012-05-05 DIAGNOSIS — Z7901 Long term (current) use of anticoagulants: Secondary | ICD-10-CM

## 2012-05-05 LAB — POCT INR: INR: 2.8

## 2012-05-05 NOTE — Patient Instructions (Addendum)
Same dosage 1 pill everyday except 1/2 pill on Fridays. Recheck in 4 week.   Anticoagulation Dose Instructions as of 05/05/2012     Glynis Smiles Tue Wed Thu Fri Sat   New Dose 5 mg 5 mg 5 mg 5 mg 5 mg 2.5 mg 5 mg    Description       Same dosage 1 pill everyday except 1/2 pill on Fridays. Recheck in 4 week.

## 2012-05-06 ENCOUNTER — Ambulatory Visit: Payer: Medicare HMO | Admitting: Family Medicine

## 2012-05-09 ENCOUNTER — Ambulatory Visit (INDEPENDENT_AMBULATORY_CARE_PROVIDER_SITE_OTHER): Payer: Medicare HMO | Admitting: Family

## 2012-05-09 ENCOUNTER — Other Ambulatory Visit: Payer: Self-pay | Admitting: Family

## 2012-05-09 ENCOUNTER — Ambulatory Visit: Payer: Medicare HMO | Admitting: Family Medicine

## 2012-05-09 ENCOUNTER — Encounter: Payer: Self-pay | Admitting: Family

## 2012-05-09 VITALS — BP 100/80 | HR 68 | Wt 156.0 lb

## 2012-05-09 DIAGNOSIS — E538 Deficiency of other specified B group vitamins: Secondary | ICD-10-CM

## 2012-05-09 DIAGNOSIS — L819 Disorder of pigmentation, unspecified: Secondary | ICD-10-CM

## 2012-05-09 DIAGNOSIS — D692 Other nonthrombocytopenic purpura: Secondary | ICD-10-CM

## 2012-05-09 DIAGNOSIS — D510 Vitamin B12 deficiency anemia due to intrinsic factor deficiency: Secondary | ICD-10-CM

## 2012-05-09 MED ORDER — HYDROCORTISONE 1 % EX GEL: CUTANEOUS | Status: DC

## 2012-05-09 MED ORDER — CYANOCOBALAMIN 1000 MCG/ML IJ SOLN
1000.0000 ug | Freq: Once | INTRAMUSCULAR | Status: AC
  Administered 2012-05-09: 1000 ug via INTRAMUSCULAR

## 2012-05-09 NOTE — Progress Notes (Signed)
Subjective:    Patient ID: [name], female    DOB: 11/15/1926, 79 y.o.   MRN: [id number]  HPI 79 year old white female, nonsmoker, patient of Dr. Caryl Never is in today with complaints of a rash on her lower shin area has been present x2 weeks. She's been applying hydrocortisone cream that appears to be helping some. She noticed a rash after a cardiac cath. Denies any itching, burning, or pain.   Review of Systems  Constitutional: Negative.   Respiratory: Negative.   Cardiovascular: Negative.   Gastrointestinal: Negative.   Skin: Positive for rash.       Rash to the lower shin area, red, non-itching  Hematological: Negative.   Psychiatric/Behavioral: Negative.    Past Medical History  Diagnosis Date  . B12 DEFICIENCY 11/25/2009  . ESSENTIAL HYPERTENSION 09/30/2009  . CHF 01/27/2009  . SMALL BOWEL OBSTRUCTION 05/05/2008  . DIVERTICULITIS OF COLON 01/27/2009  . OSTEOARTHRITIS, KNEE, RIGHT 02/08/2009  . OSTEOPENIA 01/27/2009  . Left ventricular dysfunction   . Hypertension   . Chronic systolic heart failure 07/25/2009    a. Myoview 5/12:  Low risk, no ischemia, apical lateral defect probably breast attenuation, inf HK, EF 45%.  b. Echo 8/12: EF 45-50%, inf/inf-septal and apical HK, mild LVH, mild to mod MR, mild to mod TR, PASP 50;  c.  Echo 3/14:  Mild LVH, EF 20%, diff HK, mild to mod MR, mild LAE, mild to mod reduced RVSF, mild RAE, mild to mod TR, PASP 41  . Hemorrhoids   . Rectal bleeding   . Atrial fib/flutter, transient     History   Social History  . Marital Status: Widowed    Spouse Name: N/A    Number of Children: N/A  . Years of Education: N/A   Occupational History  . Not on file.   Social History Main Topics  . Smoking status: Never Smoker   . Smokeless tobacco: Never Used  . Alcohol Use: No  . Drug Use: No  . Sexually Active: Not Currently   Other Topics Concern  . Not on file   Social History Narrative  . No narrative on file    Past Surgical  History  Procedure Laterality Date  . Colon surgery  2006    Sigmoid colectomy for diverticulitis  . Abdominal hysterectomy  1996    TAHBSO  . Appendectomy  1951  . Tonsillectomy  1966  . Cardiac catheterization  April 14, 2012    nonobstructive CAD; severe LV dysfunction.     Family History  Problem Relation Age of Onset  . Heart disease    . Emphysema    . Heart disease Father   . Heart disease Sister     Allergies  Allergen Reactions  . Bactrim   . Codeine   . Nitrofurantoin   . Nitrofurantoin Monohyd Macro   . Penicillins   . Sulfamethoxazole W-Trimethoprim     Current Outpatient Prescriptions on File Prior to Visit  Medication Sig Dispense Refill  . furosemide (LASIX) 20 MG tablet Take 60 mg by mouth daily.      . hydrocortisone (ANUSOL-HC) 25 MG suppository Place 25 mg rectally 2 (two) times daily as needed.       . loratadine (CLARITIN) 10 MG tablet Take 10 mg by mouth daily.        Marland Kitchen losartan (COZAAR) 50 MG tablet TAKE ONE TABLET BY MOUTH EVERY DAY  90 tablet  3  . metoprolol (LOPRESSOR) 100 MG tablet Take 1 tablet (  100 mg total) by mouth 2 (two) times daily.  60 tablet  5  . Multiple Vitamin (MULTIVITAMIN) tablet Take 1 tablet by mouth daily.        . Multiple Vitamins-Minerals (ICAPS PO) Take 2 tablets by mouth daily.       . potassium chloride (K-DUR,KLOR-CON) 10 MEQ tablet Take 2 tablets (20 mEq total) by mouth daily.  60 tablet  11  . PROCTOSOL HC 2.5 % rectal cream Place 1 application rectally daily.       Marland Kitchen warfarin (COUMADIN) 5 MG tablet Take 1-1.5 tablets (5-7.5 mg total) by mouth daily. Take as directed by Anticoagulation clinic.  Pt takes up to 1 1/2 tablets daily.  45 tablet  2   Current Facility-Administered Medications on File Prior to Visit  Medication Dose Route Frequency Provider Last Rate Last Dose  . cyanocobalamin ((VITAMIN B-12)) injection 1,000 mcg  1,000 mcg Intramuscular Once [name], MD        BP 100/80  Pulse 68  Wt 156 lb  (70.761 kg)  BMI 25.19 kg/m2  SpO2 98%chart and    Objective:   Physical Exam  Constitutional: She is oriented to person, place, and time. She appears well-developed and well-nourished.  Cardiovascular: Normal rate, regular rhythm and normal heart sounds.   Pulmonary/Chest: Effort normal and breath sounds normal.  Neurological: She is alert and oriented to person, place, and time.  Skin: Skin is warm and dry. Rash noted.  Red, speckled, macular rash noted to the lower shin area bilaterally. Varicose veins also noted. No warmth to touch. No drainage or discharge.          Assessment & Plan:  Assessment: 1. Pigmented Purpura  2. Vitamin B12 deficiency  Plan: Hydrocortisone gel to the affected area twice a day. Patient call the office if symptoms worsen or persist. Recheck a schedule, and as needed Continue current medications. Recheck for PT/INR visit a schedule.Marland Kitchen

## 2012-05-09 NOTE — Patient Instructions (Addendum)
Pigmented Purpura Overview There are several types of pigmented purpura, also called capillaritis, but the most common condition appears as pinhead-sized, reddish brown spots sprinkled in clusters on the skin, usually on the legs. The condition can fade away in a few weeks or months, or it can last years. To date, there is no effective treatment for pigmented purpura. Causes of Pigmented Purpura The cause (etiology) of pigmented purpura is generally unknown. There may be tiny blood vessel (capillary) fragility that allows red blood cells to escape into the skin. These eruptions can occur in young as well as older adults. Onset has been noted as a side effect of certain medication, as a reaction to certain foods, or due to a viral infection.

## 2012-05-21 ENCOUNTER — Encounter: Payer: Self-pay | Admitting: Family Medicine

## 2012-05-21 ENCOUNTER — Ambulatory Visit (INDEPENDENT_AMBULATORY_CARE_PROVIDER_SITE_OTHER): Payer: Medicare HMO | Admitting: Family Medicine

## 2012-05-21 VITALS — BP 130/84 | Temp 98.9°F | Wt 158.0 lb

## 2012-05-21 DIAGNOSIS — R233 Spontaneous ecchymoses: Secondary | ICD-10-CM

## 2012-05-21 DIAGNOSIS — E538 Deficiency of other specified B group vitamins: Secondary | ICD-10-CM

## 2012-05-21 MED ORDER — CYANOCOBALAMIN 1000 MCG/ML IJ SOLN
1000.0000 ug | Freq: Once | INTRAMUSCULAR | Status: AC
  Administered 2012-05-21: 1000 ug via INTRAMUSCULAR

## 2012-05-21 NOTE — Progress Notes (Signed)
  Subjective:    Patient ID: [name], female    DOB: Aug 01, 1926, 79 y.o.   MRN: [id number]  HPI Patient with multiple chronic problems including hypertension, osteoarthritis, GERD, macular degeneration, atrial fibrillation, chronic systolic heart failure who presents for followup regarding lower extremity "rash".    Recently had cardiac catheterization which showed nonobstructive coronary artery disease. Severe left ventricular dysfunction with ejection fraction 20-25% She is still ambulating considerable distances on foot but has to stop frequently to rest. She's had some chronic lower extremity edema and takes furosemide 20 mg daily Was seen recently and told she had capillaritis. Has tried hydrocortisone cream without relief She has moderate itching.  Past Medical History  Diagnosis Date  . B12 DEFICIENCY 11/25/2009  . ESSENTIAL HYPERTENSION 09/30/2009  . CHF 01/27/2009  . SMALL BOWEL OBSTRUCTION 05/05/2008  . DIVERTICULITIS OF COLON 01/27/2009  . OSTEOARTHRITIS, KNEE, RIGHT 02/08/2009  . OSTEOPENIA 01/27/2009  . Left ventricular dysfunction   . Hypertension   . Chronic systolic heart failure 07/25/2009    a. Myoview 5/12:  Low risk, no ischemia, apical lateral defect probably breast attenuation, inf HK, EF 45%.  b. Echo 8/12: EF 45-50%, inf/inf-septal and apical HK, mild LVH, mild to mod MR, mild to mod TR, PASP 50;  c.  Echo 3/14:  Mild LVH, EF 20%, diff HK, mild to mod MR, mild LAE, mild to mod reduced RVSF, mild RAE, mild to mod TR, PASP 41  . Hemorrhoids   . Rectal bleeding   . Atrial fib/flutter, transient    Past Surgical History  Procedure Laterality Date  . Colon surgery  2006    Sigmoid colectomy for diverticulitis  . Abdominal hysterectomy  1996    TAHBSO  . Appendectomy  1951  . Tonsillectomy  1966  . Cardiac catheterization  April 14, 2012    nonobstructive CAD; severe LV dysfunction.     reports that she has never smoked. She has never used smokeless tobacco.  She reports that she does not drink alcohol or use illicit drugs. family history includes Emphysema in an unspecified family member and Heart disease in her father, sister, and unspecified family member. Allergies  Allergen Reactions  . Bactrim   . Codeine   . Nitrofurantoin   . Nitrofurantoin Monohyd Macro   . Penicillins   . Sulfamethoxazole W-Trimethoprim       Review of Systems  Constitutional: Negative for fever and chills.  Respiratory: Positive for shortness of breath.   Cardiovascular: Positive for leg swelling. Negative for chest pain.       Objective:   Physical Exam  Constitutional: She appears well-developed and well-nourished.  Cardiovascular: Normal rate.   Irregular rhythm rate controlled  Pulmonary/Chest: Effort normal and breath sounds normal. No respiratory distress. She has no wheezes. She has no rales.  Musculoskeletal: She exhibits edema.  Neurological: She is alert.  Skin:  Patient has some nonblanching petechiae lower legs bilaterally. No epidermal involvement          Assessment & Plan:  Capillaritis/petechiae lower extremities related to edema. She is reluctant to wear compression garments. We explained topicals such as steroids are not likely to benefit. Elevate legs frequently.

## 2012-05-21 NOTE — Patient Instructions (Addendum)
You have capillaritis which is where blood has extravasated outside very small blood vessels and the color changes you see are related to pigment from the blood.  This comes most often from leg edema/swelling.

## 2012-05-26 ENCOUNTER — Ambulatory Visit (INDEPENDENT_AMBULATORY_CARE_PROVIDER_SITE_OTHER): Payer: Medicare HMO | Admitting: Family

## 2012-05-26 DIAGNOSIS — Z7901 Long term (current) use of anticoagulants: Secondary | ICD-10-CM

## 2012-05-26 DIAGNOSIS — I4891 Unspecified atrial fibrillation: Secondary | ICD-10-CM

## 2012-05-26 LAB — POCT INR: INR: 2.8

## 2012-05-26 NOTE — Patient Instructions (Addendum)
Same dosage 1 pill everyday except 1/2 pill on Fridays. Recheck in 4 week.   Anticoagulation Dose Instructions as of 05/26/2012     Glynis Smiles Tue Wed Thu Fri Sat   New Dose 5 mg 5 mg 5 mg 5 mg 5 mg 2.5 mg 5 mg    Description       Same dosage 1 pill everyday except 1/2 pill on Fridays. Recheck in 4 week.

## 2012-05-28 ENCOUNTER — Other Ambulatory Visit: Payer: Self-pay | Admitting: Family Medicine

## 2012-06-23 ENCOUNTER — Encounter: Payer: Medicare HMO | Admitting: Family

## 2012-06-27 ENCOUNTER — Ambulatory Visit (INDEPENDENT_AMBULATORY_CARE_PROVIDER_SITE_OTHER): Payer: Medicare HMO | Admitting: Family

## 2012-06-27 ENCOUNTER — Encounter: Payer: Medicare HMO | Admitting: Family

## 2012-06-27 DIAGNOSIS — Z7901 Long term (current) use of anticoagulants: Secondary | ICD-10-CM

## 2012-06-27 DIAGNOSIS — I4891 Unspecified atrial fibrillation: Secondary | ICD-10-CM

## 2012-06-27 LAB — POCT INR: INR: 4.8

## 2012-06-27 MED ORDER — WARFARIN SODIUM 5 MG PO TABS: 5.0000 mg | ORAL_TABLET | Freq: Every day | ORAL | Status: DC

## 2012-06-27 NOTE — Patient Instructions (Addendum)
Hold Coumadin x 2 days (Friday and Saturday). Resume Sunday, dosage 1 pill everyday except 1/2 pill on Mondays and Fridays. Recheck in 2 week.  Anticoagulation Dose Instructions as of 06/27/2012     [name] Tue Wed Thu Fri Sat   New Dose 5 mg 2.5 mg 5 mg 5 mg 5 mg 2.5 mg 5 mg    Description       Hold Coumadin x 2 days (Friday and Saturday). Resume Sunday, dosage 1 pill everyday except 1/2 pill on Mondays and Fridays. Recheck in 2 week.

## 2012-07-01 ENCOUNTER — Ambulatory Visit: Payer: Medicare HMO | Admitting: Cardiovascular Disease

## 2012-07-02 ENCOUNTER — Ambulatory Visit (INDEPENDENT_AMBULATORY_CARE_PROVIDER_SITE_OTHER): Payer: Medicare HMO | Admitting: Cardiovascular Disease

## 2012-07-02 ENCOUNTER — Encounter: Payer: Self-pay | Admitting: Cardiovascular Disease

## 2012-07-02 VITALS — BP 130/78 | HR 72 | Ht 66.0 in | Wt 158.0 lb

## 2012-07-02 DIAGNOSIS — I5022 Chronic systolic (congestive) heart failure: Secondary | ICD-10-CM

## 2012-07-02 DIAGNOSIS — I509 Heart failure, unspecified: Secondary | ICD-10-CM

## 2012-07-02 NOTE — Progress Notes (Signed)
[name] Date of Birth  10/18/1926       [facility]    [place] [address]  [address] [place], [place]  [address]   [place], [place]  [address] [phone]     [phone]   Fax  [phone]    Fax [phone]  Problem List: 1. Atrial fibrillation 2. Hypertension 3. Benign Positional Vertigo  History of Present Illness:  [name] is a 79 y.o. female with a history of atrial fibrillation. She was cardioverted in October, 2012. She unfortunately has gone back into atrial fibrillation.  She has felt very well. She's not having any episodes of chest pain or shortness breath. She's tolerating the rhythm quite well. Her only complaint is that she's itching from the patches were placed during the cardioversion. She also complains of having very dry and cracking skin on her hands.   Sept. 16, 2013 She has been exercising regularly.  She has gained weight since I last saw her ( 11 lbs.)  She has no complaints of dyspnea or chest pain.  March 21, 2012:  She was seen by Lorin Picket several weeks ago. She was scheduled have an echocardiogram but she has not had that yet.  ( was scheduled but we had a big snow storm).  She's been having more shortness breath and more leg edema.  Scar decreased her Lasix. Her leg swelling is better.  He also has some chest heaviness.  She is trying to limit her stair climbing.  April 04, 2012:  Her recent echo shows markedly decreased LV function   Left ventricle: The cavity size was mildly dilated. Wall thickness was increased in a pattern of mild LVH. The estimated ejection fraction was 20%. Diffuse hypokinesis. - Mitral valve: Mild to moderate regurgitation. - Left atrium: The atrium was mildly dilated. - Right ventricle: Systolic function was mildly to moderately reduced. - Right atrium: The atrium was mildly dilated. - Tricuspid valve: Mild-moderate regurgitation. - Pulmonary arteries: PA peak  pressure: 41mm Hg (S).   her myoivew study revealed an apical defect - ? Apical thinning.  Low risk study.  She continues to have shortness breath. Not sure that she's having any chest pain.  It's very difficult to get an accurate history of her. She seems to ramble in her conversation.  July 02, 2012:  Flow had a cardiac cath since of last year.  She developed a rash after the cath  She has nonobstructive coronary artery disease. Her ejection fraction is severely depressed with an EF of 20-25%. She continues to have atrial fibrillation with a well-controlled ventricular response. She is on chronic Coumadin therapy.  She goes to the [place] twice a week.   She walks to [employer] ([place],- about 1 mile)  She stays very active.    She complains of being short of breath but she remains very active.    Current Outpatient Prescriptions on File Prior to Visit  Medication Sig Dispense Refill  . ANUCORT-HC 25 MG suppository INSERT ONE SUPPOSITORY RECTALLY TWICE DAILY  24 suppository  1  . furosemide (LASIX) 20 MG tablet Take 60 mg by mouth 3 (three) times daily.       . hydrocortisone (ANUSOL-HC) 25 MG suppository Place 25 mg rectally 2 (two) times daily as needed.       . loratadine (CLARITIN) 10 MG tablet Take 10 mg by mouth daily.        Marland Kitchen losartan (COZAAR)  50 MG tablet TAKE ONE TABLET BY MOUTH EVERY DAY  90 tablet  3  . metoprolol (LOPRESSOR) 100 MG tablet Take 1 tablet (100 mg total) by mouth 2 (two) times daily.  60 tablet  5  . Multiple Vitamin (MULTIVITAMIN) tablet Take 1 tablet by mouth daily.        . Multiple Vitamins-Minerals (ICAPS PO) Take 2 tablets by mouth daily.       . potassium chloride (K-DUR,KLOR-CON) 10 MEQ tablet Take 2 tablets (20 mEq total) by mouth daily.  60 tablet  11  . PROCTOSOL HC 2.5 % rectal cream Place 1 application rectally daily.       Marland Kitchen warfarin (COUMADIN) 5 MG tablet Take 1-1.5 tablets (5-7.5 mg total) by mouth daily. Take as directed by Anticoagulation  clinic.  Pt takes up to 1 1/2 tablets daily.  45 tablet  2   Current Facility-Administered Medications on File Prior to Visit  Medication Dose Route Frequency Provider Last Rate Last Dose  . cyanocobalamin ((VITAMIN B-12)) injection 1,000 mcg  1,000 mcg Intramuscular Once [name], MD        Allergies  Allergen Reactions  . Bactrim   . Codeine   . Nitrofurantoin   . Nitrofurantoin Monohyd Macro   . Penicillins   . Sulfamethoxazole W-Trimethoprim     Past Medical History  Diagnosis Date  . B12 DEFICIENCY 11/25/2009  . ESSENTIAL HYPERTENSION 09/30/2009  . CHF 01/27/2009  . SMALL BOWEL OBSTRUCTION 05/05/2008  . DIVERTICULITIS OF COLON 01/27/2009  . OSTEOARTHRITIS, KNEE, RIGHT 02/08/2009  . OSTEOPENIA 01/27/2009  . Left ventricular dysfunction   . Hypertension   . Chronic systolic heart failure 07/25/2009    a. Myoview 5/12:  Low risk, no ischemia, apical lateral defect probably breast attenuation, inf HK, EF 45%.  b. Echo 8/12: EF 45-50%, inf/inf-septal and apical HK, mild LVH, mild to mod MR, mild to mod TR, PASP 50;  c.  Echo 3/14:  Mild LVH, EF 20%, diff HK, mild to mod MR, mild LAE, mild to mod reduced RVSF, mild RAE, mild to mod TR, PASP 41  . Hemorrhoids   . Rectal bleeding   . Atrial fib/flutter, transient     Past Surgical History  Procedure Laterality Date  . Colon surgery  2006    Sigmoid colectomy for diverticulitis  . Abdominal hysterectomy  1996    TAHBSO  . Appendectomy  1951  . Tonsillectomy  1966  . Cardiac catheterization  April 14, 2012    nonobstructive CAD; severe LV dysfunction.     History  Smoking status  . Never Smoker   Smokeless tobacco  . Never Used    History  Alcohol Use No    Family History  Problem Relation Age of Onset  . Heart disease    . Emphysema    . Heart disease Father   . Heart disease Sister     Reviw of Systems:  Reviewed in the HPI.  All other systems are negative.  Physical Exam: Blood pressure 130/78,  pulse 72, height 5\' 6"  (1.676 m), weight 158 lb (71.668 kg). She did not take any of her medications today.  General: Well developed, well nourished, in no acute distress.  Head: Normocephalic, atraumatic, sclera non-icteric, mucus membranes are moist,   Neck: Supple. Carotids are 2 + without bruits. No JVD  Lungs: Clear bilaterally to auscultation.  Heart: Irregularly irregular.  normal  S1 S2. No murmurs, gallops or rubs.  Abdomen: Soft, non-tender, non-distended  with normal bowel sounds. No hepatomegaly. No rebound/guarding. No masses.  Msk:  Strength and tone are normal  Extremities: No clubbing or cyanosis. No edema.  Distal pedal pulses are 2+ and equal bilaterally.  Neuro: Alert and oriented X 3. Moves all extremities spontaneously.  Psych:  Responds to questions appropriately with a normal affect.  ECG:  Assessment / Plan:

## 2012-07-02 NOTE — Patient Instructions (Addendum)
Your physician wants you to follow-up in: 6 months  You will receive a reminder letter in the mail two months in advance. If you don't receive a letter, please call our office to schedule the follow-up appointment.  Continue walking as you are.  Your physician recommends that you continue on your current medications as directed. Please refer to the Current Medication list given to you today.

## 2012-07-02 NOTE — Assessment & Plan Note (Signed)
[name] seems to be doing OK from a cardiac standpoint.  She complains of not feeling well but at the same time, she works out twice a week, walks to [employer] and her pharmacy.  She is 79 yo and has an EF of 30%.  She has nonobstructive CAD.  Will continue current meds.

## 2012-07-10 ENCOUNTER — Other Ambulatory Visit: Payer: Self-pay

## 2012-07-10 MED ORDER — HYDROCORTISONE ACETATE 25 MG RE SUPP: RECTAL | Status: DC

## 2012-07-14 ENCOUNTER — Ambulatory Visit (INDEPENDENT_AMBULATORY_CARE_PROVIDER_SITE_OTHER): Payer: Medicare HMO | Admitting: Family Medicine

## 2012-07-14 ENCOUNTER — Ambulatory Visit (INDEPENDENT_AMBULATORY_CARE_PROVIDER_SITE_OTHER): Payer: Medicare HMO | Admitting: General Practice

## 2012-07-14 DIAGNOSIS — I4891 Unspecified atrial fibrillation: Secondary | ICD-10-CM

## 2012-07-14 DIAGNOSIS — E538 Deficiency of other specified B group vitamins: Secondary | ICD-10-CM

## 2012-07-14 DIAGNOSIS — Z7901 Long term (current) use of anticoagulants: Secondary | ICD-10-CM

## 2012-07-14 LAB — POCT INR: INR: 3

## 2012-07-14 MED ORDER — CYANOCOBALAMIN 1000 MCG/ML IJ SOLN
1000.0000 ug | Freq: Once | INTRAMUSCULAR | Status: AC
  Administered 2012-07-14: 1000 ug via INTRAMUSCULAR

## 2012-07-14 NOTE — Progress Notes (Signed)
  Subjective:    Patient ID: [name], female    DOB: 03/09/1926, 79 y.o.   MRN: [id number]  HPI    Review of Systems     Objective:   Physical Exam        Assessment & Plan:  After obtaining verbal consent, and per orders of Dr. Caryl Never, injection of B12 given by [facility] Riannon Mukherjee. Patient tolerated/SLS

## 2012-07-16 ENCOUNTER — Other Ambulatory Visit: Payer: Self-pay | Admitting: Family Medicine

## 2012-07-16 NOTE — Telephone Encounter (Signed)
Refill OK once. 

## 2012-07-30 ENCOUNTER — Telehealth: Payer: Self-pay | Admitting: Family Medicine

## 2012-07-30 NOTE — Telephone Encounter (Signed)
I am not aware of any.  Maybe pharmacist could answer whether less expensive option available.

## 2012-07-30 NOTE — Telephone Encounter (Signed)
Pt would like to know if there is a cheaper type of hydrocortisone (ANUCORT-HC) 25 MG suppository that she could get.  Pt states these have gone up tremendously. Pls advise. Pharm:  [facility]. [address]

## 2012-07-31 MED ORDER — HYDROCORTISONE 2.5 % RE CREA: 1.0000 "application " | TOPICAL_CREAM | Freq: Two times a day (BID) | RECTAL | Status: DC

## 2012-07-31 NOTE — Telephone Encounter (Signed)
Yes, proctosol HC would be OK.

## 2012-07-31 NOTE — Telephone Encounter (Signed)
Called the pharmacist and they stated the only cheaper option is Proctosol HC 2.5% Cream and without her insurance it would be 26.22. Called patient and left message for her to return call to office. If patient is ok with the cream will it be ok to order the cream for her

## 2012-08-04 ENCOUNTER — Ambulatory Visit (INDEPENDENT_AMBULATORY_CARE_PROVIDER_SITE_OTHER): Payer: Medicare HMO | Admitting: Family Medicine

## 2012-08-04 ENCOUNTER — Telehealth: Payer: Self-pay | Admitting: Family Medicine

## 2012-08-04 ENCOUNTER — Ambulatory Visit (INDEPENDENT_AMBULATORY_CARE_PROVIDER_SITE_OTHER): Payer: Medicare HMO | Admitting: General Practice

## 2012-08-04 DIAGNOSIS — E538 Deficiency of other specified B group vitamins: Secondary | ICD-10-CM

## 2012-08-04 DIAGNOSIS — Z7901 Long term (current) use of anticoagulants: Secondary | ICD-10-CM

## 2012-08-04 DIAGNOSIS — I4891 Unspecified atrial fibrillation: Secondary | ICD-10-CM

## 2012-08-04 LAB — POCT INR: INR: 2.5

## 2012-08-04 MED ORDER — CYANOCOBALAMIN 1000 MCG/ML IJ SOLN
1000.0000 ug | Freq: Once | INTRAMUSCULAR | Status: AC
  Administered 2012-08-04: 1000 ug via INTRAMUSCULAR

## 2012-08-04 NOTE — Telephone Encounter (Signed)
Pt came in for her inr check today, stating that the nurse called her on Friday or sometime this past week telling her to take Prednisol.  Patient states that she has been taking this for 2 years already and when she went to go pick it up at drug store it was $40+ and it used to be around $20.  Please advise. Thank you.

## 2012-08-04 NOTE — Telephone Encounter (Signed)
Pt wants to know is there anything else you think that can help her pass her bowel movements. She really wants to have a suppository but the patient is saying they are getting to expensive and she dont care to much for the cream which is cheaper. Ask patient have she called other pharmacies and pt stated she has and they have are expensive.

## 2012-08-04 NOTE — Telephone Encounter (Signed)
If she is having constipation issues, she can try over-the-counter stool softener such as Colace. She should try to avoid regular use of laxatives such as MiraLax

## 2012-08-04 NOTE — Telephone Encounter (Signed)
Informed patient per dr. Caryl Never

## 2012-08-08 ENCOUNTER — Other Ambulatory Visit: Payer: Self-pay

## 2012-08-08 MED ORDER — HYDROCORTISONE 2.5 % RE CREA: TOPICAL_CREAM | RECTAL | Status: DC

## 2012-08-25 ENCOUNTER — Telehealth: Payer: Self-pay | Admitting: Family Medicine

## 2012-08-25 MED ORDER — PROMETHAZINE HCL 12.5 MG PO TABS: 12.5000 mg | ORAL_TABLET | Freq: Four times a day (QID) | ORAL | Status: DC | PRN

## 2012-08-25 NOTE — Telephone Encounter (Signed)
If she has benign positional vertigo, medications generally are not helpful. Is she having any nausea?

## 2012-08-25 NOTE — Telephone Encounter (Signed)
Refill once but caution as this can cause sedation.

## 2012-08-25 NOTE — Telephone Encounter (Signed)
Yes she is having nausea. Promethazine medication she has been taking.

## 2012-08-25 NOTE — Telephone Encounter (Signed)
Sent in rx to pharmacy.

## 2012-08-25 NOTE — Telephone Encounter (Signed)
Pt has been experiencing vertigo x4 days. Pt states that she had vertigo this time last year and was given an RX and was hoping to get a refill on this medication. Pt also requested to be contacted by the nurse

## 2012-08-25 NOTE — Telephone Encounter (Signed)
Pt complaining of lightheaded and dizziness. Pt stated that she is missing work.   [facility] pharmacy

## 2012-09-01 ENCOUNTER — Ambulatory Visit (INDEPENDENT_AMBULATORY_CARE_PROVIDER_SITE_OTHER): Payer: Medicare HMO | Admitting: General Practice

## 2012-09-01 ENCOUNTER — Ambulatory Visit (INDEPENDENT_AMBULATORY_CARE_PROVIDER_SITE_OTHER): Payer: Medicare HMO | Admitting: Family Medicine

## 2012-09-01 VITALS — BP 124/84 | Temp 98.6°F

## 2012-09-01 DIAGNOSIS — E538 Deficiency of other specified B group vitamins: Secondary | ICD-10-CM

## 2012-09-01 DIAGNOSIS — I4891 Unspecified atrial fibrillation: Secondary | ICD-10-CM

## 2012-09-01 DIAGNOSIS — Z7901 Long term (current) use of anticoagulants: Secondary | ICD-10-CM

## 2012-09-01 DIAGNOSIS — H8110 Benign paroxysmal vertigo, unspecified ear: Secondary | ICD-10-CM

## 2012-09-01 MED ORDER — CYANOCOBALAMIN 1000 MCG/ML IJ SOLN
1000.0000 ug | Freq: Once | INTRAMUSCULAR | Status: AC
  Administered 2012-09-01: 1000 ug via INTRAMUSCULAR

## 2012-09-01 MED ORDER — MECLIZINE HCL 12.5 MG PO TABS: 12.5000 mg | ORAL_TABLET | Freq: Three times a day (TID) | ORAL | Status: DC | PRN

## 2012-09-01 NOTE — Progress Notes (Signed)
No chief complaint on file.   HPI:  [name] is an 79 yo F pt of Dr. Caryl Never here for acute visit for Vertigo and wants her B12 injection: -reports this is chronic and treated with meclizine in the past and she is here for refill of this and couldn't get appt with PCP -reports extensive workup in the past (confirmed on [facility]), reports this is exactly the same as in the past -flare started > 1 week ago -vertigo with certain movements - turning to the R - improving with home exercises -denies: CP, SOB, DOE, swelling, palpitations   ROS: See pertinent positives and negatives per HPI.  Past Medical History  Diagnosis Date  . B12 DEFICIENCY 11/25/2009  . ESSENTIAL HYPERTENSION 09/30/2009  . CHF 01/27/2009  . SMALL BOWEL OBSTRUCTION 05/05/2008  . DIVERTICULITIS OF COLON 01/27/2009  . OSTEOARTHRITIS, KNEE, RIGHT 02/08/2009  . OSTEOPENIA 01/27/2009  . Left ventricular dysfunction   . Hypertension   . Chronic systolic heart failure 07/25/2009    a. Myoview 5/12:  Low risk, no ischemia, apical lateral defect probably breast attenuation, inf HK, EF 45%.  b. Echo 8/12: EF 45-50%, inf/inf-septal and apical HK, mild LVH, mild to mod MR, mild to mod TR, PASP 50;  c.  Echo 3/14:  Mild LVH, EF 20%, diff HK, mild to mod MR, mild LAE, mild to mod reduced RVSF, mild RAE, mild to mod TR, PASP 41  . Hemorrhoids   . Rectal bleeding   . Atrial fib/flutter, transient     Family History  Problem Relation Age of Onset  . Heart disease    . Emphysema    . Heart disease Father   . Heart disease Sister     History   Social History  . Marital Status: Widowed    Spouse Name: N/A    Number of Children: N/A  . Years of Education: N/A   Social History Main Topics  . Smoking status: Never Smoker   . Smokeless tobacco: Never Used  . Alcohol Use: No  . Drug Use: No  . Sexual Activity: Not Currently   Other Topics Concern  . Not on file   Social History Narrative  . No narrative on file     Current outpatient prescriptions:furosemide (LASIX) 20 MG tablet, Take 60 mg by mouth 3 (three) times daily. , Disp: , Rfl: ;  hydrocortisone (ANUCORT-HC) 25 MG suppository, INSERT ONE SUPPOSITORY RECTALLY TWICE DAILY, Disp: 24 suppository, Rfl: 1;  hydrocortisone (ANUSOL-HC) 2.5 % rectal cream, Place 1 application rectally 2 (two) times daily., Disp: 30 g, Rfl: 0 hydrocortisone (ANUSOL-HC) 25 MG suppository, Place 25 mg rectally 2 (two) times daily as needed. , Disp: , Rfl: ;  hydrocortisone (PROCTOSOL HC) 2.5 % rectal cream, PLACE RECTALLY TWICE DAILY., Disp: 29 g, Rfl: 2;  loratadine (CLARITIN) 10 MG tablet, Take 10 mg by mouth daily.  , Disp: , Rfl: ;  losartan (COZAAR) 50 MG tablet, TAKE ONE TABLET BY MOUTH EVERY DAY, Disp: 90 tablet, Rfl: 3 meclizine (ANTIVERT) 12.5 MG tablet, Take 1 tablet (12.5 mg total) by mouth 3 (three) times daily as needed., Disp: 30 tablet, Rfl: 0;  metoprolol (LOPRESSOR) 100 MG tablet, Take 1 tablet (100 mg total) by mouth 2 (two) times daily., Disp: 60 tablet, Rfl: 5;  Multiple Vitamin (MULTIVITAMIN) tablet, Take 1 tablet by mouth daily.  , Disp: , Rfl: ;  Multiple Vitamins-Minerals (ICAPS PO), Take 2 tablets by mouth daily. , Disp: , Rfl:  potassium chloride (K-DUR,KLOR-CON)  10 MEQ tablet, Take 2 tablets (20 mEq total) by mouth daily., Disp: 60 tablet, Rfl: 11;  promethazine (PHENERGAN) 12.5 MG tablet, Take 1 tablet (12.5 mg total) by mouth every 6 (six) hours as needed for nausea., Disp: 30 tablet, Rfl: 0 warfarin (COUMADIN) 5 MG tablet, Take 1-1.5 tablets (5-7.5 mg total) by mouth daily. Take as directed by Anticoagulation clinic.  Pt takes up to 1 1/2 tablets daily., Disp: 45 tablet, Rfl: 2 Current facility-administered medications:cyanocobalamin ((VITAMIN B-12)) injection 1,000 mcg, 1,000 mcg, Intramuscular, Once, [name], MD  EXAM:  There were no vitals filed for this visit.  There is no weight on file to calculate BMI.  GENERAL: vitals reviewed and  listed above, alert, oriented, appears well hydrated and in no acute distress  HEENT: atraumatic, conjunttiva clear, no obvious abnormalities on inspection of external nose and ears  NECK: no obvious masses on inspection  LUNGS: clear to auscultation bilaterally, no wheezes, rales or rhonchi, good air movement  CV: HRRR, no peripheral edema  MS: moves all extremities without noticeable abnormality  PSYCH: pleasant and cooperative, no obvious depression or anxiety  Neuro: dix hallpike + to R, CN II-XII grossly intact, finger to nose normal  ASSESSMENT AND PLAN:  Discussed the following assessment and plan:  B12 DEFICIENCY  BPPV (benign paroxysmal positional vertigo) - Plan: meclizine (ANTIVERT) 12.5 MG tablet  -BPPV, recurrent, dix hallpike + - advised PT but she prefers to continue home exercise for a 1 week trial and will call PCP if decides to do this, meclizine refilled -B12 given -Patient advised to return or notify a doctor immediately if symptoms worsen or persist or new concerns arise.  Patient Instructions  Benign Positional Vertigo Vertigo means you feel like you or your surroundings are moving when they are not. Benign positional vertigo is the most common form of vertigo. Benign means that the cause of your condition is not serious. Benign positional vertigo is more common in older adults. CAUSES  Benign positional vertigo is the result of an upset in the labyrinth system. This is an area in the middle ear that helps control your balance. This may be caused by a viral infection, head injury, or repetitive motion. However, often no specific cause is found. SYMPTOMS  Symptoms of benign positional vertigo occur when you move your head or eyes in different directions. Some of the symptoms may include:  Loss of balance and falls.  Vomiting.  Blurred vision.  Dizziness.  Nausea.  Involuntary eye movements (nystagmus). DIAGNOSIS  Benign positional vertigo is  usually diagnosed by physical exam. If the specific cause of your benign positional vertigo is unknown, your caregiver may perform imaging tests, such as magnetic resonance imaging (MRI) or computed tomography (CT). TREATMENT  Your caregiver may recommend movements or procedures to correct the benign positional vertigo. Medicines such as meclizine, benzodiazepines, and medicines for nausea may be used to treat your symptoms. In rare cases, if your symptoms are caused by certain conditions that affect the inner ear, you may need surgery. HOME CARE INSTRUCTIONS   Follow your caregiver's instructions.  Move slowly. Do not make sudden body or head movements.  Avoid driving.  Avoid operating heavy machinery.  Avoid performing any tasks that would be dangerous to you or others during a vertigo episode.  Drink enough fluids to keep your urine clear or pale yellow. SEEK IMMEDIATE MEDICAL CARE IF:   You develop problems with walking, weakness, numbness, or using your arms, hands, or legs.  You have difficulty speaking.  You develop severe headaches.  Your nausea or vomiting continues or gets worse.  You develop visual changes.  Your family or friends notice any behavioral changes.  Your condition gets worse.  You have a fever.  You develop a stiff neck or sensitivity to light. MAKE SURE YOU:   Understand these instructions.  Will watch your condition.  Will get help right away if you are not doing well or get worse. Document Released: 10/02/2005 Document Revised: 03/19/2011 Document Reviewed: 09/14/2010 [facility] Patient Information 2014 [place], Lona Kettle, Dahlia Client R.

## 2012-09-01 NOTE — Addendum Note (Signed)
Addended by: Azucena Freed on: 09/01/2012 09:13 AM   Modules accepted: Orders

## 2012-09-01 NOTE — Patient Instructions (Addendum)

## 2012-09-10 ENCOUNTER — Other Ambulatory Visit: Payer: Self-pay | Admitting: Family Medicine

## 2012-09-10 NOTE — Telephone Encounter (Signed)
Refilled this recently. Would advise any further refills come from PCP.

## 2012-09-10 NOTE — Telephone Encounter (Signed)
Can you please call set up appointment for patient a follow up.. Thank you

## 2012-09-10 NOTE — Telephone Encounter (Signed)
Should not be taking this regularly.  If still dizzy, needs office follow up to reassess.

## 2012-09-15 ENCOUNTER — Encounter: Payer: Self-pay | Admitting: Family Medicine

## 2012-09-15 ENCOUNTER — Ambulatory Visit (INDEPENDENT_AMBULATORY_CARE_PROVIDER_SITE_OTHER): Payer: Medicare HMO | Admitting: Family Medicine

## 2012-09-15 VITALS — BP 130/68 | HR 112 | Temp 98.7°F | Wt 161.0 lb

## 2012-09-15 DIAGNOSIS — Z23 Encounter for immunization: Secondary | ICD-10-CM

## 2012-09-15 NOTE — Progress Notes (Signed)
Subjective:    Patient ID: [name], female    DOB: [birth year], 79 y.o.   MRN: [id number]  HPI Patient seen with intermittent vertigo off and on for the past 3 weeks. She's had many year history of similar symptoms in the past. Her symptoms are very sporadic. Tends to be worse first thing in the morning. Denies any acute hearing changes. No nausea or vomiting. Taken meclizine without much improvement. No ataxia. No speech changes. No dysphagia.  Also complaining of chronic hemorrhoids. She had recent colonoscopy which did not show any other major abnormalities. She has intermittent perianal hemorrhoid inflammation and occasional bleeding. She has used various steroid products in the past which seemed to help temporarily.  Needs flu vaccine.  Medications reviewed and compliant with all. No recent orthostasis. No chest pains. She has some dyspnea on exertion but not at rest.  Past Medical History  Diagnosis Date  . B12 DEFICIENCY 11/25/2009  . ESSENTIAL HYPERTENSION 09/30/2009  . CHF 01/27/2009  . SMALL BOWEL OBSTRUCTION 05/05/2008  . DIVERTICULITIS OF COLON 01/27/2009  . OSTEOARTHRITIS, KNEE, RIGHT 02/08/2009  . OSTEOPENIA 01/27/2009  . Left ventricular dysfunction   . Hypertension   . Chronic systolic heart failure 07/25/2009    a. Myoview 5/12:  Low risk, no ischemia, apical lateral defect probably breast attenuation, inf HK, EF 45%.  b. Echo 8/12: EF 45-50%, inf/inf-septal and apical HK, mild LVH, mild to mod MR, mild to mod TR, PASP 50;  c.  Echo 3/14:  Mild LVH, EF 20%, diff HK, mild to mod MR, mild LAE, mild to mod reduced RVSF, mild RAE, mild to mod TR, PASP 41  . Hemorrhoids   . Rectal bleeding   . Atrial fib/flutter, transient    Past Surgical History  Procedure Laterality Date  . Colon surgery  2006    Sigmoid colectomy for diverticulitis  . Abdominal hysterectomy  1996    TAHBSO  . Appendectomy  1951  . Tonsillectomy  1966  . Cardiac catheterization  April 14, 2012     nonobstructive CAD; severe LV dysfunction.     reports that she has never smoked. She has never used smokeless tobacco. She reports that she does not drink alcohol or use illicit drugs. family history includes Emphysema in an other family member; Heart disease in her father, sister, and another family member. Allergies  Allergen Reactions  . Bactrim   . Codeine   . Contrast Media [Iodinated Diagnostic Agents]   . Nitrofurantoin   . Nitrofurantoin Monohyd Macro   . Penicillins   . Sulfamethoxazole W-Trimethoprim       Review of Systems  Constitutional: Negative for fever and chills.  HENT: Negative for hearing loss and ear pain.   Respiratory: Negative for cough.   Cardiovascular: Negative for chest pain, palpitations and leg swelling.  Gastrointestinal: Negative for abdominal pain.  Genitourinary: Negative for dysuria.  Neurological: Positive for dizziness. Negative for seizures, syncope, weakness and headaches.  Psychiatric/Behavioral: Negative for confusion.       Objective:   Physical Exam  Constitutional: She is oriented to person, place, and time. She appears well-developed and well-nourished.  HENT:  Mouth/Throat: Oropharynx is clear and moist.  Minimal cerumen both canals  Neck: Neck supple.  Cardiovascular: Normal rate and regular rhythm.   Pulmonary/Chest: Effort normal and breath sounds normal. No respiratory distress. She has no wheezes. She has no rales.  Musculoskeletal:  No pitting edema  Neurological: She is alert and oriented to person, place, and  time. No cranial nerve deficit.  Gait is normal. No focal weakness. Romberg is negative or normal  Psychiatric: She has a normal mood and affect. Her behavior is normal.          Assessment & Plan:  #1 chronic vertigo. Symptoms seem to be relatively mild and very intermittent. She does not have any red flag features. Avoid regular use of medications. She's been given vestibular rehabilitation in the past  has home exercises which helped. Continue with those. Be in touch in one to 2 weeks if not resolving #2 health maintenance. Flu vaccine given. #3 hypertension stable #4 history of atrial fibrillation on Coumadin.

## 2012-09-18 ENCOUNTER — Other Ambulatory Visit: Payer: Self-pay | Admitting: Family Medicine

## 2012-09-18 ENCOUNTER — Other Ambulatory Visit: Payer: Self-pay

## 2012-09-18 ENCOUNTER — Other Ambulatory Visit: Payer: Self-pay | Admitting: *Deleted

## 2012-09-18 DIAGNOSIS — R0789 Other chest pain: Secondary | ICD-10-CM

## 2012-09-18 MED ORDER — METOPROLOL TARTRATE 100 MG PO TABS: 100.0000 mg | ORAL_TABLET | Freq: Two times a day (BID) | ORAL | Status: DC

## 2012-09-18 MED ORDER — HYDROCORTISONE 2.5 % RE CREA: TOPICAL_CREAM | RECTAL | Status: DC

## 2012-09-19 ENCOUNTER — Other Ambulatory Visit: Payer: Self-pay | Admitting: Family Medicine

## 2012-09-29 ENCOUNTER — Ambulatory Visit (INDEPENDENT_AMBULATORY_CARE_PROVIDER_SITE_OTHER): Payer: Medicare HMO | Admitting: General Practice

## 2012-09-29 DIAGNOSIS — Z7901 Long term (current) use of anticoagulants: Secondary | ICD-10-CM

## 2012-09-29 DIAGNOSIS — E538 Deficiency of other specified B group vitamins: Secondary | ICD-10-CM

## 2012-09-29 DIAGNOSIS — I4891 Unspecified atrial fibrillation: Secondary | ICD-10-CM

## 2012-09-29 MED ORDER — CYANOCOBALAMIN 1000 MCG/ML IJ SOLN
1000.0000 ug | Freq: Once | INTRAMUSCULAR | Status: AC
  Administered 2012-09-29: 1000 ug via INTRAMUSCULAR

## 2012-10-29 ENCOUNTER — Other Ambulatory Visit: Payer: Self-pay | Admitting: Physician Assistant

## 2012-10-29 MED ORDER — FUROSEMIDE 20 MG PO TABS: 60.0000 mg | ORAL_TABLET | Freq: Three times a day (TID) | ORAL | Status: DC

## 2012-10-29 NOTE — Addendum Note (Signed)
Addended by: Carmela Hurt on: 10/29/2012 06:15 PM   Modules accepted: Orders

## 2012-10-31 ENCOUNTER — Other Ambulatory Visit: Payer: Self-pay

## 2012-11-08 IMAGING — CT CT ABD-PELV W/ CM
2 of 5 series · 17 of 46 positions shown, 19 images · IV contrast (APPLIED)
Comparison: 03/25/2008

CLINICAL DATA: Abdominal cramping.  History diverticulitis,
hemicolectomy, appendectomy.

CT ABDOMEN AND PELVIS WITH CONTRAST
TECHNIQUE: Multidetector CT imaging of the abdomen and pelvis was
performed following the standard protocol during bolus
administration of intravenous contrast.
Contrast: 100 ml Gmnipaque-ISS IV

[Series 2: abd_pel 5.0 b40f st · axial · 0.66mm/px · z∈[-442,-7]mm · 14 of 99 slices shown, 16 images]
[im 6/99  soft-tissue]
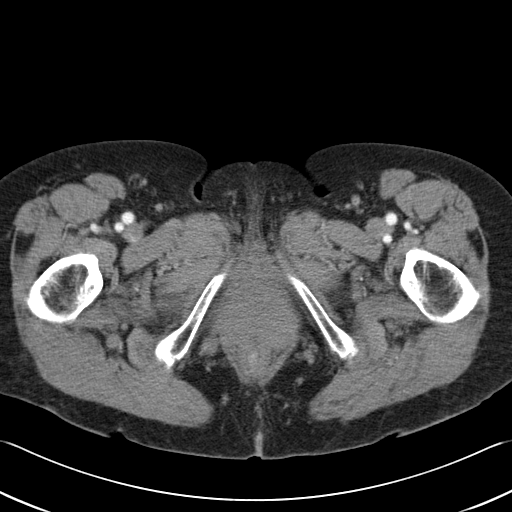
[im 6/99  bone]
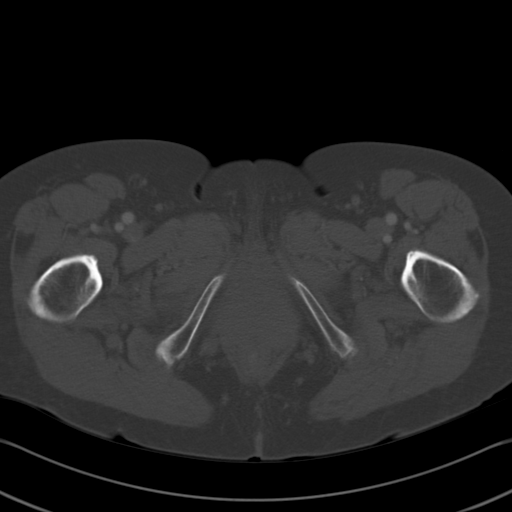
[im 11/99  soft-tissue]
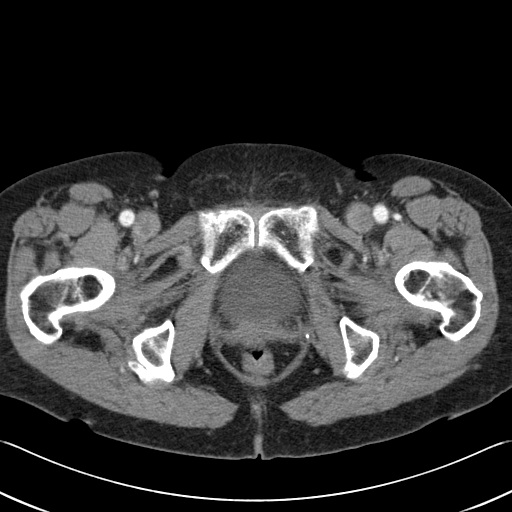
[im 21/99  soft-tissue]
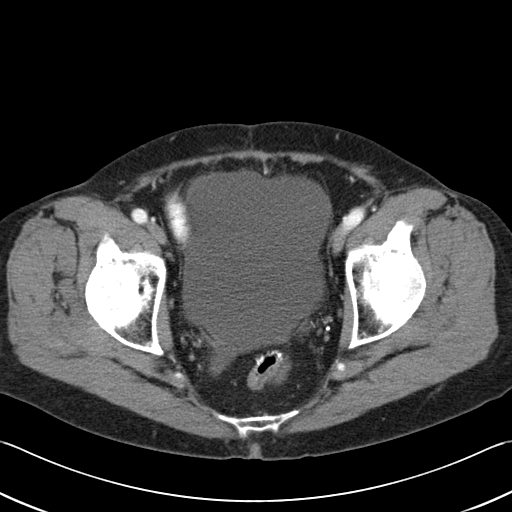
[im 26/99  soft-tissue]
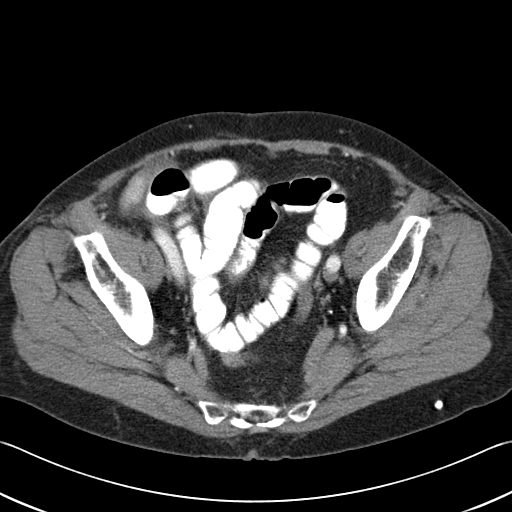
[im 31/99  soft-tissue]
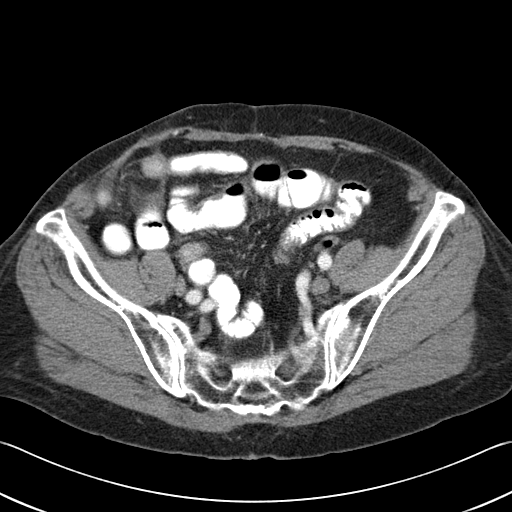
[im 42/99  soft-tissue]
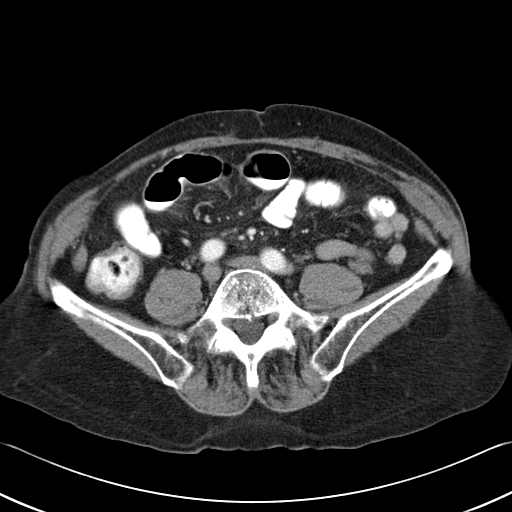
[im 47/99  soft-tissue]
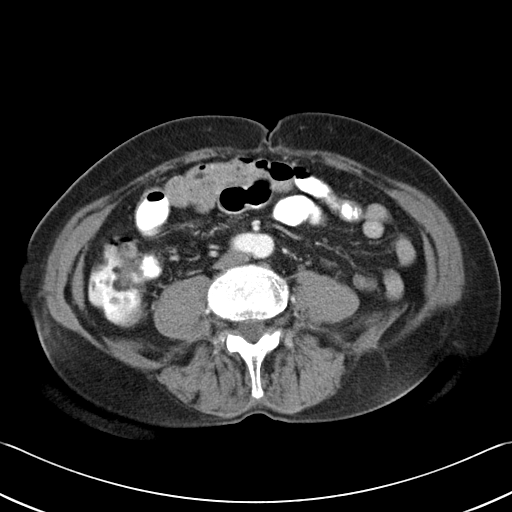
[im 52/99  soft-tissue]
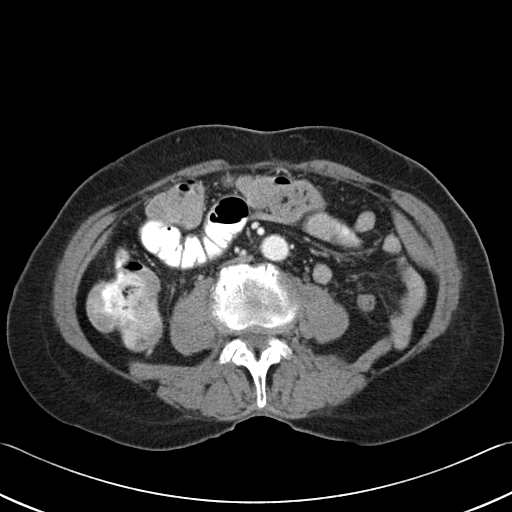
[im 57/99  soft-tissue]
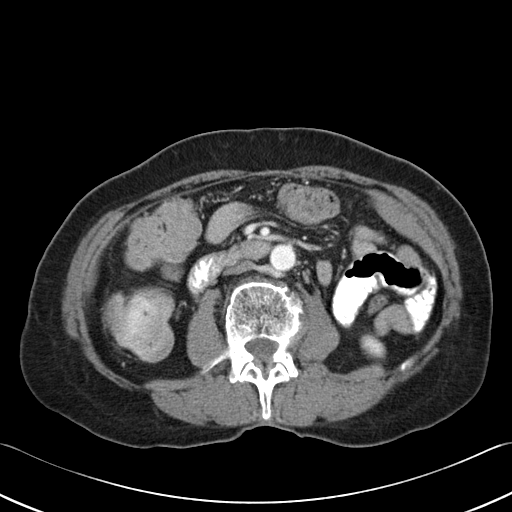
[im 57/99  bone]
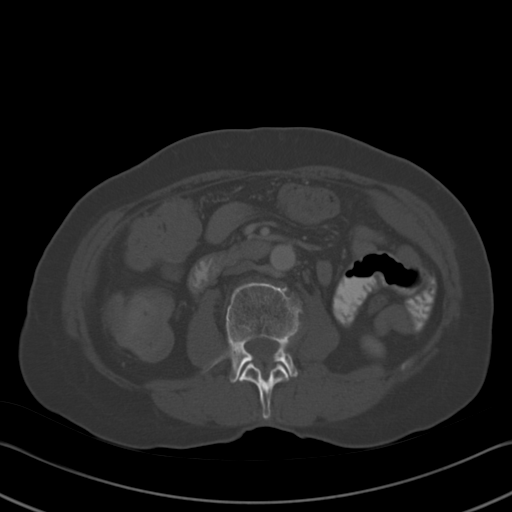
[im 68/99  soft-tissue]
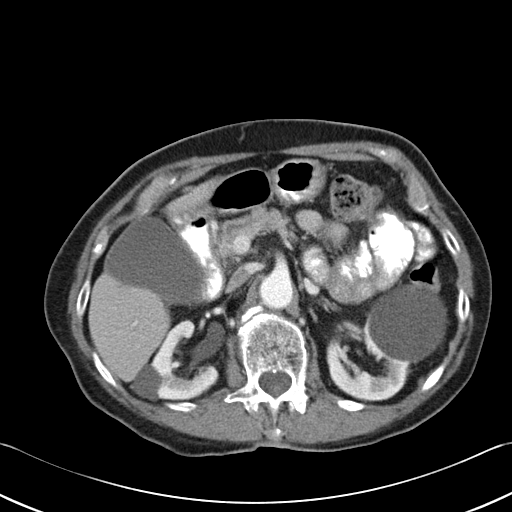
[im 73/99  soft-tissue]
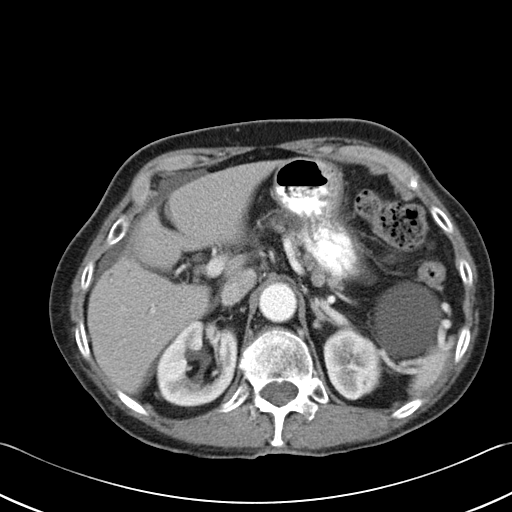
[im 78/99  soft-tissue]
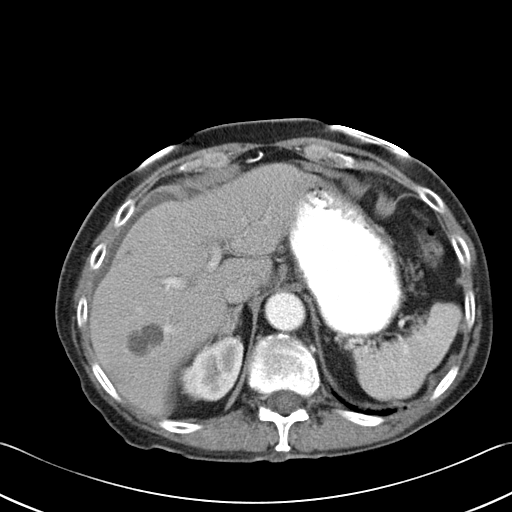
[im 88/99  soft-tissue]
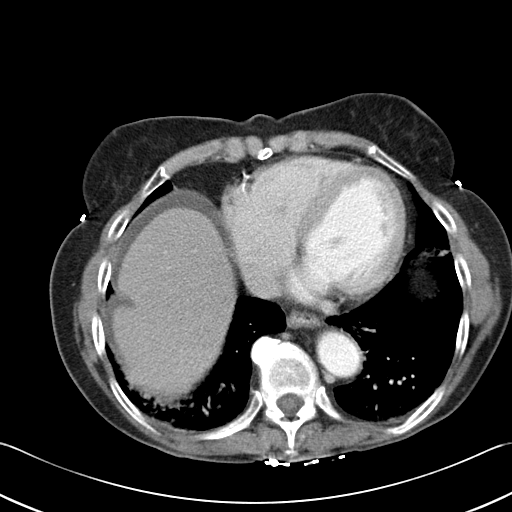
[im 93/99  soft-tissue]
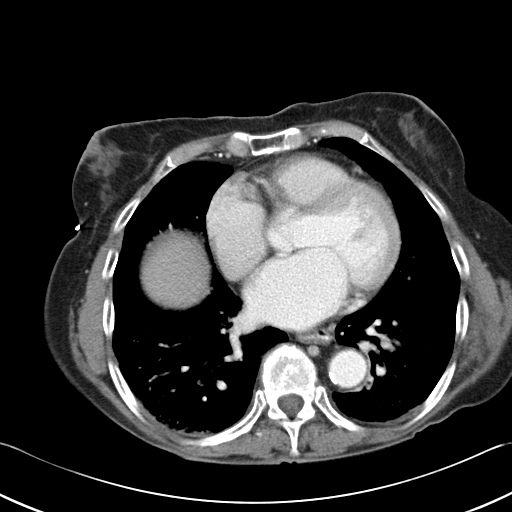

[Series 602: coronal · coronal · 1.00mm/px · 3 of 73 slices shown]
[im 25/73  soft-tissue]
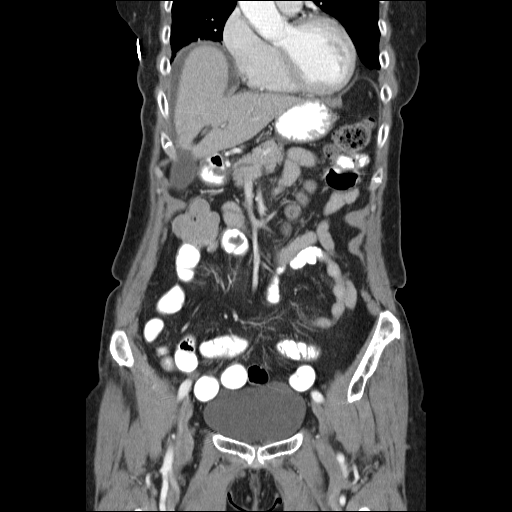
[im 33/73  soft-tissue]
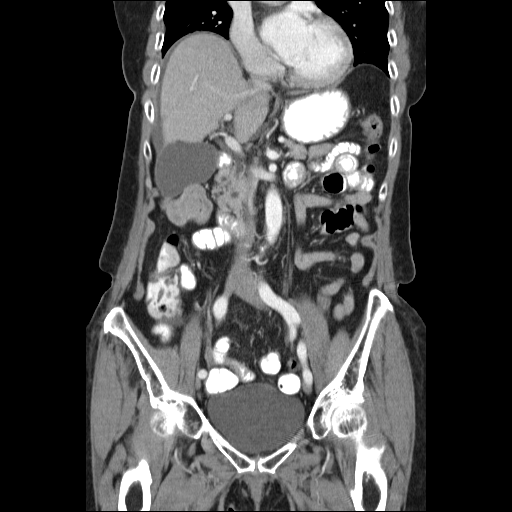
[im 41/73  soft-tissue]
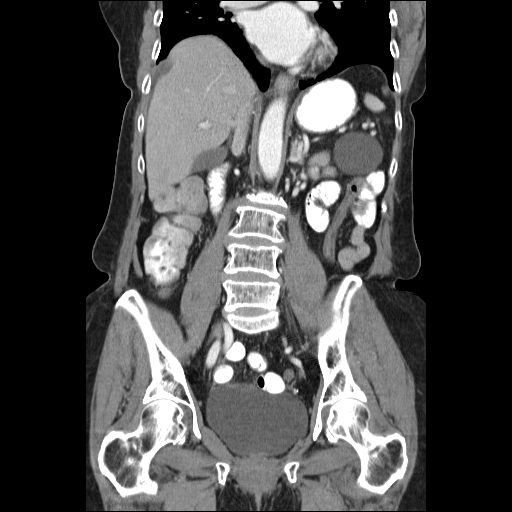

[17 of 46 positions shown; findings below may reference images not displayed]

FINDINGS: Patchy infiltrates or atelectasis posteriorly in the
visualized lung bases.

There is new perihepatic ascites.  Stable cyst in the posterior
right hepatic lobe.  No new liver lesion.  Gallbladder is
physiologically distended.  Unremarkable spleen, adrenal glands,
pancreas.  Stable bilateral renal cysts.  No hydronephrosis.
Unremarkable abdominal aorta.

Stomach is incompletely distended.  There are a few mildly
distended small bowel loops just proximal to an anastomotic staple
line in the mid ileum, distal small bowel decompressed. However
there is good distal passage of oral contrast material.  Moderate
fecal material in the proximal colon, decompressed distally.
Urinary bladder is distended.  Previous hysterectomy.  No free air.
No pelvic, retroperitoneal, or mesenteric adenopathy.  Portal vein
patent.  Normal bilateral renal excretion on delayed scans.

Minimal spondylitic changes in the lumbar spine.
IMPRESSION: 1.  New perihepatic ascites.
2.  Mildly distended small bowel loops just proximal to the ileal
anastomosis, suggesting a degree of partial small bowel
obstruction.

## 2012-11-10 ENCOUNTER — Ambulatory Visit (INDEPENDENT_AMBULATORY_CARE_PROVIDER_SITE_OTHER): Payer: Medicare HMO | Admitting: General Practice

## 2012-11-10 DIAGNOSIS — E539 Vitamin B deficiency, unspecified: Secondary | ICD-10-CM

## 2012-11-10 DIAGNOSIS — I4891 Unspecified atrial fibrillation: Secondary | ICD-10-CM

## 2012-11-10 DIAGNOSIS — Z7901 Long term (current) use of anticoagulants: Secondary | ICD-10-CM

## 2012-11-10 MED ORDER — CYANOCOBALAMIN 1000 MCG/ML IJ SOLN
1000.0000 ug | Freq: Once | INTRAMUSCULAR | Status: AC
  Administered 2012-11-10: 1000 ug via INTRAMUSCULAR

## 2012-11-22 ENCOUNTER — Other Ambulatory Visit: Payer: Self-pay | Admitting: Family

## 2012-11-24 ENCOUNTER — Other Ambulatory Visit: Payer: Self-pay | Admitting: General Practice

## 2012-11-24 MED ORDER — WARFARIN SODIUM 5 MG PO TABS: ORAL_TABLET | ORAL | Status: DC

## 2012-12-22 ENCOUNTER — Ambulatory Visit (INDEPENDENT_AMBULATORY_CARE_PROVIDER_SITE_OTHER): Payer: Medicare HMO | Admitting: General Practice

## 2012-12-22 DIAGNOSIS — Z7901 Long term (current) use of anticoagulants: Secondary | ICD-10-CM

## 2012-12-22 DIAGNOSIS — E539 Vitamin B deficiency, unspecified: Secondary | ICD-10-CM

## 2012-12-22 DIAGNOSIS — I4891 Unspecified atrial fibrillation: Secondary | ICD-10-CM

## 2012-12-22 LAB — POCT INR: INR: 3.5

## 2012-12-22 MED ORDER — CYANOCOBALAMIN 1000 MCG/ML IJ SOLN
1000.0000 ug | Freq: Once | INTRAMUSCULAR | Status: AC
  Administered 2012-12-22: 1000 ug via INTRAMUSCULAR

## 2012-12-22 NOTE — Progress Notes (Signed)
Pre-visit discussion using our clinic review tool. No additional management support is needed unless otherwise documented below in the visit note.  

## 2012-12-29 ENCOUNTER — Ambulatory Visit (INDEPENDENT_AMBULATORY_CARE_PROVIDER_SITE_OTHER): Payer: Medicare HMO | Admitting: Cardiovascular Disease

## 2012-12-29 ENCOUNTER — Encounter: Payer: Self-pay | Admitting: Cardiovascular Disease

## 2012-12-29 VITALS — BP 114/78 | HR 62 | Ht 66.0 in | Wt 164.0 lb

## 2012-12-29 DIAGNOSIS — I10 Essential (primary) hypertension: Secondary | ICD-10-CM

## 2012-12-29 DIAGNOSIS — I509 Heart failure, unspecified: Secondary | ICD-10-CM

## 2012-12-29 DIAGNOSIS — I4891 Unspecified atrial fibrillation: Secondary | ICD-10-CM

## 2012-12-29 DIAGNOSIS — I5022 Chronic systolic (congestive) heart failure: Secondary | ICD-10-CM

## 2012-12-29 LAB — BASIC METABOLIC PANEL
BUN: 27 mg/dL — ABNORMAL HIGH (ref 6–23)
CO2: 28 mEq/L (ref 19–32)
Calcium: 8.8 mg/dL (ref 8.4–10.5)
Chloride: 102 mEq/L (ref 96–112)
Creatinine, Ser: 1 mg/dL (ref 0.4–1.2)
GFR: 53.63 mL/min — ABNORMAL LOW (ref >60.00)
Glucose, Bld: 86 mg/dL (ref 70–99)
Potassium: 4 mEq/L (ref 3.5–5.1)
Sodium: 139 mEq/L (ref 135–145)

## 2012-12-29 MED ORDER — FUROSEMIDE 20 MG PO TABS: 60.0000 mg | ORAL_TABLET | Freq: Three times a day (TID) | ORAL | Status: DC

## 2012-12-29 MED ORDER — SPIRONOLACTONE 25 MG PO TABS: 12.5000 mg | ORAL_TABLET | Freq: Every day | ORAL | Status: DC

## 2012-12-29 MED ORDER — SPIRONOLACTONE 12.5 MG HALF TABLET: 12.5000 mg | ORAL_TABLET | Freq: Every day | ORAL | Status: DC

## 2012-12-29 NOTE — Progress Notes (Signed)
[name] Date of Birth  01/09/1926       [facility]    [facility] [address], [address]  [address], [address] [place], [place]  [id number]   [place], [place]  [id number] [phone]     [phone]   Fax  [phone]    Fax [phone]  Problem List: 1. Atrial fibrillation 2. Hypertension 3. Benign Positional Vertigo 4. Chronic systolic CHF -  Left ventricle: The cavity size was mildly dilated. Wall thickness was increased in a pattern of mild LVH. The estimated ejection fraction was 20%. Diffuse hypokinesis. - Mitral valve: Mild to moderate regurgitation. - Left atrium: The atrium was mildly dilated. - Right ventricle: Systolic function was mildly to moderately reduced. - Right atrium: The atrium was mildly dilated. - Tricuspid valve: Mild-moderate regurgitation. - Pulmonary arteries: PA peak pressure: 41mm Hg  History of Present Illness:  [name] JoyFlo is a 79 y.o. female with a history of atrial fibrillation. She was cardioverted in October, 2012. She unfortunately has gone back into atrial fibrillation.  She has felt very well. She's not having any episodes of chest pain or shortness breath. She's tolerating the rhythm quite well. Her only complaint is that she's itching from the patches were placed during the cardioversion. She also complains of having very dry and cracking skin on her hands.   Sept. 16, 2013 She has been exercising regularly.  She has gained weight since I last saw her ( 11 lbs.)  She has no complaints of dyspnea or chest pain.  March 21, 2012:  She was seen by Lorin PicketScott several weeks ago. She was scheduled have an echocardiogram but she has not had that yet.  ( was scheduled but we had a big snow storm).  She's been having more shortness breath and more leg edema.  Scar decreased her Lasix. Her leg swelling is better.  He also has some chest heaviness.  She is trying to limit her stair climbing.  April 04, 2012:  Her recent  echo shows markedly decreased LV function   Left ventricle: The cavity size was mildly dilated. Wall thickness was increased in a pattern of mild LVH. The estimated ejection fraction was 20%. Diffuse hypokinesis. - Mitral valve: Mild to moderate regurgitation. - Left atrium: The atrium was mildly dilated. - Right ventricle: Systolic function was mildly to moderately reduced. - Right atrium: The atrium was mildly dilated. - Tricuspid valve: Mild-moderate regurgitation. - Pulmonary arteries: PA peak pressure: 41mm Hg (S).   her myoivew study revealed an apical defect - ? Apical thinning.  Low risk study.  She continues to have shortness breath. Not sure that she's having any chest pain.  It's very difficult to get an accurate history of her. She seems to ramble in her conversation.  July 02, 2012:  Flow had a cardiac cath since of last year.  She developed a rash after the cath  She has nonobstructive coronary artery disease. Her ejection fraction is severely depressed with an EF of 20-25%. She continues to have atrial fibrillation with a well-controlled ventricular response. She is on chronic Coumadin therapy.  [place] a [place].   [place] [employer] [place] about 1 mile)  She stays very active.    She complains of being short of breath but she remains very active.   Dec. 22, 2014:  [name] continues to have more dyspnea.  She added an extra Lasix ( now 4  times a day).  She does not think that she's breathing any better since increasing her Lasix from 3 times a day to 4 times a day.  She still walks on a regular basis except very bad weather.    Current Outpatient Prescriptions on File Prior to Visit  Medication Sig Dispense Refill  . loratadine (CLARITIN) 10 MG tablet Take 10 mg by mouth daily.        Marland Kitchen losartan (COZAAR) 50 MG tablet TAKE ONE TABLET BY MOUTH EVERY DAY  90 tablet  3  . metoprolol (LOPRESSOR) 100 MG tablet Take 1 tablet (100 mg total) by  mouth 2 (two) times daily.  60 tablet  3  . Multiple Vitamin (MULTIVITAMIN) tablet Take 1 tablet by mouth daily.        . Multiple Vitamins-Minerals (ICAPS PO) Take 2 tablets by mouth daily.       . potassium chloride (K-DUR,KLOR-CON) 10 MEQ tablet Take 2 tablets (20 mEq total) by mouth daily.  60 tablet  11  . warfarin (COUMADIN) 5 MG tablet TAKE ONE TO ONE & ONE-HALF TABLETS BY MOUTH ONCE DAILY.TAKE AS DIRECTED BY ANTICOAGULATON CLINIC.  45 tablet  0   No current facility-administered medications on file prior to visit.    Allergies  Allergen Reactions  . Bactrim   . Codeine   . Contrast Media [Iodinated Diagnostic Agents]   . Nitrofurantoin   . Nitrofurantoin Monohyd Macro   . Penicillins   . Sulfamethoxazole-Trimethoprim     Past Medical History  Diagnosis Date  . B12 DEFICIENCY 11/25/2009  . ESSENTIAL HYPERTENSION 09/30/2009  . CHF 01/27/2009  . SMALL BOWEL OBSTRUCTION 05/05/2008  . DIVERTICULITIS OF COLON 01/27/2009  . OSTEOARTHRITIS, KNEE, RIGHT 02/08/2009  . OSTEOPENIA 01/27/2009  . Left ventricular dysfunction   . Hypertension   . Chronic systolic heart failure 07/25/2009    a. Myoview 5/12:  Low risk, no ischemia, apical lateral defect probably breast attenuation, inf HK, EF 45%.  b. Echo 8/12: EF 45-50%, inf/inf-septal and apical HK, mild LVH, mild to mod MR, mild to mod TR, PASP 50;  c.  Echo 3/14:  Mild LVH, EF 20%, diff HK, mild to mod MR, mild LAE, mild to mod reduced RVSF, mild RAE, mild to mod TR, PASP 41  . Hemorrhoids   . Rectal bleeding   . Atrial fib/flutter, transient     Past Surgical History  Procedure Laterality Date  . Colon surgery  2006    Sigmoid colectomy for diverticulitis  . Abdominal hysterectomy  1996    TAHBSO  . Appendectomy  1951  . Tonsillectomy  1966  . Cardiac catheterization  April 14, 2012    nonobstructive CAD; severe LV dysfunction.     History  Smoking status  . Never Smoker   Smokeless tobacco  . Never Used    History   Alcohol Use No    Family History  Problem Relation Age of Onset  . Heart disease    . Emphysema    . Heart disease Father   . Heart disease Sister     Reviw of Systems:  Reviewed in the HPI.  All other systems are negative.  Physical Exam: Blood pressure 114/78, pulse 62, height 5\' 6"  (1.676 m), weight 164 lb (74.39 kg). She did not take any of her medications today.  General: Well developed, well nourished, in no acute distress.  Head: Normocephalic, atraumatic, sclera non-icteric, mucus membranes are moist,   Neck: Supple. Carotids are 2 +  without bruits. No JVD  Lungs: Clear bilaterally to auscultation.  Heart: Irregularly irregular.  normal  S1 S2. No murmurs, gallops or rubs.  Abdomen: Soft, non-tender, non-distended with normal bowel sounds. No hepatomegaly. No rebound/guarding. No masses.  Msk:  Strength and tone are normal  Extremities: No clubbing or cyanosis. No edema.  Distal pedal pulses are 2+ and equal bilaterally.  Neuro: Alert and oriented X 3. Moves all extremities spontaneously.  Psych:  Responds to questions appropriately with a normal affect.  ECG:  Assessment / Plan:

## 2012-12-29 NOTE — Assessment & Plan Note (Signed)
BP is stable.  Continue current BP meds

## 2012-12-29 NOTE — Patient Instructions (Addendum)
Your physician recommends that you schedule a follow-up appointment in: 3 MONTHS WITH DR Elease Hashimoto WITH EKG    Your physician has recommended you make the following change in your medication: START ALDACTONE 12.5 MG DAILY  Your physician recommends that you return for lab work in: TODAY  Your physician recommends that you return for lab work in: 1 WEEK Your physician recommends that you return for lab work in: 1 MONTH  Your physician recommends that you return for lab work in: 3 MONTHS

## 2012-12-29 NOTE — Assessment & Plan Note (Signed)
She remains in atrial fibrillation. Her INRs therapeutic. I've encouraged her to eat some greens which will help regulate her  INR  levels. Her level was 3.5 today.  ECG at next visit.

## 2012-12-29 NOTE — Assessment & Plan Note (Addendum)
She continues to have challenges with her breathing.  As usual, it is difficult to get an accurate history from her.  We will reduce her Lasix back down to 3 times a day.  Add Spironolactone 12.5 mg a day.   We'll check a basic medical profile today, one week, one month,  I'll see her in 3 months for an office visit, basic metabolic profile and EKG.

## 2013-01-05 ENCOUNTER — Ambulatory Visit (INDEPENDENT_AMBULATORY_CARE_PROVIDER_SITE_OTHER): Payer: Medicare HMO | Admitting: General Practice

## 2013-01-05 DIAGNOSIS — Z7901 Long term (current) use of anticoagulants: Secondary | ICD-10-CM

## 2013-01-05 DIAGNOSIS — I4891 Unspecified atrial fibrillation: Secondary | ICD-10-CM

## 2013-01-05 LAB — POCT INR: INR: 2.5

## 2013-01-05 NOTE — Progress Notes (Signed)
Pre-visit discussion using our clinic review tool. No additional management support is needed unless otherwise documented below in the visit note.  

## 2013-01-07 ENCOUNTER — Other Ambulatory Visit (INDEPENDENT_AMBULATORY_CARE_PROVIDER_SITE_OTHER): Payer: Commercial Managed Care - HMO

## 2013-01-07 DIAGNOSIS — I10 Essential (primary) hypertension: Secondary | ICD-10-CM

## 2013-01-07 DIAGNOSIS — I5022 Chronic systolic (congestive) heart failure: Secondary | ICD-10-CM

## 2013-01-07 DIAGNOSIS — I509 Heart failure, unspecified: Secondary | ICD-10-CM

## 2013-01-07 LAB — BASIC METABOLIC PANEL
BUN: 23 mg/dL (ref 6–23)
Chloride: 102 mEq/L (ref 96–112)
Creatinine, Ser: 1.2 mg/dL (ref 0.4–1.2)
GFR: 47.75 mL/min — ABNORMAL LOW (ref >60.00)
Sodium: 140 mEq/L (ref 135–145)

## 2013-01-19 ENCOUNTER — Other Ambulatory Visit: Payer: Self-pay | Admitting: Cardiovascular Disease

## 2013-01-26 ENCOUNTER — Ambulatory Visit (INDEPENDENT_AMBULATORY_CARE_PROVIDER_SITE_OTHER): Payer: Medicare HMO | Admitting: *Deleted

## 2013-01-26 DIAGNOSIS — I10 Essential (primary) hypertension: Secondary | ICD-10-CM

## 2013-01-26 DIAGNOSIS — I509 Heart failure, unspecified: Secondary | ICD-10-CM

## 2013-01-26 DIAGNOSIS — I5022 Chronic systolic (congestive) heart failure: Secondary | ICD-10-CM

## 2013-01-26 LAB — BASIC METABOLIC PANEL
BUN: 24 mg/dL — AB (ref 6–23)
CALCIUM: 8.7 mg/dL (ref 8.4–10.5)
CO2: 28 meq/L (ref 19–32)
CREATININE: 1.1 mg/dL (ref 0.4–1.2)
Chloride: 103 mEq/L (ref 96–112)
GFR: 50.26 mL/min — ABNORMAL LOW (ref >60.00)
Glucose, Bld: 86 mg/dL (ref 70–99)
Potassium: 4.4 mEq/L (ref 3.5–5.1)
Sodium: 139 mEq/L (ref 135–145)

## 2013-02-02 ENCOUNTER — Encounter: Payer: Self-pay | Admitting: Family Medicine

## 2013-02-02 ENCOUNTER — Ambulatory Visit (INDEPENDENT_AMBULATORY_CARE_PROVIDER_SITE_OTHER): Payer: Medicare HMO | Admitting: Family Medicine

## 2013-02-02 ENCOUNTER — Ambulatory Visit (INDEPENDENT_AMBULATORY_CARE_PROVIDER_SITE_OTHER): Payer: Medicare HMO | Admitting: General Practice

## 2013-02-02 VITALS — BP 118/64 | HR 100 | Temp 98.4°F | Wt 164.0 lb

## 2013-02-02 DIAGNOSIS — Z7901 Long term (current) use of anticoagulants: Secondary | ICD-10-CM

## 2013-02-02 DIAGNOSIS — I4891 Unspecified atrial fibrillation: Secondary | ICD-10-CM

## 2013-02-02 DIAGNOSIS — M719 Bursopathy, unspecified: Secondary | ICD-10-CM

## 2013-02-02 DIAGNOSIS — Z5181 Encounter for therapeutic drug level monitoring: Secondary | ICD-10-CM

## 2013-02-02 DIAGNOSIS — M7580 Other shoulder lesions, unspecified shoulder: Secondary | ICD-10-CM

## 2013-02-02 DIAGNOSIS — M67919 Unspecified disorder of synovium and tendon, unspecified shoulder: Secondary | ICD-10-CM

## 2013-02-02 LAB — POCT INR: INR: 3.1

## 2013-02-02 MED ORDER — METHYLPREDNISOLONE ACETATE 40 MG/ML IJ SUSP
40.0000 mg | Freq: Once | INTRAMUSCULAR | Status: AC
  Administered 2013-02-02: 40 mg via INTRA_ARTICULAR

## 2013-02-02 NOTE — Patient Instructions (Signed)
Rotator Cuff Tendinitis  Rotator cuff tendinitis is inflammation of the tough, cord-like bands that connect muscle to bone (tendons) in your rotator cuff. Your rotator cuff is the collection of all the muscles and tendons that connect your arm to your shoulder. Your rotator cuff holds the head of your upper arm bone (humerus) in the cup (fossa) of your shoulder blade (scapula). CAUSES Rotator cuff tendinitis is usually caused by overusing the joint involved.  SIGNS AND SYMPTOMS  Deep ache in the shoulder also felt on the outside upper arm over the shoulder muscle.  Point tenderness over the area that is injured.  Pain comes on gradually and becomes worse with lifting the arm to the side (abduction) or turning it inward (internal rotation).  May lead to a chronic tear: When a rotator cuff tendon becomes inflamed, it runs the risk of losing its blood supply, causing some tendon fibers to die. This increases the risk that the tendon can fray and partially or completely tear. DIAGNOSIS Rotator cuff tendinitis is diagnosed by taking a medical history, performing a physical exam, and reviewing results of imaging exams. The medical history is useful to help determine the type of rotator cuff injury. The physical exam will include looking at the injured shoulder, feeling the injured area, and watching you do range-of-motion exercises. X-ray exams are typically done to rule out other causes of shoulder pain, such as fractures. MRI is the imaging exam usually used for significant shoulder injuries. Sometimes a dye study called CT arthrogram is done, but it is not as widely used as MRI. In some institutions, special ultrasound tests may also be used to aid in the diagnosis. TREATMENT  Less Severe Cases  Use of a sling to rest the shoulder for a short period of time. Prolonged use of the sling can cause stiffness, weakness, and loss of motion of the shoulder joint.  Anti-inflammatory medicines, such as  ibuprofen or naproxen sodium, may be prescribed. More Severe Cases  Physical therapy.  Use of steroid injections into the shoulder joint.  Surgery. HOME CARE INSTRUCTIONS   Use a sling or splint until the pain decreases. Prolonged use of the sling can cause stiffness, weakness, and loss of motion of the shoulder joint.  Apply ice to the injured area:  Put ice in a plastic bag.  Place a towel between your skin and the bag.  Leave the ice on for 20 minutes, 2 3 times a day.  Try to avoid use other than gentle range of motion while your shoulder is painful. Use the shoulder and exercise only as directed by your health care provider. Stop exercises or range of motion if pain or discomfort increases, unless directed otherwise by your health care provider.  Only take over-the-counter or prescription medicines for pain, discomfort, or fever as directed by your health care provider.  If you were given a shoulder sling and straps (immobilizer), do not remove it except as directed, or until you see a health care provider for a follow-up exam. If you need to remove it, move your arm as little as possible or as directed.  You may want to sleep on several pillows at night to lessen swelling and pain. SEEK IMMEDIATE MEDICAL CARE IF:   Your shoulder pain increases or new pain develops in your arm, hand, or fingers and is not relieved with medicines.  You have new, unexplained symptoms, especially increased numbness in the hands or loss of strength.  You develop any worsening of the   problems that brought you in for care.  Your arm, hand, or fingers are numb or tingling.  Your arm, hand, or fingers are swollen, painful, or turn white or blue. MAKE SURE YOU:  Understand these instructions.  Will watch your condition.  Will get help right away if you are not doing well or get worse. Document Released: 03/17/2003 Document Revised: 10/15/2012 Document Reviewed: 08/06/2012 [employer] Patient  Information 2014 [employer].  

## 2013-02-02 NOTE — Progress Notes (Signed)
Pre-visit discussion using our clinic review tool. No additional management support is needed unless otherwise documented below in the visit note.  

## 2013-02-02 NOTE — Progress Notes (Signed)
Pre visit review using our clinic review tool, if applicable. No additional management support is needed unless otherwise documented below in the visit note. 

## 2013-02-02 NOTE — Progress Notes (Signed)
Subjective:    Patient ID: [name], female    DOB: 04/02/1926, 80 y.o.   MRN: [id number]  HPI Left shoulder pain Onset around last May-?after fall. Also noted some pain around that time with lifting (weights with exercise). Location is diffuse left shoulder and worse with abduction-and to lesser degree with internal rotation No neck pain.  No weakness.   Minimal night pain. Pain is mild to moderate but she was concerned because not getting better over several months  She's not aware of any weakness. Denies any upper extremity numbness No alleviating factors. She has not taken any medications.  Past Medical History  Diagnosis Date  . B12 DEFICIENCY 11/25/2009  . ESSENTIAL HYPERTENSION 09/30/2009  . CHF 01/27/2009  . SMALL BOWEL OBSTRUCTION 05/05/2008  . DIVERTICULITIS OF COLON 01/27/2009  . OSTEOARTHRITIS, KNEE, RIGHT 02/08/2009  . OSTEOPENIA 01/27/2009  . Left ventricular dysfunction   . Hypertension   . Chronic systolic heart failure 07/25/2009    a. Myoview 5/12:  Low risk, no ischemia, apical lateral defect probably breast attenuation, inf HK, EF 45%.  b. Echo 8/12: EF 45-50%, inf/inf-septal and apical HK, mild LVH, mild to mod MR, mild to mod TR, PASP 50;  c.  Echo 3/14:  Mild LVH, EF 20%, diff HK, mild to mod MR, mild LAE, mild to mod reduced RVSF, mild RAE, mild to mod TR, PASP 41  . Hemorrhoids   . Rectal bleeding   . Atrial fib/flutter, transient    Past Surgical History  Procedure Laterality Date  . Colon surgery  2006    Sigmoid colectomy for diverticulitis  . Abdominal hysterectomy  1996    TAHBSO  . Appendectomy  1951  . Tonsillectomy  1966  . Cardiac catheterization  April 14, 2012    nonobstructive CAD; severe LV dysfunction.     reports that she has never smoked. She has never used smokeless tobacco. She reports that she does not drink alcohol or use illicit drugs. family history includes Emphysema in an other family member; Heart disease in her father,  sister, and another family member. Allergies  Allergen Reactions  . Bactrim   . Codeine   . Contrast Media [Iodinated Diagnostic Agents]   . Nitrofurantoin   . Nitrofurantoin Monohyd Macro   . Penicillins   . Sulfamethoxazole-Trimethoprim       Review of Systems  Respiratory: Negative for shortness of breath.   Cardiovascular: Negative for chest pain.  Skin: Negative for rash.  Neurological: Negative for weakness and numbness.       Objective:   Physical Exam  Constitutional: She appears well-developed and well-nourished.  Cardiovascular: Normal rate.   Pulmonary/Chest: Effort normal and breath sounds normal. No respiratory distress. She has no wheezes. She has no rales.  Musculoskeletal:  Left shoulder reveals no specific point tenderness. She has pain with abduction greater than 90 and also pain with internal rotation. No biceps tenderness. No a.c. joint tenderness.  Neurological:  Her testing is limited by pain more than true weakness. Normal sensory function          Assessment & Plan:  Left shoulder pain of several months duration. Suspect rotator cuff tendinitis.   Discussed risks and benefits of corticosteroid injection and patient consented.  After prepping skin with betadine, injected 40 mg depomedrol and 2 cc of plain xylocaine with 23 gauge one and one half inch needle using posterior lateral approach and pt tolerated well. We instructed her in gentle range of motion exercises.  We've recommended consideration for physical therapy but she is not interested in this time. Touch base in 2 weeks if not steadily improved

## 2013-03-02 ENCOUNTER — Ambulatory Visit (INDEPENDENT_AMBULATORY_CARE_PROVIDER_SITE_OTHER): Payer: Medicare HMO | Admitting: General Practice

## 2013-03-02 DIAGNOSIS — I4891 Unspecified atrial fibrillation: Secondary | ICD-10-CM

## 2013-03-02 DIAGNOSIS — Z7901 Long term (current) use of anticoagulants: Secondary | ICD-10-CM

## 2013-03-02 LAB — POCT INR: INR: 3.5

## 2013-03-02 NOTE — Progress Notes (Signed)
Pre visit review using our clinic review tool, if applicable. No additional management support is needed unless otherwise documented below in the visit note. 

## 2013-03-20 ENCOUNTER — Other Ambulatory Visit: Payer: Self-pay

## 2013-03-20 ENCOUNTER — Other Ambulatory Visit: Payer: Self-pay | Admitting: Cardiovascular Disease

## 2013-03-20 DIAGNOSIS — Z1231 Encounter for screening mammogram for malignant neoplasm of breast: Secondary | ICD-10-CM

## 2013-03-21 ENCOUNTER — Other Ambulatory Visit: Payer: Self-pay | Admitting: Physician Assistant

## 2013-03-30 ENCOUNTER — Ambulatory Visit (INDEPENDENT_AMBULATORY_CARE_PROVIDER_SITE_OTHER): Payer: Medicare HMO | Admitting: General Practice

## 2013-03-30 ENCOUNTER — Other Ambulatory Visit: Payer: Self-pay | Admitting: General Practice

## 2013-03-30 DIAGNOSIS — I4891 Unspecified atrial fibrillation: Secondary | ICD-10-CM

## 2013-03-30 DIAGNOSIS — Z5181 Encounter for therapeutic drug level monitoring: Secondary | ICD-10-CM

## 2013-03-30 LAB — POCT INR: INR: 2.7

## 2013-03-30 NOTE — Progress Notes (Signed)
Pre visit review using our clinic review tool, if applicable. No additional management support is needed unless otherwise documented below in the visit note. 

## 2013-03-31 ENCOUNTER — Ambulatory Visit: Payer: Medicare HMO | Admitting: Cardiovascular Disease

## 2013-03-31 ENCOUNTER — Encounter: Payer: Self-pay | Admitting: Gastroenterology

## 2013-04-08 ENCOUNTER — Ambulatory Visit
Admission: RE | Admit: 2013-04-08 | Discharge: 2013-04-08 | Disposition: A | Discharge status: Home / Self Care | Payer: Commercial Managed Care - HMO | Source: Ambulatory Visit

## 2013-04-08 DIAGNOSIS — Z1231 Encounter for screening mammogram for malignant neoplasm of breast: Secondary | ICD-10-CM

## 2013-04-10 ENCOUNTER — Ambulatory Visit (INDEPENDENT_AMBULATORY_CARE_PROVIDER_SITE_OTHER): Payer: Medicare HMO | Admitting: Cardiovascular Disease

## 2013-04-10 ENCOUNTER — Encounter: Payer: Self-pay | Admitting: Cardiovascular Disease

## 2013-04-10 VITALS — BP 126/73 | HR 82 | Ht 66.5 in | Wt 160.0 lb

## 2013-04-10 DIAGNOSIS — I4891 Unspecified atrial fibrillation: Secondary | ICD-10-CM

## 2013-04-10 DIAGNOSIS — I5022 Chronic systolic (congestive) heart failure: Secondary | ICD-10-CM

## 2013-04-10 DIAGNOSIS — I10 Essential (primary) hypertension: Secondary | ICD-10-CM

## 2013-04-10 DIAGNOSIS — I509 Heart failure, unspecified: Secondary | ICD-10-CM

## 2013-04-10 LAB — BASIC METABOLIC PANEL
BUN: 28 mg/dL — AB (ref 6–23)
CO2: 21 mEq/L (ref 19–32)
Calcium: 8.7 mg/dL (ref 8.4–10.5)
Chloride: 104 mEq/L (ref 96–112)
Creat: 1.13 mg/dL — ABNORMAL HIGH (ref 0.50–1.10)
GLUCOSE: 90 mg/dL (ref 70–99)
POTASSIUM: 4.3 meq/L (ref 3.5–5.3)
Sodium: 136 mEq/L (ref 135–145)

## 2013-04-10 MED ORDER — FUROSEMIDE 20 MG PO TABS: 60.0000 mg | ORAL_TABLET | Freq: Two times a day (BID) | ORAL | Status: DC

## 2013-04-10 NOTE — Assessment & Plan Note (Signed)
She is stable atrial fibrillation. Her rate is well controlled.

## 2013-04-10 NOTE — Patient Instructions (Signed)
Your physician recommends that you return for lab work in: TODAY   Your physician has recommended you make the following change in your medication:  Decrease lasix to 60 mg twice daily   Your physician recommends that you return for lab work in: 1 month at [facility]/ order in system  Your physician wants you to follow-up in: 6 months You will receive a reminder letter in the mail two months in advance. If you don't receive a letter, please call our office to schedule the follow-up appointment.  Your physician recommends that you return for lab work in: 6 months/ [facility]

## 2013-04-10 NOTE — Progress Notes (Signed)
[name] EstelleFlorence Kassin Date of Birth  01/09/1926       [facility]    [facility] [address], [address]  [address], [address] [place], [place]  [id number]   [place], [place]  [id number] [phone]     [phone]   Fax  [phone]    Fax [phone]  Problem List: 1. Atrial fibrillation 2. Hypertension 3. Benign Positional Vertigo 4. Chronic systolic CHF -  Left ventricle: The cavity size was mildly dilated. Wall thickness was increased in a pattern of mild LVH. The estimated ejection fraction was 20%. Diffuse hypokinesis. - Mitral valve: Mild to moderate regurgitation. - Left atrium: The atrium was mildly dilated. - Right ventricle: Systolic function was mildly to moderately reduced. - Right atrium: The atrium was mildly dilated. - Tricuspid valve: Mild-moderate regurgitation. - Pulmonary arteries: PA peak pressure: 41mm Hg  History of Present Illness:  [name] JoyFlo is a 80 y.o. female with a history of atrial fibrillation. She was cardioverted in October, 2012. She unfortunately has gone back into atrial fibrillation.  She has felt very well. She's not having any episodes of chest pain or shortness breath. She's tolerating the rhythm quite well. Her only complaint is that she's itching from the patches were placed during the cardioversion. She also complains of having very dry and cracking skin on her hands.   Sept. 16, 2013 She has been exercising regularly.  She has gained weight since I last saw her ( 11 lbs.)  She has no complaints of dyspnea or chest pain.  March 21, 2012:  She was seen by Lorin PicketScott several weeks ago. She was scheduled have an echocardiogram but she has not had that yet.  ( was scheduled but we had a big snow storm).  She's been having more shortness breath and more leg edema.  Scar decreased her Lasix. Her leg swelling is better.  He also has some chest heaviness.  She is trying to limit her stair climbing.  April 04, 2012:  Her recent  echo shows markedly decreased LV function   Left ventricle: The cavity size was mildly dilated. Wall thickness was increased in a pattern of mild LVH. The estimated ejection fraction was 20%. Diffuse hypokinesis. - Mitral valve: Mild to moderate regurgitation. - Left atrium: The atrium was mildly dilated. - Right ventricle: Systolic function was mildly to moderately reduced. - Right atrium: The atrium was mildly dilated. - Tricuspid valve: Mild-moderate regurgitation. - Pulmonary arteries: PA peak pressure: 41mm Hg (S).   her myoivew study revealed an apical defect - ? Apical thinning.  Low risk study.  She continues to have shortness breath. Not sure that she's having any chest pain.  It's very difficult to get an accurate history of her. She seems to ramble in her conversation.  July 02, 2012:  Flow had a cardiac cath since of last year.  She developed a rash after the cath  She has nonobstructive coronary artery disease. Her ejection fraction is severely depressed with an EF of 20-25%. She continues to have atrial fibrillation with a well-controlled ventricular response. She is on chronic Coumadin therapy.  She goes to the [place] twice a week.   [place] [employer] [place] about 1 mile)  She stays very active.    She complains of being short of breath but she remains very active.   Dec. 22, 2014:  [name] continues to have more dyspnea.  She added an extra Lasix ( now 4  times a day).  She does not think that she's breathing any better since increasing her Lasix from 3 times a day to 4 times a day.  She still walks on a regular basis except very bad weather.    04/10/2013:  [name] has done well.  She had a brief episode of sharp CP - lasted 15 seconds.   She has not been going to the gym.  She is exercising in the AM to a CD.   Able to workout without any CP or dyspnea.   Current Outpatient Prescriptions on File Prior to Visit  Medication Sig Dispense Refill  . furosemide  (LASIX) 20 MG tablet Take 3 tablets (60 mg total) by mouth 3 (three) times daily.  90 tablet  6  . KLOR-CON M10 10 MEQ tablet TAKE TWO TABLETS BY MOUTH ONCE DAILY  60 tablet  0  . loratadine (CLARITIN) 10 MG tablet Take 10 mg by mouth daily.        Marland Kitchen losartan (COZAAR) 50 MG tablet TAKE ONE TABLET BY MOUTH EVERY DAY  90 tablet  3  . metoprolol (LOPRESSOR) 100 MG tablet TAKE ONE TABLET BY MOUTH TWICE DAILY  60 tablet  0  . Multiple Vitamin (MULTIVITAMIN) tablet Take 1 tablet by mouth daily.        . Multiple Vitamins-Minerals (ICAPS PO) Take 2 tablets by mouth daily.       Marland Kitchen spironolactone (ALDACTONE) 25 MG tablet Take 0.5 tablets (12.5 mg total) by mouth daily.  90 tablet  3  . warfarin (COUMADIN) 5 MG tablet TAKE ONE TO ONE & ONE-HALF TABLETS BY MOUTH ONCE DAILY.TAKE AS DIRECTED BY [facility]TICOAGULATON CLINIC.  45 tablet  0   No current facility-administered medications on file prior to visit.    Allergies  Allergen Reactions  . Bactrim   . Codeine   . Contrast Media [Iodinated Diagnostic Agents]   . Nitrofurantoin   . Nitrofurantoin Monohyd Macro   . Penicillins   . Sulfamethoxazole-Trimethoprim     Past Medical History  Diagnosis Date  . B12 DEFICIENCY 11/25/2009  . ESSENTIAL HYPERTENSION 09/30/2009  . CHF 01/27/2009  . SMALL BOWEL OBSTRUCTION 05/05/2008  . DIVERTICULITIS OF COLON 01/27/2009  . OSTEOARTHRITIS, KNEE, RIGHT 02/08/2009  . OSTEOPENIA 01/27/2009  . Left ventricular dysfunction   . Hypertension   . Chronic systolic heart failure 07/25/2009    a. Myoview 5/12:  Low risk, no ischemia, apical lateral defect probably breast attenuation, inf HK, EF 45%.  b. Echo 8/12: EF 45-50%, inf/inf-septal and apical HK, mild LVH, mild to mod MR, mild to mod TR, PASP 50;  c.  Echo 3/14:  Mild LVH, EF 20%, diff HK, mild to mod MR, mild LAE, mild to mod reduced RVSF, mild RAE, mild to mod TR, PASP 41  . Hemorrhoids   . Rectal bleeding   . Atrial fib/flutter, transient     Past Surgical History   Procedure Laterality Date  . Colon surgery  2006    Sigmoid colectomy for diverticulitis  . Abdominal hysterectomy  1996    TAHBSO  . Appendectomy  1951  . Tonsillectomy  1966  . Cardiac catheterization  April 14, 2012    nonobstructive CAD; severe LV dysfunction.     History  Smoking status  . Never Smoker   Smokeless tobacco  . Never Used    History  Alcohol Use No    Family History  Problem Relation Age of Onset  . Heart disease    .  Emphysema    . Heart disease Father   . Heart disease Sister     Reviw of Systems:  Reviewed in the HPI.  All other systems are negative.  Physical Exam: Blood pressure 126/73, pulse 82, height 5' 6.5" (1.689 m), weight 160 lb (72.576 kg). She did not take any of her medications today.  General: Well developed, well nourished, in no acute distress.  Head: Normocephalic, atraumatic, sclera non-icteric, mucus membranes are moist,   Neck: Supple. Carotids are 2 + without bruits. No JVD  Lungs: Clear bilaterally to auscultation.  Heart: Irregularly irregular.  normal  S1 S2. No murmurs, gallops or rubs.  Abdomen: Soft, non-tender, non-distended with normal bowel sounds. No hepatomegaly. No rebound/guarding. No masses.  Msk:  Strength and tone are normal  Extremities: No clubbing or cyanosis. No edema.  Distal pedal pulses are 2+ and equal bilaterally.  Neuro: Alert and oriented X 3. Moves all extremities spontaneously.  Psych:  Responds to questions appropriately with a normal affect.  ECG: 04/10/2013: Atrial fibrillation with a rate of 82. She has nonspecific ST and T wave changes. Assessment / Plan:

## 2013-04-10 NOTE — Assessment & Plan Note (Signed)
Her heart failure seems to be well-controlled. She's taking Lasix 3 times a day. I don't think that she needs  that much Lasix. We will reduce her Lasix to 60 mg twice a day from 3 times a day. We'll check a basic metabolic profile today.  We will have her go to [place] [facility] [place]  to check a basic metabolic profile in about one month. This will be to followup with her change and Lasix therapy. I'll see her in 6 months for followup visit and basic metabolic profile.

## 2013-04-17 ENCOUNTER — Other Ambulatory Visit: Payer: Self-pay | Admitting: Cardiovascular Disease

## 2013-04-20 ENCOUNTER — Other Ambulatory Visit: Payer: Self-pay | Admitting: Cardiovascular Disease

## 2013-04-27 ENCOUNTER — Other Ambulatory Visit: Payer: Commercial Managed Care - HMO

## 2013-04-27 ENCOUNTER — Ambulatory Visit (INDEPENDENT_AMBULATORY_CARE_PROVIDER_SITE_OTHER): Payer: Medicare HMO | Admitting: General Practice

## 2013-04-27 ENCOUNTER — Other Ambulatory Visit: Payer: Self-pay | Admitting: Family Medicine

## 2013-04-27 ENCOUNTER — Other Ambulatory Visit: Payer: Self-pay | Admitting: General Practice

## 2013-04-27 DIAGNOSIS — Z5181 Encounter for therapeutic drug level monitoring: Secondary | ICD-10-CM

## 2013-04-27 DIAGNOSIS — I5022 Chronic systolic (congestive) heart failure: Secondary | ICD-10-CM

## 2013-04-27 DIAGNOSIS — I4891 Unspecified atrial fibrillation: Secondary | ICD-10-CM

## 2013-04-27 DIAGNOSIS — I10 Essential (primary) hypertension: Secondary | ICD-10-CM

## 2013-04-27 DIAGNOSIS — Z7901 Long term (current) use of anticoagulants: Secondary | ICD-10-CM

## 2013-04-27 LAB — BASIC METABOLIC PANEL
BUN: 22 mg/dL (ref 6–23)
CHLORIDE: 104 meq/L (ref 96–112)
CO2: 29 mEq/L (ref 19–32)
CREATININE: 1 mg/dL (ref 0.4–1.2)
Calcium: 9 mg/dL (ref 8.4–10.5)
GFR: 55.43 mL/min — ABNORMAL LOW (ref >60.00)
Glucose, Bld: 109 mg/dL — ABNORMAL HIGH (ref 70–99)
Potassium: 4.6 mEq/L (ref 3.5–5.1)
SODIUM: 142 meq/L (ref 135–145)

## 2013-04-27 LAB — POCT INR: INR: 2.1

## 2013-04-27 NOTE — Progress Notes (Signed)
Pre visit review using our clinic review tool, if applicable. No additional management support is needed unless otherwise documented below in the visit note. 

## 2013-04-28 ENCOUNTER — Telehealth: Payer: Self-pay | Admitting: Cardiovascular Disease

## 2013-04-28 ENCOUNTER — Telehealth: Payer: Self-pay | Admitting: Family Medicine

## 2013-04-28 NOTE — Telephone Encounter (Signed)
Reviewed lab results with patient who verbalized understanding and gratitude.  

## 2013-04-28 NOTE — Telephone Encounter (Signed)
Relevant patient education mailed to patient.  

## 2013-04-28 NOTE — Telephone Encounter (Signed)
New message ° ° ° ° °Want test results °

## 2013-05-19 ENCOUNTER — Other Ambulatory Visit: Payer: Self-pay | Admitting: Cardiovascular Disease

## 2013-05-25 ENCOUNTER — Telehealth: Payer: Self-pay | Admitting: Family Medicine

## 2013-05-25 ENCOUNTER — Other Ambulatory Visit (INDEPENDENT_AMBULATORY_CARE_PROVIDER_SITE_OTHER): Payer: Medicare HMO

## 2013-05-25 ENCOUNTER — Ambulatory Visit (INDEPENDENT_AMBULATORY_CARE_PROVIDER_SITE_OTHER): Payer: Medicare HMO | Admitting: General Practice

## 2013-05-25 DIAGNOSIS — Z5181 Encounter for therapeutic drug level monitoring: Secondary | ICD-10-CM

## 2013-05-25 DIAGNOSIS — I5022 Chronic systolic (congestive) heart failure: Secondary | ICD-10-CM

## 2013-05-25 DIAGNOSIS — Z7901 Long term (current) use of anticoagulants: Secondary | ICD-10-CM

## 2013-05-25 DIAGNOSIS — I4891 Unspecified atrial fibrillation: Secondary | ICD-10-CM

## 2013-05-25 DIAGNOSIS — I10 Essential (primary) hypertension: Secondary | ICD-10-CM

## 2013-05-25 DIAGNOSIS — I509 Heart failure, unspecified: Secondary | ICD-10-CM

## 2013-05-25 LAB — POCT INR: INR: 2.7

## 2013-05-25 LAB — BASIC METABOLIC PANEL
BUN: 22 mg/dL (ref 6–23)
CO2: 30 meq/L (ref 19–32)
Calcium: 9.1 mg/dL (ref 8.4–10.5)
Chloride: 105 mEq/L (ref 96–112)
Creatinine, Ser: 1.1 mg/dL (ref 0.4–1.2)
GFR: 49.19 mL/min — AB (ref >60.00)
Glucose, Bld: 94 mg/dL (ref 70–99)
Potassium: 4.8 mEq/L (ref 3.5–5.1)
SODIUM: 141 meq/L (ref 135–145)

## 2013-05-25 NOTE — Progress Notes (Signed)
Pre visit review using our clinic review tool, if applicable. No additional management support is needed unless otherwise documented below in the visit note. 

## 2013-05-25 NOTE — Telephone Encounter (Signed)
Pt would like  Dr Caryl NeverBurchette to squeeze her in today she said he know who she is can you check on this for me. She has tendonitis in left arm

## 2013-05-26 NOTE — Telephone Encounter (Signed)
Left message for patient to return call.

## 2013-05-26 NOTE — Telephone Encounter (Signed)
Pt will  Be seen 5/20 at 9:45am. Pt is aware

## 2013-05-27 ENCOUNTER — Encounter: Payer: Self-pay | Admitting: Family Medicine

## 2013-05-27 ENCOUNTER — Ambulatory Visit (INDEPENDENT_AMBULATORY_CARE_PROVIDER_SITE_OTHER): Payer: Medicare HMO | Admitting: Family Medicine

## 2013-05-27 VITALS — BP 122/70 | HR 87 | Wt 165.0 lb

## 2013-05-27 DIAGNOSIS — M7582 Other shoulder lesions, left shoulder: Secondary | ICD-10-CM

## 2013-05-27 DIAGNOSIS — M67919 Unspecified disorder of synovium and tendon, unspecified shoulder: Secondary | ICD-10-CM

## 2013-05-27 DIAGNOSIS — M779 Enthesopathy, unspecified: Secondary | ICD-10-CM

## 2013-05-27 DIAGNOSIS — M719 Bursopathy, unspecified: Secondary | ICD-10-CM

## 2013-05-27 MED ORDER — METHYLPREDNISOLONE ACETATE 40 MG/ML IJ SUSP
40.0000 mg | Freq: Once | INTRAMUSCULAR | Status: AC
  Administered 2013-05-27: 40 mg via INTRA_ARTICULAR

## 2013-05-27 NOTE — Patient Instructions (Signed)
Rotator Cuff Tendinitis  Rotator cuff tendinitis is inflammation of the tough, cord-like bands that connect muscle to bone (tendons) in your rotator cuff. Your rotator cuff is the collection of all the muscles and tendons that connect your arm to your shoulder. Your rotator cuff holds the head of your upper arm bone (humerus) in the cup (fossa) of your shoulder blade (scapula). CAUSES Rotator cuff tendinitis is usually caused by overusing the joint involved.  SIGNS AND SYMPTOMS  Deep ache in the shoulder also felt on the outside upper arm over the shoulder muscle.  Point tenderness over the area that is injured.  Pain comes on gradually and becomes worse with lifting the arm to the side (abduction) or turning it inward (internal rotation).  May lead to a chronic tear: When a rotator cuff tendon becomes inflamed, it runs the risk of losing its blood supply, causing some tendon fibers to die. This increases the risk that the tendon can fray and partially or completely tear. DIAGNOSIS Rotator cuff tendinitis is diagnosed by taking a medical history, performing a physical exam, and reviewing results of imaging exams. The medical history is useful to help determine the type of rotator cuff injury. The physical exam will include looking at the injured shoulder, feeling the injured area, and watching you do range-of-motion exercises. X-ray exams are typically done to rule out other causes of shoulder pain, such as fractures. MRI is the imaging exam usually used for significant shoulder injuries. Sometimes a dye study called CT arthrogram is done, but it is not as widely used as MRI. In some institutions, special ultrasound tests may also be used to aid in the diagnosis. TREATMENT  Less Severe Cases  Use of a sling to rest the shoulder for a short period of time. Prolonged use of the sling can cause stiffness, weakness, and loss of motion of the shoulder joint.  Anti-inflammatory medicines, such as  ibuprofen or naproxen sodium, may be prescribed. More Severe Cases  Physical therapy.  Use of steroid injections into the shoulder joint.  Surgery. HOME CARE INSTRUCTIONS   Use a sling or splint until the pain decreases. Prolonged use of the sling can cause stiffness, weakness, and loss of motion of the shoulder joint.  Apply ice to the injured area:  Put ice in a plastic bag.  Place a towel between your skin and the bag.  Leave the ice on for 20 minutes, 2 3 times a day.  Try to avoid use other than gentle range of motion while your shoulder is painful. Use the shoulder and exercise only as directed by your health care provider. Stop exercises or range of motion if pain or discomfort increases, unless directed otherwise by your health care provider.  Only take over-the-counter or prescription medicines for pain, discomfort, or fever as directed by your health care provider.  If you were given a shoulder sling and straps (immobilizer), do not remove it except as directed, or until you see a health care provider for a follow-up exam. If you need to remove it, move your arm as little as possible or as directed.  You may want to sleep on several pillows at night to lessen swelling and pain. SEEK IMMEDIATE MEDICAL CARE IF:   Your shoulder pain increases or new pain develops in your arm, hand, or fingers and is not relieved with medicines.  You have new, unexplained symptoms, especially increased numbness in the hands or loss of strength.  You develop any worsening of the   problems that brought you in for care.  Your arm, hand, or fingers are numb or tingling.  Your arm, hand, or fingers are swollen, painful, or turn white or blue. MAKE SURE YOU:  Understand these instructions.  Will watch your condition.  Will get help right away if you are not doing well or get worse. Document Released: 03/17/2003 Document Revised: 10/15/2012 Document Reviewed: 08/06/2012 [employer] Patient  Information 2014 [employer].  

## 2013-05-27 NOTE — Progress Notes (Signed)
Pre visit review using our clinic review tool, if applicable. No additional management support is needed unless otherwise documented below in the visit note. 

## 2013-05-27 NOTE — Progress Notes (Signed)
   Subjective:    Patient ID: [name], female    DOB: 11/08/1926, 80 y.o.   MRN: [id number]  HPI Patient here with recurrent left shoulder pain. No recent injury. She has pain with internal rotation and abduction. She was seen in January and received corticosteroid injection. That did seem to help for several months. She is requesting consideration for repeat injection. She does have occasional cervical neck pains but no classic radiculopathy symptoms. No numbness or weakness upper extremity. She remains quite active. She does do some low weights with dumbbells. Past Medical History  Diagnosis Date  . B12 DEFICIENCY 11/25/2009  . ESSENTIAL HYPERTENSION 09/30/2009  . CHF 01/27/2009  . SMALL BOWEL OBSTRUCTION 05/05/2008  . DIVERTICULITIS OF COLON 01/27/2009  . OSTEOARTHRITIS, KNEE, RIGHT 02/08/2009  . OSTEOPENIA 01/27/2009  . Left ventricular dysfunction   . Hypertension   . Chronic systolic heart failure 07/25/2009    a. Myoview 5/12:  Low risk, no ischemia, apical lateral defect probably breast attenuation, inf HK, EF 45%.  b. Echo 8/12: EF 45-50%, inf/inf-septal and apical HK, mild LVH, mild to mod MR, mild to mod TR, PASP 50;  c.  Echo 3/14:  Mild LVH, EF 20%, diff HK, mild to mod MR, mild LAE, mild to mod reduced RVSF, mild RAE, mild to mod TR, PASP 41  . Hemorrhoids   . Rectal bleeding   . Atrial fib/flutter, transient    Past Surgical History  Procedure Laterality Date  . Colon surgery  2006    Sigmoid colectomy for diverticulitis  . Abdominal hysterectomy  1996    TAHBSO  . Appendectomy  1951  . Tonsillectomy  1966  . Cardiac catheterization  April 14, 2012    nonobstructive CAD; severe LV dysfunction.     reports that she has never smoked. She has never used smokeless tobacco. She reports that she does not drink alcohol or use illicit drugs. family history includes Emphysema in an other family member; Heart disease in her father, sister, and another family member. Allergies    Allergen Reactions  . Bactrim   . Codeine   . Contrast Media [Iodinated Diagnostic Agents]   . Nitrofurantoin   . Nitrofurantoin Monohyd Macro   . Penicillins   . Sulfamethoxazole-Trimethoprim        Review of Systems  Neurological: Negative for weakness and numbness.       Objective:   Physical Exam  Constitutional: She appears well-developed and well-nourished.  Cardiovascular: Normal rate.   Irregularly irregular rhythm  Pulmonary/Chest: Effort normal and breath sounds normal. No respiratory distress. She has no wheezes. She has no rales.  Musculoskeletal: She exhibits no edema.  Shoulder symmetric in appearance. Left shoulder reveals pain with abduction greater than 90 and internal rotation. No biceps tenderness. No a.c. joint tenderness.  Neurological:  Full-strength upper extremities. Normal sensory function. Normal distal pulses.          Assessment & Plan:  Left shoulder pain. Suspect recurrent tendinitis/bursitis. No evidence for adhesive capsulitis at this time. Offered corticosteroid injection.  Discussed risks and benefits of corticosteroid injection and patient consented.  After prepping skin with betadine, injected 40 mg depomedrol and 2 cc of plain xylocaine with 23 gauge one and one half inch needle using posterior lateral approach and pt tolerated well. We discussed that could not inject > 3 times in one year.  Consider PT if no better with the above.

## 2013-06-22 ENCOUNTER — Ambulatory Visit (INDEPENDENT_AMBULATORY_CARE_PROVIDER_SITE_OTHER): Payer: Commercial Managed Care - HMO | Admitting: General Practice

## 2013-06-22 DIAGNOSIS — I4891 Unspecified atrial fibrillation: Secondary | ICD-10-CM

## 2013-06-22 DIAGNOSIS — Z7901 Long term (current) use of anticoagulants: Secondary | ICD-10-CM

## 2013-06-22 DIAGNOSIS — Z5181 Encounter for therapeutic drug level monitoring: Secondary | ICD-10-CM

## 2013-06-22 LAB — POCT INR: INR: 2.6

## 2013-06-22 NOTE — Progress Notes (Signed)
Pre visit review using our clinic review tool, if applicable. No additional management support is needed unless otherwise documented below in the visit note. 

## 2013-07-07 ENCOUNTER — Other Ambulatory Visit: Payer: Self-pay | Admitting: Cardiovascular Disease

## 2013-07-13 ENCOUNTER — Telehealth: Payer: Self-pay | Admitting: Cardiovascular Disease

## 2013-07-13 NOTE — Telephone Encounter (Signed)
New message    Patient calling need clarification direction / dosage of medication.

## 2013-07-13 NOTE — Telephone Encounter (Signed)
Spoke with patient who states she is confused about her Furosemide dosage.  I reviewed patient's chart to verify that patient is prescribed Furosemide 60 mg (3 tablets) BID.  Patient verbalizes confusion on this dose and so I repeated it and had her write it down until she was able to recite it back to me correctly.  Patient verbalized understanding and thanked me for the call.

## 2013-07-20 ENCOUNTER — Ambulatory Visit: Payer: Commercial Managed Care - HMO

## 2013-07-23 NOTE — Telephone Encounter (Signed)
Close encounter 

## 2013-07-27 ENCOUNTER — Ambulatory Visit: Payer: Commercial Managed Care - HMO

## 2013-08-03 ENCOUNTER — Ambulatory Visit (INDEPENDENT_AMBULATORY_CARE_PROVIDER_SITE_OTHER): Payer: Commercial Managed Care - HMO | Admitting: General Practice

## 2013-08-03 DIAGNOSIS — Z5181 Encounter for therapeutic drug level monitoring: Secondary | ICD-10-CM

## 2013-08-03 DIAGNOSIS — Z7901 Long term (current) use of anticoagulants: Secondary | ICD-10-CM

## 2013-08-03 DIAGNOSIS — I4891 Unspecified atrial fibrillation: Secondary | ICD-10-CM

## 2013-08-03 LAB — POCT INR: INR: 2.4

## 2013-08-03 NOTE — Progress Notes (Signed)
Pre visit review using our clinic review tool, if applicable. No additional management support is needed unless otherwise documented below in the visit note. 

## 2013-08-20 ENCOUNTER — Other Ambulatory Visit: Payer: Self-pay | Admitting: Cardiovascular Disease

## 2013-08-31 ENCOUNTER — Ambulatory Visit: Payer: Commercial Managed Care - HMO

## 2013-09-02 ENCOUNTER — Ambulatory Visit (INDEPENDENT_AMBULATORY_CARE_PROVIDER_SITE_OTHER): Payer: Commercial Managed Care - HMO | Admitting: Family

## 2013-09-02 DIAGNOSIS — I4891 Unspecified atrial fibrillation: Secondary | ICD-10-CM

## 2013-09-02 DIAGNOSIS — I482 Chronic atrial fibrillation, unspecified: Secondary | ICD-10-CM

## 2013-09-02 DIAGNOSIS — Z5181 Encounter for therapeutic drug level monitoring: Secondary | ICD-10-CM

## 2013-09-02 LAB — POCT INR: INR: 2.4

## 2013-09-02 NOTE — Patient Instructions (Signed)
Continue to take 1 pill everyday except 1/2 pill on Mondays/Wednesday /Fridays. Recheck in 4 weeks..  Anticoagulation Dose Instructions as of 09/02/2013     [name] Tue Wed Thu Fri Sat   New Dose 5 mg 2.5 mg 5 mg 2.5 mg 5 mg 2.5 mg 5 mg    Description       Continue to take 1 pill everyday except 1/2 pill on Mondays/Wednesday /Fridays. Recheck in 4 weeks.Marland Kitchen

## 2013-09-09 ENCOUNTER — Encounter: Payer: Self-pay | Admitting: Family Medicine

## 2013-09-09 ENCOUNTER — Ambulatory Visit (INDEPENDENT_AMBULATORY_CARE_PROVIDER_SITE_OTHER): Payer: Commercial Managed Care - HMO | Admitting: Family Medicine

## 2013-09-09 VITALS — BP 130/74 | HR 100 | Temp 98.5°F | Wt 157.0 lb

## 2013-09-09 DIAGNOSIS — M25519 Pain in unspecified shoulder: Secondary | ICD-10-CM

## 2013-09-09 DIAGNOSIS — S93402A Sprain of unspecified ligament of left ankle, initial encounter: Secondary | ICD-10-CM

## 2013-09-09 DIAGNOSIS — I4891 Unspecified atrial fibrillation: Secondary | ICD-10-CM

## 2013-09-09 DIAGNOSIS — M25512 Pain in left shoulder: Secondary | ICD-10-CM

## 2013-09-09 DIAGNOSIS — S93409A Sprain of unspecified ligament of unspecified ankle, initial encounter: Secondary | ICD-10-CM

## 2013-09-09 NOTE — Progress Notes (Signed)
Pre visit review using our clinic review tool, if applicable. No additional management support is needed unless otherwise documented below in the visit note. 

## 2013-09-09 NOTE — Progress Notes (Signed)
Subjective:    Patient ID: [name], female    DOB: 15-Aug-1926, 80 y.o.   MRN: [id number]  HPI Patient seen for the following issues  Left ankle sprain. Occurred about 9 weeks ago. Inversion type injury. Still has some lateral tenderness. No edema. No instability. Still walking several times per day.  Ongoing left shoulder pain. Did improve temporarily with injection. She has good range of motion but especially pain with internal rotation. No weakness  She has atrial fibrillation and history of heart failure. She is ambulating without much difficulty still some mild shortness of breath with extreme exertion. No chest pains. She has questions regarding switching from Coumadin to another once daily oral anticoagulant. She's not sure how this would weigh out for cost issues.  Past Medical History  Diagnosis Date  . B12 DEFICIENCY 11/25/2009  . ESSENTIAL HYPERTENSION 09/30/2009  . CHF 01/27/2009  . SMALL BOWEL OBSTRUCTION 05/05/2008  . DIVERTICULITIS OF COLON 01/27/2009  . OSTEOARTHRITIS, KNEE, RIGHT 02/08/2009  . OSTEOPENIA 01/27/2009  . Left ventricular dysfunction   . Hypertension   . Chronic systolic heart failure 07/25/2009    a. Myoview 5/12:  Low risk, no ischemia, apical lateral defect probably breast attenuation, inf HK, EF 45%.  b. Echo 8/12: EF 45-50%, inf/inf-septal and apical HK, mild LVH, mild to mod MR, mild to mod TR, PASP 50;  c.  Echo 3/14:  Mild LVH, EF 20%, diff HK, mild to mod MR, mild LAE, mild to mod reduced RVSF, mild RAE, mild to mod TR, PASP 41  . Hemorrhoids   . Rectal bleeding   . Atrial fib/flutter, transient    Past Surgical History  Procedure Laterality Date  . Colon surgery  2006    Sigmoid colectomy for diverticulitis  . Abdominal hysterectomy  1996    TAHBSO  . Appendectomy  1951  . Tonsillectomy  1966  . Cardiac catheterization  April 14, 2012    nonobstructive CAD; severe LV dysfunction.     reports that she has never smoked. She has never  used smokeless tobacco. She reports that she does not drink alcohol or use illicit drugs. family history includes Emphysema in an other family member; Heart disease in her father, sister, and another family member. Allergies  Allergen Reactions  . Bactrim   . Codeine   . Contrast Media [Iodinated Diagnostic Agents]   . Nitrofurantoin   . Nitrofurantoin Monohyd Macro   . Penicillins   . Sulfamethoxazole-Trimethoprim         Review of Systems  Constitutional: Negative for fatigue.  Eyes: Negative for visual disturbance.  Respiratory: Positive for shortness of breath. Negative for cough, chest tightness and wheezing.   Cardiovascular: Negative for chest pain, palpitations and leg swelling.  Neurological: Negative for dizziness, seizures, syncope, weakness, light-headedness and headaches.       Objective:   Physical Exam  Constitutional: She appears well-developed and well-nourished.  Cardiovascular: Normal rate.   Irregular rhythm.  Pulmonary/Chest: Effort normal and breath sounds normal. No respiratory distress. She has no wheezes. She has no rales.  Musculoskeletal:  Left ankle reveals no edema. Full range of motion. No bony tenderness. No Achilles tenderness Left shoulder reveals somewhat restricted range of motion with internal rotation. She is able to abduct her shoulder fully but has some pain with abduction.          Assessment & Plan:  #1 left ankle sprain. No signs of instability. Set up physical therapy #2 persistent left shoulder pain.  Rotator cuff tendinitis. Slightly restricted range of motion. Set up physical therapy. Ordered x-rays left shoulder #3 chronic atrial fibrillation. Rate controlled. She'll discuss with cardiologist possible switch to once daily oral anticoagulant vs Coumadin. We explained may be cost maybe a barrier to this.

## 2013-09-09 NOTE — Patient Instructions (Signed)

## 2013-09-10 ENCOUNTER — Other Ambulatory Visit: Payer: Self-pay | Admitting: Family Medicine

## 2013-09-10 ENCOUNTER — Other Ambulatory Visit: Payer: Self-pay | Admitting: Cardiovascular Disease

## 2013-09-11 ENCOUNTER — Telehealth: Payer: Self-pay | Admitting: Family Medicine

## 2013-09-11 NOTE — Telephone Encounter (Signed)
Mailed forms to pt address.

## 2013-09-11 NOTE — Telephone Encounter (Signed)
Pt ask if you could mail her the paperwork that she left here for you to fax.

## 2013-09-23 ENCOUNTER — Other Ambulatory Visit: Payer: Self-pay | Admitting: Family Medicine

## 2013-09-28 ENCOUNTER — Telehealth: Payer: Self-pay | Admitting: Family Medicine

## 2013-09-28 ENCOUNTER — Telehealth: Payer: Self-pay

## 2013-09-28 DIAGNOSIS — S93409A Sprain of unspecified ligament of unspecified ankle, initial encounter: Secondary | ICD-10-CM

## 2013-09-28 MED ORDER — WARFARIN SODIUM 5 MG PO TABS: ORAL_TABLET | ORAL | Status: DC

## 2013-09-28 NOTE — Telephone Encounter (Signed)
Please see the below re-fill request

## 2013-09-28 NOTE — Telephone Encounter (Signed)
Pt requesting order to have left ankle x-rayed.  Pt states she sprained her ankle 10 weeks ago and it is still bothering her.  Order was already placed for pt to have left shoulder x-rayed, however, pt would like to go and have both done at the same time.

## 2013-09-28 NOTE — Telephone Encounter (Signed)
Referral is ordered

## 2013-09-28 NOTE — Telephone Encounter (Signed)
Done

## 2013-09-28 NOTE — Telephone Encounter (Signed)
warfarin (COUMADIN) 5 MG tablet refill   [facility]

## 2013-09-28 NOTE — Telephone Encounter (Signed)
OK to order 

## 2013-09-30 ENCOUNTER — Other Ambulatory Visit: Payer: Self-pay | Admitting: Family

## 2013-09-30 ENCOUNTER — Other Ambulatory Visit: Payer: Self-pay | Admitting: *Deleted

## 2013-09-30 ENCOUNTER — Ambulatory Visit (INDEPENDENT_AMBULATORY_CARE_PROVIDER_SITE_OTHER): Payer: Commercial Managed Care - HMO | Admitting: Family

## 2013-09-30 DIAGNOSIS — Z5181 Encounter for therapeutic drug level monitoring: Secondary | ICD-10-CM

## 2013-09-30 DIAGNOSIS — I4891 Unspecified atrial fibrillation: Secondary | ICD-10-CM

## 2013-09-30 LAB — POCT INR: INR: 2.6

## 2013-09-30 MED ORDER — WARFARIN SODIUM 5 MG PO TABS: ORAL_TABLET | ORAL | Status: DC

## 2013-09-30 NOTE — Patient Instructions (Signed)
Continue to take 1 pill everyday except 1/2 pill on Mondays/Wednesday /Fridays. Recheck in 6 weeks..  Anticoagulation Dose Instructions as of 09/30/2013     Glynis Smiles Tue Wed Thu Fri Sat   New Dose 5 mg 2.5 mg 5 mg 2.5 mg 5 mg 2.5 mg 5 mg    Description       Continue to take 1 pill everyday except 1/2 pill on Mondays/Wednesday /Fridays. Recheck in 6 weeks.Marland Kitchen

## 2013-10-01 ENCOUNTER — Ambulatory Visit: Payer: Medicare HMO | Attending: Family Medicine | Admitting: Physical Therapy

## 2013-10-01 DIAGNOSIS — I10 Essential (primary) hypertension: Secondary | ICD-10-CM | POA: Insufficient documentation

## 2013-10-01 DIAGNOSIS — M2560 Stiffness of unspecified joint, not elsewhere classified: Secondary | ICD-10-CM | POA: Diagnosis not present

## 2013-10-01 DIAGNOSIS — R5381 Other malaise: Secondary | ICD-10-CM | POA: Diagnosis not present

## 2013-10-01 DIAGNOSIS — IMO0001 Reserved for inherently not codable concepts without codable children: Secondary | ICD-10-CM | POA: Diagnosis present

## 2013-10-01 DIAGNOSIS — M25519 Pain in unspecified shoulder: Secondary | ICD-10-CM | POA: Insufficient documentation

## 2013-10-01 DIAGNOSIS — R42 Dizziness and giddiness: Secondary | ICD-10-CM | POA: Insufficient documentation

## 2013-10-01 DIAGNOSIS — M25579 Pain in unspecified ankle and joints of unspecified foot: Secondary | ICD-10-CM | POA: Insufficient documentation

## 2013-10-01 DIAGNOSIS — M899 Disorder of bone, unspecified: Secondary | ICD-10-CM | POA: Insufficient documentation

## 2013-10-01 DIAGNOSIS — M949 Disorder of cartilage, unspecified: Secondary | ICD-10-CM

## 2013-10-02 ENCOUNTER — Ambulatory Visit (INDEPENDENT_AMBULATORY_CARE_PROVIDER_SITE_OTHER)
Admission: RE | Admit: 2013-10-02 | Discharge: 2013-10-02 | Disposition: A | Discharge status: Home / Self Care | Payer: Commercial Managed Care - HMO | Source: Ambulatory Visit | Attending: Family Medicine | Admitting: Family Medicine

## 2013-10-02 DIAGNOSIS — M25519 Pain in unspecified shoulder: Secondary | ICD-10-CM

## 2013-10-02 DIAGNOSIS — M25512 Pain in left shoulder: Secondary | ICD-10-CM

## 2013-10-02 DIAGNOSIS — S93409A Sprain of unspecified ligament of unspecified ankle, initial encounter: Secondary | ICD-10-CM

## 2013-10-05 ENCOUNTER — Ambulatory Visit: Payer: Medicare HMO | Admitting: Physical Therapy

## 2013-10-07 ENCOUNTER — Ambulatory Visit: Payer: Medicare HMO | Admitting: Physical Therapy

## 2013-10-07 ENCOUNTER — Ambulatory Visit (INDEPENDENT_AMBULATORY_CARE_PROVIDER_SITE_OTHER): Payer: Commercial Managed Care - HMO

## 2013-10-07 DIAGNOSIS — IMO0001 Reserved for inherently not codable concepts without codable children: Secondary | ICD-10-CM | POA: Diagnosis not present

## 2013-10-07 DIAGNOSIS — Z23 Encounter for immunization: Secondary | ICD-10-CM

## 2013-10-09 ENCOUNTER — Encounter: Payer: Self-pay | Admitting: Family Medicine

## 2013-10-09 ENCOUNTER — Ambulatory Visit (INDEPENDENT_AMBULATORY_CARE_PROVIDER_SITE_OTHER): Payer: Commercial Managed Care - HMO | Admitting: Family Medicine

## 2013-10-09 VITALS — BP 126/70 | HR 87 | Wt 154.0 lb

## 2013-10-09 DIAGNOSIS — M25512 Pain in left shoulder: Secondary | ICD-10-CM

## 2013-10-09 DIAGNOSIS — M7582 Other shoulder lesions, left shoulder: Secondary | ICD-10-CM

## 2013-10-09 MED ORDER — METHYLPREDNISOLONE ACETATE 40 MG/ML IJ SUSP
40.0000 mg | Freq: Once | INTRAMUSCULAR | Status: AC
  Administered 2013-10-09: 40 mg via INTRAMUSCULAR

## 2013-10-09 NOTE — Progress Notes (Signed)
Subjective:    Patient ID: [name], female    DOB: 12/10/1926, 80 y.o.   MRN: [id number]  HPI Patient here to discuss musculoskeletal complaints. She had recent left ankle sprain and has some ongoing shoulder issues. She is seeing physical therapy for her shoulder and ankle. She is improved with range of motion but still has significant left shoulder pain. Recent x-rays of the ankle and shoulder did not show any acute bony abnormalities. She is ambulating without difficulty. We had tried steroid injection several months ago back in the spring which did not seem to help much. She has pain with abduction and internal rotation.  Past Medical History  Diagnosis Date  . B12 DEFICIENCY 11/25/2009  . ESSENTIAL HYPERTENSION 09/30/2009  . CHF 01/27/2009  . SMALL BOWEL OBSTRUCTION 05/05/2008  . DIVERTICULITIS OF COLON 01/27/2009  . OSTEOARTHRITIS, KNEE, RIGHT 02/08/2009  . OSTEOPENIA 01/27/2009  . Left ventricular dysfunction   . Hypertension   . Chronic systolic heart failure 07/25/2009    a. Myoview 5/12:  Low risk, no ischemia, apical lateral defect probably breast attenuation, inf HK, EF 45%.  b. Echo 8/12: EF 45-50%, inf/inf-septal and apical HK, mild LVH, mild to mod MR, mild to mod TR, PASP 50;  c.  Echo 3/14:  Mild LVH, EF 20%, diff HK, mild to mod MR, mild LAE, mild to mod reduced RVSF, mild RAE, mild to mod TR, PASP 41  . Hemorrhoids   . Rectal bleeding   . Atrial fib/flutter, transient    Past Surgical History  Procedure Laterality Date  . Colon surgery  2006    Sigmoid colectomy for diverticulitis  . Abdominal hysterectomy  1996    TAHBSO  . Appendectomy  1951  . Tonsillectomy  1966  . Cardiac catheterization  April 14, 2012    nonobstructive CAD; severe LV dysfunction.     reports that she has never smoked. She has never used smokeless tobacco. She reports that she does not drink alcohol or use illicit drugs. family history includes Emphysema in an other family member; Heart  disease in her father, sister, and another family member. Allergies  Allergen Reactions  . Bactrim   . Codeine   . Contrast Media [Iodinated Diagnostic Agents]   . Nitrofurantoin   . Nitrofurantoin Monohyd Macro   . Penicillins   . Sulfamethoxazole-Trimethoprim       Review of Systems  Respiratory: Negative for shortness of breath.   Cardiovascular: Negative for chest pain.  Neurological: Negative for weakness and numbness.       Objective:   Physical Exam  Constitutional: She appears well-developed and well-nourished.  Cardiovascular: Normal rate.   Pulmonary/Chest: Effort normal and breath sounds normal. No respiratory distress. She has no wheezes. She has no rales.  Musculoskeletal:  Left ankle reveals no significant edema. Good range of motion. Minimal distal Achilles tenderness the Achilles is fully intact. Left shoulder reveals pain with abduction against resistance and also internal rotation. No localized bony tenderness.          Assessment & Plan:  Persistent left shoulder pain. Suspect she has some rotator cuff tendinitis/bursitis. She is already in physical therapy but is having significant pain with range of motion. Recent x-rays no acute bony abnormality.   Discussed risks and benefits of corticosteroid injection and patient consented.  After prepping skin with betadine, injected 40 mg depomedrol and 2 cc of plain xylocaine with 23 gauge one and one half inch needle using posterior lateral approach and  pt tolerated well. She will continue with physical therapy for range of motion and strengthening

## 2013-10-09 NOTE — Patient Instructions (Signed)
Rotator Cuff Tendinitis  Rotator cuff tendinitis is inflammation of the tough, cord-like bands that connect muscle to bone (tendons) in your rotator cuff. Your rotator cuff is the collection of all the muscles and tendons that connect your arm to your shoulder. Your rotator cuff holds the head of your upper arm bone (humerus) in the cup (fossa) of your shoulder blade (scapula). CAUSES Rotator cuff tendinitis is usually caused by overusing the joint involved.  SIGNS AND SYMPTOMS  Deep ache in the shoulder also felt on the outside upper arm over the shoulder muscle.  Point tenderness over the area that is injured.  Pain comes on gradually and becomes worse with lifting the arm to the side (abduction) or turning it inward (internal rotation).  May lead to a chronic tear: When a rotator cuff tendon becomes inflamed, it runs the risk of losing its blood supply, causing some tendon fibers to die. This increases the risk that the tendon can fray and partially or completely tear. DIAGNOSIS Rotator cuff tendinitis is diagnosed by taking a medical history, performing a physical exam, and reviewing results of imaging exams. The medical history is useful to help determine the type of rotator cuff injury. The physical exam will include looking at the injured shoulder, feeling the injured area, and watching you do range-of-motion exercises. X-ray exams are typically done to rule out other causes of shoulder pain, such as fractures. MRI is the imaging exam usually used for significant shoulder injuries. Sometimes a dye study called CT arthrogram is done, but it is not as widely used as MRI. In some institutions, special ultrasound tests may also be used to aid in the diagnosis. TREATMENT  Less Severe Cases  Use of a sling to rest the shoulder for a short period of time. Prolonged use of the sling can cause stiffness, weakness, and loss of motion of the shoulder joint.  Anti-inflammatory medicines, such as  ibuprofen or naproxen sodium, may be prescribed. More Severe Cases  Physical therapy.  Use of steroid injections into the shoulder joint.  Surgery. HOME CARE INSTRUCTIONS   Use a sling or splint until the pain decreases. Prolonged use of the sling can cause stiffness, weakness, and loss of motion of the shoulder joint.  Apply ice to the injured area:  Put ice in a plastic bag.  Place a towel between your skin and the bag.  Leave the ice on for 20 minutes, 2-3 times a day.  Try to avoid use other than gentle range of motion while your shoulder is painful. Use the shoulder and exercise only as directed by your health care provider. Stop exercises or range of motion if pain or discomfort increases, unless directed otherwise by your health care provider.  Only take over-the-counter or prescription medicines for pain, discomfort, or fever as directed by your health care provider.  If you were given a shoulder sling and straps (immobilizer), do not remove it except as directed, or until you see a health care provider for a follow-up exam. If you need to remove it, move your arm as little as possible or as directed.  You may want to sleep on several pillows at night to lessen swelling and pain. SEEK IMMEDIATE MEDICAL CARE IF:   Your shoulder pain increases or new pain develops in your arm, hand, or fingers and is not relieved with medicines.  You have new, unexplained symptoms, especially increased numbness in the hands or loss of strength.  You develop any worsening of the problems   that brought you in for care.  Your arm, hand, or fingers are numb or tingling.  Your arm, hand, or fingers are swollen, painful, or turn white or blue. MAKE SURE YOU:  Understand these instructions.  Will watch your condition.  Will get help right away if you are not doing well or get worse. Document Released: 03/17/2003 Document Revised: 10/15/2012 Document Reviewed: 08/06/2012 [employer] Patient  Information 2015 [employer]. This information is not intended to replace advice given to you by your health care provider. Make sure you discuss any questions you have with your health care provider.  

## 2013-10-09 NOTE — Progress Notes (Signed)
Pre visit review using our clinic review tool, if applicable. No additional management support is needed unless otherwise documented below in the visit note. 

## 2013-10-12 ENCOUNTER — Encounter: Payer: Self-pay | Admitting: Cardiovascular Disease

## 2013-10-12 ENCOUNTER — Other Ambulatory Visit: Payer: Commercial Managed Care - HMO | Admitting: Nurse Practitioner

## 2013-10-12 ENCOUNTER — Ambulatory Visit (INDEPENDENT_AMBULATORY_CARE_PROVIDER_SITE_OTHER): Payer: Commercial Managed Care - HMO | Admitting: Cardiovascular Disease

## 2013-10-12 ENCOUNTER — Encounter (INDEPENDENT_AMBULATORY_CARE_PROVIDER_SITE_OTHER): Payer: Self-pay

## 2013-10-12 VITALS — BP 132/64 | HR 52 | Ht 66.5 in | Wt 158.0 lb

## 2013-10-12 DIAGNOSIS — I482 Chronic atrial fibrillation, unspecified: Secondary | ICD-10-CM

## 2013-10-12 DIAGNOSIS — I5022 Chronic systolic (congestive) heart failure: Secondary | ICD-10-CM

## 2013-10-12 LAB — BASIC METABOLIC PANEL
BUN: 34 mg/dL — AB (ref 6–23)
CHLORIDE: 102 meq/L (ref 96–112)
CO2: 27 meq/L (ref 19–32)
Calcium: 9 mg/dL (ref 8.4–10.5)
Creatinine, Ser: 1.3 mg/dL — ABNORMAL HIGH (ref 0.4–1.2)
GFR: 43.29 mL/min — AB (ref >60.00)
GLUCOSE: 87 mg/dL (ref 70–99)
Potassium: 3.9 mEq/L (ref 3.5–5.1)
Sodium: 139 mEq/L (ref 135–145)

## 2013-10-12 LAB — BRAIN NATRIURETIC PEPTIDE: Pro B Natriuretic peptide (BNP): 223 pg/mL — ABNORMAL HIGH (ref 0.0–100.0)

## 2013-10-12 MED ORDER — FUROSEMIDE 20 MG PO TABS: 20.0000 mg | ORAL_TABLET | Freq: Two times a day (BID) | ORAL | Status: DC

## 2013-10-12 NOTE — Progress Notes (Signed)
[name] Date of Birth  12/22/1926       [facility]    [place] [facility] [address], [address]  [address] [place], [place]  [address]   [place]  [id number] [phone]     [phone]   Fax  [phone]    Fax [phone]  Problem List: 1. Atrial fibrillation 2. Hypertension 3. Benign Positional Vertigo 4. Chronic systolic CHF -  Left ventricle: The cavity size was mildly dilated. Wall thickness was increased in a pattern of mild LVH. The estimated ejection fraction was 20%. Diffuse hypokinesis. - Mitral valve: Mild to moderate regurgitation. - Left atrium: The atrium was mildly dilated. - Right ventricle: Systolic function was mildly to moderately reduced. - Right atrium: The atrium was mildly dilated. - Tricuspid valve: Mild-moderate regurgitation. - Pulmonary arteries: PA peak pressure: 41mm Hg  History of Present Illness:  [name] is a 80 y.o. female with a history of atrial fibrillation. She was cardioverted in October, 2012. She unfortunately has gone back into atrial fibrillation.  She has felt very well. She's not having any episodes of chest pain or shortness breath. She's tolerating the rhythm quite well. Her only complaint is that she's itching from the patches were placed during the cardioversion. She also complains of having very dry and cracking skin on her hands.   Sept. 16, 2013 She has been exercising regularly.  She has gained weight since I last saw her ( 11 lbs.)  She has no complaints of dyspnea or chest pain.  March 21, 2012:  She was seen by Lorin PicketScott several weeks ago. She was scheduled have an echocardiogram but she has not had that yet.  ( was scheduled but we had a big snow storm).  She's been having more shortness breath and more leg edema.  Scar decreased her Lasix. Her leg swelling is better.  He also has some chest heaviness.  She is trying to limit her stair climbing.  April 04, 2012:  Her recent  echo shows markedly decreased LV function   Left ventricle: The cavity size was mildly dilated. Wall thickness was increased in a pattern of mild LVH. The estimated ejection fraction was 20%. Diffuse hypokinesis. - Mitral valve: Mild to moderate regurgitation. - Left atrium: The atrium was mildly dilated. - Right ventricle: Systolic function was mildly to moderately reduced. - Right atrium: The atrium was mildly dilated. - Tricuspid valve: Mild-moderate regurgitation. - Pulmonary arteries: PA peak pressure: 41mm Hg (S).   her myoivew study revealed an apical defect - ? Apical thinning.  Low risk study.  She continues to have shortness breath. Not sure that she's having any chest pain.  It's very difficult to get an accurate history of her. She seems to ramble in her conversation.  July 02, 2012:  Flow had a cardiac cath since of last year.  She developed a rash after the cath  She has nonobstructive coronary artery disease. Her ejection fraction is severely depressed with an EF of 20-25%. She continues to have atrial fibrillation with a well-controlled ventricular response. She is on chronic Coumadin therapy.  [place].   [place] [employer] [place] about 1 mile)  She stays very active.    She complains of being short of breath but she remains very active.   Dec. 22, 2014:  [name] continues to have more dyspnea.  She added an extra Lasix ( now 4  times a day).  She does not think that she's breathing any better since increasing her Lasix from 3 times a day to 4 times a day.  She still walks on a regular basis except very bad weather.    04/10/2013:  [name] has done well.  She had a brief episode of sharp CP - lasted 15 seconds.   She has not been going to the gym.  She is exercising in the AM to a CD.   Able to workout without any CP or dyspnea.   Oct. 5, 2015:  [name] is seen back for follow up visit. Short of breath  - about the same.   Sprained her  ankle - had a cortisone injection Had a birthday recently.    Current Outpatient Prescriptions on File Prior to Visit  Medication Sig Dispense Refill  . furosemide (LASIX) 20 MG tablet TAKE THREE TABLETS BY MOUTH TWICE DAILY  180 tablet  0  . GuaiFENesin (MUCUS RELIEF ADULT PO) Take by mouth.      Marland Kitchen KLOR-CON M10 10 MEQ tablet TAKE TWO TABLETS BY MOUTH ONCE DAILY-NEEDS TO BE SEEN  60 tablet  1  . loratadine (CLARITIN) 10 MG tablet Take 10 mg by mouth daily.        Marland Kitchen losartan (COZAAR) 50 MG tablet TAKE ONE TABLET BY MOUTH ONCE DAILY  90 tablet  3  . metoprolol (LOPRESSOR) 100 MG tablet TAKE ONE TABLET BY MOUTH TWICE DAILY  60 tablet  5  . Multiple Vitamin (MULTIVITAMIN) tablet Take 1 tablet by mouth daily.        . Multiple Vitamins-Minerals (ICAPS PO) Take 2 tablets by mouth daily.       Marland Kitchen spironolactone (ALDACTONE) 25 MG tablet Take 0.5 tablets (12.5 mg total) by mouth daily.  90 tablet  3  . warfarin (COUMADIN) 5 MG tablet Take as directed by anticoagulation clinic  30 tablet  3   No current facility-administered medications on file prior to visit.    Allergies  Allergen Reactions  . Bactrim   . Codeine   . Contrast Media [Iodinated Diagnostic Agents]   . Nitrofurantoin   . Nitrofurantoin Monohyd Macro   . Penicillins   . Sulfamethoxazole-Trimethoprim     Past Medical History  Diagnosis Date  . B12 DEFICIENCY 11/25/2009  . ESSENTIAL HYPERTENSION 09/30/2009  . CHF 01/27/2009  . SMALL BOWEL OBSTRUCTION 05/05/2008  . DIVERTICULITIS OF COLON 01/27/2009  . OSTEOARTHRITIS, KNEE, RIGHT 02/08/2009  . OSTEOPENIA 01/27/2009  . Left ventricular dysfunction   . Hypertension   . Chronic systolic heart failure 07/25/2009    a. Myoview 5/12:  Low risk, no ischemia, apical lateral defect probably breast attenuation, inf HK, EF 45%.  b. Echo 8/12: EF 45-50%, inf/inf-septal and apical HK, mild LVH, mild to mod MR, mild to mod TR, PASP 50;  c.  Echo 3/14:  Mild LVH, EF 20%, diff HK, mild to mod MR,  mild LAE, mild to mod reduced RVSF, mild RAE, mild to mod TR, PASP 41  . Hemorrhoids   . Rectal bleeding   . Atrial fib/flutter, transient     Past Surgical History  Procedure Laterality Date  . Colon surgery  2006    Sigmoid colectomy for diverticulitis  . Abdominal hysterectomy  1996    TAHBSO  . Appendectomy  1951  . Tonsillectomy  1966  . Cardiac catheterization  April 14, 2012    nonobstructive CAD; severe LV dysfunction.     History  Smoking  status  . Never Smoker   Smokeless tobacco  . Never Used    History  Alcohol Use No    Family History  Problem Relation Age of Onset  . Heart disease    . Emphysema    . Heart disease Father   . Heart disease Sister     Reviw of Systems:  Reviewed in the HPI.  All other systems are negative.  Physical Exam: Blood pressure 132/64, pulse 52, height 5' 6.5" (1.689 m), weight 158 lb (71.668 kg). She did not take any of her medications today.  General: Well developed, well nourished, in no acute distress.  Head: Normocephalic, atraumatic, sclera non-icteric, mucus membranes are moist,   Neck: Supple. Carotids are 2 + without bruits. No JVD  Lungs: Clear bilaterally to auscultation.  Heart: Irregularly irregular.  normal  S1 S2. No murmurs, gallops or rubs.  Abdomen: Soft, non-tender, non-distended with normal bowel sounds. No hepatomegaly. No rebound/guarding. No masses.  Msk:  Strength and tone are normal  Extremities: No clubbing or cyanosis. No edema.  Distal pedal pulses are 2+ and equal bilaterally.  Neuro: Alert and oriented X 3. Moves all extremities spontaneously.  Psych:  Responds to questions appropriately with a normal affect.  ECG: 04/10/2013: Atrial fibrillation with a rate of 82. She has nonspecific ST and T wave changes. Assessment / Plan:

## 2013-10-12 NOTE — Patient Instructions (Addendum)
Your physician recommends that you have lab work:  TODAY - BMET, BNP   Your physician recommends that you continue on your current medications as directed. Please refer to the Current Medication list given to you today.  Your physician wants you to follow-up in: 6 months with Dr. Elease HashimotoNahser.  You will receive a reminder letter in the mail two months in advance. If you don't receive a letter, please call our office to schedule the follow-up appointment. Your physician recommends that you return for lab work in: 6 months on the day of or a few days before your office visit with Dr. Elease HashimotoNahser.  You will need to FAST for this appointment - nothing to eat or drink after midnight the night before except water.

## 2013-10-12 NOTE — Assessment & Plan Note (Signed)
[name] seems to be stable. Has DOe but is able to do al of her activities without any new problems  Will check BMP and BNP today I will see her in 6 months.

## 2013-10-12 NOTE — Assessment & Plan Note (Signed)
Her atrial fib is stable  Continue coumadin

## 2013-10-13 ENCOUNTER — Ambulatory Visit: Payer: Medicare HMO | Attending: Family Medicine | Admitting: Physical Therapy

## 2013-10-13 ENCOUNTER — Telehealth: Payer: Self-pay | Admitting: Cardiovascular Disease

## 2013-10-13 DIAGNOSIS — M2560 Stiffness of unspecified joint, not elsewhere classified: Secondary | ICD-10-CM | POA: Diagnosis not present

## 2013-10-13 DIAGNOSIS — I10 Essential (primary) hypertension: Secondary | ICD-10-CM | POA: Insufficient documentation

## 2013-10-13 DIAGNOSIS — Z5189 Encounter for other specified aftercare: Secondary | ICD-10-CM | POA: Diagnosis present

## 2013-10-13 DIAGNOSIS — R42 Dizziness and giddiness: Secondary | ICD-10-CM | POA: Insufficient documentation

## 2013-10-13 DIAGNOSIS — R5381 Other malaise: Secondary | ICD-10-CM | POA: Insufficient documentation

## 2013-10-13 DIAGNOSIS — M8580 Other specified disorders of bone density and structure, unspecified site: Secondary | ICD-10-CM | POA: Diagnosis not present

## 2013-10-13 DIAGNOSIS — M25572 Pain in left ankle and joints of left foot: Secondary | ICD-10-CM | POA: Diagnosis not present

## 2013-10-13 DIAGNOSIS — M25512 Pain in left shoulder: Secondary | ICD-10-CM | POA: Insufficient documentation

## 2013-10-13 NOTE — Telephone Encounter (Signed)
Patient is returning your call, please call and advise.  °

## 2013-10-13 NOTE — Telephone Encounter (Signed)
Spoke with patient and reviewed lab results; patient verbalized understanding

## 2013-10-15 ENCOUNTER — Ambulatory Visit: Payer: Medicare HMO | Admitting: Physical Therapy

## 2013-10-15 DIAGNOSIS — Z5189 Encounter for other specified aftercare: Secondary | ICD-10-CM | POA: Diagnosis not present

## 2013-10-19 ENCOUNTER — Other Ambulatory Visit: Payer: Self-pay | Admitting: Cardiovascular Disease

## 2013-10-20 ENCOUNTER — Other Ambulatory Visit: Payer: Self-pay

## 2013-10-20 MED ORDER — POTASSIUM CHLORIDE CRYS ER 10 MEQ PO TBCR: EXTENDED_RELEASE_TABLET | ORAL | Status: DC

## 2013-10-21 ENCOUNTER — Ambulatory Visit: Payer: Medicare HMO | Admitting: Physical Therapy

## 2013-10-21 DIAGNOSIS — Z5189 Encounter for other specified aftercare: Secondary | ICD-10-CM | POA: Diagnosis not present

## 2013-10-23 ENCOUNTER — Ambulatory Visit: Payer: Medicare HMO | Admitting: Physical Therapy

## 2013-10-23 DIAGNOSIS — Z5189 Encounter for other specified aftercare: Secondary | ICD-10-CM | POA: Diagnosis not present

## 2013-10-26 ENCOUNTER — Ambulatory Visit: Payer: Medicare HMO | Admitting: Physical Therapy

## 2013-10-26 DIAGNOSIS — Z5189 Encounter for other specified aftercare: Secondary | ICD-10-CM | POA: Diagnosis not present

## 2013-10-28 ENCOUNTER — Ambulatory Visit: Payer: Medicare HMO | Admitting: Physical Therapy

## 2013-10-28 DIAGNOSIS — Z5189 Encounter for other specified aftercare: Secondary | ICD-10-CM | POA: Diagnosis not present

## 2013-11-02 ENCOUNTER — Ambulatory Visit: Payer: Medicare HMO | Admitting: Physical Therapy

## 2013-11-02 DIAGNOSIS — Z5189 Encounter for other specified aftercare: Secondary | ICD-10-CM | POA: Diagnosis not present

## 2013-11-04 ENCOUNTER — Ambulatory Visit: Payer: Medicare HMO | Admitting: Physical Therapy

## 2013-11-04 DIAGNOSIS — Z5189 Encounter for other specified aftercare: Secondary | ICD-10-CM | POA: Diagnosis not present

## 2013-11-09 ENCOUNTER — Ambulatory Visit: Payer: Commercial Managed Care - HMO | Admitting: Physical Therapy

## 2013-11-11 ENCOUNTER — Ambulatory Visit: Payer: Commercial Managed Care - HMO | Attending: Family Medicine | Admitting: Physical Therapy

## 2013-11-11 DIAGNOSIS — M2560 Stiffness of unspecified joint, not elsewhere classified: Secondary | ICD-10-CM | POA: Insufficient documentation

## 2013-11-11 DIAGNOSIS — R42 Dizziness and giddiness: Secondary | ICD-10-CM | POA: Insufficient documentation

## 2013-11-11 DIAGNOSIS — M25572 Pain in left ankle and joints of left foot: Secondary | ICD-10-CM | POA: Insufficient documentation

## 2013-11-11 DIAGNOSIS — M25512 Pain in left shoulder: Secondary | ICD-10-CM | POA: Insufficient documentation

## 2013-11-11 DIAGNOSIS — R5381 Other malaise: Secondary | ICD-10-CM | POA: Insufficient documentation

## 2013-11-11 DIAGNOSIS — M8580 Other specified disorders of bone density and structure, unspecified site: Secondary | ICD-10-CM | POA: Insufficient documentation

## 2013-11-11 DIAGNOSIS — I10 Essential (primary) hypertension: Secondary | ICD-10-CM | POA: Insufficient documentation

## 2013-11-11 DIAGNOSIS — Z5189 Encounter for other specified aftercare: Secondary | ICD-10-CM | POA: Insufficient documentation

## 2013-11-12 ENCOUNTER — Ambulatory Visit (INDEPENDENT_AMBULATORY_CARE_PROVIDER_SITE_OTHER): Payer: Commercial Managed Care - HMO | Admitting: Family

## 2013-11-12 ENCOUNTER — Ambulatory Visit: Payer: Commercial Managed Care - HMO

## 2013-11-12 DIAGNOSIS — I482 Chronic atrial fibrillation, unspecified: Secondary | ICD-10-CM

## 2013-11-12 DIAGNOSIS — Z5181 Encounter for therapeutic drug level monitoring: Secondary | ICD-10-CM

## 2013-11-12 LAB — POCT INR: INR: 2.6

## 2013-11-12 NOTE — Patient Instructions (Signed)
Continue to take 1 pill everyday except 1/2 pill on Mondays/Wednesday /Fridays. Recheck in 6 weeks.  Anticoagulation Dose Instructions as of 11/12/2013      Glynis SmilesSun Mon Tue Wed Thu Fri Sat   New Dose 5 mg 2.5 mg 5 mg 2.5 mg 5 mg 2.5 mg 5 mg    Description        Continue to take 1 pill everyday except 1/2 pill on Mondays/Wednesday /Fridays. Recheck in 6 weeks..Marland Kitchen

## 2013-11-16 ENCOUNTER — Other Ambulatory Visit: Payer: Self-pay | Admitting: Cardiovascular Disease

## 2013-11-16 ENCOUNTER — Ambulatory Visit: Payer: Commercial Managed Care - HMO | Admitting: Physical Therapy

## 2013-11-16 DIAGNOSIS — M8580 Other specified disorders of bone density and structure, unspecified site: Secondary | ICD-10-CM | POA: Diagnosis not present

## 2013-11-16 DIAGNOSIS — R42 Dizziness and giddiness: Secondary | ICD-10-CM | POA: Diagnosis not present

## 2013-11-16 DIAGNOSIS — I10 Essential (primary) hypertension: Secondary | ICD-10-CM | POA: Diagnosis not present

## 2013-11-16 DIAGNOSIS — Z5189 Encounter for other specified aftercare: Secondary | ICD-10-CM | POA: Diagnosis present

## 2013-11-16 DIAGNOSIS — R5381 Other malaise: Secondary | ICD-10-CM | POA: Diagnosis not present

## 2013-11-16 DIAGNOSIS — M25512 Pain in left shoulder: Secondary | ICD-10-CM | POA: Diagnosis not present

## 2013-11-16 DIAGNOSIS — M2560 Stiffness of unspecified joint, not elsewhere classified: Secondary | ICD-10-CM | POA: Diagnosis not present

## 2013-11-16 DIAGNOSIS — M25572 Pain in left ankle and joints of left foot: Secondary | ICD-10-CM | POA: Diagnosis not present

## 2013-11-27 ENCOUNTER — Ambulatory Visit: Payer: Commercial Managed Care - HMO | Admitting: Physical Therapy

## 2013-11-27 DIAGNOSIS — Z5189 Encounter for other specified aftercare: Secondary | ICD-10-CM | POA: Diagnosis not present

## 2013-11-30 ENCOUNTER — Ambulatory Visit: Payer: Commercial Managed Care - HMO | Admitting: Physical Therapy

## 2013-11-30 DIAGNOSIS — Z5189 Encounter for other specified aftercare: Secondary | ICD-10-CM | POA: Diagnosis not present

## 2013-12-01 ENCOUNTER — Telehealth: Payer: Self-pay

## 2013-12-01 NOTE — Telephone Encounter (Signed)
Pt wanted you to know that her hair is thinning out. She thinks it is the medication but she is not sure which one. She wants to know what she should do.

## 2013-12-01 NOTE — Telephone Encounter (Signed)
Pt informed. Appt set up.

## 2013-12-01 NOTE — Telephone Encounter (Signed)
Pt should come back in and we will assess and check thyroid.

## 2013-12-07 ENCOUNTER — Telehealth: Payer: Self-pay

## 2013-12-07 ENCOUNTER — Ambulatory Visit: Payer: Commercial Managed Care - HMO | Admitting: Physical Therapy

## 2013-12-07 DIAGNOSIS — Z5189 Encounter for other specified aftercare: Secondary | ICD-10-CM | POA: Diagnosis not present

## 2013-12-07 NOTE — Telephone Encounter (Signed)
I'm not sure,  Can you call her ?

## 2013-12-08 NOTE — Telephone Encounter (Signed)
Spoke with patient and questioned how much Lasix the patient is taking.  Patient reports she is taking Lasix 20 mg 3 pills twice daily.  I reviewed patient's chart and advised patient that at last office visit with Dr. Elease HashimotoNahser, she reported taking Lasix 20 mg BID and that I do not see record of her being told to increase this to 3 pills BID.  Patient verbalized understanding and agreement.  Patient states she has plenty of the medication and I advised that there are refills available through October 2016.  I advised patient to call back with questions or concerns and I notified Greta Doomose Jacobs, CMA, Patient Care Advocate that no further refills need to be sent.

## 2013-12-09 ENCOUNTER — Ambulatory Visit: Payer: Commercial Managed Care - HMO | Admitting: Physical Therapy

## 2013-12-14 ENCOUNTER — Telehealth: Payer: Self-pay | Admitting: Family Medicine

## 2013-12-14 ENCOUNTER — Ambulatory Visit: Payer: Commercial Managed Care - HMO | Admitting: Physical Therapy

## 2013-12-14 ENCOUNTER — Ambulatory Visit: Payer: Commercial Managed Care - HMO | Attending: Family Medicine | Admitting: Physical Therapy

## 2013-12-14 DIAGNOSIS — M2560 Stiffness of unspecified joint, not elsewhere classified: Secondary | ICD-10-CM | POA: Insufficient documentation

## 2013-12-14 DIAGNOSIS — M25572 Pain in left ankle and joints of left foot: Secondary | ICD-10-CM | POA: Diagnosis not present

## 2013-12-14 DIAGNOSIS — R5381 Other malaise: Secondary | ICD-10-CM | POA: Insufficient documentation

## 2013-12-14 DIAGNOSIS — Z5189 Encounter for other specified aftercare: Secondary | ICD-10-CM | POA: Diagnosis present

## 2013-12-14 DIAGNOSIS — R42 Dizziness and giddiness: Secondary | ICD-10-CM | POA: Insufficient documentation

## 2013-12-14 DIAGNOSIS — M25512 Pain in left shoulder: Secondary | ICD-10-CM | POA: Diagnosis not present

## 2013-12-14 DIAGNOSIS — I10 Essential (primary) hypertension: Secondary | ICD-10-CM | POA: Diagnosis not present

## 2013-12-14 DIAGNOSIS — M8580 Other specified disorders of bone density and structure, unspecified site: Secondary | ICD-10-CM | POA: Diagnosis not present

## 2013-12-14 NOTE — Telephone Encounter (Signed)
Pt informed to fast before coming.

## 2013-12-14 NOTE — Telephone Encounter (Signed)
Pt states [facility] informed her to come in to have labwork done at her visit with Dr. Caryl NeverBurchette on 12/16/13.  Pt calling to inquire if she needs to fast for lab work(lipid and TSH).  Advised pt that I did not show a lab order for these labs to be done on 12/16/13.  [facility] on labs and if she needs to take her lasix before coming in.

## 2013-12-16 ENCOUNTER — Ambulatory Visit (INDEPENDENT_AMBULATORY_CARE_PROVIDER_SITE_OTHER): Payer: Commercial Managed Care - HMO | Admitting: Family Medicine

## 2013-12-16 ENCOUNTER — Encounter: Payer: Self-pay | Admitting: Family Medicine

## 2013-12-16 ENCOUNTER — Encounter: Payer: Commercial Managed Care - HMO | Admitting: Physical Therapy

## 2013-12-16 VITALS — BP 132/70 | HR 80 | Temp 97.5°F | Wt 160.0 lb

## 2013-12-16 DIAGNOSIS — R208 Other disturbances of skin sensation: Secondary | ICD-10-CM

## 2013-12-16 DIAGNOSIS — I5022 Chronic systolic (congestive) heart failure: Secondary | ICD-10-CM

## 2013-12-16 DIAGNOSIS — L659 Nonscarring hair loss, unspecified: Secondary | ICD-10-CM

## 2013-12-16 DIAGNOSIS — R5383 Other fatigue: Secondary | ICD-10-CM

## 2013-12-16 DIAGNOSIS — Z23 Encounter for immunization: Secondary | ICD-10-CM

## 2013-12-16 LAB — LIPID PANEL
CHOL/HDL RATIO: 4
CHOLESTEROL: 237 mg/dL — AB (ref 0–200)
HDL: 61.8 mg/dL (ref >39.00)
NonHDL: 175.2
TRIGLYCERIDES: 202 mg/dL — AB (ref 0.0–149.0)
VLDL: 40.4 mg/dL — AB (ref 0.0–40.0)

## 2013-12-16 LAB — HEPATIC FUNCTION PANEL
ALBUMIN: 4.2 g/dL (ref 3.5–5.2)
ALT: 22 U/L (ref 0–35)
AST: 29 U/L (ref 0–37)
Alkaline Phosphatase: 59 U/L (ref 39–117)
Bilirubin, Direct: 0.1 mg/dL (ref 0.0–0.3)
TOTAL PROTEIN: 7 g/dL (ref 6.0–8.3)
Total Bilirubin: 0.9 mg/dL (ref 0.2–1.2)

## 2013-12-16 LAB — TSH: TSH: 4.37 u[IU]/mL (ref 0.35–4.50)

## 2013-12-16 LAB — LDL CHOLESTEROL, DIRECT: Direct LDL: 142 mg/dL

## 2013-12-16 LAB — VITAMIN B12: Vitamin B-12: >1500 pg/mL — ABNORMAL HIGH (ref 211–911)

## 2013-12-16 NOTE — Addendum Note (Signed)
Addended by: Rita OharaHRASHER, Christan Ciccarelli R on: 12/16/2013 09:07 AM   Modules accepted: Orders

## 2013-12-16 NOTE — Addendum Note (Signed)
Addended by: Thomasena EdisFLOYD, Kareen Hitsman E on: 12/16/2013 09:43 AM   Modules accepted: Orders

## 2013-12-16 NOTE — Progress Notes (Signed)
Subjective:    Patient ID: [name], female    DOB: 06/25/1926, 80 y.o.   MRN: [id number]  HPI Patient seen today for the following issues  Alopecia. She's had some general thinning of hair for several months. She has also had some increased fatigue and cold intolerance. She denies any consistent issues with constipation. She's had previous normal thyroid testing but almost 2 years ago. She recently was over taking Lasix taking up to 60 mg twice daily instead of recommended dose of 60 mg daily. She is convinced this caused her hair thinning.  She complains of bilateral burning sensation thighs legs and knees. Denies any weakness. Previously has taken B12 injections and currently on oral B12. No recent levels.  Atrial fibrillation on chronic Coumadin. No recent bleeding complications. Her blood pressures been stable. No recent falls.  Past Medical History  Diagnosis Date  . B12 DEFICIENCY 11/25/2009  . ESSENTIAL HYPERTENSION 09/30/2009  . CHF 01/27/2009  . SMALL BOWEL OBSTRUCTION 05/05/2008  . DIVERTICULITIS OF COLON 01/27/2009  . OSTEOARTHRITIS, KNEE, RIGHT 02/08/2009  . OSTEOPENIA 01/27/2009  . Left ventricular dysfunction   . Hypertension   . Chronic systolic heart failure 07/25/2009    a. Myoview 5/12:  Low risk, no ischemia, apical lateral defect probably breast attenuation, inf HK, EF 45%.  b. Echo 8/12: EF 45-50%, inf/inf-septal and apical HK, mild LVH, mild to mod MR, mild to mod TR, PASP 50;  c.  Echo 3/14:  Mild LVH, EF 20%, diff HK, mild to mod MR, mild LAE, mild to mod reduced RVSF, mild RAE, mild to mod TR, PASP 41  . Hemorrhoids   . Rectal bleeding   . Atrial fib/flutter, transient    Past Surgical History  Procedure Laterality Date  . Colon surgery  2006    Sigmoid colectomy for diverticulitis  . Abdominal hysterectomy  1996    TAHBSO  . Appendectomy  1951  . Tonsillectomy  1966  . Cardiac catheterization  April 14, 2012    nonobstructive CAD; severe LV  dysfunction.     reports that she has never smoked. She has never used smokeless tobacco. She reports that she does not drink alcohol or use illicit drugs. family history includes Emphysema in an other family member; Heart disease in her father, sister, and another family member. Allergies  Allergen Reactions  . Bactrim   . Codeine   . Contrast Media [Iodinated Diagnostic Agents]   . Nitrofurantoin   . Nitrofurantoin Monohyd Macro   . Penicillins   . Sulfamethoxazole-Trimethoprim       Review of Systems  Constitutional: Positive for fatigue. Negative for fever, chills, appetite change and unexpected weight change.  Respiratory: Negative for cough and shortness of breath.   Cardiovascular: Negative for chest pain and leg swelling.  Neurological: Negative for dizziness, weakness, numbness and headaches.       Objective:   Physical Exam  Constitutional: She is oriented to person, place, and time. She appears well-developed and well-nourished. No distress.  Neck: Neck supple. No JVD present. No thyromegaly present.  Cardiovascular: Normal rate and regular rhythm.   Pulmonary/Chest: Effort normal and breath sounds normal. No respiratory distress. She has no wheezes. She has no rales.  Musculoskeletal: She exhibits no edema.  Neurological: She is alert and oriented to person, place, and time. She has normal reflexes. Coordination normal.  Skin:  She has some general diffuse mild alopecia. No localized alopecia  Psychiatric: She has a normal mood and affect. Her  behavior is normal.          Assessment & Plan:  #1 generalized alopecia. She does have some associated fatigue and cold intolerance. Check TSH. #2 dysesthesias mostly involving lower extremities. History of questionable B12 deficiency. She is on oral B12 replacement. Check B12 levels  #3 health maintenance. Prevnar 13 given. Already had flu vaccine. #4 hypertension stable and at goal

## 2013-12-16 NOTE — Progress Notes (Signed)
Pre visit review using our clinic review tool, if applicable. No additional management support is needed unless otherwise documented below in the visit note. 

## 2013-12-18 ENCOUNTER — Telehealth: Payer: Self-pay | Admitting: Nurse Practitioner

## 2013-12-18 DIAGNOSIS — E785 Hyperlipidemia, unspecified: Secondary | ICD-10-CM

## 2013-12-18 MED ORDER — ATORVASTATIN CALCIUM 20 MG PO TABS: 20.0000 mg | ORAL_TABLET | Freq: Every day | ORAL | Status: DC

## 2013-12-18 NOTE — Telephone Encounter (Signed)
-----   Message from Vesta MixerPhilip J Nahser, MD sent at 12/16/2013  3:33 PM EST ----- Her cholesterol levels are elevated. Ask her to start Atorvastatin 20 a day Check repeat labs in 3 months .

## 2013-12-18 NOTE — Telephone Encounter (Signed)
Reviewed results and plan of care with patient who verbalized understanding and agreement to start Atorvastatin 20 mg once daily.  Rx to patient's pharmacy.  Patient is a little confused about the date of the follow-up blood work to recheck cholesterol and liver so I advised that I will call her back closer to the time it is due.  Patient verbalized understanding and agreement.

## 2013-12-23 ENCOUNTER — Encounter: Payer: Commercial Managed Care - HMO | Admitting: Physical Therapy

## 2013-12-24 ENCOUNTER — Ambulatory Visit (INDEPENDENT_AMBULATORY_CARE_PROVIDER_SITE_OTHER): Payer: Commercial Managed Care - HMO | Admitting: Family

## 2013-12-24 DIAGNOSIS — Z5181 Encounter for therapeutic drug level monitoring: Secondary | ICD-10-CM

## 2013-12-24 DIAGNOSIS — I482 Chronic atrial fibrillation, unspecified: Secondary | ICD-10-CM

## 2013-12-24 LAB — POCT INR: INR: 2

## 2013-12-24 NOTE — Patient Instructions (Signed)
Continue to take 1 pill everyday except 1/2 pill on Mondays/Wednesday /Fridays. Recheck in 6 weeks..  Anticoagulation Dose Instructions as of 12/24/2013      Glynis SmilesSun Mon Tue Wed Thu Fri Sat   New Dose 5 mg 2.5 mg 5 mg 2.5 mg 5 mg 2.5 mg 5 mg    Description        Continue to take 1 pill everyday except 1/2 pill on Mondays/Wednesday /Fridays. Recheck in 6 weeks..Marland Kitchen

## 2013-12-28 ENCOUNTER — Ambulatory Visit: Payer: Commercial Managed Care - HMO | Admitting: Physical Therapy

## 2013-12-30 ENCOUNTER — Encounter: Payer: Commercial Managed Care - HMO | Admitting: Physical Therapy

## 2014-01-04 ENCOUNTER — Encounter: Payer: Commercial Managed Care - HMO | Admitting: Physical Therapy

## 2014-01-12 ENCOUNTER — Encounter: Payer: Self-pay | Admitting: Family Medicine

## 2014-01-12 ENCOUNTER — Ambulatory Visit: Payer: Commercial Managed Care - HMO | Admitting: Family Medicine

## 2014-01-12 ENCOUNTER — Ambulatory Visit (INDEPENDENT_AMBULATORY_CARE_PROVIDER_SITE_OTHER): Payer: Commercial Managed Care - HMO | Admitting: Family Medicine

## 2014-01-12 VITALS — BP 130/70 | HR 81 | Temp 98.6°F | Wt 159.0 lb

## 2014-01-12 DIAGNOSIS — G629 Polyneuropathy, unspecified: Secondary | ICD-10-CM | POA: Diagnosis not present

## 2014-01-12 DIAGNOSIS — M7752 Other enthesopathy of left foot: Secondary | ICD-10-CM | POA: Diagnosis not present

## 2014-01-12 DIAGNOSIS — M25511 Pain in right shoulder: Secondary | ICD-10-CM | POA: Diagnosis not present

## 2014-01-12 MED ORDER — METHYLPREDNISOLONE ACETATE 40 MG/ML IJ SUSP
40.0000 mg | Freq: Once | INTRAMUSCULAR | Status: AC
  Administered 2014-01-12: 40 mg via INTRA_ARTICULAR

## 2014-01-12 NOTE — Patient Instructions (Signed)
Bursitis Bursitis is a swelling and soreness (inflammation) of a fluid-filled sac (bursa) that overlies and protects a joint. It can be caused by injury, overuse of the joint, arthritis or infection. The joints most likely to be affected are the elbows, shoulders, hips and knees. HOME CARE INSTRUCTIONS   Apply ice to the affected area for 15-20 minutes each hour while awake for 2 days. Put the ice in a plastic bag and place a towel between the bag of ice and your skin.  Rest the injured joint as much as possible, but continue to put the joint through a full range of motion, 4 times per day. (The shoulder joint especially becomes rapidly "frozen" if not used.) When the pain lessens, begin normal slow movements and usual activities.  Only take over-the-counter or prescription medicines for pain, discomfort or fever as directed by your caregiver.  Your caregiver may recommend draining the bursa and injecting medicine into the bursa. This may help the healing process.  Follow all instructions for follow-up with your caregiver. This includes any orthopedic referrals, physical therapy and rehabilitation. Any delay in obtaining necessary care could result in a delay or failure of the bursitis to heal and chronic pain. SEEK IMMEDIATE MEDICAL CARE IF:   Your pain increases even during treatment.  You develop an oral temperature above 102 F (38.9 C) and have heat and inflammation over the involved bursa. MAKE SURE YOU:   Understand these instructions.  Will watch your condition.  Will get help right away if you are not doing well or get worse. Document Released: 12/23/1999 Document Revised: 03/19/2011 Document Reviewed: 03/16/2013 [facility] Patient Information 2015 [employer] [place]. This information is not intended to replace advice given to you by your health care provider. Make sure you discuss any questions you have with your health care provider.  You likely have retro-calcaneal  bursitis Recommend try icing left heel for 15-20 minutes 2-3 times a day.

## 2014-01-12 NOTE — Progress Notes (Signed)
Pre visit review using our clinic review tool, if applicable. No additional management support is needed unless otherwise documented below in the visit note. 

## 2014-01-12 NOTE — Progress Notes (Signed)
Subjective:    Patient ID: [name], female    DOB: 1926/06/12, 81 y.o.   MRN: [id number]  HPI Patient seen for several issues. She has chronic problems including history of chronic atrial fibrillation, GERD, CHF, polyneuropathy, and osteoarthritis. We obtain recent TSH and B12 which were normal. She has hyperlipidemia recently started on atorvastatin per cardiology. She complains of some burning in both legs which has been for years. We have discussed possible medication options such as gabapentin or Lyrica but she is concerned because of potential side effects such as dizziness and sedation.  New problem of left posterior heel pain. No injury. No swelling. No bruising. Pain actually improves after she gets up and moves around but is painful when she first starts to walk. She's not tried any icing  Progressive right shoulder pain. No injury. Previous similar issue left shoulder which did improve with corticosteroid injection but not fully resolved. Pain with abduction and internal rotation  Past Medical History  Diagnosis Date  . B12 DEFICIENCY 11/25/2009  . ESSENTIAL HYPERTENSION 09/30/2009  . CHF 01/27/2009  . SMALL BOWEL OBSTRUCTION 05/05/2008  . DIVERTICULITIS OF COLON 01/27/2009  . OSTEOARTHRITIS, KNEE, RIGHT 02/08/2009  . OSTEOPENIA 01/27/2009  . Left ventricular dysfunction   . Hypertension   . Chronic systolic heart failure 07/25/2009    a. Myoview 5/12:  Low risk, no ischemia, apical lateral defect probably breast attenuation, inf HK, EF 45%.  b. Echo 8/12: EF 45-50%, inf/inf-septal and apical HK, mild LVH, mild to mod MR, mild to mod TR, PASP 50;  c.  Echo 3/14:  Mild LVH, EF 20%, diff HK, mild to mod MR, mild LAE, mild to mod reduced RVSF, mild RAE, mild to mod TR, PASP 41  . Hemorrhoids   . Rectal bleeding   . Atrial fib/flutter, transient    Past Surgical History  Procedure Laterality Date  . Colon surgery  2006    Sigmoid colectomy for diverticulitis  . Abdominal  hysterectomy  1996    TAHBSO  . Appendectomy  1951  . Tonsillectomy  1966  . Cardiac catheterization  April 14, 2012    nonobstructive CAD; severe LV dysfunction.     reports that she has never smoked. She has never used smokeless tobacco. She reports that she does not drink alcohol or use illicit drugs. family history includes Emphysema in an other family member; Heart disease in her father, sister, and another family member. Allergies  Allergen Reactions  . Bactrim   . Codeine   . Contrast Media [Iodinated Diagnostic Agents]   . Nitrofurantoin   . Nitrofurantoin Monohyd Macro   . Penicillins   . Sulfamethoxazole-Trimethoprim       Review of Systems  Constitutional: Negative for appetite change and unexpected weight change.  Respiratory: Negative for cough and shortness of breath.   Cardiovascular: Negative for chest pain.  Gastrointestinal: Negative for abdominal pain and blood in stool.  Musculoskeletal: Positive for arthralgias.  Hematological: Does not bruise/bleed easily.       Objective:   Physical Exam  Constitutional: She appears well-developed and well-nourished.  Neck: Neck supple.  Cardiovascular: Normal rate.   Irregularly irregular rhythm  Musculoskeletal: She exhibits edema.  Trace edema both legs She has pain with abduction of right shoulder greater than about 80 and also pain with internal rotation. No localized tenderness. No visible swelling or bruising.  She has tenderness over the left retrocalcaneal region. Lesser tenderness over the distal Achilles. She has good range of  motion with plantar and dorsiflexion. No visible swelling or erythema   Neurological: She is alert.  Strength right shoulder difficult to ascertain because of her pain          Assessment & Plan:  #1 probable retrocalcaneal bursitis left heel. We recommended icing 15-20 minutes 2-3 times daily. Gentle Achilles stretches. Ice only after walking and not before activities  #2  Right shoulder pain. Suspect rotator cuff tendinitis/bursitis. Discussed risks and benefits of corticosteroid injection and patient consented.  After prepping skin with betadine, injected 40 mg depomedrol and 2 cc of plain xylocaine with 23 gauge one and one half inch needle using posterior lateral approach and pt tolerated well. She will continue with gentle ROM exercises.  #3 Bilateral neuropathy symptoms lower extremities. Recent B12 normal. Recent TSH normal. We discussed possible medication options such as gabapentin or Lyrica to this point she wishes to observe

## 2014-01-23 ENCOUNTER — Other Ambulatory Visit: Payer: Self-pay | Admitting: Cardiovascular Disease

## 2014-02-04 ENCOUNTER — Ambulatory Visit (INDEPENDENT_AMBULATORY_CARE_PROVIDER_SITE_OTHER): Payer: Commercial Managed Care - HMO | Admitting: Family

## 2014-02-04 DIAGNOSIS — Z5181 Encounter for therapeutic drug level monitoring: Secondary | ICD-10-CM | POA: Diagnosis not present

## 2014-02-04 DIAGNOSIS — I482 Chronic atrial fibrillation, unspecified: Secondary | ICD-10-CM

## 2014-02-04 LAB — POCT INR: INR: 2.3

## 2014-02-04 NOTE — Patient Instructions (Signed)
Continue to take 1 pill everyday except 1/2 pill on Mondays/Wednesday Fridays. Recheck in 6 weeks..  Anticoagulation Dose Instructions as of 02/04/2014      [name] Mon Tue Wed Thu Fri Sat   New Dose 5 mg 2.5 mg 5 mg 2.5 mg 5 mg 2.5 mg 5 mg    Description        Continue to take 1 pill everyday except 1/2 pill on Mondays/Wednesday Fridays. Recheck in 6 weeks..Marland Kitchen

## 2014-03-01 DIAGNOSIS — M7670 Peroneal tendinitis, unspecified leg: Secondary | ICD-10-CM | POA: Diagnosis not present

## 2014-03-02 ENCOUNTER — Encounter: Payer: Self-pay | Admitting: Podiatry

## 2014-03-02 ENCOUNTER — Ambulatory Visit (INDEPENDENT_AMBULATORY_CARE_PROVIDER_SITE_OTHER): Payer: Commercial Managed Care - HMO | Admitting: Podiatry

## 2014-03-02 ENCOUNTER — Ambulatory Visit (INDEPENDENT_AMBULATORY_CARE_PROVIDER_SITE_OTHER): Payer: Commercial Managed Care - HMO

## 2014-03-02 VITALS — BP 111/68 | HR 75 | Resp 16 | Ht 65.5 in | Wt 150.0 lb

## 2014-03-02 DIAGNOSIS — M7670 Peroneal tendinitis, unspecified leg: Secondary | ICD-10-CM | POA: Diagnosis not present

## 2014-03-02 DIAGNOSIS — M79672 Pain in left foot: Secondary | ICD-10-CM

## 2014-03-02 DIAGNOSIS — M7672 Peroneal tendinitis, left leg: Secondary | ICD-10-CM

## 2014-03-02 NOTE — Progress Notes (Signed)
   Subjective:    Patient ID: [name], female    DOB: 01/24/1926, 81 y.o.   MRN: [id number]  HPI Comments: Twisted the left ankle in may of 2015 ,went to physical therapy from 9/15 till 12/15 . Tried to get back to the gym and it made my ankle it got worse. The bottom of my feet get numb . i do have neuropathy, caused from the shingles in 2004 .   Foot Pain      Review of Systems  All other systems reviewed and are negative.      Objective:   Physical Exam: I have reviewed her past medical history medications allergies surgery social history and review of systems. Pulses are strongly palpable bilateral. Neurologic sensorium appears to be intact per Semmes-Weinstein monofilament and vibratory sensation. Deep tendon reflexes are intact bilaterally muscle strength was 5 over 5 dorsiflexion plantar flexors and inverters and everters all intrinsic musculature is intact. He has pain on palpation and on eversion against resistance overlying the peroneal tendons of the left ankle. Radiographs do not demonstrate any type of osseus abnormalities in this area.        Assessment & Plan:  Assessment: Peroneal tendinitis left.  Plan: I injected the area today with Kenalog and local anesthetic. I will follow-up with her in 6 weeks.

## 2014-03-04 ENCOUNTER — Other Ambulatory Visit: Payer: Self-pay | Admitting: General Practice

## 2014-03-04 MED ORDER — WARFARIN SODIUM 5 MG PO TABS: ORAL_TABLET | ORAL | Status: DC

## 2014-03-10 ENCOUNTER — Telehealth: Payer: Self-pay

## 2014-03-10 NOTE — Telephone Encounter (Signed)
[facility] Warfarin 5 mg tablet-Take as directed by anticoagulation clinic.   Note:  This is a narrow therapeutic medication.  We need permission to change manufacturers, because a change may affect the paitent's leves and INR. The patient has been on Zydus generic warfarin.  This is no longer available from the manufacturer.  May we change to [facility].    Please call [phone] if you have questions.

## 2014-03-15 ENCOUNTER — Telehealth: Payer: Self-pay | Admitting: Family Medicine

## 2014-03-15 NOTE — Telephone Encounter (Signed)
Please see previous note.

## 2014-03-15 NOTE — Telephone Encounter (Signed)
First of all this is not my patient, I have never seen her. Please change the PCP banner and remove my name. Second this is a question that Bailey Mechindy Boyd should answer, thanks

## 2014-03-18 ENCOUNTER — Ambulatory Visit (INDEPENDENT_AMBULATORY_CARE_PROVIDER_SITE_OTHER): Payer: Commercial Managed Care - HMO | Admitting: Family Medicine

## 2014-03-18 ENCOUNTER — Ambulatory Visit (INDEPENDENT_AMBULATORY_CARE_PROVIDER_SITE_OTHER): Payer: Commercial Managed Care - HMO | Admitting: Family

## 2014-03-18 ENCOUNTER — Encounter: Payer: Self-pay | Admitting: Family Medicine

## 2014-03-18 ENCOUNTER — Ambulatory Visit: Payer: Commercial Managed Care - HMO

## 2014-03-18 VITALS — BP 120/70 | HR 83 | Temp 97.7°F | Wt 157.0 lb

## 2014-03-18 DIAGNOSIS — Z5181 Encounter for therapeutic drug level monitoring: Secondary | ICD-10-CM | POA: Diagnosis not present

## 2014-03-18 DIAGNOSIS — R6889 Other general symptoms and signs: Secondary | ICD-10-CM

## 2014-03-18 DIAGNOSIS — I482 Chronic atrial fibrillation, unspecified: Secondary | ICD-10-CM

## 2014-03-18 DIAGNOSIS — I4891 Unspecified atrial fibrillation: Secondary | ICD-10-CM | POA: Diagnosis not present

## 2014-03-18 DIAGNOSIS — G629 Polyneuropathy, unspecified: Secondary | ICD-10-CM | POA: Diagnosis not present

## 2014-03-18 LAB — POCT INR: INR: 2.5

## 2014-03-18 MED ORDER — GABAPENTIN 100 MG PO CAPS: 100.0000 mg | ORAL_CAPSULE | Freq: Every day | ORAL | Status: DC

## 2014-03-18 NOTE — Patient Instructions (Signed)
Continue to take 1 pill everyday except 1/2 pill on Mondays/Wednesday Fridays. Recheck in 6 weeks..  Anticoagulation Dose Instructions as of 03/18/2014      [name] SmilesSun Mon Tue Wed Thu Fri Sat   New Dose 5 mg 2.5 mg 5 mg 2.5 mg 5 mg 2.5 mg 5 mg    Description        Continue to take 1 pill everyday except 1/2 pill on Mondays/Wednesday Fridays. Recheck in 6 weeks.Marland Kitchen.    \

## 2014-03-18 NOTE — Progress Notes (Signed)
Pre visit review using our clinic review tool, if applicable. No additional management support is needed unless otherwise documented below in the visit note. 

## 2014-03-18 NOTE — Progress Notes (Signed)
Subjective:    Patient ID: [name] "[name], female    DOB: 02/16/1926, 81 y.o.   MRN: [id number]  HPI Patient seen for medical follow-up. Last visit we discussed the fact she's had some progressive bilateral burning leg pains which occur at rest.  She has had these for years but progressive in recent months.  She had recent TSH and B12 which were normal. We discussed medications such as Lyrica or gabapentin and she was reluctant. She is now reconsidering. Her pain is 8 out of 10 severity at its worst. No claudication symptoms.  Chronic atrial fibrillation on Coumadin. Other than some occasional bruising, no bleeding complications.  Patient states that she frequently feels "cold ". She's also had some thinning of hair. Recent TSH as above normal.  Past Medical History  Diagnosis Date  . B12 DEFICIENCY 11/25/2009  . ESSENTIAL HYPERTENSION 09/30/2009  . CHF 01/27/2009  . SMALL BOWEL OBSTRUCTION 05/05/2008  . DIVERTICULITIS OF COLON 01/27/2009  . OSTEOARTHRITIS, KNEE, RIGHT 02/08/2009  . OSTEOPENIA 01/27/2009  . Left ventricular dysfunction   . Hypertension   . Chronic systolic heart failure 07/25/2009    a. Myoview 5/12:  Low risk, no ischemia, apical lateral defect probably breast attenuation, inf HK, EF 45%.  b. Echo 8/12: EF 45-50%, inf/inf-septal and apical HK, mild LVH, mild to mod MR, mild to mod TR, PASP 50;  c.  Echo 3/14:  Mild LVH, EF 20%, diff HK, mild to mod MR, mild LAE, mild to mod reduced RVSF, mild RAE, mild to mod TR, PASP 41  . Hemorrhoids   . Rectal bleeding   . Atrial fib/flutter, transient    Past Surgical History  Procedure Laterality Date  . Colon surgery  2006    Sigmoid colectomy for diverticulitis  . Abdominal hysterectomy  1996    TAHBSO  . Appendectomy  1951  . Tonsillectomy  1966  . Cardiac catheterization  April 14, 2012    nonobstructive CAD; severe LV dysfunction.     reports that she has never smoked. She has never used smokeless tobacco. She  reports that she does not drink alcohol or use illicit drugs. family history includes Emphysema in an other family member; Heart disease in her father, sister, and another family member. Allergies  Allergen Reactions  . Bactrim   . Codeine   . Contrast Media [Iodinated Diagnostic Agents]   . Nitrofurantoin   . Nitrofurantoin Monohyd Macro   . Penicillins   . Sulfamethoxazole-Trimethoprim       Review of Systems  Constitutional: Positive for fatigue. Negative for fever, chills and unexpected weight change.  Eyes: Negative for visual disturbance.  Respiratory: Negative for cough, chest tightness, shortness of breath and wheezing.   Cardiovascular: Negative for chest pain and palpitations.  Gastrointestinal: Negative for nausea, vomiting, abdominal pain and diarrhea.  Endocrine: Positive for cold intolerance. Negative for polydipsia and polyuria.  Genitourinary: Negative for dysuria.  Neurological: Negative for dizziness, seizures, syncope, weakness, light-headedness and headaches.  Psychiatric/Behavioral: Negative for dysphoric mood.       Objective:   Physical Exam  Constitutional: She is oriented to person, place, and time. She appears well-developed and well-nourished.  Neck: Neck supple. No JVD present.  Cardiovascular:  Irregular rhythm rate controlled  Pulmonary/Chest: Effort normal and breath sounds normal. No respiratory distress. She has no wheezes. She has no rales.  Musculoskeletal:  Trace edema legs bilaterally  Neurological: She is alert and oriented to person, place, and time.  Assessment & Plan:  #1 chronic bilateral neuropathic pain. No history of diabetes. Recent B12 and thyroid normal. We discussed limitations and side effects from all medications for neuropathy such as gabapentin or Lyrica. We elected cautious trial of low-dose gabapentin 100 mg daily at bedtime. Watch for dizziness. Reassess in 4-6 weeks. #2: Cold Tolerance. Recent thyroid  normal. ? Related to her coumadin. #3 Atrial fibrillation- stable rate.  Continue coumadin.

## 2014-03-29 ENCOUNTER — Telehealth: Payer: Self-pay | Admitting: Family Medicine

## 2014-03-29 NOTE — Telephone Encounter (Signed)
Pt states the gabapentin (NEURONTIN) 100 MG capsule is making her dizzy/lightheaded. Pt had a fall to the floor. Also making her urinate lots.  Pt not taking for 2 days, but instructed to inform dr Caryl Neverburchette of any issues.  [facility] [address]

## 2014-03-30 NOTE — Telephone Encounter (Signed)
Stop gabapentin.  We cant go to any lower dose.

## 2014-03-30 NOTE — Telephone Encounter (Signed)
Pt informed

## 2014-04-14 ENCOUNTER — Telehealth: Payer: Self-pay | Admitting: Nurse Practitioner

## 2014-04-14 NOTE — Telephone Encounter (Signed)
Called patient to discuss 3 month lab follow-up.  Patient states she has not been taking Lipitor.  Patient gives an unclear explanation of taking the medication, then stopping, starting back and stopping again so I am uncertain as to how much of the Lipitor she has taken.  I advised the patient to restart the Lipitor to see if she can tolerate it.  Patient is scheduled for f/u with Dr. Elease HashimotoNahser on 5/16 and is also scheduled for repeat fasting labs at this time.  Patient verbalized understanding and agreement.  I advised her to call back with questions or concerns as patient seems to get appointment dates/times confused.

## 2014-04-29 ENCOUNTER — Ambulatory Visit (INDEPENDENT_AMBULATORY_CARE_PROVIDER_SITE_OTHER): Payer: Commercial Managed Care - HMO | Admitting: General Practice

## 2014-04-29 ENCOUNTER — Encounter: Payer: Self-pay | Admitting: Family Medicine

## 2014-04-29 ENCOUNTER — Ambulatory Visit (INDEPENDENT_AMBULATORY_CARE_PROVIDER_SITE_OTHER): Payer: Commercial Managed Care - HMO | Admitting: Family Medicine

## 2014-04-29 VITALS — BP 128/74 | HR 84 | Temp 97.9°F | Wt 155.0 lb

## 2014-04-29 DIAGNOSIS — Z5181 Encounter for therapeutic drug level monitoring: Secondary | ICD-10-CM

## 2014-04-29 DIAGNOSIS — G629 Polyneuropathy, unspecified: Secondary | ICD-10-CM

## 2014-04-29 DIAGNOSIS — E785 Hyperlipidemia, unspecified: Secondary | ICD-10-CM | POA: Insufficient documentation

## 2014-04-29 DIAGNOSIS — M722 Plantar fascial fibromatosis: Secondary | ICD-10-CM | POA: Diagnosis not present

## 2014-04-29 DIAGNOSIS — I10 Essential (primary) hypertension: Secondary | ICD-10-CM | POA: Diagnosis not present

## 2014-04-29 DIAGNOSIS — I4891 Unspecified atrial fibrillation: Secondary | ICD-10-CM

## 2014-04-29 LAB — POCT INR: INR: 1.9

## 2014-04-29 NOTE — Progress Notes (Signed)
Pre visit review using our clinic review tool, if applicable. No additional management support is needed unless otherwise documented below in the visit note. 

## 2014-04-29 NOTE — Patient Instructions (Signed)
Plantar Fasciitis  Plantar fasciitis is a common condition that causes foot pain. It is soreness (inflammation) of the band of tough fibrous tissue on the bottom of the foot that runs from the heel bone (calcaneus) to the ball of the foot. The cause of this soreness may be from excessive standing, poor fitting shoes, running on hard surfaces, being overweight, having an abnormal walk, or overuse (this is common in runners) of the painful foot or feet. It is also common in aerobic exercise dancers and ballet dancers.  SYMPTOMS   Most people with plantar fasciitis complain of:   Severe pain in the morning on the bottom of their foot especially when taking the first steps out of bed. This pain recedes after a few minutes of walking.   Severe pain is experienced also during walking following a long period of inactivity.   Pain is worse when walking barefoot or up stairs  DIAGNOSIS    Your caregiver will diagnose this condition by examining and feeling your foot.   Special tests such as X-rays of your foot, are usually not needed.  PREVENTION    Consult a sports medicine professional before beginning a new exercise program.   Walking programs offer a good workout. With walking there is a lower chance of overuse injuries common to runners. There is less impact and less jarring of the joints.   Begin all new exercise programs slowly. If problems or pain develop, decrease the amount of time or distance until you are at a comfortable level.   Wear good shoes and replace them regularly.   Stretch your foot and the heel cords at the back of the ankle (Achilles tendon) both before and after exercise.   Run or exercise on even surfaces that are not hard. For example, asphalt is better than pavement.   Do not run barefoot on hard surfaces.   If using a treadmill, vary the incline.   Do not continue to workout if you have foot or joint problems. Seek professional help if they do not improve.  HOME CARE INSTRUCTIONS     Avoid activities that cause you pain until you recover.   Use ice or cold packs on the problem or painful areas after working out.   Only take over-the-counter or prescription medicines for pain, discomfort, or fever as directed by your caregiver.   Soft shoe inserts or athletic shoes with air or gel sole cushions may be helpful.   If problems continue or become more severe, consult a sports medicine caregiver or your own health care provider. Cortisone is a potent anti-inflammatory medication that may be injected into the painful area. You can discuss this treatment with your caregiver.  MAKE SURE YOU:    Understand these instructions.   Will watch your condition.   Will get help right away if you are not doing well or get worse.  Document Released: 09/19/2000 Document Revised: 03/19/2011 Document Reviewed: 11/19/2007  [employer] Patient Information 2015 [employer]. This information is not intended to replace advice given to you by your health care provider. Make sure you discuss any questions you have with your health care provider.

## 2014-04-29 NOTE — Progress Notes (Signed)
Subjective:    Patient ID: [name] "[name], female    DOB: 23-Jan-1926, 81 y.o.   MRN: [id number]  HPI Patient is here to discuss several items  Plantar fasciitis left foot. She is using a type of brace without much improvement. She is using heat without relief. Pain is worse with movement and is located on the medial aspect of the plantar fascia. She apparently had corticosteroid injection from podiatrist which did not help much.  She has history of some bilateral neuropathy. Recent B12 levels normal. She was taking gabapentin but had some dizziness and was instructed to stop this. No history of diabetes.  Hyperlipidemia. She was placed on Lipitor by cardiologist several months ago and has not had follow-up.  Chronic atrial fibrillation on Coumadin. INR today 1.9.  Past Medical History  Diagnosis Date  . B12 DEFICIENCY 11/25/2009  . ESSENTIAL HYPERTENSION 09/30/2009  . CHF 01/27/2009  . SMALL BOWEL OBSTRUCTION 05/05/2008  . DIVERTICULITIS OF COLON 01/27/2009  . OSTEOARTHRITIS, KNEE, RIGHT 02/08/2009  . OSTEOPENIA 01/27/2009  . Left ventricular dysfunction   . Hypertension   . Chronic systolic heart failure 07/25/2009    a. Myoview 5/12:  Low risk, no ischemia, apical lateral defect probably breast attenuation, inf HK, EF 45%.  b. Echo 8/12: EF 45-50%, inf/inf-septal and apical HK, mild LVH, mild to mod MR, mild to mod TR, PASP 50;  c.  Echo 3/14:  Mild LVH, EF 20%, diff HK, mild to mod MR, mild LAE, mild to mod reduced RVSF, mild RAE, mild to mod TR, PASP 41  . Hemorrhoids   . Rectal bleeding   . Atrial fib/flutter, transient    Past Surgical History  Procedure Laterality Date  . Colon surgery  2006    Sigmoid colectomy for diverticulitis  . Abdominal hysterectomy  1996    TAHBSO  . Appendectomy  1951  . Tonsillectomy  1966  . Cardiac catheterization  April 14, 2012    nonobstructive CAD; severe LV dysfunction.     reports that she has never smoked. She has never used  smokeless tobacco. She reports that she does not drink alcohol or use illicit drugs. family history includes Emphysema in an other family member; Heart disease in her father, sister, and another family member. Allergies  Allergen Reactions  . Bactrim   . Codeine   . Contrast Media [Iodinated Diagnostic Agents]   . Nitrofurantoin   . Nitrofurantoin Monohyd Macro   . Penicillins   . Sulfamethoxazole-Trimethoprim       Review of Systems  Constitutional: Negative for appetite change and unexpected weight change.  Respiratory: Negative for cough and shortness of breath.   Cardiovascular: Negative for chest pain.  Gastrointestinal: Negative for abdominal pain and blood in stool.  Neurological: Positive for dizziness. Negative for syncope.       Objective:   Physical Exam  Constitutional: She appears well-developed and well-nourished.  Cardiovascular: Normal rate.   Irregular heart rhythm  Pulmonary/Chest: Effort normal and breath sounds normal. No respiratory distress. She has no wheezes. She has no rales.  Musculoskeletal: She exhibits no edema.  Left foot reveals no edema. She has tenderness over the plantar fascia near the calcaneus. No Achilles tenderness. Full range of motion ankle. Foot is warm to touch with good pulses.          Assessment & Plan:  #1 left plantar fasciitis. We've recommended more consistent icing especially after walking. We reviewed stretches. Consider corticosteroid injection if symptoms persist though  patient not interested at this time #2 chronic atrial fibrillation on Coumadin. INR 1.9 today with adjustments made #3 hyperlipidemia. Patient on Lipitor. She sees cardiologist in May. She was requesting lipid panel here and orders provided #4 history of peripheral neuropathy. Intolerant of gabapentin. Would be reluctant to start any medications that could cause dizziness with her age and chronic Coumadin. Her pain is relatively mild we recommended  observation for now

## 2014-05-17 ENCOUNTER — Other Ambulatory Visit: Payer: Self-pay | Admitting: Cardiovascular Disease

## 2014-05-21 ENCOUNTER — Other Ambulatory Visit (INDEPENDENT_AMBULATORY_CARE_PROVIDER_SITE_OTHER): Payer: Commercial Managed Care - HMO

## 2014-05-21 DIAGNOSIS — I10 Essential (primary) hypertension: Secondary | ICD-10-CM | POA: Diagnosis not present

## 2014-05-21 DIAGNOSIS — E785 Hyperlipidemia, unspecified: Secondary | ICD-10-CM

## 2014-05-21 LAB — HEPATIC FUNCTION PANEL
ALBUMIN: 4.2 g/dL (ref 3.5–5.2)
ALT: 20 U/L (ref 0–35)
AST: 25 U/L (ref 0–37)
Alkaline Phosphatase: 53 U/L (ref 39–117)
BILIRUBIN DIRECT: 0.2 mg/dL (ref 0.0–0.3)
Total Bilirubin: 1 mg/dL (ref 0.2–1.2)
Total Protein: 7.3 g/dL (ref 6.0–8.3)

## 2014-05-21 LAB — BASIC METABOLIC PANEL
BUN: 26 mg/dL — ABNORMAL HIGH (ref 6–23)
CALCIUM: 9.8 mg/dL (ref 8.4–10.5)
CO2: 29 mEq/L (ref 19–32)
Chloride: 102 mEq/L (ref 96–112)
Creatinine, Ser: 1.25 mg/dL — ABNORMAL HIGH (ref 0.40–1.20)
GFR: 43.23 mL/min — AB (ref >60.00)
Glucose, Bld: 98 mg/dL (ref 70–99)
Potassium: 4.3 mEq/L (ref 3.5–5.1)
Sodium: 138 mEq/L (ref 135–145)

## 2014-05-21 LAB — LIPID PANEL
Cholesterol: 164 mg/dL (ref 0–200)
HDL: 63.8 mg/dL (ref >39.00)
LDL Cholesterol: 71 mg/dL (ref 0–99)
NONHDL: 100.2
Total CHOL/HDL Ratio: 3
Triglycerides: 147 mg/dL (ref 0.0–149.0)
VLDL: 29.4 mg/dL (ref 0.0–40.0)

## 2014-05-24 ENCOUNTER — Ambulatory Visit (INDEPENDENT_AMBULATORY_CARE_PROVIDER_SITE_OTHER): Payer: Commercial Managed Care - HMO | Admitting: Cardiovascular Disease

## 2014-05-24 ENCOUNTER — Encounter: Payer: Self-pay | Admitting: Cardiovascular Disease

## 2014-05-24 ENCOUNTER — Other Ambulatory Visit (INDEPENDENT_AMBULATORY_CARE_PROVIDER_SITE_OTHER): Payer: Commercial Managed Care - HMO

## 2014-05-24 VITALS — BP 110/86 | HR 79 | Ht 65.5 in | Wt 155.0 lb

## 2014-05-24 DIAGNOSIS — I5022 Chronic systolic (congestive) heart failure: Secondary | ICD-10-CM | POA: Diagnosis not present

## 2014-05-24 DIAGNOSIS — I4891 Unspecified atrial fibrillation: Secondary | ICD-10-CM | POA: Diagnosis not present

## 2014-05-24 DIAGNOSIS — E785 Hyperlipidemia, unspecified: Secondary | ICD-10-CM | POA: Diagnosis not present

## 2014-05-24 NOTE — Patient Instructions (Signed)
Medication Instructions:  Your physician recommends that you continue on your current medications as directed. Please refer to the Current Medication list given to you today.   Labwork: Completed Today - I will call you with the results  Testing/Procedures: None Ordered  Follow-Up: Your physician wants you to follow-up in: 6 months with Dr. Elease HashimotoNahser.  You will receive a reminder letter in the mail two months in advance. If you don't receive a letter, please call our office to schedule the follow-up appointment.

## 2014-05-24 NOTE — Progress Notes (Signed)
Cardiology Office Note   Date:  05/24/2014   ID:  [id number] [place], [place] 02-18-1926, MRN [id number]  PCP:  [name], MD  Cardiologist:   Vesta Mixer, MD   Chief Complaint  Patient presents with  . Atrial Fibrillation   1. Atrial fibrillation 2. Hypertension 3. Benign Positional Vertigo 4. Chronic systolic CHF -  Left ventricle: The cavity size was mildly dilated. Wall thickness was increased in a pattern of mild LVH. The estimated ejection fraction was 20%. Diffuse hypokinesis. - Mitral valve: Mild to moderate regurgitation. - Left atrium: The atrium was mildly dilated. - Right ventricle: Systolic function was mildly to moderately reduced. - Right atrium: The atrium was mildly dilated. - Tricuspid valve: Mild-moderate regurgitation. - Pulmonary arteries: PA peak pressure: 41mm Hg  History of Present Illness:  [name] is a 81 y.o. female with a history of atrial fibrillation. She was cardioverted in October, 2012. She unfortunately has gone back into atrial fibrillation.  She has felt very well. She's not having any episodes of chest pain or shortness breath. She's tolerating the rhythm quite well. Her only complaint is that she's itching from the patches were placed during the cardioversion. She also complains of having very dry and cracking skin on her hands.   Sept. 16, 2013 She has been exercising regularly. She has gained weight since I last saw her ( 11 lbs.) She has no complaints of dyspnea or chest pain.  March 21, 2012:  She was seen by Lorin Picket several weeks ago. She was scheduled have an echocardiogram but she has not had that yet. ( was scheduled but we had a big snow storm). She's been having more shortness breath and more leg edema. Scar decreased her Lasix. Her leg swelling is better. He also has some chest heaviness. She is trying to limit her stair climbing.  April 04, 2012:  Her recent echo shows markedly decreased LV function   Left  ventricle: The cavity size was mildly dilated. Wall thickness was increased in a pattern of mild LVH. The estimated ejection fraction was 20%. Diffuse hypokinesis. - Mitral valve: Mild to moderate regurgitation. - Left atrium: The atrium was mildly dilated. - Right ventricle: Systolic function was mildly to moderately reduced. - Right atrium: The atrium was mildly dilated. - Tricuspid valve: Mild-moderate regurgitation. - Pulmonary arteries: PA peak pressure: 41mm Hg (S).  her myoivew study revealed an apical defect - ? Apical thinning. Low risk study.  She continues to have shortness breath. Not sure that she's having any chest pain. It's very difficult to get an accurate history of her. She seems to ramble in her conversation.  July 02, 2012:  Flow had a cardiac cath since of last year. She developed a rash after the cath She has nonobstructive coronary artery disease. Her ejection fraction is severely depressed with an EF of 20-25%. She continues to have atrial fibrillation with a well-controlled ventricular response. She is on chronic Coumadin therapy.  She goes to the [place] twice a week. She walks to [employer] ([place] to [place],- about 1 mile) She stays very active. She complains of being short of breath but she remains very active.   Dec. 22, 2014:  [name] continues to have more dyspnea. She added an extra Lasix ( now 4 times a day). She does not think that she's breathing any better since increasing her Lasix from 3 times a day to 4 times a day. She still walks on a regular basis except very  bad weather.   04/10/2013:  [name] has done well. She had a brief episode of sharp CP - lasted 15 seconds. She has not been going to the gym. She is exercising in the AM to a CD. Able to workout without any CP or dyspnea.   Oct. 5, 2015:  [name] is seen back for follow up visit. Short of breath - about the same.  Sprained her ankle - had a cortisone injection Had a birthday  recently.  May 24, 2014:  [name] is a 81 y.o. female who presents for her atrial fib     Past Medical History  Diagnosis Date  . B12 DEFICIENCY 11/25/2009  . ESSENTIAL HYPERTENSION 09/30/2009  . CHF 01/27/2009  . SMALL BOWEL OBSTRUCTION 05/05/2008  . DIVERTICULITIS OF COLON 01/27/2009  . OSTEOARTHRITIS, KNEE, RIGHT 02/08/2009  . OSTEOPENIA 01/27/2009  . Left ventricular dysfunction   . Hypertension   . Chronic systolic heart failure 07/25/2009    a. Myoview 5/12:  Low risk, no ischemia, apical lateral defect probably breast attenuation, inf HK, EF 45%.  b. Echo 8/12: EF 45-50%, inf/inf-septal and apical HK, mild LVH, mild to mod MR, mild to mod TR, PASP 50;  c.  Echo 3/14:  Mild LVH, EF 20%, diff HK, mild to mod MR, mild LAE, mild to mod reduced RVSF, mild RAE, mild to mod TR, PASP 41  . Hemorrhoids   . Rectal bleeding   . Atrial fib/flutter, transient     Past Surgical History  Procedure Laterality Date  . Colon surgery  2006    Sigmoid colectomy for diverticulitis  . Abdominal hysterectomy  1996    TAHBSO  . Appendectomy  1951  . Tonsillectomy  1966  . Cardiac catheterization  April 14, 2012    nonobstructive CAD; severe LV dysfunction.      Current Outpatient Prescriptions  Medication Sig Dispense Refill  . atorvastatin (LIPITOR) 20 MG tablet Take 1 tablet (20 mg total) by mouth daily at 6 PM. 90 tablet 3  . furosemide (LASIX) 20 MG tablet Take 1 tablet (20 mg total) by mouth 2 (two) times daily. 180 tablet 11  . GuaiFENesin (MUCUS RELIEF ADULT PO) Take by mouth.    Marland Kitchen. KLOR-CON M10 10 MEQ tablet TAKE TWO TABLETS BY MOUTH ONCE DAILY 60 tablet 5  . loratadine (CLARITIN) 10 MG tablet Take 10 mg by mouth daily.      Marland Kitchen. losartan (COZAAR) 50 MG tablet TAKE ONE TABLET BY MOUTH ONCE DAILY 90 tablet 3  . metoprolol (LOPRESSOR) 100 MG tablet TAKE ONE TABLET BY MOUTH TWICE DAILY 60 tablet 11  . Multiple Vitamin (MULTIVITAMIN) tablet Take 1 tablet by mouth daily.      .  Multiple Vitamins-Minerals (ICAPS PO) Take 2 tablets by mouth daily.     Marland Kitchen. spironolactone (ALDACTONE) 25 MG tablet TAKE ONE-HALF TABLET BY MOUTH ONCE DAILY 45 tablet 1  . warfarin (COUMADIN) 5 MG tablet Take as directed by anticoagulation clinic 30 tablet 3   No current facility-administered medications for this visit.    Allergies:   Bactrim; Codeine; Contrast media; Nitrofurantoin; Nitrofurantoin monohyd macro; Penicillins; and Sulfamethoxazole-trimethoprim    Social History:  The patient  reports that she has never smoked. She has never used smokeless tobacco. She reports that she does not drink alcohol or use illicit drugs.   Family History:  The patient's family history includes Emphysema in an other family member; Heart disease in her father, sister, and another family member.  ROS:  Please see the history of present illness.    Review of Systems: Constitutional:  denies fever, chills, diaphoresis, appetite change and fatigue.  HEENT: denies photophobia, eye pain, redness, hearing loss, ear pain, congestion, sore throat, rhinorrhea, sneezing, neck pain, neck stiffness and tinnitus.  Respiratory: denies SOB, DOE, cough, chest tightness, and wheezing.  Cardiovascular: denies chest pain, palpitations and leg swelling.  Gastrointestinal: denies nausea, vomiting, abdominal pain, diarrhea, constipation, blood in stool.  Genitourinary: denies dysuria, urgency, frequency, hematuria, flank pain and difficulty urinating.  Musculoskeletal: denies  myalgias, back pain, joint swelling, arthralgias and gait problem.   Skin: denies pallor, rash and wound.  Neurological: denies dizziness, seizures, syncope, weakness, light-headedness, numbness and headaches.   Hematological: denies adenopathy, easy bruising, personal or family bleeding history.  Psychiatric/ Behavioral: denies suicidal ideation, mood changes, confusion, nervousness, sleep disturbance and agitation.       All other systems  are reviewed and negative.    PHYSICAL EXAM: VS:  BP 110/86 mmHg  Pulse 79  Ht 5' 5.5" (1.664 m)  Wt 70.308 kg (155 lb)  BMI 25.39 kg/m2 , BMI Body mass index is 25.39 kg/(m^2). GEN: Well nourished, well developed, in no acute distress HEENT: normal Neck: no JVD, carotid bruits, or masses Cardiac: IRR; no murmurs, rubs, or gallops,no edema  Respiratory:  clear to auscultation bilaterally, normal work of breathing GI: soft, nontender, nondistended, + BS MS: no deformity or atrophy Skin: warm and dry, no rash Neuro:  Strength and sensation are intact Psych: normal   EKG:  EKG is ordered today. The ekg ordered today demonstrates  Atrial fib at 79.  Poor R wave progression    Recent Labs: 10/12/2013: Pro B Natriuretic peptide (BNP) 223.0* 12/16/2013: TSH 4.37 05/21/2014: ALT 20; BUN 26*; Creatinine 1.25*; Potassium 4.3; Sodium 138    Lipid Panel    Component Value Date/Time   CHOL 164 05/21/2014 0847   TRIG 147.0 05/21/2014 0847   HDL 63.80 05/21/2014 0847   CHOLHDL 3 05/21/2014 0847   VLDL 29.4 05/21/2014 0847   LDLCALC 71 05/21/2014 0847   LDLDIRECT 142.0 12/16/2013 0857      Wt Readings from Last 3 Encounters:  05/24/14 70.308 kg (155 lb)  04/29/14 70.308 kg (155 lb)  03/18/14 71.215 kg (157 lb)      Other studies Reviewed: Additional studies/ records that were reviewed today include: . Review of the above records demonstrates:    ASSESSMENT AND PLAN:  1. Atrial fibrillation- her rate is well-controlled. Continue current medications.   She's concerned about losing her hair while on metoprolol. This apparently is a side effect. She has a history of chronic systolic congestive heart failure and I do not think that we should change from metoprolol to another medication. Specifically we should avoid diltiazem. 2. Hypertension 3. Benign Positional Vertigo 4. Chronic systolic CHF - - continue current medications.   Current medicines are reviewed at length with  the patient today.  The patient does not have concerns regarding medicines.  The following changes have been made:  no change  Labs/ tests ordered today include:  No orders of the defined types were placed in this encounter.     Disposition:   FU with me in 6 months      Taraji Mungo, Deloris Ping, MD  05/24/2014 9:49 AM    [facility] [address] [place], [place], [place]  [address] Phone: [phone]; Fax: [phone]   [employer] Office  [address] [place]  [id number] [phone]    Fax [phone]

## 2014-05-27 ENCOUNTER — Ambulatory Visit (INDEPENDENT_AMBULATORY_CARE_PROVIDER_SITE_OTHER): Payer: Commercial Managed Care - HMO | Admitting: General Practice

## 2014-05-27 DIAGNOSIS — Z7901 Long term (current) use of anticoagulants: Secondary | ICD-10-CM

## 2014-05-27 DIAGNOSIS — Z5181 Encounter for therapeutic drug level monitoring: Secondary | ICD-10-CM | POA: Diagnosis not present

## 2014-05-27 DIAGNOSIS — I4891 Unspecified atrial fibrillation: Secondary | ICD-10-CM | POA: Diagnosis not present

## 2014-05-27 LAB — POCT INR: INR: 2.2

## 2014-05-27 NOTE — Progress Notes (Signed)
Pre visit review using our clinic review tool, if applicable. No additional management support is needed unless otherwise documented below in the visit note. 

## 2014-06-01 ENCOUNTER — Ambulatory Visit (INDEPENDENT_AMBULATORY_CARE_PROVIDER_SITE_OTHER): Payer: Commercial Managed Care - HMO | Admitting: Podiatry

## 2014-06-01 ENCOUNTER — Encounter: Payer: Self-pay | Admitting: Podiatry

## 2014-06-01 VITALS — BP 121/72 | HR 86 | Resp 12

## 2014-06-01 DIAGNOSIS — M722 Plantar fascial fibromatosis: Secondary | ICD-10-CM | POA: Diagnosis not present

## 2014-06-01 DIAGNOSIS — M7670 Peroneal tendinitis, unspecified leg: Secondary | ICD-10-CM | POA: Diagnosis not present

## 2014-06-01 DIAGNOSIS — M7672 Peroneal tendinitis, left leg: Secondary | ICD-10-CM

## 2014-06-01 NOTE — Progress Notes (Signed)
She presents today for follow-up of her peroneal tendinitis left foot. She also presents with left heel pain.  Objective: Vital signs are stable she is alert and oriented 3. She has mild tenderness on palpation of the peroneal tendons greater tenderness on palpation of the medial tubercle of her left heel.  Assessment: Suspect plantar fasciitis with lateral compensatory syndrome resulting in peroneal tendinitis secondarily left.  Plan: Injected her plantar fascial today with Kenalog and local anesthesia. Discussed appropriate shoe gear stretching exercises ice therapy and shoe gear modifications. Follow-up with her in 1

## 2014-06-10 ENCOUNTER — Other Ambulatory Visit: Payer: Self-pay | Admitting: Family Medicine

## 2014-06-11 ENCOUNTER — Telehealth: Payer: Self-pay | Admitting: Family Medicine

## 2014-06-11 NOTE — Telephone Encounter (Signed)
Results are in chart

## 2014-06-11 NOTE — Telephone Encounter (Signed)
I do not see patient results in chart.

## 2014-06-11 NOTE — Telephone Encounter (Signed)
Pt said she had labs on 05/21/14 and has not received her results. Would like a call back with results of cholesterol

## 2014-06-15 ENCOUNTER — Other Ambulatory Visit: Payer: Self-pay | Admitting: Cardiovascular Disease

## 2014-06-24 ENCOUNTER — Ambulatory Visit (INDEPENDENT_AMBULATORY_CARE_PROVIDER_SITE_OTHER): Payer: Commercial Managed Care - HMO | Admitting: General Practice

## 2014-06-24 DIAGNOSIS — I4891 Unspecified atrial fibrillation: Secondary | ICD-10-CM

## 2014-06-24 DIAGNOSIS — Z7901 Long term (current) use of anticoagulants: Secondary | ICD-10-CM

## 2014-06-24 DIAGNOSIS — Z5181 Encounter for therapeutic drug level monitoring: Secondary | ICD-10-CM

## 2014-06-24 LAB — POCT INR: INR: 2.2

## 2014-06-24 NOTE — Progress Notes (Signed)
Pre visit review using our clinic review tool, if applicable. No additional management support is needed unless otherwise documented below in the visit note. 

## 2014-06-30 ENCOUNTER — Encounter: Payer: Self-pay | Admitting: Gastroenterology

## 2014-07-07 DIAGNOSIS — H04123 Dry eye syndrome of bilateral lacrimal glands: Secondary | ICD-10-CM | POA: Diagnosis not present

## 2014-07-07 DIAGNOSIS — H3531 Nonexudative age-related macular degeneration: Secondary | ICD-10-CM | POA: Diagnosis not present

## 2014-07-22 ENCOUNTER — Ambulatory Visit (INDEPENDENT_AMBULATORY_CARE_PROVIDER_SITE_OTHER): Payer: Commercial Managed Care - HMO | Admitting: General Practice

## 2014-07-22 DIAGNOSIS — Z5181 Encounter for therapeutic drug level monitoring: Secondary | ICD-10-CM

## 2014-07-22 DIAGNOSIS — I4891 Unspecified atrial fibrillation: Secondary | ICD-10-CM

## 2014-07-22 DIAGNOSIS — Z7901 Long term (current) use of anticoagulants: Secondary | ICD-10-CM

## 2014-07-22 LAB — POCT INR: INR: 2.6

## 2014-07-22 NOTE — Progress Notes (Signed)
Pre visit review using our clinic review tool, if applicable. No additional management support is needed unless otherwise documented below in the visit note. 

## 2014-07-30 ENCOUNTER — Ambulatory Visit (INDEPENDENT_AMBULATORY_CARE_PROVIDER_SITE_OTHER): Payer: Commercial Managed Care - HMO | Admitting: Family Medicine

## 2014-07-30 ENCOUNTER — Encounter: Payer: Self-pay | Admitting: Family Medicine

## 2014-07-30 VITALS — BP 126/78 | HR 86 | Temp 98.6°F | Wt 157.0 lb

## 2014-07-30 DIAGNOSIS — L659 Nonscarring hair loss, unspecified: Secondary | ICD-10-CM

## 2014-07-30 DIAGNOSIS — M25572 Pain in left ankle and joints of left foot: Secondary | ICD-10-CM

## 2014-07-30 DIAGNOSIS — M722 Plantar fascial fibromatosis: Secondary | ICD-10-CM | POA: Diagnosis not present

## 2014-07-30 LAB — TSH: TSH: 3.82 u[IU]/mL (ref 0.35–4.50)

## 2014-07-30 NOTE — Progress Notes (Signed)
Pre visit review using our clinic review tool, if applicable. No additional management support is needed unless otherwise documented below in the visit note. 

## 2014-07-30 NOTE — Patient Instructions (Signed)
Plantar Fasciitis  Plantar fasciitis is a common condition that causes foot pain. It is soreness (inflammation) of the band of tough fibrous tissue on the bottom of the foot that runs from the heel bone (calcaneus) to the ball of the foot. The cause of this soreness may be from excessive standing, poor fitting shoes, running on hard surfaces, being overweight, having an abnormal walk, or overuse (this is common in runners) of the painful foot or feet. It is also common in aerobic exercise dancers and ballet dancers.  SYMPTOMS   Most people with plantar fasciitis complain of:   Severe pain in the morning on the bottom of their foot especially when taking the first steps out of bed. This pain recedes after a few minutes of walking.   Severe pain is experienced also during walking following a long period of inactivity.   Pain is worse when walking barefoot or up stairs  DIAGNOSIS    Your caregiver will diagnose this condition by examining and feeling your foot.   Special tests such as X-rays of your foot, are usually not needed.  PREVENTION    Consult a sports medicine professional before beginning a new exercise program.   Walking programs offer a good workout. With walking there is a lower chance of overuse injuries common to runners. There is less impact and less jarring of the joints.   Begin all new exercise programs slowly. If problems or pain develop, decrease the amount of time or distance until you are at a comfortable level.   Wear good shoes and replace them regularly.   Stretch your foot and the heel cords at the back of the ankle (Achilles tendon) both before and after exercise.   Run or exercise on even surfaces that are not hard. For example, asphalt is better than pavement.   Do not run barefoot on hard surfaces.   If using a treadmill, vary the incline.   Do not continue to workout if you have foot or joint problems. Seek professional help if they do not improve.  HOME CARE INSTRUCTIONS     Avoid activities that cause you pain until you recover.   Use ice or cold packs on the problem or painful areas after working out.   Only take over-the-counter or prescription medicines for pain, discomfort, or fever as directed by your caregiver.   Soft shoe inserts or athletic shoes with air or gel sole cushions may be helpful.   If problems continue or become more severe, consult a sports medicine caregiver or your own health care provider. Cortisone is a potent anti-inflammatory medication that may be injected into the painful area. You can discuss this treatment with your caregiver.  MAKE SURE YOU:    Understand these instructions.   Will watch your condition.   Will get help right away if you are not doing well or get worse.  Document Released: 09/19/2000 Document Revised: 03/19/2011 Document Reviewed: 11/19/2007  [employer] Patient Information 2015 [employer]. This information is not intended to replace advice given to you by your health care provider. Make sure you discuss any questions you have with your health care provider.

## 2014-07-30 NOTE — Progress Notes (Signed)
Subjective:    Patient ID: [name] "Taj Arteaga, female    DOB: [birth year], 81 y.o.   MRN: [id number]  HPI Patient with multiple issues  She complains of some thinning of the hair for several months. She had TSH which was normal but high end of normal back in December at 4.37. Denies any increased fatigue issues. Her hair loss is more generalized. She tried Rogaine only briefly.  Persistent left ankle pain. Previous x-rays per podiatry reportedly normal. She has pain mostly laterally. No recent injury. Walking in her brace.  Continued plantar fasciitis pain. She's tried icing and stretches without improvement. She had steroid injection per podiatry which did not help.  Past Medical History  Diagnosis Date  . B12 DEFICIENCY 11/25/2009  . ESSENTIAL HYPERTENSION 09/30/2009  . CHF 01/27/2009  . SMALL BOWEL OBSTRUCTION 05/05/2008  . DIVERTICULITIS OF COLON 01/27/2009  . OSTEOARTHRITIS, KNEE, RIGHT 02/08/2009  . OSTEOPENIA 01/27/2009  . Left ventricular dysfunction   . Hypertension   . Chronic systolic heart failure 07/25/2009    a. Myoview 5/12:  Low risk, no ischemia, apical lateral defect probably breast attenuation, inf HK, EF 45%.  b. Echo 8/12: EF 45-50%, inf/inf-septal and apical HK, mild LVH, mild to mod MR, mild to mod TR, PASP 50;  c.  Echo 3/14:  Mild LVH, EF 20%, diff HK, mild to mod MR, mild LAE, mild to mod reduced RVSF, mild RAE, mild to mod TR, PASP 41  . Hemorrhoids   . Rectal bleeding   . Atrial fib/flutter, transient    Past Surgical History  Procedure Laterality Date  . Colon surgery  2006    Sigmoid colectomy for diverticulitis  . Abdominal hysterectomy  1996    TAHBSO  . Appendectomy  1951  . Tonsillectomy  1966  . Cardiac catheterization  April 14, 2012    nonobstructive CAD; severe LV dysfunction.     reports that she has never smoked. She has never used smokeless tobacco. She reports that she does not drink alcohol or use illicit drugs. family history includes  Emphysema in an other family member; Heart disease in her father, sister, and another family member. Allergies  Allergen Reactions  . Bactrim   . Codeine   . Contrast Media [Iodinated Diagnostic Agents]   . Nitrofurantoin   . Nitrofurantoin Monohyd Macro   . Penicillins   . Sulfamethoxazole-Trimethoprim       Review of Systems  Constitutional: Negative for fatigue.  Eyes: Negative for visual disturbance.  Respiratory: Negative for cough, chest tightness, shortness of breath and wheezing.   Cardiovascular: Negative for chest pain, palpitations and leg swelling.  Neurological: Negative for dizziness, seizures, syncope, weakness, light-headedness and headaches.       Objective:   Physical Exam  Constitutional: She appears well-developed and well-nourished. No distress.  Neck: Neck supple. No JVD present. No thyromegaly present.  Cardiovascular: Normal rate.   Irregular rhythm rate controlled  Pulmonary/Chest: Effort normal and breath sounds normal. No respiratory distress. She has no wheezes. She has no rales.  Musculoskeletal:  Left plantar fascia is tender to palpation. She has some minimal tenderness inferior to the left lateral malleolus. Full range of motion ankle. No Achilles tenderness.          Assessment & Plan:  #1 generalized alopecia. Repeat TSH to make sure she is not developing hypothyroidism. #2 left plantar fasciitis. Continue icing and stretches. Offered steroid injection and she declines. She is already tried heel cups without improvement. #3  left ankle pain laterally. Suspect tendinitis. Regular icing, especially after activities.

## 2014-08-19 ENCOUNTER — Other Ambulatory Visit: Payer: Self-pay | Admitting: General Practice

## 2014-08-19 ENCOUNTER — Ambulatory Visit (INDEPENDENT_AMBULATORY_CARE_PROVIDER_SITE_OTHER): Payer: Commercial Managed Care - HMO | Admitting: General Practice

## 2014-08-19 DIAGNOSIS — Z5181 Encounter for therapeutic drug level monitoring: Secondary | ICD-10-CM | POA: Diagnosis not present

## 2014-08-19 DIAGNOSIS — I4891 Unspecified atrial fibrillation: Secondary | ICD-10-CM | POA: Diagnosis not present

## 2014-08-19 LAB — POCT INR: INR: 1.9

## 2014-08-19 MED ORDER — WARFARIN SODIUM 5 MG PO TABS: ORAL_TABLET | ORAL | Status: DC

## 2014-08-19 NOTE — Progress Notes (Signed)
Pre visit review using our clinic review tool, if applicable. No additional management support is needed unless otherwise documented below in the visit note. 

## 2014-09-06 ENCOUNTER — Telehealth: Payer: Self-pay | Admitting: *Deleted

## 2014-09-06 NOTE — Telephone Encounter (Signed)
Pt requests she would like a copy of her x-rays.  Told pt she could pick them up at her convenience.

## 2014-09-08 DIAGNOSIS — M79673 Pain in unspecified foot: Secondary | ICD-10-CM

## 2014-09-10 DIAGNOSIS — M25672 Stiffness of left ankle, not elsewhere classified: Secondary | ICD-10-CM | POA: Diagnosis not present

## 2014-09-10 DIAGNOSIS — M2141 Flat foot [pes planus] (acquired), right foot: Secondary | ICD-10-CM | POA: Diagnosis not present

## 2014-09-10 DIAGNOSIS — M2142 Flat foot [pes planus] (acquired), left foot: Secondary | ICD-10-CM | POA: Diagnosis not present

## 2014-09-10 DIAGNOSIS — M722 Plantar fascial fibromatosis: Secondary | ICD-10-CM | POA: Diagnosis not present

## 2014-09-10 DIAGNOSIS — M25472 Effusion, left ankle: Secondary | ICD-10-CM | POA: Diagnosis not present

## 2014-09-10 DIAGNOSIS — M79672 Pain in left foot: Secondary | ICD-10-CM | POA: Diagnosis not present

## 2014-09-10 DIAGNOSIS — M25572 Pain in left ankle and joints of left foot: Secondary | ICD-10-CM | POA: Diagnosis not present

## 2014-09-13 ENCOUNTER — Other Ambulatory Visit: Payer: Self-pay | Admitting: Family Medicine

## 2014-09-16 ENCOUNTER — Ambulatory Visit (INDEPENDENT_AMBULATORY_CARE_PROVIDER_SITE_OTHER): Payer: Commercial Managed Care - HMO | Admitting: General Practice

## 2014-09-16 DIAGNOSIS — I4891 Unspecified atrial fibrillation: Secondary | ICD-10-CM

## 2014-09-16 DIAGNOSIS — Z5181 Encounter for therapeutic drug level monitoring: Secondary | ICD-10-CM

## 2014-09-16 LAB — POCT INR: INR: 1.6

## 2014-09-16 NOTE — Progress Notes (Signed)
Pre visit review using our clinic review tool, if applicable. No additional management support is needed unless otherwise documented below in the visit note. 

## 2014-09-29 ENCOUNTER — Ambulatory Visit (INDEPENDENT_AMBULATORY_CARE_PROVIDER_SITE_OTHER): Payer: Commercial Managed Care - HMO | Admitting: Family Medicine

## 2014-09-29 ENCOUNTER — Encounter: Payer: Self-pay | Admitting: Family Medicine

## 2014-09-29 VITALS — BP 110/70 | HR 80 | Temp 98.0°F | Ht 65.5 in | Wt 153.2 lb

## 2014-09-29 DIAGNOSIS — I4891 Unspecified atrial fibrillation: Secondary | ICD-10-CM | POA: Diagnosis not present

## 2014-09-29 DIAGNOSIS — Z23 Encounter for immunization: Secondary | ICD-10-CM | POA: Diagnosis not present

## 2014-09-29 DIAGNOSIS — I10 Essential (primary) hypertension: Secondary | ICD-10-CM | POA: Diagnosis not present

## 2014-09-29 DIAGNOSIS — E785 Hyperlipidemia, unspecified: Secondary | ICD-10-CM

## 2014-09-29 MED ORDER — ROSUVASTATIN CALCIUM 10 MG PO TABS: 10.0000 mg | ORAL_TABLET | Freq: Every day | ORAL | Status: DC

## 2014-09-29 NOTE — Addendum Note (Signed)
Addended by: Griselda Miner E on: 09/29/2014 09:01 AM   Modules accepted: Orders

## 2014-09-29 NOTE — Progress Notes (Signed)
Pre visit review using our clinic review tool, if applicable. No additional management support is needed unless otherwise documented below in the visit note. 

## 2014-09-29 NOTE — Progress Notes (Signed)
Subjective:    Patient ID: [name], female    DOB: [birth year], 81 y.o.   MRN: [id number]  HPI Patient seen for medical follow-up  Ongoing problems with plantar fasciitis and left ankle pain. She is seeing podiatrist and apparently had a couple of steroid injections which have not helped. She continues to ambulate several miles per day  Atrial fibrillation on Coumadin. No recent bleeding complications. She is getting her Coumadin monitored regularly. No chest pains.  Hyperlipidemia. Patient took her self off Lipitor recently. She was convinced was having lightheadedness and dizziness related to this. She states that as soon as she stopped the Lipitor her symptoms away and after restarting this her symptoms came back again. She is now not taking Lipitor. She denies any other statin use.  Hypertension treated with several medications including losartan, furosemide, metoprolol, and Aldactone. No consistent orthostasis symptoms  Past Medical History  Diagnosis Date  . B12 DEFICIENCY 11/25/2009  . ESSENTIAL HYPERTENSION 09/30/2009  . CHF 01/27/2009  . SMALL BOWEL OBSTRUCTION 05/05/2008  . DIVERTICULITIS OF COLON 01/27/2009  . OSTEOARTHRITIS, KNEE, RIGHT 02/08/2009  . OSTEOPENIA 01/27/2009  . Left ventricular dysfunction   . Hypertension   . Chronic systolic heart failure 07/25/2009    a. Myoview 5/12:  Low risk, no ischemia, apical lateral defect probably breast attenuation, inf HK, EF 45%.  b. Echo 8/12: EF 45-50%, inf/inf-septal and apical HK, mild LVH, mild to mod MR, mild to mod TR, PASP 50;  c.  Echo 3/14:  Mild LVH, EF 20%, diff HK, mild to mod MR, mild LAE, mild to mod reduced RVSF, mild RAE, mild to mod TR, PASP 41  . Hemorrhoids   . Rectal bleeding   . Atrial fib/flutter, transient    Past Surgical History  Procedure Laterality Date  . Colon surgery  2006    Sigmoid colectomy for diverticulitis  . Abdominal hysterectomy  1996    TAHBSO  . Appendectomy  1951  .  Tonsillectomy  1966  . Cardiac catheterization  April 14, 2012    nonobstructive CAD; severe LV dysfunction.     reports that she has never smoked. She has never used smokeless tobacco. She reports that she does not drink alcohol or use illicit drugs. family history includes Emphysema in an other family member; Heart disease in her father, sister, and another family member. Allergies  Allergen Reactions  . Bactrim   . Codeine   . Contrast Media [Iodinated Diagnostic Agents]   . Nitrofurantoin   . Nitrofurantoin Monohyd Macro   . Penicillins   . Sulfamethoxazole-Trimethoprim       Review of Systems  Constitutional: Negative for fatigue and unexpected weight change.  Eyes: Negative for visual disturbance.  Respiratory: Negative for cough, chest tightness, shortness of breath and wheezing.   Cardiovascular: Negative for chest pain, palpitations and leg swelling.  Endocrine: Negative for polydipsia and polyuria.  Genitourinary: Negative for dysuria.  Neurological: Positive for light-headedness. Negative for dizziness, seizures, syncope, weakness and headaches.       Objective:   Physical Exam  Constitutional: She appears well-developed and well-nourished.  Neck: Neck supple. No JVD present.  Cardiovascular: Normal rate.   Irregular rhythm rate controlled  Pulmonary/Chest: Effort normal and breath sounds normal. No respiratory distress. She has no wheezes. She has no rales.  Musculoskeletal: She exhibits no edema.          Assessment & Plan:  #1 hyperlipidemia. Patient is convinced she is having lightheadedness related to Lipitor.  Her symptoms apparently resolved after stopping that. We convinced her to try Crestor 10 mg once daily and check lipid panel and hepatic panel in 2 months #2 chronic atrial fibrillation rate controlled on Coumadin. Continue regular follow-up with Coumadin clinic #3 health maintenance. Flu vaccine given. Her pneumonia vaccinations are up-to-date #4  hypertension stable and at goal. Check basic metabolic panel at follow-up

## 2014-09-29 NOTE — Patient Instructions (Signed)
STOP the Lipitor and START Crestor one daily We will plan to repeat the labs in 2 months.

## 2014-10-08 DIAGNOSIS — M79672 Pain in left foot: Secondary | ICD-10-CM | POA: Diagnosis not present

## 2014-10-08 DIAGNOSIS — M25472 Effusion, left ankle: Secondary | ICD-10-CM | POA: Diagnosis not present

## 2014-10-08 DIAGNOSIS — M25672 Stiffness of left ankle, not elsewhere classified: Secondary | ICD-10-CM | POA: Diagnosis not present

## 2014-10-08 DIAGNOSIS — M25572 Pain in left ankle and joints of left foot: Secondary | ICD-10-CM | POA: Diagnosis not present

## 2014-10-14 ENCOUNTER — Ambulatory Visit: Payer: Commercial Managed Care - HMO

## 2014-10-21 ENCOUNTER — Ambulatory Visit (INDEPENDENT_AMBULATORY_CARE_PROVIDER_SITE_OTHER): Payer: Commercial Managed Care - HMO | Admitting: General Practice

## 2014-10-21 DIAGNOSIS — Z5181 Encounter for therapeutic drug level monitoring: Secondary | ICD-10-CM | POA: Diagnosis not present

## 2014-10-21 DIAGNOSIS — I4891 Unspecified atrial fibrillation: Secondary | ICD-10-CM

## 2014-10-21 DIAGNOSIS — Z7901 Long term (current) use of anticoagulants: Secondary | ICD-10-CM | POA: Diagnosis not present

## 2014-10-21 LAB — POCT INR: INR: 2.4

## 2014-10-21 NOTE — Progress Notes (Signed)
Pre visit review using our clinic review tool, if applicable. No additional management support is needed unless otherwise documented below in the visit note. 

## 2014-10-29 ENCOUNTER — Other Ambulatory Visit: Payer: Self-pay | Admitting: Cardiovascular Disease

## 2014-10-29 DIAGNOSIS — M25572 Pain in left ankle and joints of left foot: Secondary | ICD-10-CM | POA: Diagnosis not present

## 2014-11-05 DIAGNOSIS — M25572 Pain in left ankle and joints of left foot: Secondary | ICD-10-CM | POA: Diagnosis not present

## 2014-11-09 DIAGNOSIS — M25572 Pain in left ankle and joints of left foot: Secondary | ICD-10-CM | POA: Diagnosis not present

## 2014-11-12 DIAGNOSIS — M25572 Pain in left ankle and joints of left foot: Secondary | ICD-10-CM | POA: Diagnosis not present

## 2014-11-14 ENCOUNTER — Other Ambulatory Visit: Payer: Self-pay | Admitting: Cardiovascular Disease

## 2014-11-16 DIAGNOSIS — M25572 Pain in left ankle and joints of left foot: Secondary | ICD-10-CM | POA: Diagnosis not present

## 2014-11-18 ENCOUNTER — Ambulatory Visit (INDEPENDENT_AMBULATORY_CARE_PROVIDER_SITE_OTHER): Payer: Commercial Managed Care - HMO | Admitting: General Practice

## 2014-11-18 DIAGNOSIS — I4891 Unspecified atrial fibrillation: Secondary | ICD-10-CM | POA: Diagnosis not present

## 2014-11-18 DIAGNOSIS — Z5181 Encounter for therapeutic drug level monitoring: Secondary | ICD-10-CM

## 2014-11-18 DIAGNOSIS — Z7901 Long term (current) use of anticoagulants: Secondary | ICD-10-CM

## 2014-11-18 LAB — POCT INR: INR: 2.3

## 2014-11-18 NOTE — Progress Notes (Signed)
Pre visit review using our clinic review tool, if applicable. No additional management support is needed unless otherwise documented below in the visit note. 

## 2014-11-19 DIAGNOSIS — M25572 Pain in left ankle and joints of left foot: Secondary | ICD-10-CM | POA: Diagnosis not present

## 2014-11-21 ENCOUNTER — Other Ambulatory Visit: Payer: Self-pay | Admitting: Cardiovascular Disease

## 2014-11-23 ENCOUNTER — Ambulatory Visit (INDEPENDENT_AMBULATORY_CARE_PROVIDER_SITE_OTHER): Payer: Commercial Managed Care - HMO | Admitting: Cardiovascular Disease

## 2014-11-23 ENCOUNTER — Encounter: Payer: Self-pay | Admitting: Cardiovascular Disease

## 2014-11-23 VITALS — BP 122/86 | HR 80 | Ht 65.5 in | Wt 159.1 lb

## 2014-11-23 DIAGNOSIS — I5022 Chronic systolic (congestive) heart failure: Secondary | ICD-10-CM | POA: Diagnosis not present

## 2014-11-23 DIAGNOSIS — I4891 Unspecified atrial fibrillation: Secondary | ICD-10-CM | POA: Diagnosis not present

## 2014-11-23 NOTE — Patient Instructions (Signed)

## 2014-11-23 NOTE — Progress Notes (Signed)
Cardiology Office Note   Date:  11/23/2014   ID:  [name], DOB 01/23/1926, MRN [id number]  PCP:  [name], MD  Cardiologist:   Vesta Mixer, MD   Chief Complaint  Patient presents with  . Follow-up    chf   1. Atrial fibrillation 2. Hypertension 3. Benign Positional Vertigo 4. Chronic systolic CHF -  Left ventricle: The cavity size was mildly dilated. Wall thickness was increased in a pattern of mild LVH. The estimated ejection fraction was 20%. Diffuse hypokinesis. - Mitral valve: Mild to moderate regurgitation. - Left atrium: The atrium was mildly dilated. - Right ventricle: Systolic function was mildly to moderately reduced. - Right atrium: The atrium was mildly dilated. - Tricuspid valve: Mild-moderate regurgitation. - Pulmonary arteries: PA peak pressure: 41mm Hg  History of Present Illness:  [name] is a 81 y.o. female with a history of atrial fibrillation. She was cardioverted in October, 2012. She unfortunately has gone back into atrial fibrillation.  She has felt very well. She's not having any episodes of chest pain or shortness breath. She's tolerating the rhythm quite well. Her only complaint is that she's itching from the patches were placed during the cardioversion. She also complains of having very dry and cracking skin on her hands.   Sept. 16, 2013 She has been exercising regularly. She has gained weight since I last saw her ( 11 lbs.) She has no complaints of dyspnea or chest pain.  March 21, 2012:  She was seen by Lorin Picket several weeks ago. She was scheduled have an echocardiogram but she has not had that yet. ( was scheduled but we had a big snow storm). She's been having more shortness breath and more leg edema. Scar decreased her Lasix. Her leg swelling is better. He also has some chest heaviness. She is trying to limit her stair climbing.  April 04, 2012:  Her recent echo shows markedly decreased LV function   Left  ventricle: The cavity size was mildly dilated. Wall thickness was increased in a pattern of mild LVH. The estimated ejection fraction was 20%. Diffuse hypokinesis. - Mitral valve: Mild to moderate regurgitation. - Left atrium: The atrium was mildly dilated. - Right ventricle: Systolic function was mildly to moderately reduced. - Right atrium: The atrium was mildly dilated. - Tricuspid valve: Mild-moderate regurgitation. - Pulmonary arteries: PA peak pressure: 41mm Hg (S).  her myoivew study revealed an apical defect - ? Apical thinning. Low risk study.  She continues to have shortness breath. Not sure that she's having any chest pain. It's very difficult to get an accurate history of her. She seems to ramble in her conversation.  July 02, 2012:  Flow had a cardiac cath since of last year. She developed a rash after the cath She has nonobstructive coronary artery disease. Her ejection fraction is severely depressed with an EF of 20-25%. She continues to have atrial fibrillation with a well-controlled ventricular response. She is on chronic Coumadin therapy.  She goes to the [place] twice a week. She walks to [employer] ([place] to [place],- about 1 mile) She stays very active. She complains of being short of breath but she remains very active.   Dec. 22, 2014:  [name] continues to have more dyspnea. She added an extra Lasix ( now 4 times a day). She does not think that she's breathing any better since increasing her Lasix from 3 times a day to 4 times a day. She still walks on a regular basis  except very bad weather.   04/10/2013:  [name] has done well. She had a brief episode of sharp CP - lasted 15 seconds. She has not been going to the gym. She is exercising in the AM to a CD. Able to workout without any CP or dyspnea.   Oct. 5, 2015:  [name] is seen back for follow up visit. Short of breath - about the same.  Sprained her ankle - had a cortisone injection Had a birthday  recently.  May 24, 2014:  [name] [name] is a 81 y.o. female who presents for her atrial fib  Nov. 15, 2016:  Complains that the metoprolol is thinning her hair.   Breathing is ok Goes to the gym,  Still gets out of breath  Has plantar fascitis.    Past Medical History  Diagnosis Date  . B12 DEFICIENCY 11/25/2009  . ESSENTIAL HYPERTENSION 09/30/2009  . CHF 01/27/2009  . SMALL BOWEL OBSTRUCTION 05/05/2008  . DIVERTICULITIS OF COLON 01/27/2009  . OSTEOARTHRITIS, KNEE, RIGHT 02/08/2009  . OSTEOPENIA 01/27/2009  . Left ventricular dysfunction   . Hypertension   . Chronic systolic heart failure (HCC) 07/25/2009    a. Myoview 5/12:  Low risk, no ischemia, apical lateral defect probably breast attenuation, inf HK, EF 45%.  b. Echo 8/12: EF 45-50%, inf/inf-septal and apical HK, mild LVH, mild to mod MR, mild to mod TR, PASP 50;  c.  Echo 3/14:  Mild LVH, EF 20%, diff HK, mild to mod MR, mild LAE, mild to mod reduced RVSF, mild RAE, mild to mod TR, PASP 41  . Hemorrhoids   . Rectal bleeding   . Atrial fib/flutter, transient     Past Surgical History  Procedure Laterality Date  . Colon surgery  2006    Sigmoid colectomy for diverticulitis  . Abdominal hysterectomy  1996    TAHBSO  . Appendectomy  1951  . Tonsillectomy  1966  . Cardiac catheterization  April 14, 2012    nonobstructive CAD; severe LV dysfunction.      Current Outpatient Prescriptions  Medication Sig Dispense Refill  . furosemide (LASIX) 20 MG tablet TAKE ONE TABLET BY MOUTH TWICE DAILY 180 tablet 3  . GuaiFENesin (MUCUS RELIEF ADULT PO) Take 1 tablet by mouth daily.     Marland Kitchen. KLOR-CON M10 10 MEQ tablet TAKE TWO TABLETS BY MOUTH ONCE DAILY 60 tablet 5  . loratadine (CLARITIN) 10 MG tablet Take 10 mg by mouth daily.      Marland Kitchen. losartan (COZAAR) 50 MG tablet TAKE ONE TABLET BY MOUTH ONCE DAILY 90 tablet 2  . metoprolol (LOPRESSOR) 100 MG tablet TAKE ONE TABLET BY MOUTH TWICE DAILY 60 tablet 11  . Multiple Vitamin  (MULTIVITAMIN) tablet Take 1 tablet by mouth daily.      . Multiple Vitamins-Minerals (ICAPS PO) Take 2 tablets by mouth daily.     . rosuvastatin (CRESTOR) 10 MG tablet Take 1 tablet (10 mg total) by mouth daily. 90 tablet 3  . spironolactone (ALDACTONE) 25 MG tablet TAKE ONE-HALF TABLET BY MOUTH ONCE DAILY 45 tablet 3  . warfarin (COUMADIN) 5 MG tablet Take as directed by anticoagulation clinic 30 tablet 3   No current facility-administered medications for this visit.    Allergies:   Bactrim; Codeine; Contrast media; Nitrofurantoin; Nitrofurantoin monohyd macro; Penicillins; and Sulfamethoxazole-trimethoprim    Social History:  The patient  reports that she has never smoked. She has never used smokeless tobacco. She reports that she does not drink alcohol or use  illicit drugs.   Family History:  The patient's family history includes Emphysema in an other family member; Heart disease in her father, sister, and another family member.    ROS:  Please see the history of present illness.    Review of Systems: Constitutional:  denies fever, chills, diaphoresis, appetite change and fatigue.  HEENT: denies photophobia, eye pain, redness, hearing loss, ear pain, congestion, sore throat, rhinorrhea, sneezing, neck pain, neck stiffness and tinnitus.  Respiratory: denies SOB, DOE, cough, chest tightness, and wheezing.  Cardiovascular: denies chest pain, palpitations and leg swelling.  Gastrointestinal: denies nausea, vomiting, abdominal pain, diarrhea, constipation, blood in stool.  Genitourinary: denies dysuria, urgency, frequency, hematuria, flank pain and difficulty urinating.  Musculoskeletal: denies  myalgias, back pain, joint swelling, arthralgias and gait problem.   Skin: denies pallor, rash and wound.  Neurological: denies dizziness, seizures, syncope, weakness, light-headedness, numbness and headaches.   Hematological: denies adenopathy, easy bruising, personal or family bleeding  history.  Psychiatric/ Behavioral: denies suicidal ideation, mood changes, confusion, nervousness, sleep disturbance and agitation.       All other systems are reviewed and negative.    PHYSICAL EXAM: VS:  BP 122/86 mmHg  Pulse 80  Ht 5' 5.5" (1.664 m)  Wt 159 lb 1.9 oz (72.176 kg)  BMI 26.07 kg/m2 , BMI Body mass index is 26.07 kg/(m^2). GEN: Well nourished, well developed, in no acute distress HEENT: normal Neck: no JVD, carotid bruits, or masses Cardiac: IRR; no murmurs, rubs, or gallops,no edema  Respiratory:  clear to auscultation bilaterally, normal work of breathing GI: soft, nontender, nondistended, + BS MS: no deformity or atrophy Skin: warm and dry, no rash Neuro:  Strength and sensation are intact Psych: normal   EKG:  EKG is ordered today. The ekg ordered today demonstrates  Atrial fib at 79.  Poor R wave progression    Recent Labs: 05/21/2014: ALT 20; BUN 26*; Creatinine, Ser 1.25*; Potassium 4.3; Sodium 138 07/30/2014: TSH 3.82    Lipid Panel    Component Value Date/Time   CHOL 164 05/21/2014 0847   TRIG 147.0 05/21/2014 0847   HDL 63.80 05/21/2014 0847   CHOLHDL 3 05/21/2014 0847   VLDL 29.4 05/21/2014 0847   LDLCALC 71 05/21/2014 0847   LDLDIRECT 142.0 12/16/2013 0857      Wt Readings from Last 3 Encounters:  11/23/14 159 lb 1.9 oz (72.176 kg)  09/29/14 153 lb 3.2 oz (69.491 kg)  07/30/14 157 lb (71.215 kg)      Other studies Reviewed: Additional studies/ records that were reviewed today include: . Review of the above records demonstrates:    ASSESSMENT AND PLAN:  1. Atrial fibrillation- her rate is well-controlled. Continue current medications.   She's still concerned about losing her hair while on metoprolol. This apparently is a side effect. She has a history of chronic systolic congestive heart failure and I do not think that we should change from metoprolol to another medication. Specifically we should avoid diltiazem.  2.  Hypertension- BP is well controlled.   3. Benign Positional Vertigo 4. Chronic systolic CHF - - continue current medications. Dong well.     Current medicines are reviewed at length with the patient today.  The patient does not have concerns regarding medicines.  The following changes have been made:  no change  Labs/ tests ordered today include:  No orders of the defined types were placed in this encounter.     Disposition:   FU with me in 6 months  Nahser, Deloris Ping, MD  11/23/2014 8:33 AM    [facility] [address] [place], [place], [place]  [address] Phone: [phone]; Fax: [phone]   [facility]  [address] [place], [place]  [address] [phone]    Fax [phone]

## 2014-11-26 ENCOUNTER — Ambulatory Visit (INDEPENDENT_AMBULATORY_CARE_PROVIDER_SITE_OTHER): Payer: Commercial Managed Care - HMO | Admitting: Family Medicine

## 2014-11-26 ENCOUNTER — Encounter: Payer: Self-pay | Admitting: Family Medicine

## 2014-11-26 VITALS — BP 130/80 | HR 90 | Temp 98.4°F | Resp 14 | Ht 65.5 in | Wt 158.1 lb

## 2014-11-26 DIAGNOSIS — R531 Weakness: Secondary | ICD-10-CM | POA: Diagnosis not present

## 2014-11-26 DIAGNOSIS — Z9181 History of falling: Secondary | ICD-10-CM | POA: Diagnosis not present

## 2014-11-26 LAB — BASIC METABOLIC PANEL
BUN: 20 mg/dL (ref 6–23)
CO2: 28 mEq/L (ref 19–32)
CREATININE: 1.23 mg/dL — AB (ref 0.40–1.20)
Calcium: 9.2 mg/dL (ref 8.4–10.5)
Chloride: 104 mEq/L (ref 96–112)
GFR: 43.99 mL/min — AB (ref >60.00)
GLUCOSE: 90 mg/dL (ref 70–99)
POTASSIUM: 4.4 meq/L (ref 3.5–5.1)
Sodium: 140 mEq/L (ref 135–145)

## 2014-11-26 NOTE — Progress Notes (Signed)
Pre visit review using our clinic review tool, if applicable. No additional management support is needed unless otherwise documented below in the visit note. 

## 2014-11-26 NOTE — Patient Instructions (Signed)

## 2014-11-26 NOTE — Progress Notes (Signed)
Subjective:    Patient ID: [name], female    DOB: [birth year], 81 y.o.   MRN: [id number]  HPI Patient has chronic problems including history of atrial fibrillation on chronic Coumadin, hyperlipidemia, hypertension, idiopathic peripheral neuropathy, systolic heart failure. She states she's had some generalized weakness recently. Last week she was walking one day and stumbled backwards and fell O [place]. She's had some coccyx pain since then. Still ambulating significantly during the day. She's not had any other falls. No syncope history. She. Somewhat lightheaded but also apparently has some occasional vertigo symptoms. Mostly lightheadedness.  Medications reviewed. She is compliant with all. Regarding her general weakness she started physical therapy couple weeks ago. Denies any headache. No recent head injury. No confusion.  Past Medical History  Diagnosis Date  . B12 DEFICIENCY 11/25/2009  . ESSENTIAL HYPERTENSION 09/30/2009  . CHF 01/27/2009  . SMALL BOWEL OBSTRUCTION 05/05/2008  . DIVERTICULITIS OF COLON 01/27/2009  . OSTEOARTHRITIS, KNEE, RIGHT 02/08/2009  . OSTEOPENIA 01/27/2009  . Left ventricular dysfunction   . Hypertension   . Chronic systolic heart failure (HCC) 07/25/2009    a. Myoview 5/12:  Low risk, no ischemia, apical lateral defect probably breast attenuation, inf HK, EF 45%.  b. Echo 8/12: EF 45-50%, inf/inf-septal and apical HK, mild LVH, mild to mod MR, mild to mod TR, PASP 50;  c.  Echo 3/14:  Mild LVH, EF 20%, diff HK, mild to mod MR, mild LAE, mild to mod reduced RVSF, mild RAE, mild to mod TR, PASP 41  . Hemorrhoids   . Rectal bleeding   . Atrial fib/flutter, transient    Past Surgical History  Procedure Laterality Date  . Colon surgery  2006    Sigmoid colectomy for diverticulitis  . Abdominal hysterectomy  1996    TAHBSO  . Appendectomy  1951  . Tonsillectomy  1966  . Cardiac catheterization  April 14, 2012    nonobstructive CAD; severe LV dysfunction.      reports that she has never smoked. She has never used smokeless tobacco. She reports that she does not drink alcohol or use illicit drugs. family history includes Emphysema in an other family member; Heart disease in her father, sister, and another family member. Allergies  Allergen Reactions  . Bactrim   . Codeine   . Contrast Media [Iodinated Diagnostic Agents]   . Nitrofurantoin   . Nitrofurantoin Monohyd Macro   . Penicillins   . Sulfamethoxazole-Trimethoprim       Review of Systems  Constitutional: Negative for fatigue.  Eyes: Negative for visual disturbance.  Respiratory: Negative for cough, chest tightness, shortness of breath and wheezing.   Cardiovascular: Negative for chest pain, palpitations and leg swelling.  Gastrointestinal: Negative for abdominal pain.  Endocrine: Negative for polydipsia and polyuria.  Genitourinary: Negative for dysuria.  Neurological: Positive for light-headedness. Negative for seizures, syncope, weakness and headaches.  Psychiatric/Behavioral: Negative for confusion.       Objective:   Physical Exam  Constitutional: She is oriented to person, place, and time. She appears well-developed and well-nourished.  HENT:  Head: Normocephalic and atraumatic.  Neck: Neck supple. No thyromegaly present.  Cardiovascular: Normal rate.   irregular rhythm  Pulmonary/Chest: Effort normal and breath sounds normal. No respiratory distress. She has no wheezes. She has no rales.  Musculoskeletal: She exhibits no edema.  Neurological: She is alert and oriented to person, place, and time. No cranial nerve deficit.  No focal strength deficits but generally weak throughout. She is able  to get up from chair to standing without assistance but more effort than usual. She is ambulating without difficulty.  Psychiatric: She has a normal mood and affect. Her behavior is normal.          Assessment & Plan:  Recent fall with sacral/coccyx contusion. No hip pain  or other concerns. High risk for falls. Continue physical therapy. We have strongly advised that she use a cane for at all times but she refuses. She does have some peripheral neuropathy but her Romberg test is normal and we'll try to avoid drugs such as gabapentin or Lyrica which could further increase her falls right now  General weakness. Check basic metabolic panel with patient on furosemide. She denies any chest pain or other worrisome symptoms.

## 2014-11-29 DIAGNOSIS — M25572 Pain in left ankle and joints of left foot: Secondary | ICD-10-CM | POA: Diagnosis not present

## 2014-12-06 DIAGNOSIS — M25572 Pain in left ankle and joints of left foot: Secondary | ICD-10-CM | POA: Diagnosis not present

## 2014-12-10 DIAGNOSIS — M25572 Pain in left ankle and joints of left foot: Secondary | ICD-10-CM | POA: Diagnosis not present

## 2014-12-13 DIAGNOSIS — M25572 Pain in left ankle and joints of left foot: Secondary | ICD-10-CM | POA: Diagnosis not present

## 2014-12-16 ENCOUNTER — Ambulatory Visit (INDEPENDENT_AMBULATORY_CARE_PROVIDER_SITE_OTHER): Payer: Commercial Managed Care - HMO | Admitting: General Practice

## 2014-12-16 DIAGNOSIS — I4891 Unspecified atrial fibrillation: Secondary | ICD-10-CM | POA: Diagnosis not present

## 2014-12-16 DIAGNOSIS — Z5181 Encounter for therapeutic drug level monitoring: Secondary | ICD-10-CM | POA: Diagnosis not present

## 2014-12-16 DIAGNOSIS — Z7901 Long term (current) use of anticoagulants: Secondary | ICD-10-CM | POA: Diagnosis not present

## 2014-12-16 LAB — POCT INR: INR: 3

## 2014-12-16 NOTE — Progress Notes (Signed)
Pre visit review using our clinic review tool, if applicable. No additional management support is needed unless otherwise documented below in the visit note. 

## 2014-12-17 DIAGNOSIS — M25572 Pain in left ankle and joints of left foot: Secondary | ICD-10-CM | POA: Diagnosis not present

## 2014-12-30 ENCOUNTER — Telehealth: Payer: Self-pay | Admitting: Family Medicine

## 2014-12-30 DIAGNOSIS — Z0100 Encounter for examination of eyes and vision without abnormal findings: Secondary | ICD-10-CM

## 2014-12-30 NOTE — Telephone Encounter (Signed)
Order entered

## 2014-12-30 NOTE — Telephone Encounter (Signed)
[name] [facility] called saying the pt has an eye appointment on 12.28.16. They need a referral sent to their office. I'll send the paperwork to Gavin PoundDeborah the referral coordinator.  Appointment: 12.28.16 @ 8:00am Alma Downslbert Lundquist PA NPI: [id number] Diagnosis: 104month dilation OCT  [id number]  Office ph# [phone] / Fax# [phone] Thank you.

## 2015-01-05 DIAGNOSIS — Z961 Presence of intraocular lens: Secondary | ICD-10-CM | POA: Diagnosis not present

## 2015-01-05 DIAGNOSIS — H04123 Dry eye syndrome of bilateral lacrimal glands: Secondary | ICD-10-CM | POA: Diagnosis not present

## 2015-01-05 DIAGNOSIS — H353130 Nonexudative age-related macular degeneration, bilateral, stage unspecified: Secondary | ICD-10-CM | POA: Diagnosis not present

## 2015-01-13 ENCOUNTER — Ambulatory Visit (INDEPENDENT_AMBULATORY_CARE_PROVIDER_SITE_OTHER): Payer: Commercial Managed Care - HMO | Admitting: General Practice

## 2015-01-13 DIAGNOSIS — Z7901 Long term (current) use of anticoagulants: Secondary | ICD-10-CM | POA: Diagnosis not present

## 2015-01-13 DIAGNOSIS — Z5181 Encounter for therapeutic drug level monitoring: Secondary | ICD-10-CM | POA: Diagnosis not present

## 2015-01-13 DIAGNOSIS — I4891 Unspecified atrial fibrillation: Secondary | ICD-10-CM

## 2015-01-13 LAB — POCT INR: INR: 2.6

## 2015-01-13 NOTE — Progress Notes (Signed)
Pre visit review using our clinic review tool, if applicable. No additional management support is needed unless otherwise documented below in the visit note. 

## 2015-02-08 ENCOUNTER — Other Ambulatory Visit: Payer: Self-pay | Admitting: Family Medicine

## 2015-02-14 ENCOUNTER — Ambulatory Visit (INDEPENDENT_AMBULATORY_CARE_PROVIDER_SITE_OTHER): Payer: Commercial Managed Care - HMO | Admitting: General Practice

## 2015-02-14 DIAGNOSIS — Z5181 Encounter for therapeutic drug level monitoring: Secondary | ICD-10-CM | POA: Diagnosis not present

## 2015-02-14 DIAGNOSIS — I4891 Unspecified atrial fibrillation: Secondary | ICD-10-CM

## 2015-02-14 DIAGNOSIS — Z7901 Long term (current) use of anticoagulants: Secondary | ICD-10-CM | POA: Diagnosis not present

## 2015-02-14 LAB — POCT INR: INR: 2.3

## 2015-02-14 NOTE — Progress Notes (Signed)
Pre visit review using our clinic review tool, if applicable. No additional management support is needed unless otherwise documented below in the visit note. 

## 2015-02-25 ENCOUNTER — Ambulatory Visit (INDEPENDENT_AMBULATORY_CARE_PROVIDER_SITE_OTHER): Payer: Commercial Managed Care - HMO | Admitting: Family Medicine

## 2015-02-25 VITALS — BP 100/74 | HR 92 | Temp 98.4°F | Ht 65.5 in | Wt 158.7 lb

## 2015-02-25 DIAGNOSIS — R600 Localized edema: Secondary | ICD-10-CM

## 2015-02-25 DIAGNOSIS — I4891 Unspecified atrial fibrillation: Secondary | ICD-10-CM

## 2015-02-25 DIAGNOSIS — I10 Essential (primary) hypertension: Secondary | ICD-10-CM | POA: Diagnosis not present

## 2015-02-25 NOTE — Progress Notes (Signed)
Pre visit review using our clinic review tool, if applicable. No additional management support is needed unless otherwise documented below in the visit note. 

## 2015-02-25 NOTE — Progress Notes (Signed)
Subjective:    Patient ID: [name], female    DOB: 1926-03-02, 82 y.o.   MRN: [id number]  HPI Patient seen for medical follow-up She has history of atrial fibrillation, B12 deficiency, chronic systolic heart failure, hypertension, GERD, hyperlipidemia, osteoarthritis, polyneuropathy Still stays quite active. She goes to the gym a few times per week. Walks several miles per day Denies recent fall.  Increased bilateral leg edema past couple of weeks. Her weight is actually stable and unchanged from last visit. No increased dyspnea with exertion or orthopnea. Currently takes furosemide 20 mg twice a day.  Remains on Coumadin as well as metoprolol, losartan, Aldactone, and Crestor.  Past Medical History  Diagnosis Date  . B12 DEFICIENCY 11/25/2009  . ESSENTIAL HYPERTENSION 09/30/2009  . CHF 01/27/2009  . SMALL BOWEL OBSTRUCTION 05/05/2008  . DIVERTICULITIS OF COLON 01/27/2009  . OSTEOARTHRITIS, KNEE, RIGHT 02/08/2009  . OSTEOPENIA 01/27/2009  . Left ventricular dysfunction   . Hypertension   . Chronic systolic heart failure (HCC) 07/25/2009    a. Myoview 5/12:  Low risk, no ischemia, apical lateral defect probably breast attenuation, inf HK, EF 45%.  b. Echo 8/12: EF 45-50%, inf/inf-septal and apical HK, mild LVH, mild to mod MR, mild to mod TR, PASP 50;  c.  Echo 3/14:  Mild LVH, EF 20%, diff HK, mild to mod MR, mild LAE, mild to mod reduced RVSF, mild RAE, mild to mod TR, PASP 41  . Hemorrhoids   . Rectal bleeding   . Atrial fib/flutter, transient    Past Surgical History  Procedure Laterality Date  . Colon surgery  2006    Sigmoid colectomy for diverticulitis  . Abdominal hysterectomy  1996    TAHBSO  . Appendectomy  1951  . Tonsillectomy  1966  . Cardiac catheterization  April 14, 2012    nonobstructive CAD; severe LV dysfunction.     reports that she has never smoked. She has never used smokeless tobacco. She reports that she does not drink alcohol or use illicit  drugs. family history includes Heart disease in her father and sister. Allergies  Allergen Reactions  . Bactrim   . Codeine   . Contrast Media [Iodinated Diagnostic Agents]   . Nitrofurantoin   . Nitrofurantoin Monohyd Macro   . Penicillins   . Sulfamethoxazole-Trimethoprim         Review of Systems  Constitutional: Negative for appetite change, fatigue and unexpected weight change.  Eyes: Negative for visual disturbance.  Respiratory: Negative for cough, chest tightness, shortness of breath and wheezing.   Cardiovascular: Positive for leg swelling. Negative for chest pain and palpitations.  Genitourinary: Negative for dysuria.  Musculoskeletal: Positive for arthralgias.  Neurological: Negative for dizziness, seizures, syncope, weakness, light-headedness and headaches.  Psychiatric/Behavioral: Negative for confusion.       Objective:   Physical Exam  Constitutional: She appears well-developed and well-nourished.  Neck: Neck supple.  Cardiovascular: Normal rate.   Irregular rhythm but rate controlled  Pulmonary/Chest: Effort normal and breath sounds normal. No respiratory distress. She has no wheezes. She has no rales.  Musculoskeletal: She exhibits edema.  She has pitting edema which is slightly worse left leg than right leg.  Neurological: She is alert.          Assessment & Plan:  #1 bilateral leg edema. Slightly asymmetric left greater than right. History of known systolic heart failure. Weight stable and symptomatically stable. Increase furosemide to 40 mg the morning and 20 mg afternoon. Reassess one week  and check basic metabolic panel then  #2 chronic atrial fibrillation. Rate controlled. Continue metoprolol and Coumadin. Recent INR therapeutic  #3 hypertension. Stable. No recent orthostatic symptoms.

## 2015-02-25 NOTE — Patient Instructions (Signed)
INCREASE THE FUROSEMIDE TO TWO IN THE MORNING AND ONE IN THE AFTERNOON. ELEVATE LEGS FREQUENTLY. LET'S PLAN FOLLOW UP IN ONE WEEK.

## 2015-03-04 ENCOUNTER — Ambulatory Visit (INDEPENDENT_AMBULATORY_CARE_PROVIDER_SITE_OTHER): Payer: Commercial Managed Care - HMO | Admitting: Family Medicine

## 2015-03-04 VITALS — BP 110/80 | HR 88 | Temp 98.1°F | Ht 65.5 in | Wt 158.8 lb

## 2015-03-04 DIAGNOSIS — R600 Localized edema: Secondary | ICD-10-CM | POA: Diagnosis not present

## 2015-03-04 LAB — BASIC METABOLIC PANEL
BUN: 18 mg/dL (ref 6–23)
CALCIUM: 9.4 mg/dL (ref 8.4–10.5)
CO2: 31 mEq/L (ref 19–32)
Chloride: 102 mEq/L (ref 96–112)
Creatinine, Ser: 1.14 mg/dL (ref 0.40–1.20)
GFR: 47.99 mL/min — AB (ref >60.00)
GLUCOSE: 110 mg/dL — AB (ref 70–99)
POTASSIUM: 4.2 meq/L (ref 3.5–5.1)
SODIUM: 140 meq/L (ref 135–145)

## 2015-03-04 NOTE — Progress Notes (Signed)
Pre visit review using our clinic review tool, if applicable. No additional management support is needed unless otherwise documented below in the visit note. 

## 2015-03-04 NOTE — Patient Instructions (Signed)
Increase your Furosemide to two in the morning and two around 2 pm. Continue to elevate legs frequently.

## 2015-03-04 NOTE — Progress Notes (Signed)
Subjective:    Patient ID: [name], female    DOB: [birth year], 82 y.o.   MRN: [id number]  HPI Seen recently with increased leg edema left greater than right She has some chronic increased edema of the left over the right. She has not had any walking intolerance or exertional dyspnea. We increased her furosemide to 40 in the morning and 20 mg in afternoon. Her weight is exactly the same today and she has not noticed much change in her edema. No recent dietary changes. Still exercising regularly She does have history of systolic failure.  Her upper lip is swollen today and she states she had dental procedure yesterday which involved local anesthesia and gum procedure and states her swelling started after that  Past Medical History  Diagnosis Date  . B12 DEFICIENCY 11/25/2009  . ESSENTIAL HYPERTENSION 09/30/2009  . CHF 01/27/2009  . SMALL BOWEL OBSTRUCTION 05/05/2008  . DIVERTICULITIS OF COLON 01/27/2009  . OSTEOARTHRITIS, KNEE, RIGHT 02/08/2009  . OSTEOPENIA 01/27/2009  . Left ventricular dysfunction   . Hypertension   . Chronic systolic heart failure (HCC) 07/25/2009    a. Myoview 5/12:  Low risk, no ischemia, apical lateral defect probably breast attenuation, inf HK, EF 45%.  b. Echo 8/12: EF 45-50%, inf/inf-septal and apical HK, mild LVH, mild to mod MR, mild to mod TR, PASP 50;  c.  Echo 3/14:  Mild LVH, EF 20%, diff HK, mild to mod MR, mild LAE, mild to mod reduced RVSF, mild RAE, mild to mod TR, PASP 41  . Hemorrhoids   . Rectal bleeding   . Atrial fib/flutter, transient    Past Surgical History  Procedure Laterality Date  . Colon surgery  2006    Sigmoid colectomy for diverticulitis  . Abdominal hysterectomy  1996    TAHBSO  . Appendectomy  1951  . Tonsillectomy  1966  . Cardiac catheterization  April 14, 2012    nonobstructive CAD; severe LV dysfunction.     reports that she has never smoked. She has never used smokeless tobacco. She reports that she does not drink  alcohol or use illicit drugs. family history includes Heart disease in her father and sister. Allergies  Allergen Reactions  . Bactrim   . Codeine   . Contrast Media [Iodinated Diagnostic Agents]   . Nitrofurantoin   . Nitrofurantoin Monohyd Macro   . Penicillins   . Sulfamethoxazole-Trimethoprim       Review of Systems  Constitutional: Negative for fatigue and unexpected weight change.  Eyes: Negative for visual disturbance.  Respiratory: Negative for cough, chest tightness, shortness of breath and wheezing.   Cardiovascular: Positive for leg swelling. Negative for chest pain and palpitations.  Neurological: Negative for dizziness, seizures, syncope, weakness, light-headedness and headaches.       Objective:   Physical Exam  Constitutional: She appears well-developed and well-nourished.  Cardiovascular: Normal rate.  Exam reveals no gallop.   Pulmonary/Chest: Effort normal and breath sounds normal. No respiratory distress. She has no wheezes. She has no rales.  Musculoskeletal: She exhibits edema.  Trace pitting edema right leg and 1+ left leg-essentially unchanged from one week ago          Assessment & Plan:  Bilateral leg edema. Increased furosemide to 40 mg the morning and 40 mg in afternoon for one week. If edema improved at that point she will drop back to 40 mg morning and 20 mg afternoon until we follow-up in 4 weeks to reassess. Check basic metabolic  panel today.

## 2015-03-15 ENCOUNTER — Telehealth: Payer: Self-pay | Admitting: Family Medicine

## 2015-03-15 NOTE — Telephone Encounter (Signed)
FYI

## 2015-03-15 NOTE — Telephone Encounter (Signed)
Pt would like to let md know her dentist put her on valtrex for mouth sores. Pt just want clarification that the medication is ok.

## 2015-03-28 ENCOUNTER — Ambulatory Visit (INDEPENDENT_AMBULATORY_CARE_PROVIDER_SITE_OTHER): Payer: Commercial Managed Care - HMO | Admitting: General Practice

## 2015-03-28 ENCOUNTER — Telehealth: Payer: Self-pay | Admitting: Family Medicine

## 2015-03-28 DIAGNOSIS — Z5181 Encounter for therapeutic drug level monitoring: Secondary | ICD-10-CM | POA: Diagnosis not present

## 2015-03-28 DIAGNOSIS — Z7901 Long term (current) use of anticoagulants: Secondary | ICD-10-CM

## 2015-03-28 DIAGNOSIS — I4891 Unspecified atrial fibrillation: Secondary | ICD-10-CM

## 2015-03-28 LAB — POCT INR: INR: 3

## 2015-03-28 NOTE — Progress Notes (Signed)
Pre visit review using our clinic review tool, if applicable. No additional management support is needed unless otherwise documented below in the visit note. 

## 2015-03-28 NOTE — Telephone Encounter (Signed)
Disregard

## 2015-03-31 ENCOUNTER — Ambulatory Visit (INDEPENDENT_AMBULATORY_CARE_PROVIDER_SITE_OTHER): Payer: Commercial Managed Care - HMO | Admitting: Family Medicine

## 2015-03-31 VITALS — BP 128/80 | HR 80 | Temp 97.8°F | Ht 65.5 in | Wt 155.6 lb

## 2015-03-31 DIAGNOSIS — I10 Essential (primary) hypertension: Secondary | ICD-10-CM | POA: Diagnosis not present

## 2015-03-31 DIAGNOSIS — E785 Hyperlipidemia, unspecified: Secondary | ICD-10-CM | POA: Diagnosis not present

## 2015-03-31 DIAGNOSIS — I5022 Chronic systolic (congestive) heart failure: Secondary | ICD-10-CM

## 2015-03-31 LAB — BASIC METABOLIC PANEL
BUN: 29 mg/dL — ABNORMAL HIGH (ref 6–23)
CALCIUM: 9.3 mg/dL (ref 8.4–10.5)
CHLORIDE: 101 meq/L (ref 96–112)
CO2: 31 mEq/L (ref 19–32)
CREATININE: 1.31 mg/dL — AB (ref 0.40–1.20)
GFR: 40.87 mL/min — ABNORMAL LOW (ref >60.00)
Glucose, Bld: 101 mg/dL — ABNORMAL HIGH (ref 70–99)
Potassium: 4 mEq/L (ref 3.5–5.1)
SODIUM: 141 meq/L (ref 135–145)

## 2015-03-31 LAB — HEPATIC FUNCTION PANEL
ALBUMIN: 4.1 g/dL (ref 3.5–5.2)
ALK PHOS: 85 U/L (ref 39–117)
ALT: 26 U/L (ref 0–35)
AST: 30 U/L (ref 0–37)
BILIRUBIN DIRECT: 0.3 mg/dL (ref 0.0–0.3)
TOTAL PROTEIN: 6.8 g/dL (ref 6.0–8.3)
Total Bilirubin: 1.1 mg/dL (ref 0.2–1.2)

## 2015-03-31 LAB — LIPID PANEL
CHOLESTEROL: 157 mg/dL (ref 0–200)
HDL: 57.9 mg/dL (ref >39.00)
LDL CALC: 71 mg/dL (ref 0–99)
NonHDL: 99.12
TRIGLYCERIDES: 142 mg/dL (ref 0.0–149.0)
Total CHOL/HDL Ratio: 3
VLDL: 28.4 mg/dL (ref 0.0–40.0)

## 2015-03-31 MED ORDER — FUROSEMIDE 40 MG PO TABS: 40.0000 mg | ORAL_TABLET | Freq: Two times a day (BID) | ORAL | Status: DC

## 2015-03-31 NOTE — Progress Notes (Signed)
   Subjective:    Patient ID: [name], female    DOB: 08/21/1926, 82 y.o.   MRN: [id number]  HPI Here for medical follow-up  History of systolic heart failure. Weight is down about 3 pounds from last visit. Currently on furosemide 40 mg twice daily. She still has some peripheral edema left leg greater than right which is been relatively chronic. She has been intolerant of compression hose. Compliant with therapy. No recent orthostasis. Recent electrolytes in February were stable  Chronic atrial fibrillation on Coumadin. Follow the Coumadin clinic. No recent bleeding complications  She has history of hyperlipidemia treated with Crestor. Due for follow-up lipids.  Past Medical History  Diagnosis Date  . B12 DEFICIENCY 11/25/2009  . ESSENTIAL HYPERTENSION 09/30/2009  . CHF 01/27/2009  . SMALL BOWEL OBSTRUCTION 05/05/2008  . DIVERTICULITIS OF COLON 01/27/2009  . OSTEOARTHRITIS, KNEE, RIGHT 02/08/2009  . OSTEOPENIA 01/27/2009  . Left ventricular dysfunction   . Hypertension   . Chronic systolic heart failure (HCC) 07/25/2009    a. Myoview 5/12:  Low risk, no ischemia, apical lateral defect probably breast attenuation, inf HK, EF 45%.  b. Echo 8/12: EF 45-50%, inf/inf-septal and apical HK, mild LVH, mild to mod MR, mild to mod TR, PASP 50;  c.  Echo 3/14:  Mild LVH, EF 20%, diff HK, mild to mod MR, mild LAE, mild to mod reduced RVSF, mild RAE, mild to mod TR, PASP 41  . Hemorrhoids   . Rectal bleeding   . Atrial fib/flutter, transient    Past Surgical History  Procedure Laterality Date  . Colon surgery  2006    Sigmoid colectomy for diverticulitis  . Abdominal hysterectomy  1996    TAHBSO  . Appendectomy  1951  . Tonsillectomy  1966  . Cardiac catheterization  April 14, 2012    nonobstructive CAD; severe LV dysfunction.     reports that she has never smoked. She has never used smokeless tobacco. She reports that she does not drink alcohol or use illicit drugs. family history  includes Heart disease in her father and sister. Allergies  Allergen Reactions  . Bactrim   . Codeine   . Contrast Media [Iodinated Diagnostic Agents]   . Nitrofurantoin   . Nitrofurantoin Monohyd Macro   . Penicillins   . Sulfamethoxazole-Trimethoprim       Review of Systems  Constitutional: Negative for fatigue and unexpected weight change.  Eyes: Negative for visual disturbance.  Respiratory: Negative for cough, chest tightness, shortness of breath and wheezing.   Cardiovascular: Positive for leg swelling. Negative for chest pain and palpitations.  Genitourinary: Negative for dysuria.  Neurological: Negative for dizziness, seizures, syncope, weakness, light-headedness and headaches.       Objective:   Physical Exam  Constitutional: She appears well-developed and well-nourished.  Neck: Neck supple. No JVD present.  Cardiovascular: Normal rate.   Irregularly irregular rhythm  Pulmonary/Chest: Effort normal and breath sounds normal. No respiratory distress. She has no wheezes. She has no rales.  Musculoskeletal: She exhibits edema.          Assessment & Plan:  #1 hypertension. Stable and at goal  #2 history of systolic heart failure. Her weight is stable. She still has some peripheral edema. Currently on furosemide 40 mg twice a day. Refill medication. Recheck basic metabolic panel with recent increase of furosemide  #3 hyperlipidemia. Recheck fasting lipid and hepatic panel  #4 chronic atrial fibrillation. Continue Coumadin. Continue follow-up Coumadin clinic

## 2015-03-31 NOTE — Addendum Note (Signed)
Addended by: Tempie HoistMCNEIL, AUTUMN M on: 03/31/2015 05:09 PM   Modules accepted: Medications

## 2015-03-31 NOTE — Progress Notes (Signed)
Pre visit review using our clinic review tool, if applicable. No additional management support is needed unless otherwise documented below in the visit note. 

## 2015-04-01 ENCOUNTER — Ambulatory Visit: Payer: Commercial Managed Care - HMO | Admitting: Family Medicine

## 2015-04-04 ENCOUNTER — Telehealth: Payer: Self-pay | Admitting: Family Medicine

## 2015-04-04 NOTE — Telephone Encounter (Signed)
Patient states that she would like a call regarding on how to take her furosemide (LASIX) 40 MG tablet.

## 2015-04-04 NOTE — Telephone Encounter (Signed)
Pt is aware how to take medication.

## 2015-04-19 ENCOUNTER — Other Ambulatory Visit: Payer: Self-pay | Admitting: Cardiovascular Disease

## 2015-04-19 NOTE — Telephone Encounter (Signed)
spironolactone (ALDACTONE) 25 MG tablet  Medication   Date: 06/16/2014  [facility] [address] [facility]  Ordering/Authorizing: Vesta MixerPhilip J Nahser, MD      Order Providers    Prescribing Provider Encounter Provider   Vesta MixerPhilip J Nahser, MD Vesta MixerPhilip J Nahser, MD    Medication Detail      Disp Refills Start End     spironolactone (ALDACTONE) 25 MG tablet 45 tablet 3 06/16/2014     Sig: TAKE ONE-HALF TABLET BY MOUTH ONCE DAILY    E-Prescribing Status: Receipt confirmed by pharmacy (06/16/2014 11:33 AM EDT)     Pharmacy    [id number] - [place], [place] - [address]

## 2015-05-09 ENCOUNTER — Ambulatory Visit (INDEPENDENT_AMBULATORY_CARE_PROVIDER_SITE_OTHER): Payer: Commercial Managed Care - HMO | Admitting: General Practice

## 2015-05-09 DIAGNOSIS — Z7901 Long term (current) use of anticoagulants: Secondary | ICD-10-CM | POA: Diagnosis not present

## 2015-05-09 DIAGNOSIS — I4891 Unspecified atrial fibrillation: Secondary | ICD-10-CM | POA: Diagnosis not present

## 2015-05-09 DIAGNOSIS — Z5181 Encounter for therapeutic drug level monitoring: Secondary | ICD-10-CM | POA: Diagnosis not present

## 2015-05-09 LAB — POCT INR: INR: 2.3

## 2015-05-09 NOTE — Progress Notes (Signed)
Pre visit review using our clinic review tool, if applicable. No additional management support is needed unless otherwise documented below in the visit note. 

## 2015-06-20 ENCOUNTER — Ambulatory Visit (INDEPENDENT_AMBULATORY_CARE_PROVIDER_SITE_OTHER): Payer: Commercial Managed Care - HMO | Admitting: General Practice

## 2015-06-20 DIAGNOSIS — Z7901 Long term (current) use of anticoagulants: Secondary | ICD-10-CM

## 2015-06-20 DIAGNOSIS — Z5181 Encounter for therapeutic drug level monitoring: Secondary | ICD-10-CM | POA: Diagnosis not present

## 2015-06-20 DIAGNOSIS — I4891 Unspecified atrial fibrillation: Secondary | ICD-10-CM | POA: Diagnosis not present

## 2015-06-20 LAB — POCT INR: INR: 2.3

## 2015-06-20 NOTE — Progress Notes (Signed)
Pre visit review using our clinic review tool, if applicable. No additional management support is needed unless otherwise documented below in the visit note. 

## 2015-07-04 ENCOUNTER — Ambulatory Visit (INDEPENDENT_AMBULATORY_CARE_PROVIDER_SITE_OTHER): Payer: Commercial Managed Care - HMO | Admitting: Family Medicine

## 2015-07-04 VITALS — BP 120/74 | HR 88 | Temp 98.6°F | Ht 65.5 in | Wt 155.5 lb

## 2015-07-04 DIAGNOSIS — I5022 Chronic systolic (congestive) heart failure: Secondary | ICD-10-CM

## 2015-07-04 DIAGNOSIS — M722 Plantar fascial fibromatosis: Secondary | ICD-10-CM

## 2015-07-04 DIAGNOSIS — I4891 Unspecified atrial fibrillation: Secondary | ICD-10-CM | POA: Diagnosis not present

## 2015-07-04 NOTE — Progress Notes (Signed)
Pre visit review using our clinic review tool, if applicable. No additional management support is needed unless otherwise documented below in the visit note. 

## 2015-07-04 NOTE — Patient Instructions (Signed)

## 2015-07-04 NOTE — Progress Notes (Signed)
Subjective:    Patient ID: [name], female    DOB: 09/19/1926, 82 y.o.   MRN: [id number]  HPI Patient seen for medical follow-up  Atrial fibrillation on Coumadin. She is getting her INRs monitored regularly. No recent bleeding complications. No dyspnea.  Chronic bilateral leg edema. Worse late in the day. Currently on Lasix 40 mg the morning and 20 mg early afternoon. She has chronic kidney disease. Recent electrolytes stable  Ongoing issues with plantar fasciitis of left foot. She has some inserts. She stretches regularly. She's had steroid injections twice in the past without much improvement. Still walks a lot each day.  She has chronic systolic heart failure. No orthopnea. No difficulties with ambulation other than the fact she stops to rest after several feet of walking   Past Medical History  Diagnosis Date  . B12 DEFICIENCY 11/25/2009  . ESSENTIAL HYPERTENSION 09/30/2009  . CHF 01/27/2009  . SMALL BOWEL OBSTRUCTION 05/05/2008  . DIVERTICULITIS OF COLON 01/27/2009  . OSTEOARTHRITIS, KNEE, RIGHT 02/08/2009  . OSTEOPENIA 01/27/2009  . Left ventricular dysfunction   . Hypertension   . Chronic systolic heart failure (HCC) 07/25/2009    a. Myoview 5/12:  Low risk, no ischemia, apical lateral defect probably breast attenuation, inf HK, EF 45%.  b. Echo 8/12: EF 45-50%, inf/inf-septal and apical HK, mild LVH, mild to mod MR, mild to mod TR, PASP 50;  c.  Echo 3/14:  Mild LVH, EF 20%, diff HK, mild to mod MR, mild LAE, mild to mod reduced RVSF, mild RAE, mild to mod TR, PASP 41  . Hemorrhoids   . Rectal bleeding   . Atrial fib/flutter, transient    Past Surgical History  Procedure Laterality Date  . Colon surgery  2006    Sigmoid colectomy for diverticulitis  . Abdominal hysterectomy  1996    TAHBSO  . Appendectomy  1951  . Tonsillectomy  1966  . Cardiac catheterization  April 14, 2012    nonobstructive CAD; severe LV dysfunction.     reports that she has never smoked.  She has never used smokeless tobacco. She reports that she does not drink alcohol or use illicit drugs. family history includes Heart disease in her father and sister. Allergies  Allergen Reactions  . Bactrim   . Codeine   . Contrast Media [Iodinated Diagnostic Agents]   . Nitrofurantoin   . Nitrofurantoin Monohyd Macro   . Penicillins   . Sulfamethoxazole-Trimethoprim       Review of Systems  Constitutional: Negative for appetite change, fatigue and unexpected weight change.  Eyes: Negative for visual disturbance.  Respiratory: Negative for cough, chest tightness and wheezing. Shortness of breath: with exertion only.   Cardiovascular: Positive for leg swelling. Negative for chest pain and palpitations.  Genitourinary: Negative for dysuria.  Neurological: Negative for dizziness, seizures, syncope, weakness, light-headedness and headaches.       Objective:   Physical Exam  Constitutional: She is oriented to person, place, and time. She appears well-developed and well-nourished.  Eyes: Pupils are equal, round, and reactive to light.  Neck: Neck supple. No JVD present. No thyromegaly present.  Cardiovascular: Normal rate and regular rhythm.  Exam reveals no gallop.   Pulmonary/Chest: Effort normal and breath sounds normal. No respiratory distress. She has no wheezes. She has no rales.  Musculoskeletal: She exhibits edema.  He has some chronic edema left leg greater than right 1+ pitting  Neurological: She is alert and oriented to person, place, and time.  Assessment & Plan:  #1 chronic systolic heart failure. Weight is stable. Symptomatically stable Continue furosemide. Continue beta blocker. Continue Aldactone. Recheck basic metabolic panel at follow-up  #2 chronic left plantar fasciitis. We gave her name of couple podiatrists. May benefit from orthotics. We reviewed appropriate stretches  #3 atrial fibrillation. Continue Coumadin. Continue close monitoring to  Coumadin clinic.  Kristian CoveyBruce W Shuan Statzer MD [facility] .

## 2015-07-06 DIAGNOSIS — H04123 Dry eye syndrome of bilateral lacrimal glands: Secondary | ICD-10-CM | POA: Diagnosis not present

## 2015-07-06 DIAGNOSIS — H353132 Nonexudative age-related macular degeneration, bilateral, intermediate dry stage: Secondary | ICD-10-CM | POA: Diagnosis not present

## 2015-07-07 ENCOUNTER — Ambulatory Visit (INDEPENDENT_AMBULATORY_CARE_PROVIDER_SITE_OTHER): Payer: Commercial Managed Care - HMO | Admitting: Cardiovascular Disease

## 2015-07-07 ENCOUNTER — Encounter: Payer: Self-pay | Admitting: Cardiovascular Disease

## 2015-07-07 VITALS — BP 98/70 | HR 78 | Ht 65.0 in | Wt 155.8 lb

## 2015-07-07 DIAGNOSIS — I10 Essential (primary) hypertension: Secondary | ICD-10-CM

## 2015-07-07 DIAGNOSIS — I482 Chronic atrial fibrillation, unspecified: Secondary | ICD-10-CM

## 2015-07-07 NOTE — Progress Notes (Signed)
Cardiology Office Note   Date:  07/07/2015   ID:  [name], DOB Jan 28, 1926, MRN [id number]  PCP:  [name], MD  Cardiologist:   Kristeen Miss, MD   No chief complaint on file.  1. Atrial fibrillation 2. Hypertension 3. Benign Positional Vertigo 4. Chronic systolic CHF -  Left ventricle: The cavity size was mildly dilated. Wall thickness was increased in a pattern of mild LVH. The estimated ejection fraction was 20%. Diffuse hypokinesis. - Mitral valve: Mild to moderate regurgitation. - Left atrium: The atrium was mildly dilated. - Right ventricle: Systolic function was mildly to moderately reduced. - Right atrium: The atrium was mildly dilated. - Tricuspid valve: Mild-moderate regurgitation. - Pulmonary arteries: PA peak pressure: 41mm Hg  History of Present Illness:  [name] is a 82 y.o. female with a history of atrial fibrillation. She was cardioverted in October, 2012. She unfortunately has gone back into atrial fibrillation.  She has felt very well. She's not having any episodes of chest pain or shortness breath. She's tolerating the rhythm quite well. Her only complaint is that she's itching from the patches were placed during the cardioversion. She also complains of having very dry and cracking skin on her hands.   Sept. 16, 2013 She has been exercising regularly. She has gained weight since I last saw her ( 11 lbs.) She has no complaints of dyspnea or chest pain.  March 21, 2012:  She was seen by Lorin Picket several weeks ago. She was scheduled have an echocardiogram but she has not had that yet. ( was scheduled but we had a big snow storm). She's been having more shortness breath and more leg edema. Scar decreased her Lasix. Her leg swelling is better. He also has some chest heaviness. She is trying to limit her stair climbing.  April 04, 2012:  Her recent echo shows markedly decreased LV function   Left ventricle: The cavity size was mildly  dilated. Wall thickness was increased in a pattern of mild LVH. The estimated ejection fraction was 20%. Diffuse hypokinesis. - Mitral valve: Mild to moderate regurgitation. - Left atrium: The atrium was mildly dilated. - Right ventricle: Systolic function was mildly to moderately reduced. - Right atrium: The atrium was mildly dilated. - Tricuspid valve: Mild-moderate regurgitation. - Pulmonary arteries: PA peak pressure: 41mm Hg (S).  her myoivew study revealed an apical defect - ? Apical thinning. Low risk study.  She continues to have shortness breath. Not sure that she's having any chest pain. It's very difficult to get an accurate history of her. She seems to ramble in her conversation.  July 02, 2012:  Flow had a cardiac cath since of last year. She developed a rash after the cath She has nonobstructive coronary artery disease. Her ejection fraction is severely depressed with an EF of 20-25%. She continues to have atrial fibrillation with a well-controlled ventricular response. She is on chronic Coumadin therapy.  She goes to the [place] twice a week. She walks to [employer] ([place] about 1 mile) She stays very active. She complains of being short of breath but she remains very active.   Dec. 22, 2014:  [name] continues to have more dyspnea. She added an extra Lasix ( now 4 times a day). She does not think that she's breathing any better since increasing her Lasix from 3 times a day to 4 times a day. She still walks on a regular basis except very bad weather.   04/10/2013:  [name] has  done well. She had a brief episode of sharp CP - lasted 15 seconds. She has not been going to the gym. She is exercising in the AM to a CD. Able to workout without any CP or dyspnea.   Oct. 5, 2015:  [name] is seen back for follow up visit. Short of breath - about the same.  Sprained her ankle - had a cortisone injection Had a birthday recently.  May 24, 2014:  [name] is a 82 y.o. female who presents for her atrial fib  Nov. 15, 2016:  Complains that the metoprolol is thinning her hair.   Breathing is ok Goes to the gym,  Still gets out of breath  Has plantar fascitis.  July 07, 2015:  [name] is doing well.  Occasional blood in her stool  Still being bothered with plantar fascitis  BP is well controlled . INR is theraputic . Tolerating meds fairly well.   Past Medical History  Diagnosis Date  . B12 DEFICIENCY 11/25/2009  . ESSENTIAL HYPERTENSION 09/30/2009  . CHF 01/27/2009  . SMALL BOWEL OBSTRUCTION 05/05/2008  . DIVERTICULITIS OF COLON 01/27/2009  . OSTEOARTHRITIS, KNEE, RIGHT 02/08/2009  . OSTEOPENIA 01/27/2009  . Left ventricular dysfunction   . Hypertension   . Chronic systolic heart failure (HCC) 07/25/2009    a. Myoview 5/12:  Low risk, no ischemia, apical lateral defect probably breast attenuation, inf HK, EF 45%.  b. Echo 8/12: EF 45-50%, inf/inf-septal and apical HK, mild LVH, mild to mod MR, mild to mod TR, PASP 50;  c.  Echo 3/14:  Mild LVH, EF 20%, diff HK, mild to mod MR, mild LAE, mild to mod reduced RVSF, mild RAE, mild to mod TR, PASP 41  . Hemorrhoids   . Rectal bleeding   . Atrial fib/flutter, transient     Past Surgical History  Procedure Laterality Date  . Colon surgery  2006    Sigmoid colectomy for diverticulitis  . Abdominal hysterectomy  1996    TAHBSO  . Appendectomy  1951  . Tonsillectomy  1966  . Cardiac catheterization  April 14, 2012    nonobstructive CAD; severe LV dysfunction.      Current Outpatient Prescriptions  Medication Sig Dispense Refill  . furosemide (LASIX) 40 MG tablet Take 40 mg by mouth daily. Take 1 tablet by mouth in the AM and 0.5 tablet by mouth in the PM    . GuaiFENesin (MUCUS RELIEF ADULT PO) Take 1 tablet by mouth daily.     Marland Kitchen KLOR-CON M10 10 MEQ tablet TAKE TWO TABLETS BY MOUTH ONCE DAILY 60 tablet 11  . loratadine (CLARITIN) 10 MG tablet Take 10 mg by mouth daily.      .  metoprolol (LOPRESSOR) 100 MG tablet TAKE ONE TABLET BY MOUTH TWICE DAILY 60 tablet 11  . Multiple Vitamin (MULTIVITAMIN) tablet Take 1 tablet by mouth daily.      . Multiple Vitamins-Minerals (ICAPS PO) Take 2 tablets by mouth daily.     . rosuvastatin (CRESTOR) 10 MG tablet Take 1 tablet (10 mg total) by mouth daily. 90 tablet 3  . spironolactone (ALDACTONE) 25 MG tablet TAKE ONE-HALF TABLET BY MOUTH ONCE DAILY 45 tablet 1  . warfarin (COUMADIN) 5 MG tablet TAKE AS DIRECTED BY ANTICOAGULATION CLINIC. 30 tablet 3   No current facility-administered medications for this visit.    Allergies:   Bactrim; Codeine; Contrast media; Nitrofurantoin; Nitrofurantoin monohyd macro; Penicillins; and Sulfamethoxazole-trimethoprim    Social History:  The patient  reports that she has never smoked. She has never used smokeless tobacco. She reports that she does not drink alcohol or use illicit drugs.   Family History:  The patient's family history includes Heart disease in her father and sister.    ROS:  Please see the history of present illness.    Review of Systems: Constitutional:  denies fever, chills, diaphoresis, appetite change and fatigue.  HEENT: denies photophobia, eye pain, redness, hearing loss, ear pain, congestion, sore throat, rhinorrhea, sneezing, neck pain, neck stiffness and tinnitus.  Respiratory: denies SOB, DOE, cough, chest tightness, and wheezing.  Cardiovascular: denies chest pain, palpitations and leg swelling.  Gastrointestinal: denies nausea, vomiting, abdominal pain, diarrhea, constipation, blood in stool.  Genitourinary: denies dysuria, urgency, frequency, hematuria, flank pain and difficulty urinating.  Musculoskeletal: denies  myalgias, back pain, joint swelling, arthralgias and gait problem.   Skin: denies pallor, rash and wound.  Neurological: denies dizziness, seizures, syncope, weakness, light-headedness, numbness and headaches.   Hematological: denies adenopathy,  easy bruising, personal or family bleeding history.  Psychiatric/ Behavioral: denies suicidal ideation, mood changes, confusion, nervousness, sleep disturbance and agitation.       All other systems are reviewed and negative.    PHYSICAL EXAM: VS:  BP 98/70 mmHg  Pulse 78  Ht 5\' 5"  (1.651 m)  Wt 155 lb 12.8 oz (70.67 kg)  BMI 25.93 kg/m2 , BMI Body mass index is 25.93 kg/(m^2). GEN: Well nourished, well developed, in no acute distress HEENT: normal Neck: no JVD, carotid bruits, or masses Cardiac: IRR; no murmurs, rubs, or gallops,no edema  Respiratory:  clear to auscultation bilaterally, normal work of breathing GI: soft, nontender, nondistended, + BS MS: no deformity or atrophy Skin: warm and dry, no rash Neuro:  Strength and sensation are intact Psych: normal   EKG:  EKG is ordered today. The ekg ordered today demonstrates  Atrial fib at 78.  Poor R wave progression    Recent Labs: 07/30/2014: TSH 3.82 03/31/2015: ALT 26; BUN 29*; Creatinine, Ser 1.31*; Potassium 4.0; Sodium 141    Lipid Panel    Component Value Date/Time   CHOL 157 03/31/2015 0924   TRIG 142.0 03/31/2015 0924   HDL 57.90 03/31/2015 0924   CHOLHDL 3 03/31/2015 0924   VLDL 28.4 03/31/2015 0924   LDLCALC 71 03/31/2015 0924   LDLDIRECT 142.0 12/16/2013 0857      Wt Readings from Last 3 Encounters:  07/07/15 155 lb 12.8 oz (70.67 kg)  07/04/15 155 lb 8 oz (70.534 kg)  03/31/15 155 lb 9.6 oz (70.58 kg)      Other studies Reviewed: Additional studies/ records that were reviewed today include: . Review of the above records demonstrates:    ASSESSMENT AND PLAN:  1. Atrial fibrillation- her rate is well-controlled. Continue current medications.    Seems to be tolerating it well.   2. Hypertension- BP is well controlled.  Continue meds   3. Benign Positional Vertigo 4. Chronic systolic CHF - - continue current medications. Dong well.     Current medicines are reviewed at length with the  patient today.  The patient does not have concerns regarding medicines.  The following changes have been made:  no change  Labs/ tests ordered today include:  No orders of the defined types were placed in this encounter.     Disposition:   FU with me in 6 months      Kristeen MissPhilip Omarrion Carmer, MD  07/07/2015 8:12 AM    [facility] [address], [place], [place]  [address] Phone: [phone]) [phone]; Fax: [phone]) [phone]   [facility]  [address] [place], [place]  [address] [phone]) [phone]    Fax [phone]) [phone]

## 2015-07-07 NOTE — Patient Instructions (Signed)
Medication Instructions:  None  Labwork: Liver function and Lipid panel when you see Dr. Elease HashimotoNahser in 6 months.  These are fasting labs.  Testing/Procedures: None  Follow-Up: Your physician wants you to follow-up in: 6 months with Dr. Elease HashimotoNahser. You will receive a reminder letter in the mail two months in advance. If you don't receive a letter, please call our office to schedule the follow-up appointment.   Any Other Special Instructions Will Be Listed Below (If Applicable).     If you need a refill on your cardiac medications before your next appointment, please call your pharmacy.

## 2015-07-13 ENCOUNTER — Other Ambulatory Visit: Payer: Self-pay | Admitting: Family Medicine

## 2015-07-14 ENCOUNTER — Other Ambulatory Visit: Payer: Self-pay | Admitting: General Practice

## 2015-07-14 MED ORDER — WARFARIN SODIUM 5 MG PO TABS: ORAL_TABLET | ORAL | Status: DC

## 2015-07-25 LAB — POCT INR: INR: 2.9

## 2015-08-01 ENCOUNTER — Ambulatory Visit (INDEPENDENT_AMBULATORY_CARE_PROVIDER_SITE_OTHER): Payer: Commercial Managed Care - HMO | Admitting: General Practice

## 2015-08-01 DIAGNOSIS — I4891 Unspecified atrial fibrillation: Secondary | ICD-10-CM

## 2015-08-01 DIAGNOSIS — Z5181 Encounter for therapeutic drug level monitoring: Secondary | ICD-10-CM | POA: Diagnosis not present

## 2015-08-08 DIAGNOSIS — M659 Synovitis and tenosynovitis, unspecified: Secondary | ICD-10-CM | POA: Diagnosis not present

## 2015-08-08 DIAGNOSIS — M7732 Calcaneal spur, left foot: Secondary | ICD-10-CM | POA: Diagnosis not present

## 2015-08-08 DIAGNOSIS — M71572 Other bursitis, not elsewhere classified, left ankle and foot: Secondary | ICD-10-CM | POA: Diagnosis not present

## 2015-08-08 DIAGNOSIS — M722 Plantar fascial fibromatosis: Secondary | ICD-10-CM | POA: Diagnosis not present

## 2015-08-08 DIAGNOSIS — M76822 Posterior tibial tendinitis, left leg: Secondary | ICD-10-CM | POA: Diagnosis not present

## 2015-08-16 DIAGNOSIS — M71572 Other bursitis, not elsewhere classified, left ankle and foot: Secondary | ICD-10-CM | POA: Diagnosis not present

## 2015-08-16 DIAGNOSIS — M659 Synovitis and tenosynovitis, unspecified: Secondary | ICD-10-CM | POA: Diagnosis not present

## 2015-08-16 DIAGNOSIS — M722 Plantar fascial fibromatosis: Secondary | ICD-10-CM | POA: Diagnosis not present

## 2015-08-17 ENCOUNTER — Other Ambulatory Visit: Payer: Self-pay

## 2015-08-17 ENCOUNTER — Ambulatory Visit (INDEPENDENT_AMBULATORY_CARE_PROVIDER_SITE_OTHER): Payer: Commercial Managed Care - HMO | Admitting: Family Medicine

## 2015-08-17 VITALS — BP 130/90 | HR 98 | Temp 99.2°F | Ht 65.5 in | Wt 152.0 lb

## 2015-08-17 DIAGNOSIS — I5022 Chronic systolic (congestive) heart failure: Secondary | ICD-10-CM | POA: Diagnosis not present

## 2015-08-17 DIAGNOSIS — I10 Essential (primary) hypertension: Secondary | ICD-10-CM

## 2015-08-17 DIAGNOSIS — N183 Chronic kidney disease, stage 3 unspecified: Secondary | ICD-10-CM | POA: Insufficient documentation

## 2015-08-17 DIAGNOSIS — I4891 Unspecified atrial fibrillation: Secondary | ICD-10-CM

## 2015-08-17 DIAGNOSIS — Z0000 Encounter for general adult medical examination without abnormal findings: Secondary | ICD-10-CM | POA: Diagnosis not present

## 2015-08-17 LAB — BASIC METABOLIC PANEL
BUN: 31 mg/dL — ABNORMAL HIGH (ref 6–23)
CO2: 25 meq/L (ref 19–32)
Calcium: 10.2 mg/dL (ref 8.4–10.5)
Chloride: 103 mEq/L (ref 96–112)
Creatinine, Ser: 1.39 mg/dL — ABNORMAL HIGH (ref 0.40–1.20)
GFR: 38.13 mL/min — AB (ref >60.00)
GLUCOSE: 115 mg/dL — AB (ref 70–99)
POTASSIUM: 4.3 meq/L (ref 3.5–5.1)
SODIUM: 138 meq/L (ref 135–145)

## 2015-08-17 LAB — CBC WITH DIFFERENTIAL/PLATELET
Basophils Absolute: 0 10*3/uL (ref 0.0–0.1)
Basophils Relative: 0.1 % (ref 0.0–3.0)
EOS PCT: 0 % (ref 0.0–5.0)
Eosinophils Absolute: 0 10*3/uL (ref 0.0–0.7)
HCT: 41.4 % (ref 36.0–46.0)
Hemoglobin: 14 g/dL (ref 12.0–15.0)
LYMPHS ABS: 1.5 10*3/uL (ref 0.7–4.0)
Lymphocytes Relative: 15 % (ref 12.0–46.0)
MCHC: 33.9 g/dL (ref 30.0–36.0)
MCV: 94.2 fl (ref 78.0–100.0)
MONOS PCT: 7.3 % (ref 3.0–12.0)
Monocytes Absolute: 0.7 10*3/uL (ref 0.1–1.0)
NEUTROS ABS: 7.9 10*3/uL — AB (ref 1.4–7.7)
NEUTROS PCT: 77.6 % — AB (ref 43.0–77.0)
Platelets: 190 10*3/uL (ref 150.0–400.0)
RBC: 4.39 Mil/uL (ref 3.87–5.11)
RDW: 14.3 % (ref 11.5–15.5)
WBC: 10.2 10*3/uL (ref 4.0–10.5)

## 2015-08-17 NOTE — Patient Instructions (Signed)
Consider setting up mammogram  Health Maintenance  Topic Date Due  . ZOSTAVAX  10/10/1988  . TETANUS/TDAP  01/08/2013  . INFLUENZA VACCINE  08/09/2015  . DEXA SCAN  Completed  . PNA vac Low Risk Adult  Completed

## 2015-08-17 NOTE — Progress Notes (Signed)
Subjective:     Patient ID: [name], female   DOB: 08/08/1926, 82 y.o.   MRN: [id number]  HPI Patient schedule for Medicare wellness visit and medical follow-up  She has not had shingles vaccine but is not interested. She is also not had tetanus in 10 years. Her pneumonia vaccinations are up-to-date and she gets yearly flu vaccine. She's not had mammogram 2 years.  Chronic problems include hypertension, chronic systolic heart failure, atrial fibrillation, chronic kidney disease. Medications reviewed. Compliant with all. She is followed through the Coumadin clinic. No recent bleeding complications.  Stills walks frequently.  Dyspnea with hills and stairs but stable.  Patient states she has a living will and also son has medical power of attorney. No recent falls. Denies depression symptoms. Stays very active.  Past Medical History:  Diagnosis Date  . Atrial fib/flutter, transient   . B12 DEFICIENCY 11/25/2009  . CHF 01/27/2009  . Chronic systolic heart failure (HCC) 07/25/2009   a. Myoview 5/12:  Low risk, no ischemia, apical lateral defect probably breast attenuation, inf HK, EF 45%.  b. Echo 8/12: EF 45-50%, inf/inf-septal and apical HK, mild LVH, mild to mod MR, mild to mod TR, PASP 50;  c.  Echo 3/14:  Mild LVH, EF 20%, diff HK, mild to mod MR, mild LAE, mild to mod reduced RVSF, mild RAE, mild to mod TR, PASP 41  . DIVERTICULITIS OF COLON 01/27/2009  . ESSENTIAL HYPERTENSION 09/30/2009  . Hemorrhoids   . Hypertension   . Left ventricular dysfunction   . OSTEOARTHRITIS, KNEE, RIGHT 02/08/2009  . OSTEOPENIA 01/27/2009  . Rectal bleeding   . SMALL BOWEL OBSTRUCTION 05/05/2008   Past Surgical History:  Procedure Laterality Date  . ABDOMINAL HYSTERECTOMY  1996   TAHBSO  . APPENDECTOMY  1951  . CARDIAC CATHETERIZATION  April 14, 2012   nonobstructive CAD; severe LV dysfunction.   . COLON SURGERY  2006   Sigmoid colectomy for diverticulitis  . TONSILLECTOMY  1966    reports that  she has never smoked. She has never used smokeless tobacco. She reports that she does not drink alcohol or use drugs. family history includes Heart disease in her father and sister. Allergies  Allergen Reactions  . Bactrim Nausea Only and Other (See Comments)    headache  . Codeine Nausea Only  . Contrast Media [Iodinated Diagnostic Agents] Other (See Comments)    Pt unsure of reaction  . Nitrofurantoin Other (See Comments)    Pt unsure of reaction  . Nitrofurantoin Monohyd Macro Other (See Comments)    Pt unsure of reaction  . Penicillins Nausea And Vomiting  . Sulfamethoxazole-Trimethoprim Other (See Comments)    Pt unsure of reaction   1.  Risk factors based on Past Medical , Social, and Family history reviewed and as indicated above with no changes 2.  Limitations in physical activities None.  No recent falls. Still ambulates without assistance. 3.  Depression/mood No active depression or anxiety issues 4.  Hearing Mild hearing deficits bilaterally 5.  ADLs independent in all. 6.  Cognitive function (orientation to time and place, language, writing, speech,memory) no short or long term memory issues.  Language and judgement intact. 7.  Home Safety no issues 8.  Height, weight, and visual acuity.all stable. 9.  Counseling discussed fall prevention 10. Recommendation of preventive services. Discussed shingles vaccine and tetanus and she declines  Reminder for yearly flu vaccine 11. Labs based on risk factors lipid panel and basic metabolic panel 12. Care  Plan as above 13. Other Providers Dr Nahser-cardiology 14. Written schedule of screening/prevention services given to patient.     Review of Systems  Constitutional: Negative for chills, fatigue, fever and unexpected weight change.  Eyes: Negative for visual disturbance.  Respiratory: Positive for shortness of breath (with activity which is chronic and unchanged. No orthopnea). Negative for cough, chest tightness and wheezing.    Cardiovascular: Negative for chest pain, palpitations and leg swelling.  Gastrointestinal: Negative for abdominal pain.  Genitourinary: Negative for dysuria.  Neurological: Negative for dizziness, seizures, syncope, weakness, light-headedness and headaches.       Objective:   Physical Exam  Constitutional: She is oriented to person, place, and time. She appears well-developed and well-nourished.  HENT:  Right Ear: External ear normal.  Left Ear: External ear normal.  Mouth/Throat: Oropharynx is clear and moist.  Neck: Neck supple.  Cardiovascular:  Irregular irregular heart rhythm  Pulmonary/Chest: Effort normal and breath sounds normal. No respiratory distress. She has no wheezes. She has no rales.  Abdominal: Soft. Bowel sounds are normal. She exhibits no distension and no mass. There is no tenderness. There is no rebound and no guarding.  Musculoskeletal: She exhibits no edema.  Neurological: She is alert and oriented to person, place, and time. No cranial nerve deficit.  Psychiatric: She has a normal mood and affect. Her behavior is normal. Judgment and thought content normal.       Assessment:     #1 Medicare subsequent annual wellness visit  #2 atrial fibrillation-chronic on anti-coagulation and rate controlled.  #3 chronic systolic heart failure-stable  #4 chronic kidney disease stage III    Plan:     -Check labs with CBC and basic metabolic panel -Continue regular follow-up through the Coumadin clinic -She is encouraged to set up mammogram -Continue yearly flu vaccine -Discussed shingles vaccine and tetanus and she declines  [name] MD [facility] at [facility]

## 2015-08-17 NOTE — Progress Notes (Signed)
Pre visit review using our clinic review tool, if applicable. No additional management support is needed unless otherwise documented below in the visit note. 

## 2015-08-22 ENCOUNTER — Other Ambulatory Visit: Payer: Self-pay | Admitting: Family Medicine

## 2015-08-22 DIAGNOSIS — Z1231 Encounter for screening mammogram for malignant neoplasm of breast: Secondary | ICD-10-CM

## 2015-09-06 ENCOUNTER — Other Ambulatory Visit: Payer: Self-pay | Admitting: Cardiovascular Disease

## 2015-09-09 ENCOUNTER — Ambulatory Visit
Admission: RE | Admit: 2015-09-09 | Discharge: 2015-09-09 | Disposition: A | Discharge status: Home / Self Care | Payer: Commercial Managed Care - HMO | Source: Ambulatory Visit | Attending: Family Medicine | Admitting: Family Medicine

## 2015-09-09 DIAGNOSIS — Z1231 Encounter for screening mammogram for malignant neoplasm of breast: Secondary | ICD-10-CM | POA: Diagnosis not present

## 2015-09-13 DIAGNOSIS — T148 Other injury of unspecified body region: Secondary | ICD-10-CM | POA: Diagnosis not present

## 2015-09-16 ENCOUNTER — Encounter: Payer: Self-pay | Admitting: Family Medicine

## 2015-09-16 ENCOUNTER — Ambulatory Visit (INDEPENDENT_AMBULATORY_CARE_PROVIDER_SITE_OTHER): Payer: Commercial Managed Care - HMO | Admitting: Family Medicine

## 2015-09-16 VITALS — BP 110/84 | HR 86 | Temp 99.0°F | Ht 65.5 in | Wt 155.0 lb

## 2015-09-16 DIAGNOSIS — S40022A Contusion of left upper arm, initial encounter: Secondary | ICD-10-CM

## 2015-09-16 DIAGNOSIS — D649 Anemia, unspecified: Secondary | ICD-10-CM

## 2015-09-16 DIAGNOSIS — R42 Dizziness and giddiness: Secondary | ICD-10-CM

## 2015-09-16 DIAGNOSIS — S2020XA Contusion of thorax, unspecified, initial encounter: Secondary | ICD-10-CM | POA: Diagnosis not present

## 2015-09-16 DIAGNOSIS — S20219A Contusion of unspecified front wall of thorax, initial encounter: Secondary | ICD-10-CM

## 2015-09-16 DIAGNOSIS — S300XXA Contusion of lower back and pelvis, initial encounter: Secondary | ICD-10-CM

## 2015-09-16 LAB — CBC WITH DIFFERENTIAL/PLATELET
BASOS PCT: 0 %
Basophils Absolute: 0 cells/uL (ref 0–200)
Eosinophils Absolute: 284 cells/uL (ref 15–500)
Eosinophils Relative: 4 %
HCT: 30.5 % — ABNORMAL LOW (ref 35.0–45.0)
Hemoglobin: 10.2 g/dL — ABNORMAL LOW (ref 11.7–15.5)
LYMPHS PCT: 29 %
Lymphs Abs: 2059 cells/uL (ref 850–3900)
MCH: 32 pg (ref 27.0–33.0)
MCHC: 33.4 g/dL (ref 32.0–36.0)
MCV: 95.6 fL (ref 80.0–100.0)
MONOS PCT: 15 %
MPV: 9.9 fL (ref 7.5–12.5)
Monocytes Absolute: 1065 cells/uL — ABNORMAL HIGH (ref 200–950)
Neutro Abs: 3692 cells/uL (ref 1500–7800)
Neutrophils Relative %: 52 %
PLATELETS: 208 10*3/uL (ref 140–400)
RBC: 3.19 MIL/uL — AB (ref 3.80–5.10)
RDW: 14.8 % (ref 11.0–15.0)
WBC: 7.1 10*3/uL (ref 3.8–10.8)

## 2015-09-16 NOTE — Patient Instructions (Signed)
Stay well hydrated Change positions slowly We will call you with lab results.

## 2015-09-16 NOTE — Progress Notes (Signed)
Pre visit review using our clinic review tool, if applicable. No additional management support is needed unless otherwise documented below in the visit note. 

## 2015-09-16 NOTE — Progress Notes (Signed)
Subjective:     Patient ID: [name], female   DOB: 1926/06/19, 82 y.o.   MRN: [id number]  HPI Patient seen today, and by her son with reported fall last Saturday. She had a scab on her left ankle and she pulled off the scab and had significant bleeding She is on chronic Coumadin for atrial fibrillation. This was followed by some lightheadedness after she went downstairs. She apparently had unwitnessed syncopal episode downstairs and it was felt that she may have fallen against a door jam. No reported vomiting. No headaches. She noted bruising of the left proximal arm, anterior chest, and left lower back region. This again occurred last week on Saturday. She had spoken to her son that night  She has not had any cognitive changes. She does have some continued intermittent lightheadedness. No recurrent syncope. No focal weakness. She has some mild left low back pain. Patient also sustained laceration left brow region. Did not seek any medical care. Multiple chronic problems as below but she is generally very independent. She has been much less active during the past week. No hx of recurrent syncope.  Past Medical History:  Diagnosis Date  . Atrial fib/flutter, transient   . B12 DEFICIENCY 11/25/2009  . CHF 01/27/2009  . Chronic systolic heart failure (HCC) 07/25/2009   a. Myoview 5/12:  Low risk, no ischemia, apical lateral defect probably breast attenuation, inf HK, EF 45%.  b. Echo 8/12: EF 45-50%, inf/inf-septal and apical HK, mild LVH, mild to mod MR, mild to mod TR, PASP 50;  c.  Echo 3/14:  Mild LVH, EF 20%, diff HK, mild to mod MR, mild LAE, mild to mod reduced RVSF, mild RAE, mild to mod TR, PASP 41  . DIVERTICULITIS OF COLON 01/27/2009  . ESSENTIAL HYPERTENSION 09/30/2009  . Hemorrhoids   . Hypertension   . Left ventricular dysfunction   . OSTEOARTHRITIS, KNEE, RIGHT 02/08/2009  . OSTEOPENIA 01/27/2009  . Rectal bleeding   . SMALL BOWEL OBSTRUCTION 05/05/2008   Past Surgical  History:  Procedure Laterality Date  . ABDOMINAL HYSTERECTOMY  1996   TAHBSO  . APPENDECTOMY  1951  . CARDIAC CATHETERIZATION  April 14, 2012   nonobstructive CAD; severe LV dysfunction.   . COLON SURGERY  2006   Sigmoid colectomy for diverticulitis  . TONSILLECTOMY  1966    reports that she has never smoked. She has never used smokeless tobacco. She reports that she does not drink alcohol or use drugs. family history includes Heart disease in her father and sister. Allergies  Allergen Reactions  . Bactrim Nausea Only and Other (See Comments)    headache  . Codeine Nausea Only  . Contrast Media [Iodinated Diagnostic Agents] Other (See Comments)    Pt unsure of reaction  . Nitrofurantoin Other (See Comments)    Pt unsure of reaction  . Nitrofurantoin Monohyd Macro Other (See Comments)    Pt unsure of reaction  . Penicillins Nausea And Vomiting  . Sulfamethoxazole-Trimethoprim Other (See Comments)    Pt unsure of reaction     Review of Systems  Constitutional: Negative for chills and fever.  Respiratory: Negative for cough and shortness of breath.   Cardiovascular: Negative for chest pain and palpitations.  Gastrointestinal: Negative for abdominal pain, nausea and vomiting.  Genitourinary: Negative for dysuria.  Neurological: Positive for syncope (as per history of present illness) and light-headedness. Negative for speech difficulty and weakness.  Psychiatric/Behavioral: Negative for confusion.       Objective:  Physical Exam  Constitutional: She is oriented to person, place, and time. She appears well-developed and well-nourished.  HENT:  Head: Normocephalic.  Neck: Neck supple.  Cardiovascular: Normal rate and regular rhythm.   Pulmonary/Chest: Effort normal and breath sounds normal. No respiratory distress. She has no wheezes. She has no rales.  She has significant bruising over the superior sternal region with tenderness in that region  Abdominal: Soft. She  exhibits no distension. There is no tenderness.  Musculoskeletal: She exhibits no edema.  She has some ecchymosis involving the left proximal arm region  Lymphadenopathy:    She has no cervical adenopathy.  Neurological: She is alert and oriented to person, place, and time. No cranial nerve deficit.  No focal weakness  Skin:  Nail beds are slightly pale  Approximately 3 cm vertical laceration left brow region. Healing well no signs of secondary infection  Also has small bruise left lower back area laterally with no hematoma.       Assessment:     Recent syncopal episode following bleeding from her left lower leg in a patient on chronic Coumadin with multiple areas of ecchymosis following fall including left lower back, anterior sternal region, and left proximal arm. She also sustained laceration left facial region. She is ambulating without much difficulty. She's not had any headaches or confusion or other concerns    Plan:     -Check CBC to rule out significant anemia -Stay well-hydrated -We discussed getting possible x-rays of her left arm as well as sternal region but she was seen in our office after 4:30 PM and would become unable to get x-rays as an outpatient unless she went to the hospital. We elected to observe over the weekend. Try to avoid opioids pain medications to reduce risk of dizziness and further falls  Kristian CoveyBruce W Jakim Drapeau MD [facility]

## 2015-09-19 ENCOUNTER — Ambulatory Visit (INDEPENDENT_AMBULATORY_CARE_PROVIDER_SITE_OTHER): Payer: Commercial Managed Care - HMO | Admitting: General Practice

## 2015-09-19 DIAGNOSIS — Z5181 Encounter for therapeutic drug level monitoring: Secondary | ICD-10-CM | POA: Diagnosis not present

## 2015-09-19 LAB — POCT INR: INR: 3.3

## 2015-09-20 DIAGNOSIS — T148 Other injury of unspecified body region: Secondary | ICD-10-CM | POA: Diagnosis not present

## 2015-09-20 NOTE — Addendum Note (Signed)
Addended by: Tempie HoistMCNEIL, Devaunte Gasparini M on: 09/20/2015 05:33 PM   Modules accepted: Orders

## 2015-10-04 DIAGNOSIS — T148 Other injury of unspecified body region: Secondary | ICD-10-CM | POA: Diagnosis not present

## 2015-10-05 ENCOUNTER — Other Ambulatory Visit: Payer: Self-pay | Admitting: Family Medicine

## 2015-10-10 ENCOUNTER — Ambulatory Visit (INDEPENDENT_AMBULATORY_CARE_PROVIDER_SITE_OTHER): Payer: Commercial Managed Care - HMO | Admitting: General Practice

## 2015-10-10 ENCOUNTER — Other Ambulatory Visit (INDEPENDENT_AMBULATORY_CARE_PROVIDER_SITE_OTHER): Payer: Commercial Managed Care - HMO | Admitting: Family Medicine

## 2015-10-10 DIAGNOSIS — Z5181 Encounter for therapeutic drug level monitoring: Secondary | ICD-10-CM

## 2015-10-10 DIAGNOSIS — D649 Anemia, unspecified: Secondary | ICD-10-CM | POA: Diagnosis not present

## 2015-10-10 DIAGNOSIS — I4891 Unspecified atrial fibrillation: Secondary | ICD-10-CM

## 2015-10-10 LAB — CBC
HCT: 34.5 % — ABNORMAL LOW (ref 36.0–46.0)
HEMOGLOBIN: 11.7 g/dL — AB (ref 12.0–15.0)
MCHC: 33.8 g/dL (ref 30.0–36.0)
MCV: 96.2 fl (ref 78.0–100.0)
PLATELETS: 181 10*3/uL (ref 150.0–400.0)
RBC: 3.59 Mil/uL — AB (ref 3.87–5.11)
RDW: 15.7 % — ABNORMAL HIGH (ref 11.5–15.5)
WBC: 6.1 10*3/uL (ref 4.0–10.5)

## 2015-10-10 LAB — POCT INR: INR: 3.1

## 2015-10-10 NOTE — Progress Notes (Signed)
Pt was erroneously scheduled for office visit but is here for lab only- for CBC.

## 2015-11-07 ENCOUNTER — Ambulatory Visit (INDEPENDENT_AMBULATORY_CARE_PROVIDER_SITE_OTHER): Payer: Commercial Managed Care - HMO | Admitting: General Practice

## 2015-11-07 DIAGNOSIS — Z5181 Encounter for therapeutic drug level monitoring: Secondary | ICD-10-CM | POA: Diagnosis not present

## 2015-11-07 LAB — POCT INR: INR: 3

## 2015-11-07 NOTE — Patient Instructions (Signed)
Pre visit review using our clinic review tool, if applicable. No additional management support is needed unless otherwise documented below in the visit note. 

## 2015-11-07 NOTE — Progress Notes (Signed)
Agree with advice

## 2015-11-11 ENCOUNTER — Other Ambulatory Visit: Payer: Self-pay | Admitting: Cardiovascular Disease

## 2015-11-21 ENCOUNTER — Other Ambulatory Visit: Payer: Self-pay | Admitting: Cardiovascular Disease

## 2015-12-05 ENCOUNTER — Ambulatory Visit (INDEPENDENT_AMBULATORY_CARE_PROVIDER_SITE_OTHER): Payer: Commercial Managed Care - HMO | Admitting: General Practice

## 2015-12-05 DIAGNOSIS — Z5181 Encounter for therapeutic drug level monitoring: Secondary | ICD-10-CM

## 2015-12-05 DIAGNOSIS — I4891 Unspecified atrial fibrillation: Secondary | ICD-10-CM

## 2015-12-05 LAB — POCT INR: INR: 3

## 2015-12-05 NOTE — Patient Instructions (Signed)
Pre visit review using our clinic review tool, if applicable. No additional management support is needed unless otherwise documented below in the visit note. 

## 2015-12-20 ENCOUNTER — Encounter: Payer: Self-pay | Admitting: Cardiovascular Disease

## 2015-12-20 ENCOUNTER — Ambulatory Visit (INDEPENDENT_AMBULATORY_CARE_PROVIDER_SITE_OTHER): Payer: Commercial Managed Care - HMO | Admitting: Cardiovascular Disease

## 2015-12-20 VITALS — BP 138/76 | HR 62 | Ht 66.5 in | Wt 164.8 lb

## 2015-12-20 DIAGNOSIS — R600 Localized edema: Secondary | ICD-10-CM

## 2015-12-20 DIAGNOSIS — I872 Venous insufficiency (chronic) (peripheral): Secondary | ICD-10-CM

## 2015-12-20 DIAGNOSIS — I5022 Chronic systolic (congestive) heart failure: Secondary | ICD-10-CM

## 2015-12-20 DIAGNOSIS — R609 Edema, unspecified: Secondary | ICD-10-CM

## 2015-12-20 DIAGNOSIS — I4891 Unspecified atrial fibrillation: Secondary | ICD-10-CM

## 2015-12-20 NOTE — Patient Instructions (Signed)
Medication Instructions:  Your physician recommends that you continue on your current medications as directed. Please refer to the Current Medication list given to you today.   Labwork: None ordered   Testing/Procedures: Your physician has requested that you have a lower  venous duplex. This test is an ultrasound of the veins in the legs or arms. It looks at venous blood flow that carries blood from the heart to the legs or arms. Allow one hour for a Lower Venous exam. Allow thirty minutes for an Upper Venous exam. There are no restrictions or special instructions.      Follow-Up: Your physician wants you to follow-up in: 6 months with Dr Prescott ParmaNahser You will receive a reminder letter in the mail two months in advance. If you don't receive a letter, please call our office to schedule the follow-up appointment.   Any Other Special Instructions Will Be Listed Below (If Applicable).     If you need a refill on your cardiac medications before your next appointment, please call your pharmacy.

## 2015-12-20 NOTE — Progress Notes (Signed)
Cardiology Office Note   Date:  12/20/2015   ID:  [name], DOB 05/12/1926, MRN [id number]  PCP:  [name],[name] W, MD  Cardiologist:   [name] MissPhilip Doneta Bayman, MD   No chief complaint on file.  1. Atrial fibrillation 2. Hypertension 3. Benign Positional Vertigo 4. Chronic systolic CHF -  Left ventricle: The cavity size was mildly dilated. Wall thickness was increased in a pattern of mild LVH. The estimated ejection fraction was 20%. Diffuse hypokinesis. - Mitral valve: Mild to moderate regurgitation. - Left atrium: The atrium was mildly dilated. - Right ventricle: Systolic function was mildly to moderately reduced. - Right atrium: The atrium was mildly dilated. - Tricuspid valve: Mild-moderate regurgitation. - Pulmonary arteries: PA peak pressure: 41mm Hg  History of Present Illness:  [name] is a 82 y.o. female with a history of atrial fibrillation. She was cardioverted in October, 2012. She unfortunately has gone back into atrial fibrillation.  She has felt very well. She's not having any episodes of chest pain or shortness breath. She's tolerating the rhythm quite well. Her only complaint is that she's itching from the patches were placed during the cardioversion. She also complains of having very dry and cracking skin on her hands.   Sept. 16, 2013 She has been exercising regularly. She has gained weight since I last saw her ( 11 lbs.) She has no complaints of dyspnea or chest pain.  March 21, 2012:  She was seen by [name] several weeks ago. She was scheduled have an echocardiogram but she has not had that yet. ( was scheduled but we had a big snow storm). She's been having more shortness breath and more leg edema. Scar decreased her Lasix. Her leg swelling is better. He also has some chest heaviness. She is trying to limit her stair climbing.  April 04, 2012:  Her recent echo shows markedly decreased LV function   Left ventricle: The cavity size was mildly  dilated. Wall thickness was increased in a pattern of mild LVH. The estimated ejection fraction was 20%. Diffuse hypokinesis. - Mitral valve: Mild to moderate regurgitation. - Left atrium: The atrium was mildly dilated. - Right ventricle: Systolic function was mildly to moderately reduced. - Right atrium: The atrium was mildly dilated. - Tricuspid valve: Mild-moderate regurgitation. - Pulmonary arteries: PA peak pressure: 41mm Hg (S).  her myoivew study revealed an apical defect - ? Apical thinning. Low risk study.  She continues to have shortness breath. Not sure that she's having any chest pain. It's very difficult to get an accurate history of her. She seems to ramble in her conversation.  July 02, 2012:  Flow had a cardiac cath since of last year. She developed a rash after the cath She has nonobstructive coronary artery disease. Her ejection fraction is severely depressed with an EF of 20-25%. She continues to have atrial fibrillation with a well-controlled ventricular response. She is on chronic Coumadin therapy.  [place] [employer] [employer] [place] about 1 mile) She stays very active. She complains of being short of breath but she remains very active.   Dec. 22, 2014:  [name] continues to have more dyspnea. She added an extra Lasix ( now 4 times a day). She does not think that she's breathing any better since increasing her Lasix from 3 times a day to 4 times a day. She still walks on a regular basis except very bad weather.   04/10/2013:  [name] has  done well. She had a brief episode of sharp CP - lasted 15 seconds. She has not been going to the gym. She is exercising in the AM to a CD. Able to workout without any CP or dyspnea.   Oct. 5, 2015:  [name] is seen back for follow up visit. Short of breath - about the same.  Sprained her ankle - had a cortisone injection Had a birthday recently.  May 24, 2014:  [name] is a 82 y.o. female who presents for her atrial fib  Nov. 15, 2016:  Complains that the metoprolol is thinning her hair.   Breathing is ok Goes to the gym,  Still gets out of breath  Has plantar fascitis.  July 07, 2015:  [name] is doing well.  Occasional blood in her stool  Still being bothered with plantar fascitis  BP is well controlled . INR is theraputic . Tolerating meds fairly well.   Dec. 12, 2017: 82 y.o. female with hx of CHF  Breathing is ok.  Some DOE when she climbs up 13 steps ( at her sons home )   Past Medical History:  Diagnosis Date  . Atrial fib/flutter, transient   . B12 DEFICIENCY 11/25/2009  . CHF 01/27/2009  . Chronic systolic heart failure (HCC) 07/25/2009   a. Myoview 5/12:  Low risk, no ischemia, apical lateral defect probably breast attenuation, inf HK, EF 45%.  b. Echo 8/12: EF 45-50%, inf/inf-septal and apical HK, mild LVH, mild to mod MR, mild to mod TR, PASP 50;  c.  Echo 3/14:  Mild LVH, EF 20%, diff HK, mild to mod MR, mild LAE, mild to mod reduced RVSF, mild RAE, mild to mod TR, PASP 41  . DIVERTICULITIS OF COLON 01/27/2009  . ESSENTIAL HYPERTENSION 09/30/2009  . Hemorrhoids   . Hypertension   . Left ventricular dysfunction   . OSTEOARTHRITIS, KNEE, RIGHT 02/08/2009  . OSTEOPENIA 01/27/2009  . Rectal bleeding   . SMALL BOWEL OBSTRUCTION 05/05/2008    Past Surgical History:  Procedure Laterality Date  . ABDOMINAL HYSTERECTOMY  1996   TAHBSO  . APPENDECTOMY  1951  . CARDIAC CATHETERIZATION  April 14, 2012   nonobstructive CAD; severe LV dysfunction.   . COLON SURGERY  2006   Sigmoid colectomy for diverticulitis  . TONSILLECTOMY  1966     Current Outpatient Prescriptions  Medication Sig Dispense Refill  . Ferrous Gluconate (IRON 27 PO) Take 1 tablet by mouth daily.    . furosemide (LASIX) 40 MG tablet Take 40 mg by mouth daily. Take 1 tablet by mouth in the AM and 0.5 tablet by mouth in the PM    . GuaiFENesin (MUCUS RELIEF ADULT PO)  Take 1 tablet by mouth daily.     Marland Kitchen KLOR-CON M10 10 MEQ tablet TAKE TWO TABLETS BY MOUTH ONCE DAILY 60 tablet 6  . loratadine (CLARITIN) 10 MG tablet Take 10 mg by mouth daily.      . metoprolol (LOPRESSOR) 100 MG tablet TAKE ONE TABLET BY MOUTH TWICE DAILY 180 tablet 1  . Multiple Vitamin (MULTIVITAMIN) tablet Take 1 tablet by mouth daily.      . Multiple Vitamins-Minerals (ICAPS PO) Take 2 tablets by mouth daily.     . rosuvastatin (CRESTOR) 10 MG tablet TAKE ONE TABLET BY MOUTH ONCE DAILY 30 tablet 11  . spironolactone (ALDACTONE) 25 MG tablet Take 0.5 tablets (12.5 mg total) by mouth daily. 45 tablet 3  . warfarin (COUMADIN) 5 MG tablet TAKE AS  DIRECTED BY ANTICOAGULATION CLINIC. 30 tablet 5   No current facility-administered medications for this visit.     Allergies:   Bactrim; Codeine; Contrast media [iodinated diagnostic agents]; Nitrofurantoin; Nitrofurantoin monohyd macro; Penicillins; and Sulfamethoxazole-trimethoprim    Social History:  The patient  reports that she has never smoked. She has never used smokeless tobacco. She reports that she does not drink alcohol or use drugs.   Family History:  The patient's family history includes Heart disease in her father and sister.    ROS:  Please see the history of present illness.    Review of Systems: Constitutional:  denies fever, chills, diaphoresis, appetite change and fatigue.  HEENT: denies photophobia, eye pain, redness, hearing loss, ear pain, congestion, sore throat, rhinorrhea, sneezing, neck pain, neck stiffness and tinnitus.  Respiratory: denies SOB, DOE, cough, chest tightness, and wheezing.  Cardiovascular: denies chest pain, palpitations and leg swelling.  Gastrointestinal: denies nausea, vomiting, abdominal pain, diarrhea, constipation, blood in stool.  Genitourinary: denies dysuria, urgency, frequency, hematuria, flank pain and difficulty urinating.  Musculoskeletal: denies  myalgias, back pain, joint swelling,  arthralgias and gait problem.   Skin: denies pallor, rash and wound.  Neurological: denies dizziness, seizures, syncope, weakness, light-headedness, numbness and headaches.   Hematological: denies adenopathy, easy bruising, personal or family bleeding history.  Psychiatric/ Behavioral: denies suicidal ideation, mood changes, confusion, nervousness, sleep disturbance and agitation.       All other systems are reviewed and negative.    PHYSICAL EXAM: VS:  BP 138/76   Pulse 62   Ht 5' 6.5" (1.689 m)   Wt 164 lb 12.8 oz (74.8 kg)   BMI 26.20 kg/m  , BMI Body mass index is 26.2 kg/m. GEN: Well nourished, well developed, in no acute distress  HEENT: normal  Neck: no JVD, carotid bruits, or masses Cardiac: IRR; no murmurs, rubs, or gallops, 1-2+ edema of left leg.   No edema on the right . Respiratory:  clear to auscultation bilaterally, normal work of breathing GI: soft, nontender, nondistended, + BS MS: no deformity or atrophy  Skin: warm and dry, no rash Neuro:  Strength and sensation are intact Psych: normal   EKG:  EKG is not ordered today.     Recent Labs: 03/31/2015: ALT 26 08/17/2015: BUN 31; Creatinine, Ser 1.39; Potassium 4.3; Sodium 138 10/10/2015: Hemoglobin 11.7; Platelets 181.0    Lipid Panel    Component Value Date/Time   CHOL 157 03/31/2015 0924   TRIG 142.0 03/31/2015 0924   HDL 57.90 03/31/2015 0924   CHOLHDL 3 03/31/2015 0924   VLDL 28.4 03/31/2015 0924   LDLCALC 71 03/31/2015 0924   LDLDIRECT 142.0 12/16/2013 0857      Wt Readings from Last 3 Encounters:  12/20/15 164 lb 12.8 oz (74.8 kg)  09/16/15 155 lb (70.3 kg)  08/17/15 152 lb (68.9 kg)      Other studies Reviewed: Additional studies/ records that were reviewed today include: . Review of the above records demonstrates:    ASSESSMENT AND PLAN:  1. Atrial fibrillation- her rate is well-controlled. Continue current medications.    Seems to be tolerating it well.   2. Hypertension- BP  is well controlled.  Continue meds   3. Benign Positional Vertigo 4. Chronic systolic CHF - - continue current medications. Dong well.    5. Left leg edema :  Likely due to left foot injury. No duplex done.   She is on coumadin.   Will get a lower extremity  Venous duplex  Current medicines are reviewed at length with the patient today.  The patient does not have concerns regarding medicines.  The following changes have been made:  no change  Labs/ tests ordered today include:  No orders of the defined types were placed in this encounter.    Disposition:   FU with me in 6 months      [name] Miss, MD  12/20/2015 8:20 AM    [facility] [address] [place], [place], [place]  [address] Phone: [phone]; Fax: [phone]   [facility]  [address] [place], [place]  [address] [phone]    Fax [phone]

## 2015-12-26 ENCOUNTER — Ambulatory Visit (INDEPENDENT_AMBULATORY_CARE_PROVIDER_SITE_OTHER): Payer: Commercial Managed Care - HMO | Admitting: Family Medicine

## 2015-12-26 ENCOUNTER — Ambulatory Visit (INDEPENDENT_AMBULATORY_CARE_PROVIDER_SITE_OTHER): Payer: Commercial Managed Care - HMO | Admitting: General Practice

## 2015-12-26 VITALS — BP 140/86 | HR 102 | Ht 66.5 in | Wt 162.0 lb

## 2015-12-26 DIAGNOSIS — N183 Chronic kidney disease, stage 3 unspecified: Secondary | ICD-10-CM

## 2015-12-26 DIAGNOSIS — I4891 Unspecified atrial fibrillation: Secondary | ICD-10-CM

## 2015-12-26 DIAGNOSIS — I10 Essential (primary) hypertension: Secondary | ICD-10-CM | POA: Diagnosis not present

## 2015-12-26 DIAGNOSIS — Z5181 Encounter for therapeutic drug level monitoring: Secondary | ICD-10-CM | POA: Diagnosis not present

## 2015-12-26 DIAGNOSIS — R600 Localized edema: Secondary | ICD-10-CM

## 2015-12-26 DIAGNOSIS — D649 Anemia, unspecified: Secondary | ICD-10-CM | POA: Diagnosis not present

## 2015-12-26 DIAGNOSIS — I872 Venous insufficiency (chronic) (peripheral): Secondary | ICD-10-CM | POA: Diagnosis not present

## 2015-12-26 DIAGNOSIS — R609 Edema, unspecified: Secondary | ICD-10-CM

## 2015-12-26 LAB — CBC WITH DIFFERENTIAL/PLATELET
BASOS ABS: 0 10*3/uL (ref 0.0–0.1)
Basophils Relative: 0.6 % (ref 0.0–3.0)
EOS PCT: 3.4 % (ref 0.0–5.0)
Eosinophils Absolute: 0.3 10*3/uL (ref 0.0–0.7)
HCT: 40.3 % (ref 36.0–46.0)
HEMOGLOBIN: 13.4 g/dL (ref 12.0–15.0)
Lymphocytes Relative: 31 % (ref 12.0–46.0)
Lymphs Abs: 2.3 10*3/uL (ref 0.7–4.0)
MCHC: 33.3 g/dL (ref 30.0–36.0)
MCV: 93.5 fl (ref 78.0–100.0)
MONO ABS: 0.8 10*3/uL (ref 0.1–1.0)
MONOS PCT: 11.4 % (ref 3.0–12.0)
Neutro Abs: 4 10*3/uL (ref 1.4–7.7)
Neutrophils Relative %: 53.6 % (ref 43.0–77.0)
Platelets: 141 10*3/uL — ABNORMAL LOW (ref 150.0–400.0)
RBC: 4.31 Mil/uL (ref 3.87–5.11)
RDW: 14.8 % (ref 11.5–15.5)
WBC: 7.4 10*3/uL (ref 4.0–10.5)

## 2015-12-26 LAB — POCT INR: INR: 2.7

## 2015-12-26 NOTE — Progress Notes (Signed)
Pre visit review using our clinic review tool, if applicable. No additional management support is needed unless otherwise documented below in the visit note. 

## 2015-12-26 NOTE — Patient Instructions (Signed)
Pre visit review using our clinic review tool, if applicable. No additional management support is needed unless otherwise documented below in the visit note. 

## 2015-12-26 NOTE — Progress Notes (Signed)
Subjective:     Patient ID: [name], female   DOB: 12/03/1926, 82 y.o.   MRN: [id number]  HPI Patient seen for medical follow-up. She saw cardiology recently. She's had some asymmetric edema left lower extremity greater than right and had venous Dopplers ordered but these were not set up until January 8. She has complained of some left calf pain but this seems to be more in the Achilles region. We suspected some Achilles tendinitis in the past. No history of DVT. She is also on Coumadin.  Patient had recent bleed from her left leg with hemoglobin down to 10.2 and improved to 11.7 last visit here. She is taking an iron supplement. She has atrial fibrillation and remains on metoprolol and Coumadin. She had INR earlier today. No recent bleeding complications. Her weight is up 7 pounds. She has history of systolic heart failure. She denies any orthopnea. She is walking as usual.  Past Medical History:  Diagnosis Date  . Atrial fib/flutter, transient   . B12 DEFICIENCY 11/25/2009  . CHF 01/27/2009  . Chronic systolic heart failure (HCC) 07/25/2009   a. Myoview 5/12:  Low risk, no ischemia, apical lateral defect probably breast attenuation, inf HK, EF 45%.  b. Echo 8/12: EF 45-50%, inf/inf-septal and apical HK, mild LVH, mild to mod MR, mild to mod TR, PASP 50;  c.  Echo 3/14:  Mild LVH, EF 20%, diff HK, mild to mod MR, mild LAE, mild to mod reduced RVSF, mild RAE, mild to mod TR, PASP 41  . DIVERTICULITIS OF COLON 01/27/2009  . ESSENTIAL HYPERTENSION 09/30/2009  . Hemorrhoids   . Hypertension   . Left ventricular dysfunction   . OSTEOARTHRITIS, KNEE, RIGHT 02/08/2009  . OSTEOPENIA 01/27/2009  . Rectal bleeding   . SMALL BOWEL OBSTRUCTION 05/05/2008   Past Surgical History:  Procedure Laterality Date  . ABDOMINAL HYSTERECTOMY  1996   TAHBSO  . APPENDECTOMY  1951  . CARDIAC CATHETERIZATION  April 14, 2012   nonobstructive CAD; severe LV dysfunction.   . COLON SURGERY  2006   Sigmoid  colectomy for diverticulitis  . TONSILLECTOMY  1966    reports that she has never smoked. She has never used smokeless tobacco. She reports that she does not drink alcohol or use drugs. family history includes Heart disease in her father and sister. Allergies  Allergen Reactions  . Bactrim Nausea Only and Other (See Comments)    headache  . Codeine Nausea Only  . Contrast Media [Iodinated Diagnostic Agents] Other (See Comments)    Pt unsure of reaction  . Nitrofurantoin Other (See Comments)    Pt unsure of reaction  . Nitrofurantoin Monohyd Macro Other (See Comments)    Pt unsure of reaction  . Penicillins Nausea And Vomiting  . Sulfamethoxazole-Trimethoprim Other (See Comments)    Pt unsure of reaction     Review of Systems  Constitutional: Positive for unexpected weight change. Negative for fatigue.  Eyes: Negative for visual disturbance.  Respiratory: Negative for cough, chest tightness, shortness of breath and wheezing.   Cardiovascular: Positive for leg swelling. Negative for chest pain and palpitations.  Endocrine: Negative for polydipsia and polyuria.  Neurological: Negative for dizziness, seizures, syncope, weakness, light-headedness and headaches.       Objective:   Physical Exam  Constitutional: She appears well-developed and well-nourished.  Eyes: Pupils are equal, round, and reactive to light.  Neck: Neck supple. No JVD present. No thyromegaly present.  Cardiovascular: Exam reveals no gallop.   Irregular  rhythm  Pulmonary/Chest: Effort normal and breath sounds normal. No respiratory distress. She has no wheezes. She has no rales.  Musculoskeletal: She exhibits edema.  Patient is 1+ edema lower legs bilaterally with multiple varicosities. No localized tenderness left calf  Neurological: She is alert.       Assessment:     #1 chronic atrial fibrillation rate controlled on Coumadin  #2 hypertension stable  #3 recent anemia probably related to left leg  bleed  #4 chronic venous stasis edema and suspect she has some asymmetric venous stasis edema left leg greater than right. Low clinical suspicion for DVT    Plan:     -Repeat CBC -Continue current medications -Flu vaccine already given -Patient reminded about venous doppler which has been set up for early January -Routine follow-up in 3 months consider as needed  Kristian CoveyBruce W Teniola Tseng MD [facility]

## 2016-01-10 ENCOUNTER — Other Ambulatory Visit: Payer: Self-pay | Admitting: Family Medicine

## 2016-01-16 ENCOUNTER — Ambulatory Visit (HOSPITAL_COMMUNITY)
Admission: RE | Admit: 2016-01-16 | Discharge: 2016-01-16 | Disposition: A | Discharge status: Home / Self Care | Payer: Commercial Managed Care - HMO | Source: Ambulatory Visit | Attending: Cardiovascular Disease | Admitting: Cardiovascular Disease

## 2016-01-16 DIAGNOSIS — I872 Venous insufficiency (chronic) (peripheral): Secondary | ICD-10-CM

## 2016-01-16 DIAGNOSIS — R609 Edema, unspecified: Secondary | ICD-10-CM | POA: Insufficient documentation

## 2016-01-23 ENCOUNTER — Ambulatory Visit: Payer: Commercial Managed Care - HMO

## 2016-02-01 ENCOUNTER — Ambulatory Visit (INDEPENDENT_AMBULATORY_CARE_PROVIDER_SITE_OTHER): Payer: Medicare HMO | Admitting: General Practice

## 2016-02-01 ENCOUNTER — Ambulatory Visit (INDEPENDENT_AMBULATORY_CARE_PROVIDER_SITE_OTHER): Payer: Medicare HMO | Admitting: Family Medicine

## 2016-02-01 VITALS — BP 120/78 | HR 90 | Temp 98.7°F | Ht 66.5 in | Wt 159.0 lb

## 2016-02-01 DIAGNOSIS — I4891 Unspecified atrial fibrillation: Secondary | ICD-10-CM

## 2016-02-01 DIAGNOSIS — R05 Cough: Secondary | ICD-10-CM | POA: Diagnosis not present

## 2016-02-01 DIAGNOSIS — L659 Nonscarring hair loss, unspecified: Secondary | ICD-10-CM

## 2016-02-01 DIAGNOSIS — R059 Cough, unspecified: Secondary | ICD-10-CM

## 2016-02-01 DIAGNOSIS — Z5181 Encounter for therapeutic drug level monitoring: Secondary | ICD-10-CM

## 2016-02-01 LAB — CBC WITH DIFFERENTIAL/PLATELET
BASOS ABS: 0 10*3/uL (ref 0.0–0.1)
BASOS PCT: 0.6 % (ref 0.0–3.0)
Eosinophils Absolute: 0.3 10*3/uL (ref 0.0–0.7)
Eosinophils Relative: 3.9 % (ref 0.0–5.0)
HCT: 38.9 % (ref 36.0–46.0)
HEMOGLOBIN: 13.1 g/dL (ref 12.0–15.0)
Lymphocytes Relative: 29.7 % (ref 12.0–46.0)
Lymphs Abs: 1.9 10*3/uL (ref 0.7–4.0)
MCHC: 33.8 g/dL (ref 30.0–36.0)
MCV: 92.5 fl (ref 78.0–100.0)
MONOS PCT: 12.8 % — AB (ref 3.0–12.0)
Monocytes Absolute: 0.8 10*3/uL (ref 0.1–1.0)
NEUTROS ABS: 3.5 10*3/uL (ref 1.4–7.7)
Neutrophils Relative %: 53 % (ref 43.0–77.0)
PLATELETS: 219 10*3/uL (ref 150.0–400.0)
RBC: 4.21 Mil/uL (ref 3.87–5.11)
RDW: 14.5 % (ref 11.5–15.5)
WBC: 6.5 10*3/uL (ref 4.0–10.5)

## 2016-02-01 LAB — POCT INR: INR: 3

## 2016-02-01 LAB — TSH: TSH: 3.93 u[IU]/mL (ref 0.35–4.50)

## 2016-02-01 MED ORDER — BENZONATATE 100 MG PO CAPS: 100.0000 mg | ORAL_CAPSULE | Freq: Three times a day (TID) | ORAL | 0 refills | Status: DC | PRN

## 2016-02-01 NOTE — Patient Instructions (Signed)
Pre visit review using our clinic review tool, if applicable. No additional management support is needed unless otherwise documented below in the visit note. 

## 2016-02-01 NOTE — Progress Notes (Signed)
Pre visit review using our clinic review tool, if applicable. No additional management support is needed unless otherwise documented below in the visit note. 

## 2016-02-01 NOTE — Patient Instructions (Signed)
Follow up for any fever or increased shortness of breath. 

## 2016-02-01 NOTE — Progress Notes (Signed)
Subjective:     Patient ID: [name], female   DOB: 04/08/1926, 83 y.o.   MRN: [id number]  HPI Patient seen with three-week history of cough. Never smoked. Cough is mostly dry. Severe at times. She's tried Robitussin-DM without much relief. She denies any fever or chills. No dyspnea. She has curtailed her usual activities with walking over the past few weeks. No hemoptysis. No appetite changes.  No exacerbating or alleviating factors.  She has some chronic lower extremity edema. Recent venous Doppler per cardiology negative for DVT.  She complains of some diffuse alopecia. This is a new complaint.  She had thyroid functions but these were almost 2 years ago.  She thinks her medications may be related her alopecia.  No clear exacerbating factors.  Past Medical History:  Diagnosis Date  . Atrial fib/flutter, transient   . B12 DEFICIENCY 11/25/2009  . CHF 01/27/2009  . Chronic systolic heart failure (HCC) 07/25/2009   a. Myoview 5/12:  Low risk, no ischemia, apical lateral defect probably breast attenuation, inf HK, EF 45%.  b. Echo 8/12: EF 45-50%, inf/inf-septal and apical HK, mild LVH, mild to mod MR, mild to mod TR, PASP 50;  c.  Echo 3/14:  Mild LVH, EF 20%, diff HK, mild to mod MR, mild LAE, mild to mod reduced RVSF, mild RAE, mild to mod TR, PASP 41  . DIVERTICULITIS OF COLON 01/27/2009  . ESSENTIAL HYPERTENSION 09/30/2009  . Hemorrhoids   . Hypertension   . Left ventricular dysfunction   . OSTEOARTHRITIS, KNEE, RIGHT 02/08/2009  . OSTEOPENIA 01/27/2009  . Rectal bleeding   . SMALL BOWEL OBSTRUCTION 05/05/2008   Past Surgical History:  Procedure Laterality Date  . ABDOMINAL HYSTERECTOMY  1996   TAHBSO  . APPENDECTOMY  1951  . CARDIAC CATHETERIZATION  April 14, 2012   nonobstructive CAD; severe LV dysfunction.   . COLON SURGERY  2006   Sigmoid colectomy for diverticulitis  . TONSILLECTOMY  1966    reports that she has never smoked. She has never used smokeless tobacco. She  reports that she does not drink alcohol or use drugs. family history includes Heart disease in her father and sister. Allergies  Allergen Reactions  . Bactrim Nausea Only and Other (See Comments)    headache  . Codeine Nausea Only  . Contrast Media [Iodinated Diagnostic Agents] Other (See Comments)    Pt unsure of reaction  . Nitrofurantoin Other (See Comments)    Pt unsure of reaction  . Nitrofurantoin Monohyd Macro Other (See Comments)    Pt unsure of reaction  . Penicillins Nausea And Vomiting  . Sulfamethoxazole-Trimethoprim Other (See Comments)    Pt unsure of reaction     Review of Systems  Constitutional: Negative for appetite change, chills, fever and unexpected weight change.  HENT: Negative for sore throat and trouble swallowing.   Respiratory: Positive for cough.   Cardiovascular: Negative for chest pain and palpitations.  Gastrointestinal: Negative for abdominal pain.  Genitourinary: Negative for dysuria.  Neurological: Negative for dizziness, syncope and weakness.       Objective:   Physical Exam  Constitutional: She is oriented to person, place, and time. She appears well-developed and well-nourished.  HENT:  Mouth/Throat: Oropharynx is clear and moist.  Cerumen both canals  Neck: Neck supple.  Cardiovascular: Normal rate.   Pulmonary/Chest: Effort normal and breath sounds normal. No respiratory distress. She has no wheezes. She has no rales.  Musculoskeletal:  no pitting edema  Lymphadenopathy:    She  has no cervical adenopathy.  Neurological: She is alert and oriented to person, place, and time.  Skin:  She does have some general hair thinning. This is not localized but more diffuse. No scalp rash       Assessment:     #1 cough. Suspect recent viral bronchitis. Nonfocal exam. She does not have any fever.  #2 general alopecia  #3 atrial fibrillation on chronic anticoagulation    Plan:     -Check labs with TSH and CBC -avoid tension/traction  on hair. -pt also to get INR today. -Try Tessalon Perles 100 mg every 8 hours as needed for cough suppression -Follow-up immediately for any fever or increasing shortness of breath  Kristian CoveyBruce W Burchette MD [facility]

## 2016-02-13 ENCOUNTER — Other Ambulatory Visit: Payer: Self-pay | Admitting: Family Medicine

## 2016-02-21 ENCOUNTER — Other Ambulatory Visit: Payer: Self-pay | Admitting: Family Medicine

## 2016-02-21 ENCOUNTER — Other Ambulatory Visit: Payer: Self-pay | Admitting: General Practice

## 2016-02-21 MED ORDER — WARFARIN SODIUM 5 MG PO TABS: ORAL_TABLET | ORAL | 1 refills | Status: DC

## 2016-03-26 ENCOUNTER — Ambulatory Visit (INDEPENDENT_AMBULATORY_CARE_PROVIDER_SITE_OTHER): Payer: Medicare HMO | Admitting: General Practice

## 2016-03-26 DIAGNOSIS — I4891 Unspecified atrial fibrillation: Secondary | ICD-10-CM

## 2016-03-26 DIAGNOSIS — Z5181 Encounter for therapeutic drug level monitoring: Secondary | ICD-10-CM | POA: Diagnosis not present

## 2016-03-26 LAB — POCT INR: INR: 3.5

## 2016-03-26 NOTE — Patient Instructions (Signed)
Pre visit review using our clinic review tool, if applicable. No additional management support is needed unless otherwise documented below in the visit note. 

## 2016-03-28 DIAGNOSIS — H52223 Regular astigmatism, bilateral: Secondary | ICD-10-CM | POA: Diagnosis not present

## 2016-03-28 DIAGNOSIS — H5213 Myopia, bilateral: Secondary | ICD-10-CM | POA: Diagnosis not present

## 2016-03-28 DIAGNOSIS — Z961 Presence of intraocular lens: Secondary | ICD-10-CM | POA: Diagnosis not present

## 2016-03-28 DIAGNOSIS — H353132 Nonexudative age-related macular degeneration, bilateral, intermediate dry stage: Secondary | ICD-10-CM | POA: Diagnosis not present

## 2016-03-28 DIAGNOSIS — H524 Presbyopia: Secondary | ICD-10-CM | POA: Diagnosis not present

## 2016-03-28 DIAGNOSIS — H04123 Dry eye syndrome of bilateral lacrimal glands: Secondary | ICD-10-CM | POA: Diagnosis not present

## 2016-04-10 ENCOUNTER — Other Ambulatory Visit: Payer: Self-pay | Admitting: Cardiovascular Disease

## 2016-04-16 ENCOUNTER — Ambulatory Visit (INDEPENDENT_AMBULATORY_CARE_PROVIDER_SITE_OTHER): Payer: Medicare HMO | Admitting: General Practice

## 2016-04-16 DIAGNOSIS — I4891 Unspecified atrial fibrillation: Secondary | ICD-10-CM | POA: Diagnosis not present

## 2016-04-16 DIAGNOSIS — Z5181 Encounter for therapeutic drug level monitoring: Secondary | ICD-10-CM

## 2016-04-16 LAB — POCT INR: INR: 1.4

## 2016-04-16 NOTE — Patient Instructions (Signed)
Pre visit review using our clinic review tool, if applicable. No additional management support is needed unless otherwise documented below in the visit note. 

## 2016-04-30 ENCOUNTER — Ambulatory Visit (INDEPENDENT_AMBULATORY_CARE_PROVIDER_SITE_OTHER): Payer: Medicare HMO | Admitting: General Practice

## 2016-04-30 DIAGNOSIS — Z5181 Encounter for therapeutic drug level monitoring: Secondary | ICD-10-CM | POA: Diagnosis not present

## 2016-04-30 LAB — POCT INR: INR: 2.3

## 2016-04-30 NOTE — Patient Instructions (Signed)
Pre visit review using our clinic review tool, if applicable. No additional management support is needed unless otherwise documented below in the visit note. 

## 2016-05-17 ENCOUNTER — Telehealth: Payer: Self-pay | Admitting: Family Medicine

## 2016-05-25 NOTE — Telephone Encounter (Signed)
Pt called back and has her med

## 2016-05-25 NOTE — Telephone Encounter (Signed)
Pt needs furosemide 40 mg send to [facility] [address]. Pt said dr Elease Hashimotonahser out of town

## 2016-05-28 ENCOUNTER — Ambulatory Visit (INDEPENDENT_AMBULATORY_CARE_PROVIDER_SITE_OTHER): Payer: Medicare HMO | Admitting: General Practice

## 2016-05-28 DIAGNOSIS — Z5181 Encounter for therapeutic drug level monitoring: Secondary | ICD-10-CM

## 2016-05-28 DIAGNOSIS — I4891 Unspecified atrial fibrillation: Secondary | ICD-10-CM

## 2016-05-28 LAB — POCT INR: INR: 2.3

## 2016-05-28 NOTE — Patient Instructions (Signed)
Pre visit review using our clinic review tool, if applicable. No additional management support is needed unless otherwise documented below in the visit note. 

## 2016-05-29 ENCOUNTER — Ambulatory Visit: Payer: Medicare HMO | Admitting: Family Medicine

## 2016-06-05 ENCOUNTER — Ambulatory Visit (INDEPENDENT_AMBULATORY_CARE_PROVIDER_SITE_OTHER): Payer: Medicare HMO | Admitting: Family Medicine

## 2016-06-05 VITALS — BP 132/80 | Temp 98.4°F | Ht 66.5 in | Wt 163.0 lb

## 2016-06-05 DIAGNOSIS — I10 Essential (primary) hypertension: Secondary | ICD-10-CM | POA: Diagnosis not present

## 2016-06-05 DIAGNOSIS — N183 Chronic kidney disease, stage 3 unspecified: Secondary | ICD-10-CM

## 2016-06-05 DIAGNOSIS — E785 Hyperlipidemia, unspecified: Secondary | ICD-10-CM

## 2016-06-05 DIAGNOSIS — K219 Gastro-esophageal reflux disease without esophagitis: Secondary | ICD-10-CM

## 2016-06-05 DIAGNOSIS — I5022 Chronic systolic (congestive) heart failure: Secondary | ICD-10-CM

## 2016-06-05 LAB — BASIC METABOLIC PANEL
BUN: 18 mg/dL (ref 6–23)
CO2: 27 mEq/L (ref 19–32)
CREATININE: 1.02 mg/dL (ref 0.40–1.20)
Calcium: 9.5 mg/dL (ref 8.4–10.5)
Chloride: 102 mEq/L (ref 96–112)
GFR: 54.4 mL/min — ABNORMAL LOW (ref >60.00)
Glucose, Bld: 93 mg/dL (ref 70–99)
Potassium: 4.1 mEq/L (ref 3.5–5.1)
Sodium: 139 mEq/L (ref 135–145)

## 2016-06-05 LAB — LIPID PANEL
CHOL/HDL RATIO: 3
CHOLESTEROL: 168 mg/dL (ref 0–200)
HDL: 62.7 mg/dL (ref >39.00)
LDL Cholesterol: 69 mg/dL (ref 0–99)
NonHDL: 105.1
TRIGLYCERIDES: 182 mg/dL — AB (ref 0.0–149.0)
VLDL: 36.4 mg/dL (ref 0.0–40.0)

## 2016-06-05 LAB — HEPATIC FUNCTION PANEL
ALK PHOS: 58 U/L (ref 39–117)
ALT: 18 U/L (ref 0–35)
AST: 24 U/L (ref 0–37)
Albumin: 4.3 g/dL (ref 3.5–5.2)
BILIRUBIN DIRECT: 0.2 mg/dL (ref 0.0–0.3)
BILIRUBIN TOTAL: 1.1 mg/dL (ref 0.2–1.2)
TOTAL PROTEIN: 6.9 g/dL (ref 6.0–8.3)

## 2016-06-05 NOTE — Patient Instructions (Signed)
Try over the counter Zantac or Pepcid.

## 2016-06-05 NOTE — Progress Notes (Signed)
Subjective:     Patient ID: [name], female   DOB: 07/04/1926, 83 y.o.   MRN: [id number]  HPI Patient here for medical follow-up. She has had ongoing problems for couple years with left plantar fasciitis. She has seen a podiatrist multiple times and has had cortisone injection without much improvement. She's gained a few pounds since last visit. Poor compliance with diet. Chronic problems include history of osteoarthritis, hypertension, hyperlipidemia, atrial fibrillation, chronic systolic heart failure, chronic kidney disease. Medications reviewed. Compliant with all. She has some dyspnea with exertion which is unchanged. No recent chest pains.  Easy bruising. No recent falls.  Past Medical History:  Diagnosis Date  . Atrial fib/flutter, transient   . B12 DEFICIENCY 11/25/2009  . CHF 01/27/2009  . Chronic systolic heart failure (HCC) 07/25/2009   a. Myoview 5/12:  Low risk, no ischemia, apical lateral defect probably breast attenuation, inf HK, EF 45%.  b. Echo 8/12: EF 45-50%, inf/inf-septal and apical HK, mild LVH, mild to mod MR, mild to mod TR, PASP 50;  c.  Echo 3/14:  Mild LVH, EF 20%, diff HK, mild to mod MR, mild LAE, mild to mod reduced RVSF, mild RAE, mild to mod TR, PASP 41  . DIVERTICULITIS OF COLON 01/27/2009  . ESSENTIAL HYPERTENSION 09/30/2009  . Hemorrhoids   . Hypertension   . Left ventricular dysfunction   . OSTEOARTHRITIS, KNEE, RIGHT 02/08/2009  . OSTEOPENIA 01/27/2009  . Rectal bleeding   . SMALL BOWEL OBSTRUCTION 05/05/2008   Past Surgical History:  Procedure Laterality Date  . ABDOMINAL HYSTERECTOMY  1996   TAHBSO  . APPENDECTOMY  1951  . CARDIAC CATHETERIZATION  April 14, 2012   nonobstructive CAD; severe LV dysfunction.   . COLON SURGERY  2006   Sigmoid colectomy for diverticulitis  . TONSILLECTOMY  1966    reports that she has never smoked. She has never used smokeless tobacco. She reports that she does not drink alcohol or use drugs. family history  includes Heart disease in her father and sister. Allergies  Allergen Reactions  . Bactrim Nausea Only and Other (See Comments)    headache  . Codeine Nausea Only  . Contrast Media [Iodinated Diagnostic Agents] Other (See Comments)    Pt unsure of reaction  . Nitrofurantoin Other (See Comments)    Pt unsure of reaction  . Nitrofurantoin Monohyd Macro Other (See Comments)    Pt unsure of reaction  . Penicillins Nausea And Vomiting  . Sulfamethoxazole-Trimethoprim Other (See Comments)    Pt unsure of reaction     Review of Systems  Constitutional: Negative for appetite change and fatigue.  Eyes: Negative for visual disturbance.  Respiratory: Positive for shortness of breath. Negative for cough, chest tightness and wheezing.   Cardiovascular: Negative for chest pain and palpitations.  Gastrointestinal: Negative for abdominal pain.  Genitourinary: Negative for dysuria.  Neurological: Negative for dizziness, seizures, syncope, weakness, light-headedness and headaches.       Objective:   Physical Exam  Constitutional: She appears well-developed and well-nourished.  Eyes: Pupils are equal, round, and reactive to light.  Neck: Neck supple. No JVD present. No thyromegaly present.  Cardiovascular: Normal rate.  Exam reveals no gallop.   Irregular rhythm but rate controlled  Pulmonary/Chest: Effort normal and breath sounds normal. No respiratory distress. She has no wheezes. She has no rales.  Musculoskeletal: She exhibits edema.  Trace edema lower legs bilaterally  Neurological: She is alert.       Assessment:     #  1 hypertension stable and at goal  #2 hyperlipidemia  #3 chronic kidney disease  #4 chronic atrial fibrillation on Coumadin  #5 GERD-she denies any dysphagia or red flags such as pain with swallowing or weight loss    Plan:     -Recheck labs today with lipid panel, basic metabolic panel, hepatic panel -Try over-the-counter Zantac or Pepcid for GERD symptoms  and also discussed dietary modification -Scale back sugar and carbohydrate intake -Routine follow-up in 3 months and sooner as needed -Continue regular follow-up with Coumadin clinic  Kristian CoveyBruce W Hershel Corkery MD [facility]

## 2016-06-18 ENCOUNTER — Other Ambulatory Visit: Payer: Self-pay | Admitting: Cardiovascular Disease

## 2016-06-19 DIAGNOSIS — M71571 Other bursitis, not elsewhere classified, right ankle and foot: Secondary | ICD-10-CM | POA: Diagnosis not present

## 2016-06-19 DIAGNOSIS — M7732 Calcaneal spur, left foot: Secondary | ICD-10-CM | POA: Diagnosis not present

## 2016-06-19 DIAGNOSIS — M19071 Primary osteoarthritis, right ankle and foot: Secondary | ICD-10-CM | POA: Diagnosis not present

## 2016-06-19 DIAGNOSIS — G5761 Lesion of plantar nerve, right lower limb: Secondary | ICD-10-CM | POA: Diagnosis not present

## 2016-06-19 DIAGNOSIS — M722 Plantar fascial fibromatosis: Secondary | ICD-10-CM | POA: Diagnosis not present

## 2016-06-19 DIAGNOSIS — M19072 Primary osteoarthritis, left ankle and foot: Secondary | ICD-10-CM | POA: Diagnosis not present

## 2016-06-19 DIAGNOSIS — M71572 Other bursitis, not elsewhere classified, left ankle and foot: Secondary | ICD-10-CM | POA: Diagnosis not present

## 2016-06-19 DIAGNOSIS — M659 Synovitis and tenosynovitis, unspecified: Secondary | ICD-10-CM | POA: Diagnosis not present

## 2016-06-25 ENCOUNTER — Ambulatory Visit (INDEPENDENT_AMBULATORY_CARE_PROVIDER_SITE_OTHER): Payer: Medicare HMO | Admitting: General Practice

## 2016-06-25 DIAGNOSIS — Z5181 Encounter for therapeutic drug level monitoring: Secondary | ICD-10-CM

## 2016-06-25 DIAGNOSIS — I4891 Unspecified atrial fibrillation: Secondary | ICD-10-CM

## 2016-06-25 LAB — POCT INR: INR: 2.4

## 2016-06-25 NOTE — Patient Instructions (Signed)
Pre visit review using our clinic review tool, if applicable. No additional management support is needed unless otherwise documented below in the visit note. 

## 2016-06-26 DIAGNOSIS — G5761 Lesion of plantar nerve, right lower limb: Secondary | ICD-10-CM | POA: Diagnosis not present

## 2016-06-26 DIAGNOSIS — M7751 Other enthesopathy of right foot: Secondary | ICD-10-CM | POA: Diagnosis not present

## 2016-06-26 DIAGNOSIS — M659 Synovitis and tenosynovitis, unspecified: Secondary | ICD-10-CM | POA: Diagnosis not present

## 2016-06-26 DIAGNOSIS — M71572 Other bursitis, not elsewhere classified, left ankle and foot: Secondary | ICD-10-CM | POA: Diagnosis not present

## 2016-06-26 DIAGNOSIS — M722 Plantar fascial fibromatosis: Secondary | ICD-10-CM | POA: Diagnosis not present

## 2016-07-03 DIAGNOSIS — M7751 Other enthesopathy of right foot: Secondary | ICD-10-CM | POA: Diagnosis not present

## 2016-07-03 DIAGNOSIS — M722 Plantar fascial fibromatosis: Secondary | ICD-10-CM | POA: Diagnosis not present

## 2016-07-03 DIAGNOSIS — M71572 Other bursitis, not elsewhere classified, left ankle and foot: Secondary | ICD-10-CM | POA: Diagnosis not present

## 2016-07-15 ENCOUNTER — Other Ambulatory Visit: Payer: Self-pay | Admitting: Cardiovascular Disease

## 2016-07-23 ENCOUNTER — Ambulatory Visit (INDEPENDENT_AMBULATORY_CARE_PROVIDER_SITE_OTHER): Payer: Medicare HMO | Admitting: General Practice

## 2016-07-23 DIAGNOSIS — Z5181 Encounter for therapeutic drug level monitoring: Secondary | ICD-10-CM

## 2016-07-23 DIAGNOSIS — I4891 Unspecified atrial fibrillation: Secondary | ICD-10-CM

## 2016-07-23 LAB — POCT INR: INR: 2.4

## 2016-07-23 NOTE — Patient Instructions (Signed)
Pre visit review using our clinic review tool, if applicable. No additional management support is needed unless otherwise documented below in the visit note. 

## 2016-07-31 ENCOUNTER — Telehealth: Payer: Self-pay | Admitting: Family Medicine

## 2016-07-31 NOTE — Telephone Encounter (Signed)
Why is she requesting neurologist?

## 2016-07-31 NOTE — Telephone Encounter (Signed)
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  She has seen Dr Elijah Birkom (podiatrist). He states that  she now has nerve damage in her feet, legs, and possible back pain and she should see a neurologist, because the injection no longer help.  She tried to make her own appointment, but they told her that she needs a referral from her primary doctor.  Okay to refer?

## 2016-07-31 NOTE — Telephone Encounter (Signed)
° ° °  Pt [place] to see a neurologist in [place] at the [address] [facility]. She was scheduled to see a neurologist in [place] but couldn't find no one to take her

## 2016-07-31 NOTE — Telephone Encounter (Signed)
Set up follow-up to discuss 

## 2016-08-01 NOTE — Telephone Encounter (Signed)
Appointment made

## 2016-08-06 ENCOUNTER — Encounter: Payer: Self-pay | Admitting: Family Medicine

## 2016-08-06 ENCOUNTER — Ambulatory Visit (INDEPENDENT_AMBULATORY_CARE_PROVIDER_SITE_OTHER): Payer: Medicare HMO | Admitting: Family Medicine

## 2016-08-06 VITALS — BP 124/80 | HR 68 | Temp 98.1°F | Wt 153.8 lb

## 2016-08-06 DIAGNOSIS — E538 Deficiency of other specified B group vitamins: Secondary | ICD-10-CM | POA: Diagnosis not present

## 2016-08-06 DIAGNOSIS — G629 Polyneuropathy, unspecified: Secondary | ICD-10-CM | POA: Diagnosis not present

## 2016-08-06 LAB — VITAMIN B12: Vitamin B-12: 1229 pg/mL — ABNORMAL HIGH (ref 211–911)

## 2016-08-06 LAB — HEMOGLOBIN A1C: Hgb A1c MFr Bld: 5.8 % (ref 4.6–6.5)

## 2016-08-06 NOTE — Patient Instructions (Signed)
Neuropathic Pain Neuropathic pain is pain caused by damage to the nerves that are responsible for certain sensations in your body (sensory nerves). The pain can be caused by damage to:  The sensory nerves that send signals to your spinal cord and brain (peripheral nervous system).  The sensory nerves in your brain or spinal cord (central nervous system).  Neuropathic pain can make you more sensitive to pain. What would be a minor sensation for most people may feel very painful if you have neuropathic pain. This is usually a long-term condition that can be difficult to treat. The type of pain can differ from person to person. It may start suddenly (acute), or it may develop slowly and last for a long time (chronic). Neuropathic pain may come and go as damaged nerves heal or may stay at the same level for years. It often causes emotional distress, loss of sleep, and a lower quality of life. What are the causes? The most common cause of damage to a sensory nerve is diabetes. Many other diseases and conditions can also cause neuropathic pain. Causes of neuropathic pain can be classified as:  Toxic. Many drugs and chemicals can cause toxic damage. The most common cause of toxic neuropathic pain is damage from drug treatment for cancer (chemotherapy).  Metabolic. This type of pain can happen when a disease causes imbalances that damage nerves. Diabetes is the most common of these diseases. Vitamin B deficiency caused by long-term alcohol abuse is another common cause.  Traumatic. Any injury that cuts, crushes, or stretches a nerve can cause damage and pain. A common example is feeling pain after losing an arm or leg (phantom limb pain).  Compression-related. If a sensory nerve gets trapped or compressed for a long period of time, the blood supply to the nerve can be cut off.  Vascular. Many blood vessel diseases can cause neuropathic pain by decreasing blood supply and oxygen to nerves.  Autoimmune.  This type of pain results from diseases in which the body's defense system mistakenly attacks sensory nerves. Examples of autoimmune diseases that can cause neuropathic pain include lupus and multiple sclerosis.  Infectious. Many types of viral infections can damage sensory nerves and cause pain. Shingles infection is a common cause of this type of pain.  Inherited. Neuropathic pain can be a symptom of many diseases that are passed down through families (genetic).  What are the signs or symptoms? The main symptom is pain. Neuropathic pain is often described as:  Burning.  Shock-like.  Stinging.  Hot or cold.  Itching.  How is this diagnosed? No single test can diagnose neuropathic pain. Your health care provider will do a physical exam and ask you about your pain. You may use a pain scale to describe how bad your pain is. You may also have tests to see if you have a high sensitivity to pain and to help find the cause and location of any sensory nerve damage. These tests may include:  Imaging studies, such as: ? X-rays. ? CT scan. ? MRI.  Nerve conduction studies to test how well nerve signals travel through your sensory nerves (electrodiagnostic testing).  Stimulating your sensory nerves through electrodes on your skin and measuring the response in your spinal cord and brain (somatosensory evoked potentials).  How is this treated? Treatment for neuropathic pain may change over time. You may need to try different treatment options or a combination of treatments. Some options include:  Over-the-counter pain relievers.  Prescription medicines. Some medicines   used to treat other conditions may also help neuropathic pain. These include medicines to: ? Control seizures (anticonvulsants). ? Relieve depression (antidepressants).  Prescription-strength pain relievers (narcotics). These are usually used when other pain relievers do not help.  Transcutaneous nerve stimulation (TENS).  This uses electrical currents to block painful nerve signals. The treatment is painless.  Topical and local anesthetics. These are medicines that numb the nerves. They can be injected as a nerve block or applied to the skin.  Alternative treatments, such as: ? Acupuncture. ? Meditation. ? Massage. ? Physical therapy. ? Pain management programs. ? Counseling.  Follow these instructions at home:  Learn as much as you can about your condition.  Take medicines only as directed by your health care provider.  Work closely with all your health care providers to find what works best for you.  Have a good support system at home.  Consider joining a chronic pain support group. Contact a health care provider if:  Your pain treatments are not helping.  You are having side effects from your medicines.  You are struggling with fatigue, mood changes, depression, or anxiety. This information is not intended to replace advice given to you by your health care provider. Make sure you discuss any questions you have with your health care provider. Document Released: 09/22/2003 Document Revised: 07/15/2015 Document Reviewed: 06/04/2013 Elsevier Interactive Patient Education  2018 Elsevier Inc.  

## 2016-08-06 NOTE — Progress Notes (Signed)
Subjective:     Patient ID: [name], female   DOB: 01/04/1927, 83 y.o.   MRN: [id number]  HPI Patient is seen basically requesting neurology referral. She is somewhat of a poor historian. She's had some chronic polyneuropathy pains involving both lower extremities. She states she had "shingles" 2005 has had bilateral foot/lower leg pain since then. She attributes this to history of shingles. She has seen a podiatrist [place] now for several months and has been diagnosed with left plantar fasciitis, bilateral ankle spurs, and neuroma right second interspace. She's had multiple injections with temporary relief.  She describes burning to sharp pain in both feet predominantly- sometimes 9 out of 10 severity. Not related to ambulation. No alleviating factors. She is very leery of medications because of tendency to have side effects with multiple drugs in the past. She does have history of low B12 but no level in a few years. She takes oral B12 daily. She had normal thyroid function back in January. Most recent blood sugar was less than 100. No history of diabetes.  Chronic medical problems reviewed and as below.  She denies any chronic back pain or radiculitis type symptoms  Past Medical History:  Diagnosis Date  . Atrial fib/flutter, transient   . B12 DEFICIENCY 11/25/2009  . CHF 01/27/2009  . Chronic systolic heart failure (HCC) 07/25/2009   a. Myoview 5/12:  Low risk, no ischemia, apical lateral defect probably breast attenuation, inf HK, EF 45%.  b. Echo 8/12: EF 45-50%, inf/inf-septal and apical HK, mild LVH, mild to mod MR, mild to mod TR, PASP 50;  c.  Echo 3/14:  Mild LVH, EF 20%, diff HK, mild to mod MR, mild LAE, mild to mod reduced RVSF, mild RAE, mild to mod TR, PASP 41  . DIVERTICULITIS OF COLON 01/27/2009  . ESSENTIAL HYPERTENSION 09/30/2009  . Hemorrhoids   . Hypertension   . Left ventricular dysfunction   . OSTEOARTHRITIS, KNEE, RIGHT 02/08/2009  . OSTEOPENIA  01/27/2009  . Rectal bleeding   . SMALL BOWEL OBSTRUCTION 05/05/2008   Past Surgical History:  Procedure Laterality Date  . ABDOMINAL HYSTERECTOMY  1996   TAHBSO  . APPENDECTOMY  1951  . CARDIAC CATHETERIZATION  April 14, 2012   nonobstructive CAD; severe LV dysfunction.   . COLON SURGERY  2006   Sigmoid colectomy for diverticulitis  . TONSILLECTOMY  1966    reports that she has never smoked. She has never used smokeless tobacco. She reports that she does not drink alcohol or use drugs. family history includes Emphysema in her unknown relative; Heart disease in her father, sister, and unknown relative. Allergies  Allergen Reactions  . Bactrim Nausea Only and Other (See Comments)    headache  . Codeine Nausea Only  . Contrast Media [Iodinated Diagnostic Agents] Other (See Comments)    Pt unsure of reaction  . Nitrofurantoin Other (See Comments)    Pt unsure of reaction  . Nitrofurantoin Monohyd Macro Other (See Comments)    Pt unsure of reaction  . Penicillins Nausea And Vomiting  . Sulfamethoxazole-Trimethoprim Other (See Comments)    Pt unsure of reaction  \   Review of Systems  Constitutional: Negative for chills and fever.  Respiratory: Negative for shortness of breath.   Cardiovascular: Negative for chest pain.  Endocrine: Negative for polydipsia and polyuria.  Genitourinary: Negative for dysuria.  Neurological: Negative for dizziness, weakness and numbness.       Objective:   Physical Exam  Constitutional: She  is oriented to person, place, and time. She appears well-developed and well-nourished.  Cardiovascular: Normal rate and regular rhythm.   Both feet are warm to touch with 2+ dorsalis pedis pulses. Good capillary refill.  Pulmonary/Chest: Effort normal and breath sounds normal. No respiratory distress. She has no wheezes. She has no rales.  Musculoskeletal: She exhibits no edema.  Neurological: She is alert and oriented to person, place, and time.   Full-strength lower extremities. Normal sensory function to touch.       Assessment:     Polyneuropathy involving both lower extremities. She describes this for least 13 years duration. No history of diabetes. She has history of B12 deficiency and is on oral replacement    Plan:     -Patient requesting neurology referral and we will set up -Check labs today with B12, hemoglobin A1c, serum protein electrophoresis -We discussed potential medication options for her neuropathy but she is not interested. She is very leery in general of side effects from medications.  Kristian CoveyBruce W Sydell Prowell MD [facility]

## 2016-08-09 LAB — PROTEIN ELECTROPHORESIS, SERUM
ALPHA-2-GLOBULIN: 0.7 g/dL (ref 0.5–0.9)
Albumin ELP: 4.2 g/dL (ref 3.8–4.8)
Alpha-1-Globulin: 0.3 g/dL (ref 0.2–0.3)
BETA 2: 0.3 g/dL (ref 0.2–0.5)
BETA GLOBULIN: 0.5 g/dL (ref 0.4–0.6)
Gamma Globulin: 0.9 g/dL (ref 0.8–1.7)
Total Protein, Serum Electrophoresis: 7 g/dL (ref 6.1–8.1)

## 2016-08-10 ENCOUNTER — Telehealth: Payer: Self-pay | Admitting: Family Medicine

## 2016-08-10 NOTE — Telephone Encounter (Signed)
[name] pt returned your call °

## 2016-08-10 NOTE — Telephone Encounter (Signed)
Patient is aware of lab results.

## 2016-08-31 NOTE — Patient Instructions (Signed)
Pre visit review using our clinic review tool, if applicable. No additional management support is needed unless otherwise documented below in the visit note. 

## 2016-09-03 ENCOUNTER — Ambulatory Visit (INDEPENDENT_AMBULATORY_CARE_PROVIDER_SITE_OTHER): Payer: Medicare HMO | Admitting: General Practice

## 2016-09-03 DIAGNOSIS — I4891 Unspecified atrial fibrillation: Secondary | ICD-10-CM

## 2016-09-03 DIAGNOSIS — Z5181 Encounter for therapeutic drug level monitoring: Secondary | ICD-10-CM | POA: Diagnosis not present

## 2016-09-03 LAB — POCT INR: INR: 2.3

## 2016-09-07 ENCOUNTER — Other Ambulatory Visit: Payer: Self-pay | Admitting: Cardiovascular Disease

## 2016-09-11 ENCOUNTER — Ambulatory Visit (INDEPENDENT_AMBULATORY_CARE_PROVIDER_SITE_OTHER): Payer: Medicare HMO | Admitting: Cardiovascular Disease

## 2016-09-11 ENCOUNTER — Encounter (INDEPENDENT_AMBULATORY_CARE_PROVIDER_SITE_OTHER): Payer: Self-pay

## 2016-09-11 ENCOUNTER — Encounter: Payer: Self-pay | Admitting: Cardiovascular Disease

## 2016-09-11 VITALS — BP 130/74 | HR 95 | Ht 66.5 in | Wt 154.8 lb

## 2016-09-11 DIAGNOSIS — I10 Essential (primary) hypertension: Secondary | ICD-10-CM

## 2016-09-11 DIAGNOSIS — I482 Chronic atrial fibrillation, unspecified: Secondary | ICD-10-CM

## 2016-09-11 NOTE — Patient Instructions (Signed)

## 2016-09-11 NOTE — Progress Notes (Signed)
Cardiology Office Note   Date:  09/11/2016   ID:  [name], DOB Oct 24, 1926, MRN [id number]  PCP:  [name], MD  Cardiologist:   Kristeen Miss, MD   Chief Complaint  Patient presents with  . Follow-up    atrial fib, CHF   1. Atrial fibrillation 2. Hypertension 3. Benign Positional Vertigo 4. Chronic systolic CHF -  Left ventricle: The cavity size was mildly dilated. Wall thickness was increased in a pattern of mild LVH. The estimated ejection fraction was 20%. Diffuse hypokinesis. - Mitral valve: Mild to moderate regurgitation. - Left atrium: The atrium was mildly dilated. - Right ventricle: Systolic function was mildly to moderately reduced. - Right atrium: The atrium was mildly dilated. - Tricuspid valve: Mild-moderate regurgitation. - Pulmonary arteries: PA peak pressure: 41mm Hg  History of Present Illness:  [name] is a 83 y.o. female with a history of atrial fibrillation. She was cardioverted in October, 2012. She unfortunately has gone back into atrial fibrillation.  She has felt very well. She's not having any episodes of chest pain or shortness breath. She's tolerating the rhythm quite well. Her only complaint is that she's itching from the patches were placed during the cardioversion. She also complains of having very dry and cracking skin on her hands.   Sept. 16, 2013 She has been exercising regularly. She has gained weight since I last saw her ( 11 lbs.) She has no complaints of dyspnea or chest pain.  March 21, 2012:  She was seen by Lorin Picket several weeks ago. She was scheduled have an echocardiogram but she has not had that yet. ( was scheduled but we had a big snow storm). She's been having more shortness breath and more leg edema. Scar decreased her Lasix. Her leg swelling is better. He also has some chest heaviness. She is trying to limit her stair climbing.  April 04, 2012:  Her recent echo shows markedly decreased LV function    Left ventricle: The cavity size was mildly dilated. Wall thickness was increased in a pattern of mild LVH. The estimated ejection fraction was 20%. Diffuse hypokinesis. - Mitral valve: Mild to moderate regurgitation. - Left atrium: The atrium was mildly dilated. - Right ventricle: Systolic function was mildly to moderately reduced. - Right atrium: The atrium was mildly dilated. - Tricuspid valve: Mild-moderate regurgitation. - Pulmonary arteries: PA peak pressure: 41mm Hg (S).  her myoivew study revealed an apical defect - ? Apical thinning. Low risk study.  She continues to have shortness breath. Not sure that she's having any chest pain. It's very difficult to get an accurate history of her. She seems to ramble in her conversation.  July 02, 2012:  Flow had a cardiac cath since of last year. She developed a rash after the cath She has nonobstructive coronary artery disease. Her ejection fraction is severely depressed with an EF of 20-25%. She continues to have atrial fibrillation with a well-controlled ventricular response. She is on chronic Coumadin therapy.  She goes to the [place] twice a week. She walks to [employer] ([place] to [place],- about 1 mile) She stays very active. She complains of being short of breath but she remains very active.   Dec. 22, 2014:  [name] continues to have more dyspnea. She added an extra Lasix ( now 4 times a day). She does not think that she's breathing any better since increasing her Lasix from 3 times a day to 4 times a day. She still walks on a  regular basis except very bad weather.   04/10/2013:  [name] has done well. She had a brief episode of sharp CP - lasted 15 seconds. She has not been going to the gym. She is exercising in the AM to a CD. Able to workout without any CP or dyspnea.   Oct. 5, 2015:  [name] is seen back for follow up visit. Short of breath - about the same.  Sprained her ankle - had a cortisone injection Had a  birthday recently.  May 24, 2014:  [name] is a 83 y.o. female who presents for her atrial fib  Nov. 15, 2016:  Complains that the metoprolol is thinning her hair.   Breathing is ok Goes to the gym,  Still gets out of breath  Has plantar fascitis.  July 07, 2015:  [name] is doing well.  Occasional blood in her stool  Still being bothered with plantar fascitis  BP is well controlled . INR is theraputic . Tolerating meds fairly well.   Dec. 12, 2017: 83 y.o. female with hx of CHF  Breathing is ok.  Some DOE when she climbs up 13 steps ( at her sons home )   Sept. 4, 2018:  Doing well from a cardiac standpoint.   Has some DOE climbing 13 steps .  No CP  Has some gas pains.  Has some dizziness,   BP looks great   Past Medical History:  Diagnosis Date  . Atrial fib/flutter, transient   . B12 DEFICIENCY 11/25/2009  . CHF 01/27/2009  . Chronic systolic heart failure (HCC) 07/25/2009   a. Myoview 5/12:  Low risk, no ischemia, apical lateral defect probably breast attenuation, inf HK, EF 45%.  b. Echo 8/12: EF 45-50%, inf/inf-septal and apical HK, mild LVH, mild to mod MR, mild to mod TR, PASP 50;  c.  Echo 3/14:  Mild LVH, EF 20%, diff HK, mild to mod MR, mild LAE, mild to mod reduced RVSF, mild RAE, mild to mod TR, PASP 41  . DIVERTICULITIS OF COLON 01/27/2009  . ESSENTIAL HYPERTENSION 09/30/2009  . Hemorrhoids   . Hypertension   . Left ventricular dysfunction   . OSTEOARTHRITIS, KNEE, RIGHT 02/08/2009  . OSTEOPENIA 01/27/2009  . Rectal bleeding   . SMALL BOWEL OBSTRUCTION 05/05/2008    Past Surgical History:  Procedure Laterality Date  . ABDOMINAL HYSTERECTOMY  1996   TAHBSO  . APPENDECTOMY  1951  . CARDIAC CATHETERIZATION  April 14, 2012   nonobstructive CAD; severe LV dysfunction.   . COLON SURGERY  2006   Sigmoid colectomy for diverticulitis  . TONSILLECTOMY  1966     Current Outpatient Prescriptions  Medication Sig Dispense Refill  . Ferrous Gluconate (IRON  27 PO) Take 1 tablet by mouth daily.    . furosemide (LASIX) 40 MG tablet Take 40 mg by mouth every morning. Take 1/2 tablet by mouth in the evening    . KLOR-CON M10 10 MEQ tablet TAKE TWO TABLETS BY MOUTH ONCE DAILY 180 tablet 1  . loratadine (CLARITIN) 10 MG tablet Take 10 mg by mouth daily.      . metoprolol tartrate (LOPRESSOR) 100 MG tablet TAKE 1 TABLET BY MOUTH TWICE DAILY 180 tablet 0  . Multiple Vitamin (MULTIVITAMIN) tablet Take 1 tablet by mouth daily.      . Multiple Vitamins-Minerals (ICAPS PO) Take 2 tablets by mouth daily.     . rosuvastatin (CRESTOR) 10 MG tablet TAKE ONE TABLET BY MOUTH ONCE DAILY 30 tablet 11  .  spironolactone (ALDACTONE) 25 MG tablet TAKE ONE-HALF TABLET BY MOUTH ONCE DAILY 45 tablet 1  . warfarin (COUMADIN) 5 MG tablet TAKE AS DIRECTED BY ANTICOAGULATION CLINIC. 90 tablet 1   No current facility-administered medications for this visit.     Allergies:   Bactrim; Codeine; Contrast media [iodinated diagnostic agents]; Nitrofurantoin; Nitrofurantoin monohyd macro; Penicillins; and Sulfamethoxazole-trimethoprim    Social History:  The patient  reports that she has never smoked. She has never used smokeless tobacco. She reports that she does not drink alcohol or use drugs.   Family History:  The patient's family history includes Emphysema in her unknown relative; Heart disease in her father, sister, and unknown relative.    ROS:  Please see the history of present illness.    Review of Systems: Constitutional:  denies fever, chills, diaphoresis, appetite change and fatigue.  HEENT: denies photophobia, eye pain, redness, hearing loss, ear pain, congestion, sore throat, rhinorrhea, sneezing, neck pain, neck stiffness and tinnitus.  Respiratory: denies SOB, DOE, cough, chest tightness, and wheezing.  Cardiovascular: denies chest pain, palpitations and leg swelling.  Gastrointestinal: denies nausea, vomiting, abdominal pain, diarrhea, constipation, blood in  stool.  Genitourinary: denies dysuria, urgency, frequency, hematuria, flank pain and difficulty urinating.  Musculoskeletal: denies  myalgias, back pain, joint swelling, arthralgias and gait problem.   Skin: denies pallor, rash and wound.  Neurological: denies dizziness, seizures, syncope, weakness, light-headedness, numbness and headaches.   Hematological: denies adenopathy, easy bruising, personal or family bleeding history.  Psychiatric/ Behavioral: denies suicidal ideation, mood changes, confusion, nervousness, sleep disturbance and agitation.       All other systems are reviewed and negative.    PHYSICAL EXAM: VS:  BP 130/74   Pulse 95   Ht 5' 6.5" (1.689 m)   Wt 154 lb 12.8 oz (70.2 kg)   BMI 24.61 kg/m  , BMI Body mass index is 24.61 kg/m. GEN: Well nourished, well developed, in no acute distress  HEENT: normal  Neck: no JVD, carotid bruits, or masses Cardiac: Irreg. Irrreg. ; no murmurs, rubs, or gallops, 1-2+ edema of left leg.   No edema on the right . Respiratory:  clear to auscultation bilaterally, normal work of breathing GI: soft, nontender, nondistended, + BS MS: no deformity or atrophy  Skin: warm and dry, no rash Neuro:  Strength and sensation are intact Psych: normal   EKG:  EKG is ordered today.  Sept. 4, 2018:   Atrial fib with HR of 95.  Occasional aberrantly conducted beats Poor R wave progression    Recent Labs: 02/01/2016: Hemoglobin 13.1; Platelets 219.0; TSH 3.93 06/05/2016: ALT 18; BUN 18; Creatinine, Ser 1.02; Potassium 4.1; Sodium 139    Lipid Panel    Component Value Date/Time   CHOL 168 06/05/2016 0911   TRIG 182.0 (H) 06/05/2016 0911   HDL 62.70 06/05/2016 0911   CHOLHDL 3 06/05/2016 0911   VLDL 36.4 06/05/2016 0911   LDLCALC 69 06/05/2016 0911   LDLDIRECT 142.0 12/16/2013 0857      Wt Readings from Last 3 Encounters:  09/11/16 154 lb 12.8 oz (70.2 kg)  08/06/16 153 lb 12.8 oz (69.8 kg)  06/05/16 163 lb (73.9 kg)      Other  studies Reviewed: Additional studies/ records that were reviewed today include: . Review of the above records demonstrates:    ASSESSMENT AND PLAN:  1.  Chronic Atrial fibrillation- her rate is well-controlled. Continue current medications.    Seems to be tolerating it well.   2. Hypertension-  Her blood pressure remains well-controlled. Continue current medications.  3. Benign Positional Vertigo 4. Chronic systolic CHF - - continue current medications. Dong well.    5. Left leg edema :  Likely due to left foot injury. No duplex done.   She is on coumadin.      Current medicines are reviewed at length with the patient today.  The patient does not have concerns regarding medicines.  The following changes have been made:  no change  Labs/ tests ordered today include:  No orders of the defined types were placed in this encounter.    Disposition:   FU with me in 6 months      Kristeen Miss, MD  09/11/2016 11:31 AM    [facility] [address] [place], [place], [place]  [address] Phone: [phone]; Fax: [phone]

## 2016-09-25 ENCOUNTER — Ambulatory Visit (INDEPENDENT_AMBULATORY_CARE_PROVIDER_SITE_OTHER): Payer: Medicare HMO | Admitting: Neurology

## 2016-09-25 ENCOUNTER — Encounter: Payer: Self-pay | Admitting: Neurology

## 2016-09-25 VITALS — BP 151/94 | HR 80 | Ht 66.5 in | Wt 154.0 lb

## 2016-09-25 DIAGNOSIS — M79671 Pain in right foot: Secondary | ICD-10-CM

## 2016-09-25 DIAGNOSIS — M79672 Pain in left foot: Secondary | ICD-10-CM | POA: Diagnosis not present

## 2016-09-25 NOTE — Progress Notes (Signed)
Subjective:    Patient ID: [name] is a 83 y.o. female.  HPI     Huston Foley, MD, PhD [facility] [address], [address] [address] [place], [place] [address]  Dear Dr. Caryl Never,   I saw your patient, [name], upon your kind request in my neurologic clinic today for initial consultation of her lower extremity paresthesias, concern for neuropathy. The patient is accompanied by her neighbor/friend, Anka, today. As you know, Ms. Fiebelkorn is an 83 year old right-handed woman with an underlying complex medical history of hypertension, history of small bowel obstruction, B12 deficiency, A. fib or flutter, CHF, diverticulosis and diverticulitis, osteoarthritis, and osteopenia, who reports intermittent foot pain and burning sensation. She has no permanent numbness, typically no ongoing tingling. She has been followed by a foot doctor who told her that she has neuropathy. She also has hammertoes on the right and neuroma on the right. She has undergone steroid injection which she felt made the foot pain worse. She has no one-sided weakness or numbness and no symptoms in her hands. She has not fallen thankfully. She reports a history of shingles in 2005 which affected her mid or lower back. She is worried that she has nerve damage from her shingles. I reviewed your office note from 08/06/2016. She had blood work at the time including B12 level, A1c, and SPEP. Her A1c was 5.8, vitamin B12 above 1200, SPEP benign.   Her Past Medical History Is Significant For: Past Medical History:  Diagnosis Date  . Atrial fib/flutter, transient   . B12 DEFICIENCY 11/25/2009  . CHF 01/27/2009  . Chronic systolic heart failure (HCC) 07/25/2009   a. Myoview 5/12:  Low risk, no ischemia, apical lateral defect probably breast attenuation, inf HK, EF 45%.  b. Echo 8/12: EF 45-50%, inf/inf-septal and apical HK, mild LVH, mild to mod MR, mild to mod TR, PASP 50;  c.  Echo 3/14:  Mild LVH,  EF 20%, diff HK, mild to mod MR, mild LAE, mild to mod reduced RVSF, mild RAE, mild to mod TR, PASP 41  . DIVERTICULITIS OF COLON 01/27/2009  . ESSENTIAL HYPERTENSION 09/30/2009  . Hemorrhoids   . Hypertension   . Left ventricular dysfunction   . OSTEOARTHRITIS, KNEE, RIGHT 02/08/2009  . OSTEOPENIA 01/27/2009  . Rectal bleeding   . SMALL BOWEL OBSTRUCTION 05/05/2008    Her Past Surgical History Is Significant For: Past Surgical History:  Procedure Laterality Date  . ABDOMINAL HYSTERECTOMY  1996   TAHBSO  . APPENDECTOMY  1951  . CARDIAC CATHETERIZATION  April 14, 2012   nonobstructive CAD; severe LV dysfunction.   . COLON SURGERY  2006   Sigmoid colectomy for diverticulitis  . TONSILLECTOMY  1966    Her Family History Is Significant For: Family History  Problem Relation Age of Onset  . Heart disease Father   . Heart disease Sister   . Heart disease Unknown   . Emphysema Unknown     Her Social History Is Significant For: Social History   Social History  . Marital status: Widowed    Spouse name: N/A  . Number of children: N/A  . Years of education: N/A   Social History Main Topics  . Smoking status: Never Smoker  . Smokeless tobacco: Never Used  . Alcohol use No  . Drug use: No  . Sexual activity: Not Currently   Other Topics Concern  . None   Social History Narrative  . None    Her Allergies  Are:  Allergies  Allergen Reactions  . Bactrim Nausea Only and Other (See Comments)    headache  . Codeine Nausea Only  . Contrast Media [Iodinated Diagnostic Agents] Other (See Comments)    Pt unsure of reaction  . Nitrofurantoin Other (See Comments)    Pt unsure of reaction  . Nitrofurantoin Monohyd Macro Other (See Comments)    Pt unsure of reaction  . Penicillins Nausea And Vomiting  . Sulfamethoxazole-Trimethoprim Other (See Comments)    Pt unsure of reaction  :   Her Current Medications Are:  Outpatient Encounter Prescriptions as of 09/25/2016  Medication  Sig  . Ferrous Gluconate (IRON 27 PO) Take 1 tablet by mouth daily.  . furosemide (LASIX) 40 MG tablet Take 40 mg by mouth every morning. Take 1/2 tablet by mouth in the evening  . KLOR-CON M10 10 MEQ tablet TAKE TWO TABLETS BY MOUTH ONCE DAILY  . loratadine (CLARITIN) 10 MG tablet Take 10 mg by mouth daily.    . metoprolol tartrate (LOPRESSOR) 100 MG tablet TAKE 1 TABLET BY MOUTH TWICE DAILY  . Multiple Vitamin (MULTIVITAMIN) tablet Take 1 tablet by mouth daily.    . Multiple Vitamins-Minerals (ICAPS PO) Take 2 tablets by mouth daily.   . rosuvastatin (CRESTOR) 10 MG tablet TAKE ONE TABLET BY MOUTH ONCE DAILY  . spironolactone (ALDACTONE) 25 MG tablet TAKE ONE-HALF TABLET BY MOUTH ONCE DAILY  . warfarin (COUMADIN) 5 MG tablet TAKE AS DIRECTED BY ANTICOAGULATION CLINIC.   No facility-administered encounter medications on file as of 09/25/2016.   :   Review of Systems:  Out of a complete 14 point review of systems, all are reviewed and negative with the exception of these symptoms as listed below:  Review of Systems  Neurological:       Pt presents today to discuss her neuropathy. Pt reports that she had shingles which damaged her nerves and now her nerves all over her body are painful. Pt is reluctant to take any medications, such as pain medications, because of the effect on kidneys.    Objective:  Neurological Exam  Physical Exam Physical Examination:   Vitals:   09/25/16 1013  BP: (!) 151/94  Pulse: 80    General Examination: The patient is a very pleasant 83 y.o. female in no acute distress. She appears well-developed and well-nourished and well groomed.   HEENT: Normocephalic, atraumatic, pupils are equal, round and reactive to light and accommodation. Extraocular tracking is good without limitation to gaze excursion or nystagmus noted. Normal smooth pursuit is noted. Hearing is grossly intact. Face is symmetric with normal facial animation and normal facial sensation.  Speech is clear with no dysarthria noted. There is no hypophonia. There is no lip, neck/head, jaw or voice tremor. Neck is supple with full range of passive and active motion. There are no carotid bruits on auscultation. Oropharynx exam reveals: moderate mouth dryness, adequate dental hygiene. Tongue protrudes centrally and palate elevates symmetrically.  Chest: Clear to auscultation without wheezing, rhonchi or crackles noted.  Heart: S1+S2+0, regular and normal without murmurs, rubs or gallops noted.   Abdomen: Soft, non-tender and non-distended with normal bowel sounds appreciated on auscultation.  Extremities: There is trace pitting edema in the distal lower extremities bilaterally. Pedal pulses are intact.  Skin: Warm and dry without trophic changes noted. There are some varicose veins but very prominent spider veins in both legs, particularly foot area.   Musculoskeletal: exam reveals no obvious joint deformities, tenderness or joint swelling or  erythema, with the exception of left ankle swelling and some tenderness. She has tenderness in both heels.   Neurologically:  Mental status: The patient is awake, alert and oriented in all 4 spheres. Her immediate and remote memory, attention, language skills and fund of knowledge are appropriate. There is no evidence of aphasia, agnosia, apraxia or anomia. Speech is clear with normal prosody and enunciation. Thought process is linear. Mood is normal and affect is normal.  Cranial nerves II - XII are as described above under HEENT exam. In addition: shoulder shrug is normal with equal shoulder height noted. Motor exam: Normal bulk, strength and tone is noted. There is no drift, tremor or rebound. Romberg is negative. Reflexes are 1-2+ in both upper extremities, trace in both knees and trace in both ankles. Babinski: Toes are flexor bilaterally. Fine motor skills and coordination: intact with normal finger taps, normal hand movements, normal rapid  alternating patting, normal foot taps and normal foot agility.  Cerebellar testing: No dysmetria or intention tremor on finger to nose testing. Heel to shin is unremarkable bilaterally. There is no truncal or gait ataxia.  Sensory exam: intact to light touch, pinprick, vibration, temperature sense in the upper and lower extremities.  Gait, station and balance: She stands with difficulty. No veering to one side is noted. No leaning to one side is noted. Posture is age-appropriate and stance is narrow based. Gait shows cautious and slow gait, preserved arm swing.   Assessment and Plan:   In summary, Annalaya Wile is a very pleasant 83 y.o.-year old female with an underlying complex medical history of hypertension, history of small bowel obstruction, B12 deficiency, A. fib or flutter, CHF, diverticulosis and diverticulitis, osteoarthritis, and osteopenia, who presents for neurologic consultation of her intermittent foot pain. She has a history of plantar fasciitis and her foot pain is more likely in keeping with heel spurs her plantar fasciitis rather than peripheral neuropathy. Exam is benign. She is advised that shingles does not cause neuropathy. Her history and exam are not telltale for peripheral neuropathy. She had some recent blood work which I reviewed. Nevertheless, would like to proceed with additional testing from our end of things with EMG and nerve conduction studies. We will have to be cautious what with her Coumadin treatment. We will call her with her test results and take it from there. I did not suggest any new medications today. I answered all her questions today and she was in agreement.   Thank you very much for allowing me to participate in the care of this nice patient. If I can be of any further assistance to you please do not hesitate to call me at [phone].  Sincerely,   Huston Foley, MD, PhD

## 2016-09-25 NOTE — Patient Instructions (Signed)
As discussed, we are doing the nerve and muscle test to your feet and we will call you with results. Please follow up with foot doctor as planned.

## 2016-10-15 ENCOUNTER — Ambulatory Visit (INDEPENDENT_AMBULATORY_CARE_PROVIDER_SITE_OTHER): Payer: Medicare HMO | Admitting: General Practice

## 2016-10-15 DIAGNOSIS — Z5181 Encounter for therapeutic drug level monitoring: Secondary | ICD-10-CM | POA: Diagnosis not present

## 2016-10-15 DIAGNOSIS — Z7901 Long term (current) use of anticoagulants: Secondary | ICD-10-CM

## 2016-10-15 LAB — POCT INR: INR: 2.1

## 2016-10-15 NOTE — Patient Instructions (Signed)
Pre visit review using our clinic review tool, if applicable. No additional management support is needed unless otherwise documented below in the visit note. 

## 2016-10-22 ENCOUNTER — Other Ambulatory Visit: Payer: Self-pay | Admitting: Family Medicine

## 2016-10-22 ENCOUNTER — Other Ambulatory Visit: Payer: Self-pay | Admitting: General Practice

## 2016-10-22 MED ORDER — WARFARIN SODIUM 5 MG PO TABS: ORAL_TABLET | ORAL | 1 refills | Status: DC

## 2016-11-01 ENCOUNTER — Other Ambulatory Visit: Payer: Self-pay | Admitting: Family Medicine

## 2016-11-26 ENCOUNTER — Ambulatory Visit: Payer: Medicare HMO | Admitting: General Practice

## 2016-11-26 DIAGNOSIS — Z7901 Long term (current) use of anticoagulants: Secondary | ICD-10-CM

## 2016-11-26 DIAGNOSIS — I4891 Unspecified atrial fibrillation: Secondary | ICD-10-CM | POA: Diagnosis not present

## 2016-11-26 LAB — POCT INR: INR: 1.8

## 2016-11-26 NOTE — Patient Instructions (Addendum)
Pre visit review using our clinic review tool, if applicable. No additional management support is needed unless otherwise documented below in the visit note.  Take extra 1/2 tablet today and then continue to take 1/2 tablet daily except 1 tablet on Sun/Tues and Thursdays.  Re-check in 4 weeks.

## 2016-12-12 ENCOUNTER — Ambulatory Visit: Payer: Medicare HMO | Admitting: Family Medicine

## 2016-12-12 ENCOUNTER — Encounter: Payer: Self-pay | Admitting: Family Medicine

## 2016-12-12 VITALS — BP 110/80 | HR 89 | Temp 98.3°F | Wt 154.7 lb

## 2016-12-12 DIAGNOSIS — G629 Polyneuropathy, unspecified: Secondary | ICD-10-CM | POA: Diagnosis not present

## 2016-12-12 DIAGNOSIS — I4891 Unspecified atrial fibrillation: Secondary | ICD-10-CM

## 2016-12-12 DIAGNOSIS — I10 Essential (primary) hypertension: Secondary | ICD-10-CM

## 2016-12-12 DIAGNOSIS — N183 Chronic kidney disease, stage 3 unspecified: Secondary | ICD-10-CM

## 2016-12-12 LAB — BASIC METABOLIC PANEL
BUN: 21 mg/dL (ref 6–23)
CALCIUM: 9.1 mg/dL (ref 8.4–10.5)
CO2: 27 meq/L (ref 19–32)
CREATININE: 1.04 mg/dL (ref 0.40–1.20)
Chloride: 103 mEq/L (ref 96–112)
GFR: 53.13 mL/min — AB (ref >60.00)
Glucose, Bld: 121 mg/dL — ABNORMAL HIGH (ref 70–99)
Potassium: 4 mEq/L (ref 3.5–5.1)
SODIUM: 141 meq/L (ref 135–145)

## 2016-12-12 NOTE — Progress Notes (Signed)
Subjective:     Patient ID: [name], female   DOB: 08/14/1926, 83 y.o.   MRN: [id number]  HPI  Patient seen for medical follow-up. She has multiple chronic problems including history of hypertension, chronic systolic heart failure, atrial fibrillation on chronic Coumadin. She's had some chronic foot problems with neuropathy. Blood screening was unrevealing of any specific etiology. She was scheduled for nerve conduction studies per neurology but decided against doing those. She continues to have some burning in her feet. She continues to have plantar fasciitis issues. She had some orthotics made her foot specialist but these have made her feet hurt worse she states.  She complains of frequent burning sensation in epigastric region which she is convinced is worse after taking her potassium supplement. She has chronic kidney disease and is on furosemide and takes K-Lor once daily. Denies any exertional chest pain. She has some chronic exertional dyspnea. Denies orthopnea.  Past Medical History:  Diagnosis Date  . Atrial fib/flutter, transient   . B12 DEFICIENCY 11/25/2009  . CHF 01/27/2009  . Chronic systolic heart failure (HCC) 07/25/2009   a. Myoview 5/12:  Low risk, no ischemia, apical lateral defect probably breast attenuation, inf HK, EF 45%.  b. Echo 8/12: EF 45-50%, inf/inf-septal and apical HK, mild LVH, mild to mod MR, mild to mod TR, PASP 50;  c.  Echo 3/14:  Mild LVH, EF 20%, diff HK, mild to mod MR, mild LAE, mild to mod reduced RVSF, mild RAE, mild to mod TR, PASP 41  . DIVERTICULITIS OF COLON 01/27/2009  . ESSENTIAL HYPERTENSION 09/30/2009  . Hemorrhoids   . Hypertension   . Left ventricular dysfunction   . OSTEOARTHRITIS, KNEE, RIGHT 02/08/2009  . OSTEOPENIA 01/27/2009  . Rectal bleeding   . SMALL BOWEL OBSTRUCTION 05/05/2008   Past Surgical History:  Procedure Laterality Date  . ABDOMINAL HYSTERECTOMY  1996   TA[facility]BSO  . APPENDECTOMY  1951  . CARDIAC CATHETERIZATION  April 14, 2012   nonobstructive CAD; severe LV dysfunction.   . COLON SURGERY  2006   Sigmoid colectomy for diverticulitis  . TONSILLECTOMY  1966    reports that  has never smoked. she has never used smokeless tobacco. She reports that she does not drink alcohol or use drugs. family history includes Emphysema in her unknown relative; Heart disease in her father, sister, and unknown relative. Allergies  Allergen Reactions  . Bactrim Nausea Only and Other (See Comments)    headache  . Codeine Nausea Only  . Contrast Media [Iodinated Diagnostic Agents] Other (See Comments)    Pt unsure of reaction  . Nitrofurantoin Other (See Comments)    Pt unsure of reaction  . Nitrofurantoin Monohyd Macro Other (See Comments)    Pt unsure of reaction  . Penicillins Nausea And Vomiting  . Sulfamethoxazole-Trimethoprim Other (See Comments)    Pt unsure of reaction    Review of Systems  Constitutional: Positive for fatigue.  Eyes: Negative for visual disturbance.  Respiratory: Positive for shortness of breath. Negative for cough, chest tightness and wheezing.   Cardiovascular: Positive for leg swelling. Negative for chest pain and palpitations.  Gastrointestinal: Negative for abdominal pain.  Endocrine: Negative for polydipsia and polyuria.  Neurological: Negative for dizziness, seizures, syncope, weakness, light-headedness and headaches.       Objective:   Physical Exam  Constitutional: She appears well-developed and well-nourished.  Eyes: Pupils are equal, round, and reactive to light.  Neck: Neck supple. No JVD present. No thyromegaly present.  Cardiovascular: Normal rate. Exam reveals no gallop.  Pulmonary/Chest: Effort normal and breath sounds normal. No respiratory distress. She has no wheezes. She has no rales.  Musculoskeletal: She exhibits edema.  She has trace to 1+ edema lower legs bilaterally  Neurological: She is alert.       Assessment:     #1 history of atrial fibrillation  currently rate controlled and on Coumadin  #2 hypertension stable and at goal  #3 history of chronic systolic heart failure  #4 epigastric burning possibly related to medication.    Plan:     -We discussed potential trial of over-the-counter antacid such as Zantac or Pepcid and she declines -Check basic metabolic panel. If potassium remains stable consider holding potassium and recheck basic metabolic panel 3-4 weeks later -INR/coumadin addressed per coumadin clinic.  Kristian CoveyBruce W Psalm Schappell MD  [facility]

## 2016-12-24 ENCOUNTER — Ambulatory Visit: Payer: Medicare HMO | Admitting: General Practice

## 2016-12-24 DIAGNOSIS — I4891 Unspecified atrial fibrillation: Secondary | ICD-10-CM

## 2016-12-24 DIAGNOSIS — Z7901 Long term (current) use of anticoagulants: Secondary | ICD-10-CM | POA: Diagnosis not present

## 2016-12-24 LAB — POCT INR: INR: 2.5

## 2016-12-24 NOTE — Patient Instructions (Addendum)
Pre visit review using our clinic review tool, if applicable. No additional management support is needed unless otherwise documented below in the visit note.  Continue to take 1/2 tablet daily except 1 tablet on Sun/Tues and Thursdays.  Re-check in 4 weeks.

## 2016-12-28 ENCOUNTER — Other Ambulatory Visit: Payer: Self-pay | Admitting: Cardiovascular Disease

## 2017-01-15 ENCOUNTER — Ambulatory Visit: Payer: Medicare HMO | Admitting: Family Medicine

## 2017-01-15 ENCOUNTER — Encounter: Payer: Self-pay | Admitting: Family Medicine

## 2017-01-15 VITALS — BP 110/78 | HR 86 | Temp 98.8°F | Wt 154.6 lb

## 2017-01-15 DIAGNOSIS — I10 Essential (primary) hypertension: Secondary | ICD-10-CM | POA: Diagnosis not present

## 2017-01-15 DIAGNOSIS — R1013 Epigastric pain: Secondary | ICD-10-CM | POA: Diagnosis not present

## 2017-01-15 LAB — BASIC METABOLIC PANEL
BUN: 24 mg/dL — AB (ref 6–23)
CHLORIDE: 101 meq/L (ref 96–112)
CO2: 32 meq/L (ref 19–32)
CREATININE: 1.07 mg/dL (ref 0.40–1.20)
Calcium: 9.2 mg/dL (ref 8.4–10.5)
GFR: 51.41 mL/min — ABNORMAL LOW (ref >60.00)
Glucose, Bld: 88 mg/dL (ref 70–99)
POTASSIUM: 3.9 meq/L (ref 3.5–5.1)
Sodium: 141 mEq/L (ref 135–145)

## 2017-01-15 NOTE — Progress Notes (Signed)
Subjective:     Patient ID: [name], female   DOB: 05/30/1926, 84 y.o.   MRN: [id number]  HPI Patient recently seen with dyspepsia. She was concerned may be related to her potassium supplement. Her basic chemistries were normal and we had her hold her K-Lor-con. Her dyspepsia is improved. She still has occasional reflux but much improved. No vomiting. Appetite and weight are stable. She does take furosemide but also on Aldactone. Peripheral edema stable  She is still exercising daily with walking and goes to the gym most days. No falls  Past Medical History:  Diagnosis Date  . Atrial fib/flutter, transient   . B12 DEFICIENCY 11/25/2009  . CHF 01/27/2009  . Chronic systolic heart failure (HCC) 07/25/2009   a. Myoview 5/12:  Low risk, no ischemia, apical lateral defect probably breast attenuation, inf HK, EF 45%.  b. Echo 8/12: EF 45-50%, inf/inf-septal and apical HK, mild LVH, mild to mod MR, mild to mod TR, PASP 50;  c.  Echo 3/14:  Mild LVH, EF 20%, diff HK, mild to mod MR, mild LAE, mild to mod reduced RVSF, mild RAE, mild to mod TR, PASP 41  . DIVERTICULITIS OF COLON 01/27/2009  . ESSENTIAL HYPERTENSION 09/30/2009  . Hemorrhoids   . Hypertension   . Left ventricular dysfunction   . OSTEOARTHRITIS, KNEE, RIGHT 02/08/2009  . OSTEOPENIA 01/27/2009  . Rectal bleeding   . SMALL BOWEL OBSTRUCTION 05/05/2008   Past Surgical History:  Procedure Laterality Date  . ABDOMINAL HYSTERECTOMY  1996   TAHBSO  . APPENDECTOMY  1951  . CARDIAC CATHETERIZATION  April 14, 2012   nonobstructive CAD; severe LV dysfunction.   . COLON SURGERY  2006   Sigmoid colectomy for diverticulitis  . TONSILLECTOMY  1966    reports that  has never smoked. she has never used smokeless tobacco. She reports that she does not drink alcohol or use drugs. family history includes Emphysema in her unknown relative; Heart disease in her father, sister, and unknown relative. Allergies  Allergen Reactions  . Bactrim  Nausea Only and Other (See Comments)    headache  . Codeine Nausea Only  . Contrast Media [Iodinated Diagnostic Agents] Other (See Comments)    Pt unsure of reaction  . Nitrofurantoin Other (See Comments)    Pt unsure of reaction  . Nitrofurantoin Monohyd Macro Other (See Comments)    Pt unsure of reaction  . Penicillins Nausea And Vomiting  . Sulfamethoxazole-Trimethoprim Other (See Comments)    Pt unsure of reaction      Review of Systems  Constitutional: Negative for fatigue.  Eyes: Negative for visual disturbance.  Respiratory: Negative for cough, chest tightness, shortness of breath and wheezing.   Cardiovascular: Positive for leg swelling. Negative for chest pain and palpitations.  Gastrointestinal: Negative for abdominal pain.  Neurological: Negative for dizziness, seizures, syncope, weakness, light-headedness and headaches.       Objective:   Physical Exam  Constitutional: She appears well-developed and well-nourished.  Cardiovascular: Normal rate.  Pulmonary/Chest: Effort normal and breath sounds normal. No respiratory distress. She has no wheezes. She has no rales.  Musculoskeletal:  Trace edema lower legs bilaterally       Assessment:     #1 recent dyspepsia improved after stopping potassium supplement  #2 chronic bilateral leg edema stable    Plan:     -Recheck basic metabolic panel -We recommended support hose in the past but she has declined -Routine follow-up in 3 months and sooner as needed  Eulas Post MD [facility]

## 2017-01-21 ENCOUNTER — Ambulatory Visit: Payer: Medicare HMO

## 2017-01-28 ENCOUNTER — Ambulatory Visit: Payer: Medicare HMO

## 2017-02-05 ENCOUNTER — Other Ambulatory Visit: Payer: Self-pay | Admitting: Cardiovascular Disease

## 2017-02-11 ENCOUNTER — Ambulatory Visit: Payer: Medicare HMO | Admitting: General Practice

## 2017-02-11 DIAGNOSIS — I4891 Unspecified atrial fibrillation: Secondary | ICD-10-CM

## 2017-02-11 DIAGNOSIS — Z7901 Long term (current) use of anticoagulants: Secondary | ICD-10-CM

## 2017-02-11 LAB — POCT INR: INR: 1.6

## 2017-02-11 NOTE — Patient Instructions (Addendum)
Pre visit review using our clinic review tool, if applicable. No additional management support is needed unless otherwise documented below in the visit note.  Take extra 1/2 tablet today and tomorrow (2/4 and 2/5) and then continue to take 1/2 tablet daily except 1 tablet on Sun/Tues and Thursdays.  Re-check in 4 weeks.

## 2017-03-11 ENCOUNTER — Ambulatory Visit (INDEPENDENT_AMBULATORY_CARE_PROVIDER_SITE_OTHER): Payer: Medicare HMO | Admitting: General Practice

## 2017-03-11 DIAGNOSIS — Z7901 Long term (current) use of anticoagulants: Secondary | ICD-10-CM | POA: Diagnosis not present

## 2017-03-11 DIAGNOSIS — I4891 Unspecified atrial fibrillation: Secondary | ICD-10-CM | POA: Diagnosis not present

## 2017-03-11 LAB — POCT INR: INR: 2.3

## 2017-03-11 NOTE — Patient Instructions (Addendum)
Pre visit review using our clinic review tool, if applicable. No additional management support is needed unless otherwise documented below in the visit note.  Continue to take 1/2 tablet daily except 1 tablet on Sun/Tues and Thursdays.  Re-check in 4 weeks.    

## 2017-03-25 ENCOUNTER — Telehealth: Payer: Self-pay | Admitting: Family Medicine

## 2017-03-25 NOTE — Telephone Encounter (Signed)
Placed in Dr Burchette's folder 

## 2017-03-25 NOTE — Telephone Encounter (Signed)
[employer] transportation forms to be filled out.  Placed in dr's folder. Call pt for pick up at [phone]

## 2017-03-26 NOTE — Telephone Encounter (Signed)
Form completed and ready to pick up.  Patient is aware.

## 2017-03-27 NOTE — Telephone Encounter (Signed)
Patient requested to have the forms faxed to Lorret[employer] Harpourtney [employer]orie at [phone]  I faxed the forms and gave the patient the fax confirmation.  I have gave a copy to scan

## 2017-04-03 ENCOUNTER — Telehealth: Payer: Self-pay | Admitting: Family Medicine

## 2017-04-03 NOTE — Telephone Encounter (Signed)
Pt called, [employer] does not have any record of receiving completed form, even though we had confirmation that it went through. Pt requesting to refax, call to confirm with [employer] and call patient. She had upcoming appointment and they would not pick her up. Please help?

## 2017-04-03 NOTE — Telephone Encounter (Signed)
[employer] number pt gave phone number [phone].

## 2017-04-03 NOTE — Telephone Encounter (Signed)
Re-faxed and confirmed form.  Patient is aware.

## 2017-04-08 ENCOUNTER — Ambulatory Visit (INDEPENDENT_AMBULATORY_CARE_PROVIDER_SITE_OTHER): Payer: Medicare HMO | Admitting: General Practice

## 2017-04-08 DIAGNOSIS — I4891 Unspecified atrial fibrillation: Secondary | ICD-10-CM | POA: Diagnosis not present

## 2017-04-08 DIAGNOSIS — Z7901 Long term (current) use of anticoagulants: Secondary | ICD-10-CM | POA: Diagnosis not present

## 2017-04-08 LAB — POCT INR: INR: 1.7

## 2017-04-08 NOTE — Patient Instructions (Addendum)
Pre visit review using our clinic review tool, if applicable. No additional management support is needed unless otherwise documented below in the visit note.  Take extra 1/2 tablet today and then continue to take 1/2 tablet daily except 1 tablet on Sun/Tues and Thursdays.  Re-check in 4 weeks.   

## 2017-04-16 ENCOUNTER — Ambulatory Visit: Payer: Medicare HMO | Admitting: Family Medicine

## 2017-04-23 ENCOUNTER — Encounter: Payer: Self-pay | Admitting: Family Medicine

## 2017-04-23 ENCOUNTER — Ambulatory Visit (INDEPENDENT_AMBULATORY_CARE_PROVIDER_SITE_OTHER): Payer: Medicare HMO | Admitting: Family Medicine

## 2017-04-23 VITALS — BP 120/80 | HR 72 | Temp 98.9°F | Wt 153.6 lb

## 2017-04-23 DIAGNOSIS — G629 Polyneuropathy, unspecified: Secondary | ICD-10-CM | POA: Diagnosis not present

## 2017-04-23 DIAGNOSIS — E785 Hyperlipidemia, unspecified: Secondary | ICD-10-CM

## 2017-04-23 MED ORDER — ATORVASTATIN CALCIUM 20 MG PO TABS: 20.0000 mg | ORAL_TABLET | Freq: Every day | ORAL | 11 refills | Status: DC

## 2017-04-23 NOTE — Progress Notes (Signed)
Subjective:     Patient ID: [name], female   DOB: 05-Feb-1926, 84 y.o.   MRN: [id number]  HPI Patient has multiple chronic problems including history of hypertension, chronic systolic heart failure, atrial fibrillation, GERD, osteoarthritis, chronic kidney disease stage III, history of B12 deficiency, macular degeneration, hyperlipidemia  She is on Crestor for hyperlipidemia. She is convinced this is causing abdominal cramping occasional diarrhea. She has left this off couple times any symptoms have resolved. She would like to consider change of medication  Continues to have some peripheral neuropathy symptoms. She is very reluctant to take any medications such as gabapentin or Lyrica after discussion of potential side effects. B12 level VIII months ago was normal. Previous serum protein a pheresis normal. No history of hyperglycemia.  Left shoulder pain. Denies specific injury. Symptoms presents for several weeks. She does some lifting including some repetitive things overhead. Pain is localized left shoulder and worse with abduction. No neck pain. No definite weakness  Ongoing chronic problems with plantar fasciitis left foot. She is seen multiple specialists has had injections as well as inserts and conditions which not help much. She is coping fairly well and is not interested in further intervention this time  Past Medical History:  Diagnosis Date  . Atrial fib/flutter, transient   . B12 DEFICIENCY 11/25/2009  . CHF 01/27/2009  . Chronic systolic heart failure (HCC) 07/25/2009   a. Myoview 5/12:  Low risk, no ischemia, apical lateral defect probably breast attenuation, inf HK, EF 45%.  b. Echo 8/12: EF 45-50%, inf/inf-septal and apical HK, mild LVH, mild to mod MR, mild to mod TR, PASP 50;  c.  Echo 3/14:  Mild LVH, EF 20%, diff HK, mild to mod MR, mild LAE, mild to mod reduced RVSF, mild RAE, mild to mod TR, PASP 41  . DIVERTICULITIS OF COLON 01/27/2009  . ESSENTIAL HYPERTENSION  09/30/2009  . Hemorrhoids   . Hypertension   . Left ventricular dysfunction   . OSTEOARTHRITIS, KNEE, RIGHT 02/08/2009  . OSTEOPENIA 01/27/2009  . Rectal bleeding   . SMALL BOWEL OBSTRUCTION 05/05/2008   Past Surgical History:  Procedure Laterality Date  . ABDOMINAL HYSTERECTOMY  1996   TAHBSO  . APPENDECTOMY  1951  . CARDIAC CATHETERIZATION  April 14, 2012   nonobstructive CAD; severe LV dysfunction.   . COLON SURGERY  2006   Sigmoid colectomy for diverticulitis  . TONSILLECTOMY  1966    reports that she has never smoked. She has never used smokeless tobacco. She reports that she does not drink alcohol or use drugs. family history includes Emphysema in her unknown relative; Heart disease in her father, sister, and unknown relative. Allergies  Allergen Reactions  . Bactrim Nausea Only and Other (See Comments)    headache  . Codeine Nausea Only  . Contrast Media [Iodinated Diagnostic Agents] Other (See Comments)    Pt unsure of reaction  . Nitrofurantoin Other (See Comments)    Pt unsure of reaction  . Nitrofurantoin Monohyd Macro Other (See Comments)    Pt unsure of reaction  . Penicillins Nausea And Vomiting  . Sulfamethoxazole-Trimethoprim Other (See Comments)    Pt unsure of reaction       Review of Systems  Constitutional: Negative for appetite change, fatigue and unexpected weight change.  Eyes: Negative for visual disturbance.  Respiratory: Negative for cough, chest tightness, shortness of breath and wheezing.   Cardiovascular: Negative for chest pain, palpitations and leg swelling.  Neurological: Negative for dizziness, seizures, syncope, weakness,  light-headedness and headaches.       Objective:   Physical Exam  Constitutional: She is oriented to person, place, and time. She appears well-developed and well-nourished.  Cardiovascular:  Irregular rhythm rate controlled  Pulmonary/Chest: Effort normal and breath sounds normal. No respiratory distress. She has no  wheezes. She has no rales.  Musculoskeletal:  Trace nonpitting edema lower legs bilaterally which is chronic and stable  Left shoulder for range of motion. No localized tenderness. She has some pain with abduction greater than 90.  Neurological: She is alert and oriented to person, place, and time.       Assessment:     #1 hyperlipidemia. Patient's concerned for possible side effects of Crestor as above  #2 the shoulder pain. Suspect rotator cuff tendinitis or bursitis. Fortunately she has good range of motion  #3 chronic left plantar fasciitis  #4 polyneuropathy. History of B12 deficiency and currently on regular B12 supplementation  #5 history of chronic age fibrillation rate controlled and on Coumadin    Plan:     -Discussed pros and cons of steroid injection left shoulder and at this point she declines. We have also recommended she avoid repetitive overhead lifting activities for now but try to maintain range of motion -Discontinue Crestor and start Lipitor 20 mg once daily -Follow-up in 2 months and repeat lipids and hepatic then -Continue with regular B12 supplementation -Continue close follow-up with Coumadin clinic  Kristian CoveyBruce W Levin Dagostino MD [place] [facility]  [name] MD [place] [facility]

## 2017-04-23 NOTE — Patient Instructions (Signed)
Stop the Crestor  Start Lipitor 20 mg once daily  We will need to repeat lipids and liver panel in 2 months.

## 2017-05-06 ENCOUNTER — Ambulatory Visit (INDEPENDENT_AMBULATORY_CARE_PROVIDER_SITE_OTHER): Payer: Medicare HMO | Admitting: General Practice

## 2017-05-06 DIAGNOSIS — I4891 Unspecified atrial fibrillation: Secondary | ICD-10-CM | POA: Diagnosis not present

## 2017-05-06 DIAGNOSIS — Z7901 Long term (current) use of anticoagulants: Secondary | ICD-10-CM | POA: Diagnosis not present

## 2017-05-06 LAB — POCT INR: INR: 1.8

## 2017-05-06 NOTE — Patient Instructions (Addendum)
Pre visit review using our clinic review tool, if applicable. No additional management support is needed unless otherwise documented below in the visit note.  Take extra 1/2 tablet today and then change dosage and take 1 tablet daily except 1/2 tablet on Monday/Wed/Fridays.  Re-check in 4 weeks.

## 2017-05-24 DIAGNOSIS — H1712 Central corneal opacity, left eye: Secondary | ICD-10-CM | POA: Diagnosis not present

## 2017-05-24 DIAGNOSIS — Z961 Presence of intraocular lens: Secondary | ICD-10-CM | POA: Diagnosis not present

## 2017-05-24 DIAGNOSIS — H353131 Nonexudative age-related macular degeneration, bilateral, early dry stage: Secondary | ICD-10-CM | POA: Diagnosis not present

## 2017-05-24 DIAGNOSIS — H43813 Vitreous degeneration, bilateral: Secondary | ICD-10-CM | POA: Diagnosis not present

## 2017-05-24 DIAGNOSIS — H5213 Myopia, bilateral: Secondary | ICD-10-CM | POA: Diagnosis not present

## 2017-05-24 DIAGNOSIS — H04123 Dry eye syndrome of bilateral lacrimal glands: Secondary | ICD-10-CM | POA: Diagnosis not present

## 2017-06-04 ENCOUNTER — Encounter: Payer: Self-pay | Admitting: Cardiovascular Disease

## 2017-06-04 ENCOUNTER — Ambulatory Visit: Payer: Medicare HMO | Admitting: Cardiovascular Disease

## 2017-06-04 VITALS — BP 100/70 | HR 76 | Ht 66.0 in | Wt 156.0 lb

## 2017-06-04 DIAGNOSIS — I10 Essential (primary) hypertension: Secondary | ICD-10-CM | POA: Diagnosis not present

## 2017-06-04 DIAGNOSIS — I482 Chronic atrial fibrillation, unspecified: Secondary | ICD-10-CM

## 2017-06-04 DIAGNOSIS — I5022 Chronic systolic (congestive) heart failure: Secondary | ICD-10-CM

## 2017-06-04 LAB — BASIC METABOLIC PANEL
BUN/Creatinine Ratio: 18 (ref 12–28)
BUN: 20 mg/dL (ref 8–27)
CALCIUM: 8.9 mg/dL (ref 8.7–10.3)
CHLORIDE: 103 mmol/L (ref 96–106)
CO2: 24 mmol/L (ref 20–29)
Creatinine, Ser: 1.09 mg/dL — ABNORMAL HIGH (ref 0.57–1.00)
GFR calc non Af Amer: 45 mL/min/{1.73_m2} — ABNORMAL LOW (ref >59)
GFR, EST AFRICAN AMERICAN: 52 mL/min/{1.73_m2} — AB (ref >59)
GLUCOSE: 92 mg/dL (ref 65–99)
POTASSIUM: 4.2 mmol/L (ref 3.5–5.2)
Sodium: 143 mmol/L (ref 134–144)

## 2017-06-04 NOTE — Patient Instructions (Addendum)
Medication Instructions:  Your physician has recommended you make the following change in your medication:   STOP Spironolactone (Aldactone)   Labwork: TODAY - basic metabolic panel   Testing/Procedures: None Ordered   Follow-Up: Your physician recommends that you schedule a follow-up appointment in: 3 months with Norma Fredrickson, NP    If you need a refill on your cardiac medications before your next appointment, please call your pharmacy.   Thank you for choosing [facility]! Eligha Bridegroom, RN [phone]

## 2017-06-04 NOTE — Progress Notes (Signed)
Cardiology Office Note   Date:  06/04/2017   ID:  [name], DOB [birth year], MRN [id number]  PCP:  [name], MD  Cardiologist:   Kristeen Miss, MD   Chief Complaint  Patient presents with  . Atrial Fibrillation   1. Atrial fibrillation 2. Hypertension 3. Benign Positional Vertigo 4. Chronic systolic CHF -  Left ventricle: The cavity size was mildly dilated. Wall thickness was increased in a pattern of mild LVH. The estimated ejection fraction was 20%. Diffuse hypokinesis. - Mitral valve: Mild to moderate regurgitation. - Left atrium: The atrium was mildly dilated. - Right ventricle: Systolic function was mildly to moderately reduced. - Right atrium: The atrium was mildly dilated. - Tricuspid valve: Mild-moderate regurgitation. - Pulmonary arteries: PA peak pressure: 41mm Hg  History of Present Illness:  [name] is a 84 y.o. female with a history of atrial fibrillation. She was cardioverted in October, 2012. She unfortunately has gone back into atrial fibrillation.  She has felt very well. She's not having any episodes of chest pain or shortness breath. She's tolerating the rhythm quite well. Her only complaint is that she's itching from the patches were placed during the cardioversion. She also complains of having very dry and cracking skin on her hands.   Sept. 16, 2013 She has been exercising regularly. She has gained weight since I last saw her ( 11 lbs.) She has no complaints of dyspnea or chest pain.  March 21, 2012:  She was seen by Lorin Picket several weeks ago. She was scheduled have an echocardiogram but she has not had that yet. ( was scheduled but we had a big snow storm). She's been having more shortness breath and more leg edema. Scar decreased her Lasix. Her leg swelling is better. He also has some chest heaviness. She is trying to limit her stair climbing.  April 04, 2012:  Her recent echo shows markedly decreased LV function   Left  ventricle: The cavity size was mildly dilated. Wall thickness was increased in a pattern of mild LVH. The estimated ejection fraction was 20%. Diffuse hypokinesis. - Mitral valve: Mild to moderate regurgitation. - Left atrium: The atrium was mildly dilated. - Right ventricle: Systolic function was mildly to moderately reduced. - Right atrium: The atrium was mildly dilated. - Tricuspid valve: Mild-moderate regurgitation. - Pulmonary arteries: PA peak pressure: 41mm Hg (S).  her myoivew study revealed an apical defect - ? Apical thinning. Low risk study.  She continues to have shortness breath. Not sure that she's having any chest pain. It's very difficult to get an accurate history of her. She seems to ramble in her conversation.  July 02, 2012:  Flow had a cardiac cath since of last year. She developed a rash after the cath She has nonobstructive coronary artery disease. Her ejection fraction is severely depressed with an EF of 20-25%. She continues to have atrial fibrillation with a well-controlled ventricular response. She is on chronic Coumadin therapy.  She goes to the [place] twice a week. She walks to [employer] ([place],- about 1 mile) She stays very active. She complains of being short of breath but she remains very active.   Dec. 22, 2014:  [name] continues to have more dyspnea. She added an extra Lasix ( now 4 times a day). She does not think that she's breathing any better since increasing her Lasix from 3 times a day to 4 times a day. She still walks on a regular basis except very bad  weather.   04/10/2013:  [name] has done well. She had a brief episode of sharp CP - lasted 15 seconds. She has not been going to the gym. She is exercising in the AM to a CD. Able to workout without any CP or dyspnea.   Oct. 5, 2015:  [name] is seen back for follow up visit. Short of breath - about the same.  Sprained her ankle - had a cortisone injection Had a birthday  recently.  May 24, 2014:  [name] is a 84 y.o. female who presents for her atrial fib  Nov. 15, 2016:  Complains that the metoprolol is thinning her hair.   Breathing is ok Goes to the gym,  Still gets out of breath  Has plantar fascitis.  July 07, 2015:  [name] is doing well.  Occasional blood in her stool  Still being bothered with plantar fascitis  BP is well controlled . INR is theraputic . Tolerating meds fairly well.   Dec. 12, 2017: 84 y.o. female with hx of CHF  Breathing is ok.  Some DOE when she climbs up 13 steps ( at her sons home )   Sept. 4, 2018:  Doing well from a cardiac standpoint.   Has some DOE climbing 13 steps .  No CP  Has some gas pains.  Has some dizziness,   BP looks great   Jun 04, 2017  [name] is seen today for follow-up visit.  She has chronic atrial fibrillation.   She is on chronic Coumadin therapy.  She is been having some chest pain that she thinks is caused by the Aldactone.  She has a history of hypertension.  Her blood pressure has been very well controlled.   She has CP while at rest.   Typically watching TV.   CP occurs about 1-2 hours after she takes the [place].   Still goes to the gym 3 days a week .    Past Medical History:  Diagnosis Date  . Atrial fib/flutter, transient   . B12 DEFICIENCY 11/25/2009  . CHF 01/27/2009  . Chronic systolic heart failure (HCC) 07/25/2009   a. Myoview 5/12:  Low risk, no ischemia, apical lateral defect probably breast attenuation, inf HK, EF 45%.  b. Echo 8/12: EF 45-50%, inf/inf-septal and apical HK, mild LVH, mild to mod MR, mild to mod TR, PASP 50;  c.  Echo 3/14:  Mild LVH, EF 20%, diff HK, mild to mod MR, mild LAE, mild to mod reduced RVSF, mild RAE, mild to mod TR, PASP 41  . DIVERTICULITIS OF COLON 01/27/2009  . ESSENTIAL HYPERTENSION 09/30/2009  . Hemorrhoids   . Hypertension   . Left ventricular dysfunction   . OSTEOARTHRITIS, KNEE, RIGHT 02/08/2009  . OSTEOPENIA 01/27/2009  . Rectal  bleeding   . SMALL BOWEL OBSTRUCTION 05/05/2008    Past Surgical History:  Procedure Laterality Date  . ABDOMINAL HYSTERECTOMY  1996   TAHBSO  . APPENDECTOMY  1951  . CARDIAC CATHETERIZATION  April 14, 2012   nonobstructive CAD; severe LV dysfunction.   . COLON SURGERY  2006   Sigmoid colectomy for diverticulitis  . TONSILLECTOMY  1966     Current Outpatient Medications  Medication Sig Dispense Refill  . atorvastatin (LIPITOR) 20 MG tablet Take 1 tablet (20 mg total) by mouth daily. 30 tablet 11  . Ferrous Gluconate (IRON 27 PO) Take 1 tablet by mouth daily.    . furosemide (LASIX) 40 MG tablet Take 40 mg by mouth every  morning. Take 1/2 tablet by mouth in the evening    . KLOR-CON M10 10 MEQ tablet TAKE TWO TABLETS BY MOUTH ONCE DAILY 180 tablet 1  . loratadine (CLARITIN) 10 MG tablet Take 10 mg by mouth daily.      . metoprolol tartrate (LOPRESSOR) 100 MG tablet TAKE 1 TABLET BY MOUTH TWICE DAILY 180 tablet 2  . Multiple Vitamin (MULTIVITAMIN) tablet Take 1 tablet by [place] daily.      . Multiple Vitamins-Minerals (ICAPS PO) Take 2 tablets by mouth daily.     . warfarin (COUMADIN) 5 MG tablet TAKE AS DIRECTED BY ANTICOAGULATION CLINIC. 90 tablet 1   No current facility-administered medications for this visit.     Allergies:   Bactrim; Codeine; Contrast media [iodinated diagnostic agents]; Nitrofurantoin; Nitrofurantoin monohyd macro; Penicillins; and Sulfamethoxazole-trimethoprim    Social History:  The patient  reports that she has never smoked. She has never used smokeless tobacco. She reports that she does not drink alcohol or use drugs.   Family History:  The patient's family history includes Emphysema in her unknown relative; Heart disease in her father, sister, and unknown relative.    ROS:   Noted in current history, otherwise review of systems is negative.      Physical Exam: Blood pressure 100/70, pulse 76, height  (1.676 m), weight 156 lb (70.8 kg), SpO2 94  %.  GEN:  Elderly , frail female,  HEENT: Normal NECK: No JVD; No carotid bruits LYMPHATICS: No lymphadenopathy CARDIAC: irreg. Irreg.  RESPIRATORY:  Clear to auscultation without rales, wheezing or rhonchi  ABDOMEN: Soft, non-tender, non-distended MUSCULOSKELETAL:  No edema; No deformity  SKIN: Warm and dry NEUROLOGIC:  Alert and oriented x 3   EKG:     Recent Labs: 06/05/2016: ALT 18 01/15/2017: BUN 24; Creatinine, Ser 1.07; Potassium 3.9; Sodium 141    Lipid Panel    Component Value Date/Time   CHOL 168 06/05/2016 0911   TRIG 182.0 (H) 06/05/2016 0911   HDL 62.70 06/05/2016 0911   CHOLHDL 3 06/05/2016 0911   VLDL 36.4 06/05/2016 0911   LDLCALC 69 06/05/2016 0911   LDLDIRECT 142.0 12/16/2013 0857      Wt Readings from Last 3 Encounters:  06/04/17 156 lb (70.8 kg)  04/23/17 153 lb 9.6 oz (69.7 kg)  01/15/17 154 lb 9.6 oz (70.1 kg)      Other studies Reviewed: Additional studies/ records that were reviewed today include: . Review of the above records demonstrates:    ASSESSMENT AND PLAN:  1.  Chronic Atrial fibrillation-     Stable.  Chronic .   Continue coumadin   2.  Chronic systolic CHF - -EF around 20%.   She is not tolerating the spironolactone.   Will DC . Check BMP today  Will have her follow up with APP Sunday Spillers ) in 3 months and I'll see her in 6 months .   3. Hypertension-   BP is on the low side.   We are going to stop the Spironolactone ( which she thinks is causing her to have some chest pain )          Current medicines are reviewed at length with the patient today.  The patient does not have concerns regarding medicines.  The following changes have been made:  no change  Labs/ tests ordered today include:   Orders Placed This Encounter  Procedures  . Basic Metabolic Panel (BMET)  DC spironolactone    Disposition:  FU with [name] in 3 months.   She will see me in 6 months      Kristeen Miss, MD  06/04/2017 8:35 AM      [facility] [address] [place], [place], [place]  [address] Phone: [phone]; Fax: [phone]

## 2017-06-07 ENCOUNTER — Other Ambulatory Visit: Payer: Self-pay | Admitting: Cardiovascular Disease

## 2017-06-10 ENCOUNTER — Ambulatory Visit (INDEPENDENT_AMBULATORY_CARE_PROVIDER_SITE_OTHER): Payer: Medicare HMO | Admitting: General Practice

## 2017-06-10 DIAGNOSIS — I4891 Unspecified atrial fibrillation: Secondary | ICD-10-CM | POA: Diagnosis not present

## 2017-06-10 DIAGNOSIS — Z7901 Long term (current) use of anticoagulants: Secondary | ICD-10-CM

## 2017-06-10 LAB — POCT INR: INR: 1.7 — AB (ref 2.0–3.0)

## 2017-06-10 NOTE — Patient Instructions (Signed)
Pre visit review using our clinic review tool, if applicable. No additional management support is needed unless otherwise documented below in the visit note.  Take extra 1/2 tablet today and then change dosage and take 1 tablet daily except 1/2 tablet on Monday/Fridays.  Re-check in 4 weeks.

## 2017-06-21 ENCOUNTER — Other Ambulatory Visit: Payer: Self-pay | Admitting: General Practice

## 2017-06-21 ENCOUNTER — Other Ambulatory Visit: Payer: Self-pay | Admitting: Family Medicine

## 2017-06-21 MED ORDER — WARFARIN SODIUM 5 MG PO TABS: ORAL_TABLET | ORAL | 1 refills | Status: DC

## 2017-07-01 ENCOUNTER — Ambulatory Visit (INDEPENDENT_AMBULATORY_CARE_PROVIDER_SITE_OTHER): Payer: Medicare HMO | Admitting: General Practice

## 2017-07-01 DIAGNOSIS — Z7901 Long term (current) use of anticoagulants: Secondary | ICD-10-CM

## 2017-07-01 DIAGNOSIS — I4891 Unspecified atrial fibrillation: Secondary | ICD-10-CM | POA: Diagnosis not present

## 2017-07-01 LAB — POCT INR: INR: 2.6 (ref 2.0–3.0)

## 2017-07-01 NOTE — Patient Instructions (Addendum)
Pre visit review using our clinic review tool, if applicable. No additional management support is needed unless otherwise documented below in the visit note.  Continue to take 1 tablet daily except 1/2 tablet on Monday/Fridays.  Re-check in 4 weeks.    

## 2017-07-08 ENCOUNTER — Ambulatory Visit: Payer: Medicare HMO

## 2017-07-17 ENCOUNTER — Ambulatory Visit (INDEPENDENT_AMBULATORY_CARE_PROVIDER_SITE_OTHER): Payer: Medicare HMO | Admitting: Family Medicine

## 2017-07-17 VITALS — BP 120/80 | HR 92 | Wt 160.9 lb

## 2017-07-17 DIAGNOSIS — I10 Essential (primary) hypertension: Secondary | ICD-10-CM | POA: Diagnosis not present

## 2017-07-17 DIAGNOSIS — N183 Chronic kidney disease, stage 3 unspecified: Secondary | ICD-10-CM

## 2017-07-17 DIAGNOSIS — I5022 Chronic systolic (congestive) heart failure: Secondary | ICD-10-CM

## 2017-07-17 DIAGNOSIS — R600 Localized edema: Secondary | ICD-10-CM | POA: Diagnosis not present

## 2017-07-17 LAB — BRAIN NATRIURETIC PEPTIDE: PRO B NATRI PEPTIDE: 527 pg/mL — AB (ref 0.0–100.0)

## 2017-07-17 LAB — BASIC METABOLIC PANEL
BUN: 19 mg/dL (ref 6–23)
CO2: 29 mEq/L (ref 19–32)
Calcium: 9 mg/dL (ref 8.4–10.5)
Chloride: 102 mEq/L (ref 96–112)
Creatinine, Ser: 0.99 mg/dL (ref 0.40–1.20)
GFR: 56.16 mL/min — AB (ref >60.00)
Glucose, Bld: 95 mg/dL (ref 70–99)
POTASSIUM: 3.4 meq/L — AB (ref 3.5–5.1)
Sodium: 142 mEq/L (ref 135–145)

## 2017-07-17 NOTE — Progress Notes (Signed)
Subjective:     Patient ID: [name], female   DOB: [birth year], 84 y.o.   MRN: [id number]  HPI Patient has chronic problems including history of hypertension, atrial fibrillation, chronic systolic heart failure, GERD, polyneuropathy, chronic kidney disease, macular degeneration.   She is seen today with some increasing bilateral leg edema and increased shortness of breath over the past several weeks. She was seen by cardiology May 28. She had complained of chest pains which she felt was related to spirinolactone. She was taken off Spirinolactone. Her chest pain did improve but she's unfortunately had some increased shortness of breath since then. She's had some occasional cough which is nonproductive. No fevers or chills.  Patient had echocardiogram back in 2014 with ejection fraction 20%. Currently takes Lasix 40 mg in the morning and 20 mg in the early afternoon. She remains on metoprolol. She is on Coumadin.  Her weight had been around 156 pounds back late May and today 160.9  Past Medical History:  Diagnosis Date  . Atrial fib/flutter, transient   . B12 DEFICIENCY 11/25/2009  . CHF 01/27/2009  . Chronic systolic heart failure (HCC) 07/25/2009   a. Myoview 5/12:  Low risk, no ischemia, apical lateral defect probably breast attenuation, inf HK, EF 45%.  b. Echo 8/12: EF 45-50%, inf/inf-septal and apical HK, mild LVH, mild to mod MR, mild to mod TR, PASP 50;  c.  Echo 3/14:  Mild LVH, EF 20%, diff HK, mild to mod MR, mild LAE, mild to mod reduced RVSF, mild RAE, mild to mod TR, PASP 41  . DIVERTICULITIS OF COLON 01/27/2009  . ESSENTIAL HYPERTENSION 09/30/2009  . Hemorrhoids   . Hypertension   . Left ventricular dysfunction   . OSTEOARTHRITIS, KNEE, RIGHT 02/08/2009  . OSTEOPENIA 01/27/2009  . Rectal bleeding   . SMALL BOWEL OBSTRUCTION 05/05/2008   Past Surgical History:  Procedure Laterality Date  . ABDOMINAL HYSTERECTOMY  1996   TAHBSO  . APPENDECTOMY  1951  . CARDIAC  CATHETERIZATION  April 14, 2012   nonobstructive CAD; severe LV dysfunction.   . COLON SURGERY  2006   Sigmoid colectomy for diverticulitis  . TONSILLECTOMY  1966    reports that she has never smoked. She has never used smokeless tobacco. She reports that she does not drink alcohol or use drugs. family history includes Emphysema in her unknown relative; Heart disease in her father, sister, and unknown relative. Allergies  Allergen Reactions  . Bactrim Nausea Only and Other (See Comments)    headache  . Codeine Nausea Only  . Contrast Media [Iodinated Diagnostic Agents] Other (See Comments)    Pt unsure of reaction  . Nitrofurantoin Other (See Comments)    Pt unsure of reaction  . Nitrofurantoin Monohyd Macro Other (See Comments)    Pt unsure of reaction  . Penicillins Nausea And Vomiting  . Sulfamethoxazole-Trimethoprim Other (See Comments)    Pt unsure of reaction     Review of Systems  Constitutional: Positive for fatigue. Negative for appetite change and unexpected weight change.  Respiratory: Positive for cough and shortness of breath. Negative for wheezing.   Cardiovascular: Positive for leg swelling. Negative for chest pain and palpitations.  Neurological: Negative for dizziness and syncope.  Psychiatric/Behavioral: Negative for confusion.       Objective:   Physical Exam  Constitutional: She is oriented to person, place, and time. She appears well-developed and well-nourished.  Neck: Neck supple.  Cardiovascular: Normal rate.  Pulmonary/Chest: Effort normal and breath sounds normal.  She has no rales.  Musculoskeletal: She exhibits edema.  Patient has 1+ to 2+ pitting edema lower legs bilaterally  Neurological: She is alert and oriented to person, place, and time.       Assessment:     #1 progressive bilateral leg edema and some dyspnea suspect related to her systolic failure. Her weight is up about 5 pounds from late May after discontinuation of Spirinolactone.  She is in no respiratory distress currently. Pulse oximetry 94% which is near her baseline  #2 hypertension stable  #3 chronic atrial fibrillation on Coumadin    Plan:     -Increased furosemide to 40 mg the morning and 40 mg afternoon -Recheck basic metabolic panel and BNP level -Elevate legs frequently -Reassess in 5 days and sooner for any increasing shortness of breath or progressive edema -she knows to go to ER for any worsening dyspnea or edema.  Kristian CoveyBruce W Calyx Hawker MD [facility]

## 2017-07-17 NOTE — Patient Instructions (Signed)
Increase the Lasix (furosemide) to 40 mg one full tablet twice daily.  Elevate legs frequently   Let's plan follow up in 5 days.  Follow up sooner for any increasing shortness of breath.

## 2017-07-22 ENCOUNTER — Ambulatory Visit (INDEPENDENT_AMBULATORY_CARE_PROVIDER_SITE_OTHER): Payer: Medicare HMO | Admitting: Family Medicine

## 2017-07-22 ENCOUNTER — Encounter: Payer: Self-pay | Admitting: Family Medicine

## 2017-07-22 VITALS — BP 110/80 | HR 94 | Temp 99.0°F | Wt 158.6 lb

## 2017-07-22 DIAGNOSIS — I5022 Chronic systolic (congestive) heart failure: Secondary | ICD-10-CM

## 2017-07-22 DIAGNOSIS — R600 Localized edema: Secondary | ICD-10-CM

## 2017-07-22 NOTE — Progress Notes (Signed)
Subjective:     Patient ID: [name], female   DOB: [birth year], 84 y.o.   MRN: [id number]  HPI Agents seen for follow-up from visit last week on the 10th. She presented with increasing bilateral leg edema and some increased shortness of breath over several weeks. She had recently been taken off of Aldactone. We obtained lab work and potassium was slightly low 3.4 with BNP level of 527. We increased her Lasix to 40 mg twice daily. Weight is down a couple pounds today. She still has some shortness of breath and leg edema is slightly improved.  She has had a few loose stools over the past couple days. No fever. No abdominal pain. No nausea or vomiting. Her major complaint is some dyspnea. No chest pain.  Past Medical History:  Diagnosis Date  . Atrial fib/flutter, transient   . B12 DEFICIENCY 11/25/2009  . CHF 01/27/2009  . Chronic systolic heart failure (HCC) 07/25/2009   a. Myoview 5/12:  Low risk, no ischemia, apical lateral defect probably breast attenuation, inf HK, EF 45%.  b. Echo 8/12: EF 45-50%, inf/inf-septal and apical HK, mild LVH, mild to mod MR, mild to mod TR, PASP 50;  c.  Echo 3/14:  Mild LVH, EF 20%, diff HK, mild to mod MR, mild LAE, mild to mod reduced RVSF, mild RAE, mild to mod TR, PASP 41  . DIVERTICULITIS OF COLON 01/27/2009  . ESSENTIAL HYPERTENSION 09/30/2009  . Hemorrhoids   . Hypertension   . Left ventricular dysfunction   . OSTEOARTHRITIS, KNEE, RIGHT 02/08/2009  . OSTEOPENIA 01/27/2009  . Rectal bleeding   . SMALL BOWEL OBSTRUCTION 05/05/2008   Past Surgical History:  Procedure Laterality Date  . ABDOMINAL HYSTERECTOMY  1996   TAHBSO  . APPENDECTOMY  1951  . CARDIAC CATHETERIZATION  April 14, 2012   nonobstructive CAD; severe LV dysfunction.   . COLON SURGERY  2006   Sigmoid colectomy for diverticulitis  . TONSILLECTOMY  1966    reports that she has never smoked. She has never used smokeless tobacco. She reports that she does not drink alcohol or use  drugs. family history includes Emphysema in her unknown relative; Heart disease in her father, sister, and unknown relative. Allergies  Allergen Reactions  . Bactrim Nausea Only and Other (See Comments)    headache  . Codeine Nausea Only  . Contrast Media [Iodinated Diagnostic Agents] Other (See Comments)    Pt unsure of reaction  . Nitrofurantoin Other (See Comments)    Pt unsure of reaction  . Nitrofurantoin Monohyd Macro Other (See Comments)    Pt unsure of reaction  . Penicillins Nausea And Vomiting  . Sulfamethoxazole-Trimethoprim Other (See Comments)    Pt unsure of reaction       Review of Systems  Constitutional: Positive for fatigue.  Eyes: Negative for visual disturbance.  Respiratory: Positive for shortness of breath. Negative for cough, chest tightness and wheezing.   Cardiovascular: Positive for leg swelling. Negative for chest pain and palpitations.  Endocrine: Negative for polydipsia and polyuria.  Genitourinary: Negative for dysuria.  Neurological: Negative for dizziness, seizures, syncope, weakness, light-headedness and headaches.       Objective:   Physical Exam  Constitutional: She is oriented to person, place, and time. She appears well-developed and well-nourished.  Cardiovascular:  Irregular rhythm but rate controlled  Pulmonary/Chest: Effort normal and breath sounds normal.  Musculoskeletal: She exhibits edema.  Patient has pitting edema lower legs bilaterally but much improved compared to last week  Neurological: She is alert and oriented to person, place, and time.       Assessment:     Bilateral leg edema in a patient with known systolic dysfunction overall improved. She still has some shortness of breath but pulse oximetry stable and weight is down another couple pounds from last visit    Plan:     -Continue Lasix 40 mg twice daily -Elevate legs frequently -Information given on high potassium diet -Recheck in 2 weeks and recheck basic  metabolic panel then  Kristian CoveyBruce W Maripaz Mullan MD [facility]

## 2017-07-22 NOTE — Patient Instructions (Signed)

## 2017-07-24 ENCOUNTER — Ambulatory Visit: Payer: Medicare HMO | Admitting: Family Medicine

## 2017-08-05 ENCOUNTER — Ambulatory Visit (INDEPENDENT_AMBULATORY_CARE_PROVIDER_SITE_OTHER): Payer: Medicare HMO | Admitting: General Practice

## 2017-08-05 DIAGNOSIS — Z7901 Long term (current) use of anticoagulants: Secondary | ICD-10-CM

## 2017-08-05 DIAGNOSIS — I4891 Unspecified atrial fibrillation: Secondary | ICD-10-CM

## 2017-08-05 LAB — POCT INR: INR: 3.6 — AB (ref 2.0–3.0)

## 2017-08-05 NOTE — Patient Instructions (Addendum)
Pre visit review using our clinic review tool, if applicable. No additional management support is needed unless otherwise documented below in the visit note.  Hold coumadin today and then continue to take 1 tablet daily except 1/2 tablet on Monday/Fridays.  Re-check in 4 weeks.

## 2017-08-06 ENCOUNTER — Ambulatory Visit (INDEPENDENT_AMBULATORY_CARE_PROVIDER_SITE_OTHER): Payer: Medicare HMO | Admitting: Family Medicine

## 2017-08-06 ENCOUNTER — Encounter: Payer: Self-pay | Admitting: Family Medicine

## 2017-08-06 VITALS — BP 118/80 | HR 82 | Temp 98.1°F | Wt 156.8 lb

## 2017-08-06 DIAGNOSIS — K219 Gastro-esophageal reflux disease without esophagitis: Secondary | ICD-10-CM

## 2017-08-06 DIAGNOSIS — I5022 Chronic systolic (congestive) heart failure: Secondary | ICD-10-CM | POA: Diagnosis not present

## 2017-08-06 DIAGNOSIS — N183 Chronic kidney disease, stage 3 unspecified: Secondary | ICD-10-CM

## 2017-08-06 DIAGNOSIS — I10 Essential (primary) hypertension: Secondary | ICD-10-CM

## 2017-08-06 DIAGNOSIS — I4891 Unspecified atrial fibrillation: Secondary | ICD-10-CM | POA: Diagnosis not present

## 2017-08-06 LAB — BASIC METABOLIC PANEL
BUN: 25 mg/dL — AB (ref 6–23)
CALCIUM: 9.1 mg/dL (ref 8.4–10.5)
CO2: 34 mEq/L — ABNORMAL HIGH (ref 19–32)
Chloride: 99 mEq/L (ref 96–112)
Creatinine, Ser: 1.06 mg/dL (ref 0.40–1.20)
GFR: 51.9 mL/min — AB (ref >60.00)
GLUCOSE: 103 mg/dL — AB (ref 70–99)
Potassium: 3.5 mEq/L (ref 3.5–5.1)
SODIUM: 140 meq/L (ref 135–145)

## 2017-08-06 NOTE — Progress Notes (Signed)
Subjective:     Patient ID: [name], female   DOB: [birth year], 84 y.o.   MRN: [id number]  HPI Seen for follow-up regarding some recent increased peripheral edema and increased dyspnea with exertion. She has known chronic systolic heart failure. She was seen June 10 with increased weight and increasing edema lower extremities. She had recently been taken off Aldactone per cardiology secondary to some chest pains. Her edema increased afterwards.  She had BNP level of 527. Renal function stable. We increased Lasix to 40 mg twice daily. Her weight has declined from 160 pounds to current weight of 156.8. Her baseline weight seems to be around 153-154 pounds. She still has some dyspnea with exertion but edema has improved  She had some recent increased GERD symptoms. She tried Pepto-Bismol without improvement. No dysphagia. She's had some chronic balance issues likely related to her chronic peripheral neuropathy. Denies any recent falls.  She remains on Coumadin for atrial fibrillation. Followed through our Coumadin clinic. Medications reviewed. Compliant with all.  Past Medical History:  Diagnosis Date  . Atrial fib/flutter, transient   . B12 DEFICIENCY 11/25/2009  . CHF 01/27/2009  . Chronic systolic heart failure (HCC) 07/25/2009   a. Myoview 5/12:  Low risk, no ischemia, apical lateral defect probably breast attenuation, inf HK, EF 45%.  b. Echo 8/12: EF 45-50%, inf/inf-septal and apical HK, mild LVH, mild to mod MR, mild to mod TR, PASP 50;  c.  Echo 3/14:  Mild LVH, EF 20%, diff HK, mild to mod MR, mild LAE, mild to mod reduced RVSF, mild RAE, mild to mod TR, PASP 41  . DIVERTICULITIS OF COLON 01/27/2009  . ESSENTIAL HYPERTENSION 09/30/2009  . Hemorrhoids   . Hypertension   . Left ventricular dysfunction   . OSTEOARTHRITIS, KNEE, RIGHT 02/08/2009  . OSTEOPENIA 01/27/2009  . Rectal bleeding   . SMALL BOWEL OBSTRUCTION 05/05/2008   Past Surgical History:  Procedure Laterality Date  .  ABDOMINAL HYSTERECTOMY  1996   TAHBSO  . APPENDECTOMY  1951  . CARDIAC CATHETERIZATION  April 14, 2012   nonobstructive CAD; severe LV dysfunction.   . COLON SURGERY  2006   Sigmoid colectomy for diverticulitis  . TONSILLECTOMY  1966    reports that she has never smoked. She has never used smokeless tobacco. She reports that she does not drink alcohol or use drugs. family history includes Emphysema in her unknown relative; Heart disease in her father, sister, and unknown relative. Allergies  Allergen Reactions  . Bactrim Nausea Only and Other (See Comments)    headache  . Codeine Nausea Only  . Contrast Media [Iodinated Diagnostic Agents] Other (See Comments)    Pt unsure of reaction  . Nitrofurantoin Other (See Comments)    Pt unsure of reaction  . Nitrofurantoin Monohyd Macro Other (See Comments)    Pt unsure of reaction  . Penicillins Nausea And Vomiting  . Sulfamethoxazole-Trimethoprim Other (See Comments)    Pt unsure of reaction      Review of Systems  Constitutional: Negative for chills, fatigue, fever and unexpected weight change.  Eyes: Negative for visual disturbance.  Respiratory: Positive for shortness of breath. Negative for cough and wheezing.   Cardiovascular: Positive for leg swelling. Negative for chest pain and palpitations.  Gastrointestinal: Negative for abdominal pain.  Genitourinary: Negative for dysuria.  Neurological: Negative for dizziness, seizures, syncope, light-headedness and headaches.       Objective:   Physical Exam  Constitutional: She is oriented to person, place, and  time. She appears well-developed and well-nourished.  Cardiovascular:  Irregular rhythm with rate around 80  Pulmonary/Chest: Effort normal and breath sounds normal. She has no wheezes. She has no rales.  Musculoskeletal: She exhibits edema.  Still has trace pitting edema lower extremities bilaterally  Neurological: She is alert and oriented to person, place, and time.        Assessment:     #1 systolic heart failure which is chronic. Recent discontinuation of Aldactone. Her weight is decreasing and she's had some improvement in peripheral edema with increased Lasix dose. Still has some exertional dyspnea   #2 chronic atrial fibrillation rate controlled on Coumadin   #3 hypertension stable and at goal   #4 GERD     Plan:     -Continue Lasix 40 mg twice daily  -She has follow-up with cardiology in a few weeks. Would like to get their input regarding reviewing her heart failure medications.  -Consider over-the-counter Zantac or Pepcid as needed for GERD symptoms  -We've also suggested she get back to wearing her compression garments regularly  -Check basic metabolic panel   [name] CoveyBruce W Lacreshia Bondarenko MD [facility]

## 2017-08-06 NOTE — Patient Instructions (Signed)
Elevate legs frequently  Continue with the Furosemide 40 mg twice daily  Start back the compression hose- put on early in the morning.

## 2017-09-02 ENCOUNTER — Ambulatory Visit (INDEPENDENT_AMBULATORY_CARE_PROVIDER_SITE_OTHER): Payer: Medicare HMO | Admitting: General Practice

## 2017-09-02 DIAGNOSIS — Z7901 Long term (current) use of anticoagulants: Secondary | ICD-10-CM

## 2017-09-02 DIAGNOSIS — I4891 Unspecified atrial fibrillation: Secondary | ICD-10-CM | POA: Diagnosis not present

## 2017-09-02 LAB — POCT INR: INR: 2.8 (ref 2.0–3.0)

## 2017-09-02 NOTE — Patient Instructions (Addendum)
Pre visit review using our clinic review tool, if applicable. No additional management support is needed unless otherwise documented below in the visit note.  Continue to take 1 tablet daily except 1/2 tablet on Monday/Fridays.  Re-check in 4 weeks.

## 2017-09-04 ENCOUNTER — Encounter: Payer: Self-pay | Admitting: Nurse Practitioner

## 2017-09-04 ENCOUNTER — Ambulatory Visit: Payer: Medicare HMO | Admitting: Nurse Practitioner

## 2017-09-04 ENCOUNTER — Other Ambulatory Visit: Payer: Self-pay | Admitting: Nurse Practitioner

## 2017-09-04 VITALS — BP 120/80 | HR 72 | Ht 66.5 in | Wt 154.4 lb

## 2017-09-04 DIAGNOSIS — I10 Essential (primary) hypertension: Secondary | ICD-10-CM | POA: Diagnosis not present

## 2017-09-04 DIAGNOSIS — I482 Chronic atrial fibrillation, unspecified: Secondary | ICD-10-CM

## 2017-09-04 DIAGNOSIS — I5022 Chronic systolic (congestive) heart failure: Secondary | ICD-10-CM | POA: Diagnosis not present

## 2017-09-04 LAB — BASIC METABOLIC PANEL WITH GFR
BUN/Creatinine Ratio: 22 (ref 12–28)
BUN: 23 mg/dL (ref 8–27)
CO2: 25 mmol/L (ref 20–29)
Calcium: 9.1 mg/dL (ref 8.7–10.3)
Chloride: 100 mmol/L (ref 96–106)
Creatinine, Ser: 1.06 mg/dL — ABNORMAL HIGH (ref 0.57–1.00)
GFR calc Af Amer: 54 mL/min/1.73 — ABNORMAL LOW
GFR calc non Af Amer: 47 mL/min/1.73 — ABNORMAL LOW
Glucose: 116 mg/dL — ABNORMAL HIGH (ref 65–99)
Potassium: 3.4 mmol/L — ABNORMAL LOW (ref 3.5–5.2)
Sodium: 143 mmol/L (ref 134–144)

## 2017-09-04 LAB — CBC
Hematocrit: 40.1 % (ref 34.0–46.6)
Hemoglobin: 13.7 g/dL (ref 11.1–15.9)
MCH: 32.6 pg (ref 26.6–33.0)
MCHC: 34.2 g/dL (ref 31.5–35.7)
MCV: 96 fL (ref 79–97)
Platelets: 125 x10E3/uL — ABNORMAL LOW (ref 150–450)
RBC: 4.2 x10E6/uL (ref 3.77–5.28)
RDW: 13.7 % (ref 12.3–15.4)
WBC: 5.3 x10E3/uL (ref 3.4–10.8)

## 2017-09-04 NOTE — Patient Instructions (Addendum)
We will be checking the following labs today - BMET and CBC   Medication Instructions:    Continue with your current medicines.     Testing/Procedures To Be Arranged:  N/A  Follow-Up:   See Dr. Elease HashimotoNahser in 3 to 4 months    Other Special Instructions:   N/A    If you need a refill on your cardiac medications before your next appointment, please call your pharmacy.   Call the [facility] office at [phone] if you have any questions, problems or concerns.

## 2017-09-04 NOTE — Progress Notes (Signed)
CARDIOLOGY OFFICE NOTE  Date:  09/04/2017    [name] Date of Birth: 1926-05-13 Medical Record #[id number]  PCP:  [name], MD  Cardiologist:  Nahser    Chief Complaint  Patient presents with  . Congestive Heart Failure  . Atrial Fibrillation    3 month check - seen for Dr. Elease Hashimoto    History of Present Illness: Gwendolin Briel is a 84 y.o. female who presents today for a follow up visit. Seen for Dr. Elease Hashimoto.   She has a history of atrial fib with prior cardioversion dating back to 2012 with short lived results. Other issues include HTN, vertigo and chronic systolic HF with an EF of 20% by echo in 2014. She has non-obstructive CAD by cath from 2014. She is on chronic anticoagulation.   Last seen by Dr. Elease Hashimoto in May - was felt to be doing ok.   Comes in today. Here with her son. Somewhat rambling and tangential in her comments - this is chronic. She notes she has had some chest pain a few months ago - she assumes it is from her medicines - but she takes her medicines regardless. John recaps the history for me - he notes that every few months - she will have more shortness of breath along with some chest tightness - this is her baseline - it improves with continued diuresis. He feels that overall she is stable and has unchanged symptoms and does not require medication adjustments at this time. No bleeding. No apparent falls. Last INR ok. She says she is still going to the gym a few times a week.   Past Medical History:  Diagnosis Date  . Atrial fib/flutter, transient   . B12 DEFICIENCY 11/25/2009  . CHF 01/27/2009  . Chronic systolic heart failure (HCC) 07/25/2009   a. Myoview 5/12:  Low risk, no ischemia, apical lateral defect probably breast attenuation, inf HK, EF 45%.  b. Echo 8/12: EF 45-50%, inf/inf-septal and apical HK, mild LVH, mild to mod MR, mild to mod TR, PASP 50;  c.  Echo 3/14:  Mild LVH, EF 20%, diff HK, mild to mod MR, mild LAE, mild to mod  reduced RVSF, mild RAE, mild to mod TR, PASP 41  . DIVERTICULITIS OF COLON 01/27/2009  . ESSENTIAL HYPERTENSION 09/30/2009  . Hemorrhoids   . Hypertension   . Left ventricular dysfunction   . OSTEOARTHRITIS, KNEE, RIGHT 02/08/2009  . OSTEOPENIA 01/27/2009  . Rectal bleeding   . SMALL BOWEL OBSTRUCTION 05/05/2008    Past Surgical History:  Procedure Laterality Date  . ABDOMINAL HYSTERECTOMY  1996   TAHBSO  . APPENDECTOMY  1951  . CARDIAC CATHETERIZATION  April 14, 2012   nonobstructive CAD; severe LV dysfunction.   . COLON SURGERY  2006   Sigmoid colectomy for diverticulitis  . TONSILLECTOMY  1966     Medications: Current Meds  Medication Sig  . atorvastatin (LIPITOR) 20 MG tablet Take 1 tablet (20 mg total) by mouth daily.  . Ferrous Gluconate (IRON 27 PO) Take 1 tablet by mouth daily.  . furosemide (LASIX) 40 MG tablet TAKE 1 TABLET BY MOUTH IN THE MORNING AND 1/2 (ONE-HALF) IN THE EVENING (Patient taking differently: 40 mg 2 (two) times daily. )  . guaifenesin (HUMIBID E) 400 MG TABS tablet Take 400 mg by mouth every 4 (four) hours.  . hydrocortisone cream 0.5 % Apply 1 application topically 2 (two) times daily.  Marland Kitchen KLOR-CON M10 10 MEQ tablet TAKE TWO  TABLETS BY MOUTH ONCE DAILY  . loratadine (CLARITIN) 10 MG tablet Take 10 mg by mouth daily.    . metoprolol tartrate (LOPRESSOR) 100 MG tablet TAKE 1 TABLET BY MOUTH TWICE DAILY  . Multiple Vitamin (MULTIVITAMIN) tablet Take 1 tablet by mouth daily.    . Multiple Vitamins-Minerals (ICAPS PO) Take 2 tablets by mouth daily.   Marland Kitchen. warfarin (COUMADIN) 5 MG tablet TAKE AS DIRECTED BY ANTICOAGULATION CLINIC.     Allergies: Allergies  Allergen Reactions  . Bactrim Nausea Only and Other (See Comments)    headache  . Codeine Nausea Only  . Contrast Media [Iodinated Diagnostic Agents] Other (See Comments)    Pt unsure of reaction  . Nitrofurantoin Other (See Comments)    Pt unsure of reaction  . Nitrofurantoin Monohyd Macro Other (See  Comments)    Pt unsure of reaction  . Penicillins Nausea And Vomiting  . Sulfamethoxazole-Trimethoprim Other (See Comments)    Pt unsure of reaction    Social History: The patient  reports that she has never smoked. She has never used smokeless tobacco. She reports that she does not drink alcohol or use drugs.   Family History: The patient's family history includes Emphysema in her unknown relative; Heart disease in her father, sister, and unknown relative.   Review of Systems: Please see the history of present illness.   Otherwise, the review of systems is positive for none.   All other systems are reviewed and negative.   Physical Exam: VS:  BP 120/80 (BP Location: Left Arm, Patient Position: Sitting, Cuff Size: Normal)   Pulse 72   Ht 5' 6.5" (1.689 m)   Wt 154 lb 6.4 oz (70 kg)   BMI 24.55 kg/m  .  BMI Body mass index is 24.55 kg/m.  Wt Readings from Last 3 Encounters:  09/04/17 154 lb 6.4 oz (70 kg)  08/06/17 156 lb 12.8 oz (71.1 kg)  07/22/17 158 lb 9.6 oz (71.9 kg)    General: Pleasant. Elderly. Alert and in no acute distress.   HEENT: Normal.  Neck: Supple, no JVD, carotid bruits, or masses noted.  Cardiac: Irregular irregular rhythm. Her rate is ok. Trace edema.  Respiratory:  Lungs are clear to auscultation bilaterally with normal work of breathing.  GI: Soft and nontender.  MS: No deformity or atrophy. Gait and ROM intact.  Skin: Warm and dry. Color is normal.  Neuro:  Strength and sensation are intact and no gross focal deficits noted.  Psych: Alert, appropriate and with normal affect.   LABORATORY DATA:  EKG:  EKG is not ordered today.  Lab Results  Component Value Date   WBC 6.5 02/01/2016   HGB 13.1 02/01/2016   HCT 38.9 02/01/2016   PLT 219.0 02/01/2016   GLUCOSE 103 (H) 08/06/2017   CHOL 168 06/05/2016   TRIG 182.0 (H) 06/05/2016   HDL 62.70 06/05/2016   LDLDIRECT 142.0 12/16/2013   LDLCALC 69 06/05/2016   ALT 18 06/05/2016   AST 24  06/05/2016   NA 140 08/06/2017   K 3.5 08/06/2017   CL 99 08/06/2017   CREATININE 1.06 08/06/2017   BUN 25 (H) 08/06/2017   CO2 34 (H) 08/06/2017   TSH 3.93 02/01/2016   INR 2.8 09/02/2017   HGBA1C 5.8 08/06/2016   Lab Results  Component Value Date   INR 2.8 09/02/2017   INR 3.6 (A) 08/05/2017   INR 2.6 07/01/2017     BNP (last 3 results) No results for input(s): BNP in  the last 8760 hours.  ProBNP (last 3 results) Recent Labs    07/17/17 0908  PROBNP 527.0*     Other Studies Reviewed Today:  Echo Study Conclusions 2014  - Left ventricle: The cavity size was mildly dilated. Wall thickness was increased in a pattern of mild LVH. The estimated ejection fraction was 20%. Diffuse hypokinesis. - Mitral valve: Mild to moderate regurgitation. - Left atrium: The atrium was mildly dilated. - Right ventricle: Systolic function was mildly to moderately reduced. - Right atrium: The atrium was mildly dilated. - Tricuspid valve: Mild-moderate regurgitation. - Pulmonary arteries: PA peak pressure: 41mm Hg (S).   Coronary angiography 04/2012: Coronary dominance: co- dominant  Left mainstem: Normal  Left anterior descending (LAD): 30-40% mid vessel. First diagonal is normal.  Left circumflex (LCx): Large codominant vessel. Focal 40-50% after OM2.   Right coronary artery (RCA): 30% mid vessel.  Left ventriculography: Left ventricular systolic function is abnormal. There is inferior wall akinesis with global hypokinesis. Overall EF estimated at 30%.  Final Conclusions:   1. Nonobstructive coronary artery disease. 2. Severe LV dysfunction. 3. Normal right heart pressures.  Recommendations: Continue medical management.  Theron Arista [facility] 04/14/2012, 10:19 AM  Assessment/Plan:  1. Chronic systolic HF - managed with diuretics. Her symptoms appear to be stable and at her baseline per the son. No changes are made. I did advise that if she has progression of  her symptoms we could increase her diuretics.   2. Nonobstructive CAD - managed medically.   3. HTN - BP is ok on her current regimen. No changes made.   4. Chronic anticoagulation with coumadin - no adverse effects noted. Needs lab today.   5. HLD - on statin - followed by PCP  Current medicines are reviewed with the patient today.  The patient does not have concerns regarding medicines other than what has been noted above.  The following changes have been made:  See above.  Labs/ tests ordered today include:    Orders Placed This Encounter  Procedures  . Basic metabolic panel  . CBC  . EKG 12-Lead     Disposition:   FU with Dr. Elease Hashimoto in about 3 to 4 months.    Patient is agreeable to this plan and will call if any problems develop in the interim.   SignedNorma Fredrickson, NP  09/04/2017 8:33 AM  [facility] [address] [place], [place]  [address] Phone: [phone] Fax: [phone]

## 2017-09-06 ENCOUNTER — Telehealth: Payer: Self-pay | Admitting: Cardiovascular Disease

## 2017-09-06 ENCOUNTER — Telehealth: Payer: Self-pay | Admitting: Nurse Practitioner

## 2017-09-06 ENCOUNTER — Other Ambulatory Visit: Payer: Self-pay | Admitting: *Deleted

## 2017-09-06 DIAGNOSIS — E876 Hypokalemia: Secondary | ICD-10-CM

## 2017-09-06 MED ORDER — POTASSIUM CHLORIDE CRYS ER 10 MEQ PO TBCR: 30.0000 meq | EXTENDED_RELEASE_TABLET | Freq: Every day | ORAL | 1 refills | Status: DC

## 2017-09-06 NOTE — Telephone Encounter (Signed)
Pt's pharmacy [facility] requesting a clarification on potassium chloride 10 Meq. Pharmacy stated that this medication was flagged a possible interaction to spironolactone. Is it ok to refill? Pharmacy would like a call back concerning this matter. Please address

## 2017-09-06 NOTE — Telephone Encounter (Signed)
Follow Up ° °Pt returning call for nurse °

## 2017-09-06 NOTE — Telephone Encounter (Signed)
Called pt's pharmacy to informed them that pt does not take spironolactone, this medication was D/C by provider and that it is ok to refill pt's potassium chloride. Pharmacy stated that they do not get D/C notices about medications being D/C.

## 2017-09-30 ENCOUNTER — Ambulatory Visit (INDEPENDENT_AMBULATORY_CARE_PROVIDER_SITE_OTHER): Payer: Medicare HMO | Admitting: General Practice

## 2017-09-30 DIAGNOSIS — I4891 Unspecified atrial fibrillation: Secondary | ICD-10-CM

## 2017-09-30 DIAGNOSIS — Z7901 Long term (current) use of anticoagulants: Secondary | ICD-10-CM

## 2017-09-30 LAB — POCT INR: INR: 3.4 — AB (ref 2.0–3.0)

## 2017-09-30 NOTE — Patient Instructions (Addendum)
Pre visit review using our clinic review tool, if applicable. No additional management support is needed unless otherwise documented below in the visit note.  Continue to take 1 tablet daily except 1/2 tablet on Monday/Fridays.  Re-check in 3 weeks.

## 2017-10-04 ENCOUNTER — Ambulatory Visit (INDEPENDENT_AMBULATORY_CARE_PROVIDER_SITE_OTHER): Payer: Medicare HMO | Admitting: Family Medicine

## 2017-10-04 ENCOUNTER — Other Ambulatory Visit: Payer: Self-pay

## 2017-10-04 ENCOUNTER — Encounter: Payer: Self-pay | Admitting: Family Medicine

## 2017-10-04 VITALS — BP 118/72 | HR 72 | Temp 97.8°F | Ht 66.5 in | Wt 157.4 lb

## 2017-10-04 DIAGNOSIS — E876 Hypokalemia: Secondary | ICD-10-CM

## 2017-10-04 DIAGNOSIS — I5022 Chronic systolic (congestive) heart failure: Secondary | ICD-10-CM | POA: Diagnosis not present

## 2017-10-04 DIAGNOSIS — E785 Hyperlipidemia, unspecified: Secondary | ICD-10-CM | POA: Diagnosis not present

## 2017-10-04 DIAGNOSIS — I10 Essential (primary) hypertension: Secondary | ICD-10-CM

## 2017-10-04 DIAGNOSIS — I4891 Unspecified atrial fibrillation: Secondary | ICD-10-CM

## 2017-10-04 LAB — LIPID PANEL
CHOLESTEROL: 138 mg/dL (ref 0–200)
HDL: 53.5 mg/dL (ref >39.00)
LDL CALC: 53 mg/dL (ref 0–99)
NonHDL: 84.05
Total CHOL/HDL Ratio: 3
Triglycerides: 154 mg/dL — ABNORMAL HIGH (ref 0.0–149.0)
VLDL: 30.8 mg/dL (ref 0.0–40.0)

## 2017-10-04 LAB — HEPATIC FUNCTION PANEL
ALBUMIN: 4 g/dL (ref 3.5–5.2)
ALT: 17 U/L (ref 0–35)
AST: 22 U/L (ref 0–37)
Alkaline Phosphatase: 96 U/L (ref 39–117)
BILIRUBIN TOTAL: 1.2 mg/dL (ref 0.2–1.2)
Bilirubin, Direct: 0.3 mg/dL (ref 0.0–0.3)
Total Protein: 6.4 g/dL (ref 6.0–8.3)

## 2017-10-04 MED ORDER — POTASSIUM CHLORIDE CRYS ER 10 MEQ PO TBCR: 30.0000 meq | EXTENDED_RELEASE_TABLET | Freq: Every day | ORAL | 1 refills | Status: DC

## 2017-10-04 NOTE — Progress Notes (Signed)
Subjective:     Patient ID: [name], female   DOB: Sep 13, 1926, 84 y.o.   MRN: [id number]  HPI Patient here for medical follow-up.  She is a poor historian.  She has chronic problems including atrial fibrillation, chronic Coumadin therapy, GERD, hyperlipidemia, chronic systolic heart failure, chronic kidney disease.  She recently saw cardiology.  No change in medication made.  Potassium 3.4.  Patient apparently had misunderstanding and thought that she was taken off potassium supplement.  She states her weight is stable by home readings run 153 pounds.  She still has some intermittent dyspnea with activities.  Denies chest pain.  Coumadin slightly over replaced recently with INR 3.4.  She is followed closely through Coumadin clinic.  No bleeding complications.  She said some recent heartburn symptoms which are improved with Tums.  She has ongoing intermittent neuropathy pains left lower extremity.  She declines any further medications.  Hyperlipidemia treated with Lipitor.  She is overdue for lipid panel.  She had flu vaccine from pharmacy September 9  Past Medical History:  Diagnosis Date  . Atrial fib/flutter, transient   . B12 DEFICIENCY 11/25/2009  . CHF 01/27/2009  . Chronic systolic heart failure (HCC) 07/25/2009   a. Myoview 5/12:  Low risk, no ischemia, apical lateral defect probably breast attenuation, inf HK, EF 45%.  b. Echo 8/12: EF 45-50%, inf/inf-septal and apical HK, mild LVH, mild to mod MR, mild to mod TR, PASP 50;  c.  Echo 3/14:  Mild LVH, EF 20%, diff HK, mild to mod MR, mild LAE, mild to mod reduced RVSF, mild RAE, mild to mod TR, PASP 41  . DIVERTICULITIS OF COLON 01/27/2009  . ESSENTIAL HYPERTENSION 09/30/2009  . Hemorrhoids   . Hypertension   . Left ventricular dysfunction   . OSTEOARTHRITIS, KNEE, RIGHT 02/08/2009  . OSTEOPENIA 01/27/2009  . Rectal bleeding   . SMALL BOWEL OBSTRUCTION 05/05/2008   Past Surgical History:  Procedure Laterality Date  . ABDOMINAL  HYSTERECTOMY  1996   TAHBSO  . APPENDECTOMY  1951  . CARDIAC CATHETERIZATION  April 14, 2012   nonobstructive CAD; severe LV dysfunction.   . COLON SURGERY  2006   Sigmoid colectomy for diverticulitis  . TONSILLECTOMY  1966    reports that she has never smoked. She has never used smokeless tobacco. She reports that she does not drink alcohol or use drugs. family history includes Emphysema in her unknown relative; Heart disease in her father, sister, and unknown relative. Allergies  Allergen Reactions  . Bactrim Nausea Only and Other (See Comments)    headache  . Codeine Nausea Only  . Contrast Media [Iodinated Diagnostic Agents] Other (See Comments)    Pt unsure of reaction  . Nitrofurantoin Other (See Comments)    Pt unsure of reaction  . Nitrofurantoin Monohyd Macro Other (See Comments)    Pt unsure of reaction  . Penicillins Nausea And Vomiting  . Sulfamethoxazole-Trimethoprim Other (See Comments)    Pt unsure of reaction     Review of Systems  Constitutional: Negative for fatigue and unexpected weight change.  Eyes: Negative for visual disturbance.  Respiratory: Positive for shortness of breath. Negative for cough, chest tightness and wheezing.   Cardiovascular: Positive for leg swelling. Negative for chest pain and palpitations.  Gastrointestinal: Negative for abdominal pain, nausea and vomiting.  Genitourinary: Negative for dysuria.  Neurological: Negative for dizziness, seizures, syncope, weakness, light-headedness and headaches.  Psychiatric/Behavioral: Negative for confusion.       Objective:  Physical Exam  Constitutional: She appears well-developed and well-nourished.  Eyes: Pupils are equal, round, and reactive to light.  Neck: Neck supple. No JVD present. No thyromegaly present.  Cardiovascular: Normal rate. Exam reveals no gallop.  irregular rhythm  Pulmonary/Chest: Effort normal and breath sounds normal. No respiratory distress. She has no wheezes. She  has no rales.  Musculoskeletal: She exhibits edema.  She has trace pitting edema lower legs feet and ankles bilaterally  Neurological: She is alert.       Assessment:     #1 chronic systolic heart failure-appears to be stable at this time.  She has some chronic dyspnea with exertion which is unchanged  #2 hypertension stable and at goal  #3 chronic kidney disease  #4 history of atrial fibrillation on metoprolol and chronic anticoagulation with Coumadin  #5 hyperlipidemia.  Overdue for follow-up lipids  #6 recent mild hypokalemia by labs per cardiology with potassium 3.4    Plan:     -Check labs today with basic metabolic panel, lipid panel, hepatic panel -Discussed options for management of neuropathy pain.  She declines further medication.  We would not want to use medication such as gabapentin or Lyrica because of potential sedation and increased risk for edema. -Continue frequent lower extremity elevation and continue daily weights -She will continue close follow-up with Coumadin clinic -She had flu vaccine as above -Routine follow-up in 3 months and sooner as needed -We reviewed her medications at length.  She is aware that she needs to be back on potassium supplement and refills given  [name] MD [facility] at [facility]

## 2017-10-04 NOTE — Patient Instructions (Signed)
Continue to elevate legs frequently  Continue with daily weights and let me know if weight goes up more than 3 pounds in one week.

## 2017-10-21 ENCOUNTER — Ambulatory Visit (INDEPENDENT_AMBULATORY_CARE_PROVIDER_SITE_OTHER): Payer: Medicare HMO | Admitting: General Practice

## 2017-10-21 DIAGNOSIS — I4891 Unspecified atrial fibrillation: Secondary | ICD-10-CM | POA: Diagnosis not present

## 2017-10-21 DIAGNOSIS — Z7901 Long term (current) use of anticoagulants: Secondary | ICD-10-CM

## 2017-10-21 LAB — POCT INR: INR: 1.8 — AB (ref 2.0–3.0)

## 2017-10-21 NOTE — Patient Instructions (Signed)
Pre visit review using our clinic review tool, if applicable. No additional management support is needed unless otherwise documented below in the visit note.  Take 1 tablet today (10/14) and then continue to take 1 tablet daily except 1/2 tablet on Monday/Fridays.  Re-check in 4 weeks.

## 2017-11-01 ENCOUNTER — Other Ambulatory Visit: Payer: Self-pay | Admitting: Cardiovascular Disease

## 2017-11-18 ENCOUNTER — Ambulatory Visit (INDEPENDENT_AMBULATORY_CARE_PROVIDER_SITE_OTHER): Payer: Medicare HMO | Admitting: General Practice

## 2017-11-18 DIAGNOSIS — Z7901 Long term (current) use of anticoagulants: Secondary | ICD-10-CM

## 2017-11-18 DIAGNOSIS — I4891 Unspecified atrial fibrillation: Secondary | ICD-10-CM | POA: Diagnosis not present

## 2017-11-18 LAB — POCT INR: INR: 3.6 — AB (ref 2.0–3.0)

## 2017-11-18 NOTE — Patient Instructions (Addendum)
Pre visit review using our clinic review tool, if applicable. No additional management support is needed unless otherwise documented below in the visit note.  Hold dosage today(11/11) and then continue to take 1 tablet daily except 1/2 tablet on Monday/Fridays.  Re-check in 4 weeks.

## 2017-12-16 ENCOUNTER — Ambulatory Visit (INDEPENDENT_AMBULATORY_CARE_PROVIDER_SITE_OTHER): Payer: Medicare HMO | Admitting: General Practice

## 2017-12-16 DIAGNOSIS — Z7901 Long term (current) use of anticoagulants: Secondary | ICD-10-CM

## 2017-12-16 DIAGNOSIS — I4891 Unspecified atrial fibrillation: Secondary | ICD-10-CM

## 2017-12-16 LAB — POCT INR: INR: 3.4 — AB (ref 2.0–3.0)

## 2017-12-16 NOTE — Patient Instructions (Signed)
Pre visit review using our clinic review tool, if applicable. No additional management support is needed unless otherwise documented below in the visit note.  Hold dosage today(12/9) and then change dosage and take 1 tablet daily except 1/2 tablet on Monday/Wed/Fridays.  Re-check in 3 weeks.

## 2017-12-24 ENCOUNTER — Encounter: Payer: Self-pay | Admitting: Cardiovascular Disease

## 2017-12-24 ENCOUNTER — Ambulatory Visit: Payer: Medicare HMO | Admitting: Cardiovascular Disease

## 2017-12-24 VITALS — BP 126/76 | HR 69 | Ht 66.5 in | Wt 162.8 lb

## 2017-12-24 DIAGNOSIS — I4891 Unspecified atrial fibrillation: Secondary | ICD-10-CM

## 2017-12-24 DIAGNOSIS — I5022 Chronic systolic (congestive) heart failure: Secondary | ICD-10-CM

## 2017-12-24 NOTE — Progress Notes (Signed)
Cardiology Office Note   Date:  12/24/2017   ID:  [name], DOB [birth year], MRN [id number]  PCP:  [name], MD  Cardiologist:   Kristeen Miss, MD   Chief Complaint  Patient presents with  . Hypertension  . Atrial Fibrillation   1. Atrial fibrillation 2. Hypertension 3. Benign Positional Vertigo 4. Chronic systolic CHF -  Left ventricle: The cavity size was mildly dilated. Wall thickness was increased in a pattern of mild LVH. The estimated ejection fraction was 20%. Diffuse hypokinesis. - Mitral valve: Mild to moderate regurgitation. - Left atrium: The atrium was mildly dilated. - Right ventricle: Systolic function was mildly to moderately reduced. - Right atrium: The atrium was mildly dilated. - Tricuspid valve: Mild-moderate regurgitation. - Pulmonary arteries: PA peak pressure: 41mm Hg  History of Present Illness:  [name] is a 84 y.o. female with a history of atrial fibrillation. She was cardioverted in October, 2012. She unfortunately has gone back into atrial fibrillation.  She has felt very well. She's not having any episodes of chest pain or shortness breath. She's tolerating the rhythm quite well. Her only complaint is that she's itching from the patches were placed during the cardioversion. She also complains of having very dry and cracking skin on her hands.   Sept. 16, 2013 She has been exercising regularly. She has gained weight since I last saw her ( 11 lbs.) She has no complaints of dyspnea or chest pain.  March 21, 2012:  She was seen by Lorin Picket several weeks ago. She was scheduled have an echocardiogram but she has not had that yet. ( was scheduled but we had a big snow storm). She's been having more shortness breath and more leg edema. Scar decreased her Lasix. Her leg swelling is better. He also has some chest heaviness. She is trying to limit her stair climbing.  April 04, 2012:  Her recent echo shows markedly decreased LV  function   Left ventricle: The cavity size was mildly dilated. Wall thickness was increased in a pattern of mild LVH. The estimated ejection fraction was 20%. Diffuse hypokinesis. - Mitral valve: Mild to moderate regurgitation. - Left atrium: The atrium was mildly dilated. - Right ventricle: Systolic function was mildly to moderately reduced. - Right atrium: The atrium was mildly dilated. - Tricuspid valve: Mild-moderate regurgitation. - Pulmonary arteries: PA peak pressure: 41mm Hg (S).  her myoivew study revealed an apical defect - ? Apical thinning. Low risk study.  She continues to have shortness breath. Not sure that she's having any chest pain. It's very difficult to get an accurate history of her. She seems to ramble in her conversation.  July 02, 2012:  Flow had a cardiac cath since of last year. She developed a rash after the cath She has nonobstructive coronary artery disease. Her ejection fraction is severely depressed with an EF of 20-25%. She continues to have atrial fibrillation with a well-controlled ventricular response. She is on chronic Coumadin therapy.  She goes to the [place] twice a week. She walks to [employer] ([place] to [place],- about 1 mile) She stays very active. She complains of being short of breath but she remains very active.   Dec. 22, 2014:  [name] continues to have more dyspnea. She added an extra Lasix ( now 4 times a day). She does not think that she's breathing any better since increasing her Lasix from 3 times a day to 4 times a day. She still walks on a regular basis  except very bad weather.   04/10/2013:  [name] has done well. She had a brief episode of sharp CP - lasted 15 seconds. She has not been going to the gym. She is exercising in the AM to a CD. Able to workout without any CP or dyspnea.   Oct. 5, 2015:  [name] is seen back for follow up visit. Short of breath - about the same.  Sprained her ankle - had a cortisone  injection Had a birthday recently.  May 24, 2014:  [name] is a 84 y.o. female who presents for her atrial fib  Nov. 15, 2016:  Complains that the metoprolol is thinning her hair.   Breathing is ok Goes to the gym,  Still gets out of breath  Has plantar fascitis.  July 07, 2015:  [name] is doing well.  Occasional blood in her stool  Still being bothered with plantar fascitis  BP is well controlled . INR is theraputic . Tolerating meds fairly well.   Dec. 12, 2017: 84 y.o. female with hx of CHF  Breathing is ok.  Some DOE when she climbs up 13 steps ( at her sons home )   Sept. 4, 2018:  Doing well from a cardiac standpoint.   Has some DOE climbing 13 steps .  No CP  Has some gas pains.  Has some dizziness,   BP looks great   Jun 04, 2017  [name] FriedlanderFlorence is seen today for follow-up visit.  She has chronic atrial fibrillation.   She is on chronic Coumadin therapy.  She is been having some chest pain that she thinks is caused by the Aldactone.  She has a history of hypertension.  Her blood pressure has been very well controlled.   She has CP while at rest.   Typically watching TV.   CP occurs about 1-2 hours after she takes the [place].   Still goes to the gym 3 days a week .   Dec. 17, 2019:  Still has some shortness of breath Also has some chest pain / tightness  Had a normal myoview in 2012  Walks a mile - has to stop 4 times during the walk  Is able to go up a hill    Past Medical History:  Diagnosis Date  . Atrial fib/flutter, transient   . B12 DEFICIENCY 11/25/2009  . CHF 01/27/2009  . Chronic systolic heart failure (HCC) 07/25/2009   a. Myoview 5/12:  Low risk, no ischemia, apical lateral defect probably breast attenuation, inf HK, EF 45%.  b. Echo 8/12: EF 45-50%, inf/inf-septal and apical HK, mild LVH, mild to mod MR, mild to mod TR, PASP 50;  c.  Echo 3/14:  Mild LVH, EF 20%, diff HK, mild to mod MR, mild LAE, mild to mod reduced RVSF, mild RAE, mild to mod  TR, PASP 41  . DIVERTICULITIS OF COLON 01/27/2009  . ESSENTIAL HYPERTENSION 09/30/2009  . Hemorrhoids   . Hypertension   . Left ventricular dysfunction   . OSTEOARTHRITIS, KNEE, RIGHT 02/08/2009  . OSTEOPENIA 01/27/2009  . Rectal bleeding   . SMALL BOWEL OBSTRUCTION 05/05/2008    Past Surgical History:  Procedure Laterality Date  . ABDOMINAL HYSTERECTOMY  1996   TAHBSO  . APPENDECTOMY  1951  . CARDIAC CATHETERIZATION  April 14, 2012   nonobstructive CAD; severe LV dysfunction.   . COLON SURGERY  2006   Sigmoid colectomy for diverticulitis  . TONSILLECTOMY  1966     Current Outpatient Medications  Medication  Sig Dispense Refill  . atorvastatin (LIPITOR) 20 MG tablet Take 1 tablet (20 mg total) by mouth daily. 30 tablet 11  . Ferrous Gluconate (IRON 27 PO) Take 1 tablet by mouth daily.    . furosemide (LASIX) 40 MG tablet TAKE 1 TABLET BY MOUTH IN THE MORNING AND 1/2 (ONE-HALF) IN THE EVENING (Patient taking differently: 40 mg 2 (two) times daily. ) 135 tablet 3  . hydrocortisone cream 0.5 % Apply 1 application topically 2 (two) times daily.    Marland Kitchen loratadine (CLARITIN) 10 MG tablet Take 10 mg by mouth daily.      . metoprolol tartrate (LOPRESSOR) 100 MG tablet TAKE 1 TABLET BY MOUTH TWICE DAILY 180 tablet 2  . Multiple Vitamin (MULTIVITAMIN) tablet Take 1 tablet by mouth daily.      . potassium chloride (KLOR-CON M10) 10 MEQ tablet Take 3 tablets (30 mEq total) by mouth daily. 180 tablet 1  . warfarin (COUMADIN) 5 MG tablet TAKE AS DIRECTED BY ANTICOAGULATION CLINIC. 90 tablet 1   No current facility-administered medications for this visit.     Allergies:   Bactrim; Codeine; Contrast media [iodinated diagnostic agents]; Nitrofurantoin; Nitrofurantoin monohyd macro; Penicillins; and Sulfamethoxazole-trimethoprim    Social History:  The patient  reports that she has never smoked. She has never used smokeless tobacco. She reports that she does not drink alcohol or use drugs.   Family  History:  The patient's family history includes Emphysema in her unknown relative; Heart disease in her father, sister, and unknown relative.    ROS:   Noted in current history, otherwise review of systems is negative.    Physical Exam: Blood pressure 126/76, pulse 69, height 5' 6.5" (1.689 m), weight 162 lb 12.8 oz (73.8 kg), SpO2 96 %.  GEN: Elderly female, no acute distress.  Does not appear to be her stated age. HEENT: Normal NECK: No JVD; No carotid bruits LYMPHATICS: No lymphadenopathy CARDIAC: Irregularly irregular.  Soft systolic murmur RESPIRATORY:  Clear to auscultation without rales, wheezing or rhonchi  ABDOMEN: Soft, non-tender, non-distended MUSCULOSKELETAL:  No edema; No deformity  SKIN: Warm and dry NEUROLOGIC:  Alert and oriented x 3    EKG:     Recent Labs: 07/17/2017: Pro B Natriuretic peptide (BNP) 527.0 09/04/2017: BUN 23; Creatinine, Ser 1.06; Hemoglobin 13.7; Platelets 125; Potassium 3.4; Sodium 143 10/04/2017: ALT 17    Lipid Panel    Component Value Date/Time   CHOL 138 10/04/2017 0813   TRIG 154.0 (H) 10/04/2017 0813   HDL 53.50 10/04/2017 0813   CHOLHDL 3 10/04/2017 0813   VLDL 30.8 10/04/2017 0813   LDLCALC 53 10/04/2017 0813   LDLDIRECT 142.0 12/16/2013 0857      Wt Readings from Last 3 Encounters:  12/24/17 162 lb 12.8 oz (73.8 kg)  10/04/17 157 lb 6.4 oz (71.4 kg)  09/04/17 154 lb 6.4 oz (70 kg)      Other studies Reviewed: Additional studies/ records that were reviewed today include: . Review of the above records demonstrates:    ASSESSMENT AND PLAN:  1.  Chronic Atrial fibrillation-     A. fib rate is very stable.  Continue Coumadin.  2.  Chronic systolic CHF - -EF around 20%.   Overall I think she is doing very well.  Dr. Caryl Never has adjusted her Lasix dose.  It is clear that she still eats a lot of salt.  She eats canned foods all the time.  3. Hypertension-    Is well controlled at this  point.        Current  medicines are reviewed at length with the patient today.  The patient does not have concerns regarding medicines.  The following changes have been made:  no change  Labs/ tests ordered today include:   No orders of the defined types were placed in this encounter. DC spironolactone    Disposition:  Follow up in 6 months     Kristeen Miss, MD  12/24/2017 8:28 AM    [facility] [address] [place], [place], [place]  [address] Phone: [phone]; Fax: [phone]

## 2017-12-24 NOTE — Patient Instructions (Signed)
Medication Instructions:  No changes If you need a refill on your cardiac medications before your next appointment, please call your pharmacy.   Lab work: none If you have labs (blood work) drawn today and your tests are completely normal, you will receive your results only by: . MyChart Message (if you have MyChart) OR . A paper copy in the mail If you have any lab test that is abnormal or we need to change your treatment, we will call you to review the results.  Testing/Procedures: none  Follow-Up: At [facility], you and your health needs are our priority.  As part of our continuing mission to provide you with exceptional heart care, we have created designated Provider Care Teams.  These Care Teams include your primary Cardiologist (physician) and Advanced Practice Providers (APPs -  Physician Assistants and Nurse Practitioners) who all work together to provide you with the care you need, when you need it. You will need a follow up appointment in:  6 months.  Please call our office 2 months in advance to schedule this appointment.  You may see Philip Nahser, MD or one of the following Advanced Practice Providers on your designated Care Team: Scott Weaver, PA-C Vin Bhagat, PA-C . Janine Hammond, NP  Any Other Special Instructions Will Be Listed Below (If Applicable).    

## 2018-01-07 ENCOUNTER — Encounter: Payer: Self-pay | Admitting: Family Medicine

## 2018-01-07 ENCOUNTER — Ambulatory Visit (INDEPENDENT_AMBULATORY_CARE_PROVIDER_SITE_OTHER): Payer: Medicare HMO | Admitting: Family Medicine

## 2018-01-07 ENCOUNTER — Other Ambulatory Visit: Payer: Self-pay

## 2018-01-07 VITALS — BP 122/72 | HR 76 | Temp 98.4°F | Ht 66.5 in | Wt 161.2 lb

## 2018-01-07 DIAGNOSIS — I5022 Chronic systolic (congestive) heart failure: Secondary | ICD-10-CM

## 2018-01-07 DIAGNOSIS — R609 Edema, unspecified: Secondary | ICD-10-CM | POA: Diagnosis not present

## 2018-01-07 DIAGNOSIS — I10 Essential (primary) hypertension: Secondary | ICD-10-CM | POA: Diagnosis not present

## 2018-01-07 DIAGNOSIS — I4891 Unspecified atrial fibrillation: Secondary | ICD-10-CM

## 2018-01-07 DIAGNOSIS — I872 Venous insufficiency (chronic) (peripheral): Secondary | ICD-10-CM

## 2018-01-07 DIAGNOSIS — R600 Localized edema: Secondary | ICD-10-CM

## 2018-01-07 LAB — BASIC METABOLIC PANEL
BUN: 19 mg/dL (ref 6–23)
CALCIUM: 8.9 mg/dL (ref 8.4–10.5)
CHLORIDE: 104 meq/L (ref 96–112)
CO2: 26 meq/L (ref 19–32)
CREATININE: 0.96 mg/dL (ref 0.40–1.20)
GFR: 58.13 mL/min — ABNORMAL LOW (ref >60.00)
Glucose, Bld: 106 mg/dL — ABNORMAL HIGH (ref 70–99)
Potassium: 4 mEq/L (ref 3.5–5.1)
SODIUM: 139 meq/L (ref 135–145)

## 2018-01-07 NOTE — Patient Instructions (Signed)
Consider over the counter Nexium or Prilosec once daily as needed for heartburn.

## 2018-01-07 NOTE — Progress Notes (Signed)
Subjective:     Patient ID: [name], female   DOB: 06/17/1926, 84 y.o.   MRN: [id number]  HPI Patient seen for medical follow-up.  Her chronic problems include history of hypertension, chronic atrial fibrillation, chronic systolic heart failure with ejection fraction around 20%, GERD.  Her breathing is about the same as usual.  She still walks about 1 to 2 miles per day.  She has to stop and rest frequently.  Her weight is up slightly today.  She remains on furosemide 40 mg in the morning and 20 mg early afternoon.  Recent discontinuation of Aldactone per cardiology.  Patient needs follow-up electrolytes.  She continues to have balance issues but no recent falls.  She has neuropathy in both feet which is probably contributing.  She refuses to use a cane or other assistance.  She had some continued GERD symptoms.  Usually improved with Tums.  She tried Pepcid without relief.  Past Medical History:  Diagnosis Date  . Atrial fib/flutter, transient   . B12 DEFICIENCY 11/25/2009  . CHF 01/27/2009  . Chronic systolic heart failure (HCC) 07/25/2009   a. Myoview 5/12:  Low risk, no ischemia, apical lateral defect probably breast attenuation, inf HK, EF 45%.  b. Echo 8/12: EF 45-50%, inf/inf-septal and apical HK, mild LVH, mild to mod MR, mild to mod TR, PASP 50;  c.  Echo 3/14:  Mild LVH, EF 20%, diff HK, mild to mod MR, mild LAE, mild to mod reduced RVSF, mild RAE, mild to mod TR, PASP 41  . DIVERTICULITIS OF COLON 01/27/2009  . ESSENTIAL HYPERTENSION 09/30/2009  . Hemorrhoids   . Hypertension   . Left ventricular dysfunction   . OSTEOARTHRITIS, KNEE, RIGHT 02/08/2009  . OSTEOPENIA 01/27/2009  . Rectal bleeding   . SMALL BOWEL OBSTRUCTION 05/05/2008   Past Surgical History:  Procedure Laterality Date  . ABDOMINAL HYSTERECTOMY  1996   TAHBSO  . APPENDECTOMY  1951  . CARDIAC CATHETERIZATION  April 14, 2012   nonobstructive CAD; severe LV dysfunction.   . COLON SURGERY  2006   Sigmoid  colectomy for diverticulitis  . TONSILLECTOMY  1966    reports that she has never smoked. She has never used smokeless tobacco. She reports that she does not drink alcohol or use drugs. family history includes Emphysema in an other family member; Heart disease in her father, sister, and another family member. Allergies  Allergen Reactions  . Bactrim Nausea Only and Other (See Comments)    headache  . Codeine Nausea Only  . Contrast Media [Iodinated Diagnostic Agents] Other (See Comments)    Pt unsure of reaction  . Nitrofurantoin Other (See Comments)    Pt unsure of reaction  . Nitrofurantoin Monohyd Macro Other (See Comments)    Pt unsure of reaction  . Penicillins Nausea And Vomiting  . Sulfamethoxazole-Trimethoprim Other (See Comments)    Pt unsure of reaction     Review of Systems  Constitutional: Negative for appetite change.  Respiratory: Positive for shortness of breath. Negative for cough and wheezing.   Cardiovascular: Positive for leg swelling.  Gastrointestinal: Negative for abdominal pain.  Genitourinary: Negative for dysuria.  Neurological: Negative for dizziness and syncope.  Psychiatric/Behavioral: Negative for confusion.       Objective:   Physical Exam Constitutional:      Appearance: Normal appearance.  Cardiovascular:     Rate and Rhythm: Normal rate and regular rhythm.  Pulmonary:     Effort: Pulmonary effort is normal.  Breath sounds: Normal breath sounds.  Musculoskeletal:     Right lower leg: Edema present.     Left lower leg: Edema present.     Comments: He has 1+ pitting edema lower legs bilaterally.  She states she did not take her Lasix this morning  Neurological:     General: No focal deficit present.     Mental Status: She is alert and oriented to person, place, and time.     Cranial Nerves: No cranial nerve deficit.        Assessment:     #1 chronic atrial fibrillation.  She appears to be in regular rhythm this morning and rate  is fairly stable.  Remains on chronic Coumadin followed through our Coumadin clinic  #2 hypertension stable  #3 chronic systolic heart failure symptomatically stable.  Her weight is up slightly today though and she does have some increased peripheral edema but did not take her medication this morning  #4 GERD    Plan:     -We recommended she is not getting relief with Pepcid consider at least short-term use of Nexium or Prilosec over-the-counter -Continue close follow-up with Coumadin clinic -Recheck basic metabolic panel -We discussed fall prevention.  We have suggested that she use a cane or other additional support but she refuses at this time.  We discussed possible physical therapy but she declines. -Again reiterated the importance of watching her sodium intake and try to eat less processed/ canned food -routine follow up in 3 months and sooner prn.  Kristian CoveyBruce W Lorimer Tiberio MD [facility]

## 2018-01-09 ENCOUNTER — Telehealth: Payer: Self-pay

## 2018-01-09 NOTE — Telephone Encounter (Signed)
Copied from CRM [id number]. Topic: General - Inquiry >> Jan 09, 2018 10:34 AM Baldo Daub L wrote: Reason for CRM:   Pt states she is calling to get lab results. Pt can be reached at [phone]

## 2018-01-09 NOTE — Telephone Encounter (Signed)
Called patient and gave her lab results: Electrolytes and renal function stable.   Patient verbalized an understanding.

## 2018-01-13 ENCOUNTER — Ambulatory Visit (INDEPENDENT_AMBULATORY_CARE_PROVIDER_SITE_OTHER): Payer: Medicare Other | Admitting: General Practice

## 2018-01-13 DIAGNOSIS — I4891 Unspecified atrial fibrillation: Secondary | ICD-10-CM | POA: Diagnosis not present

## 2018-01-13 DIAGNOSIS — Z7901 Long term (current) use of anticoagulants: Secondary | ICD-10-CM

## 2018-01-13 LAB — POCT INR: INR: 3 (ref 2.0–3.0)

## 2018-01-13 NOTE — Patient Instructions (Addendum)
Pre visit review using our clinic review tool, if applicable. No additional management support is needed unless otherwise documented below in the visit note.  Continue to take 1 tablet daily except 1/2 tablet on Monday/Wed/Fridays.  Re-check in 4 weeks.  

## 2018-01-27 ENCOUNTER — Other Ambulatory Visit: Payer: Self-pay | Admitting: Family Medicine

## 2018-01-31 ENCOUNTER — Ambulatory Visit: Payer: Medicare Other | Admitting: Podiatry

## 2018-01-31 ENCOUNTER — Ambulatory Visit (INDEPENDENT_AMBULATORY_CARE_PROVIDER_SITE_OTHER): Payer: Medicare Other

## 2018-01-31 ENCOUNTER — Other Ambulatory Visit: Payer: Self-pay | Admitting: Podiatry

## 2018-01-31 ENCOUNTER — Encounter: Payer: Self-pay | Admitting: Podiatry

## 2018-01-31 VITALS — BP 142/71 | HR 83 | Resp 16

## 2018-01-31 DIAGNOSIS — M25571 Pain in right ankle and joints of right foot: Secondary | ICD-10-CM

## 2018-01-31 DIAGNOSIS — M25572 Pain in left ankle and joints of left foot: Secondary | ICD-10-CM

## 2018-01-31 DIAGNOSIS — M722 Plantar fascial fibromatosis: Secondary | ICD-10-CM | POA: Diagnosis not present

## 2018-01-31 DIAGNOSIS — M779 Enthesopathy, unspecified: Secondary | ICD-10-CM

## 2018-01-31 MED ORDER — TRIAMCINOLONE ACETONIDE 10 MG/ML IJ SUSP
10.0000 mg | Freq: Once | INTRAMUSCULAR | Status: AC
  Administered 2018-01-31: 10 mg

## 2018-01-31 NOTE — Patient Instructions (Signed)

## 2018-02-03 NOTE — Progress Notes (Signed)
Subjective:   Patient ID: [name], female   DOB: 85 y.o.   MRN: [id number]   HPI Patient states is been mostly in the heels that have been sore and that the patient goes to the gym 3 times a week and also states the ankle swell periodically and patient does have compression stockings that she utilizes.  Patient does not smoke and does like to be active   Review of Systems  All other systems reviewed and are negative.       Objective:  Physical Exam Vitals signs and nursing note reviewed.  Constitutional:      Appearance: She is well-developed.  Pulmonary:     Effort: Pulmonary effort is normal.  Musculoskeletal: Normal range of motion.  Skin:    General: Skin is warm.  Neurological:     Mental Status: She is alert.     Neurovascular status found to be intact muscle strength is adequate range of motion was diminished but normal for her age with mild crepitus of the ankle bilateral.  Patient has discomfort plantar heel region bilateral depression of the arch and moderate pain around the ankle that appears to be secondary.  Patient has good digital perfusion and is well oriented x3 and presents with caregiver     Assessment:  Acute plantar fasciitis bilateral with possibility for ankle arthritis or subtalar joint inflammation bilateral     Plan:  H&P conditions reviewed and today I did sterile prep and then injected the plantar fascial bilateral 3 mg Kenalog 5 mg Xylocaine and applied fascial brace to hold the arch is in stable position and lift up the plantar fascia.  Educated patient on supportive shoes and stretching exercises and reappoint to reevaluate  X-rays indicate that there is moderate osteoporosis with spurring formation but no aggressive arthritis was noted

## 2018-02-10 ENCOUNTER — Ambulatory Visit (INDEPENDENT_AMBULATORY_CARE_PROVIDER_SITE_OTHER): Payer: Medicare Other | Admitting: General Practice

## 2018-02-10 DIAGNOSIS — Z7901 Long term (current) use of anticoagulants: Secondary | ICD-10-CM

## 2018-02-10 DIAGNOSIS — I4891 Unspecified atrial fibrillation: Secondary | ICD-10-CM | POA: Diagnosis not present

## 2018-02-10 LAB — POCT INR: INR: 1.8 — AB (ref 2.0–3.0)

## 2018-02-10 NOTE — Patient Instructions (Addendum)
Pre visit review using our clinic review tool, if applicable. No additional management support is needed unless otherwise documented below in the visit note.  Take extra 1/2 tablet today and then continue to take 1 tablet daily except 1/2 tablet on Monday/Wed/Fridays.  Re-check in 4 weeks.

## 2018-02-28 ENCOUNTER — Ambulatory Visit: Payer: Medicare Other | Admitting: Podiatry

## 2018-03-03 ENCOUNTER — Ambulatory Visit (INDEPENDENT_AMBULATORY_CARE_PROVIDER_SITE_OTHER): Payer: Medicare Other | Admitting: Podiatry

## 2018-03-03 ENCOUNTER — Encounter: Payer: Self-pay | Admitting: Podiatry

## 2018-03-03 DIAGNOSIS — M779 Enthesopathy, unspecified: Secondary | ICD-10-CM

## 2018-03-03 DIAGNOSIS — G629 Polyneuropathy, unspecified: Secondary | ICD-10-CM

## 2018-03-03 MED ORDER — TRIAMCINOLONE ACETONIDE 10 MG/ML IJ SUSP
10.0000 mg | Freq: Once | INTRAMUSCULAR | Status: AC
  Administered 2018-03-03: 10 mg

## 2018-03-03 MED ORDER — GABAPENTIN 300 MG PO CAPS: 300.0000 mg | ORAL_CAPSULE | Freq: Three times a day (TID) | ORAL | 3 refills | Status: DC

## 2018-03-04 NOTE — Progress Notes (Signed)
Subjective:   Patient ID: [name], female   DOB: 85 y.o.   MRN: [id number]   HPI Patient states I am still having pain and most of it now seems to be in my ankles and it is hard for me to be active and I am frustrated because I like to work out and I am also getting a lot of burning pain   ROS      Objective:  Physical Exam  Neurovascular status intact with patient's pain mostly located now in the ankles and she is complaining of what appears to be moderate neuropathic-like symptomatology which is most likely not related any organic cause     Assessment:  Inflammatory capsulitis now localized in the ankles bilateral with probability for idiopathic neuropathy     Plan:  H&P conditions reviewed and at this point I have recommended injection of the ankle which was done after sterile prep of each sinus tarsi.  I then went ahead discussed neuropathy and will get a try her on low-dose gabapentin 1 pill at night and if it does well for her and seems to make a difference that she does not have any drowsiness she can at possibly 1 in the morning or mid afternoon and she will be seen back in 8 weeks or earlier if needed and was strongly encouraged not to take the medicine if it causes any drowsy like effects for

## 2018-03-10 ENCOUNTER — Ambulatory Visit (INDEPENDENT_AMBULATORY_CARE_PROVIDER_SITE_OTHER): Payer: Medicare Other | Admitting: General Practice

## 2018-03-10 DIAGNOSIS — Z7901 Long term (current) use of anticoagulants: Secondary | ICD-10-CM

## 2018-03-10 DIAGNOSIS — I4891 Unspecified atrial fibrillation: Secondary | ICD-10-CM

## 2018-03-10 LAB — POCT INR: INR: 2 (ref 2.0–3.0)

## 2018-03-10 NOTE — Patient Instructions (Addendum)
Pre visit review using our clinic review tool, if applicable. No additional management support is needed unless otherwise documented below in the visit note.  Continue to take 1 tablet daily except 1/2 tablet on Monday/Wed/Fridays.  Re-check in 4 weeks.  

## 2018-03-28 ENCOUNTER — Ambulatory Visit: Payer: Medicare Other | Admitting: Podiatry

## 2018-03-31 ENCOUNTER — Telehealth: Payer: Self-pay | Admitting: Family Medicine

## 2018-03-31 NOTE — Telephone Encounter (Signed)
We need to try to keep her out.  Phone visit if any concerns.

## 2018-03-31 NOTE — Telephone Encounter (Signed)
Called patient for Dr. Caryl Never and patient wants to do a telephone visit on 04/08/18 at 8am. Patient verbalized an understanding.

## 2018-03-31 NOTE — Telephone Encounter (Signed)
Copied from CRM [id number]. Topic: Quick Communication - See Telephone Encounter >> Mar 31, 2018 11:07 AM Jens Som A wrote: CRM for notification. See Telephone encounter for: 03/31/18.  Patient son is calling to see if is necessary to still keep his mother's appt on 04/07/18 & 04/08/18 Thank you [phone] (H)

## 2018-03-31 NOTE — Telephone Encounter (Signed)
Please see message.   Dr. Leonard Schwartz, if patient is having any symptoms do you want to set up a telephone visit?  Arline Asp, please see message for appointment for Coumadin clinic.

## 2018-04-02 ENCOUNTER — Other Ambulatory Visit: Payer: Self-pay | Admitting: Cardiovascular Disease

## 2018-04-02 MED ORDER — FUROSEMIDE 40 MG PO TABS: ORAL_TABLET | ORAL | 2 refills | Status: DC

## 2018-04-07 ENCOUNTER — Ambulatory Visit: Payer: Medicare Other

## 2018-04-08 ENCOUNTER — Ambulatory Visit: Payer: Medicare Other | Admitting: Family Medicine

## 2018-04-08 ENCOUNTER — Ambulatory Visit (INDEPENDENT_AMBULATORY_CARE_PROVIDER_SITE_OTHER): Payer: Medicare Other | Admitting: Family Medicine

## 2018-04-08 ENCOUNTER — Other Ambulatory Visit: Payer: Self-pay

## 2018-04-08 DIAGNOSIS — I4891 Unspecified atrial fibrillation: Secondary | ICD-10-CM | POA: Diagnosis not present

## 2018-04-08 DIAGNOSIS — I5022 Chronic systolic (congestive) heart failure: Secondary | ICD-10-CM | POA: Diagnosis not present

## 2018-04-08 DIAGNOSIS — I10 Essential (primary) hypertension: Secondary | ICD-10-CM

## 2018-04-08 NOTE — Progress Notes (Signed)
Patient ID: [name], female   DOB: Apr 21, 1926, 85 y.o.   MRN: [id number]  Virtual Visit via Telephone Note  I connected with [name] on 04/08/18 at  8:00 AM EDT by telephone and verified that I am speaking with the correct person using two identifiers.   I discussed the limitations, risks, security and privacy concerns of performing an evaluation and management service by telephone and the availability of in person appointments. I also discussed with the patient that there may be a patient responsible charge related to this service. The patient expressed understanding and agreed to proceed.  Location patient: home Location provider: work or home office Participants present for the call: patient, provider Patient did not have a visit in the prior 7 days to address this/these issue(s).   History of Present Illness:  Patient is seen by phone follow-up given current COVID-19 crisis.  She has multiple chronic problems including history of chronic kidney disease, chronic atrial fibrillation, hypertension, chronic systolic heart failure, hyperlipidemia  Medications reviewed with patient.  Compliant with all.  She states her edema is about the same.  She has chronic dyspnea with exertion which is unchanged.   Weight stable.  INR was stable at 2.0 back early part of this month.  She has follow-up scheduled for April 6.  Past Medical History:  Diagnosis Date  . Atrial fib/flutter, transient   . B12 DEFICIENCY 11/25/2009  . CHF 01/27/2009  . Chronic systolic heart failure (HCC) 07/25/2009   a. Myoview 5/12:  Low risk, no ischemia, apical lateral defect probably breast attenuation, inf HK, EF 45%.  b. Echo 8/12: EF 45-50%, inf/inf-septal and apical HK, mild LVH, mild to mod MR, mild to mod TR, PASP 50;  c.  Echo 3/14:  Mild LVH, EF 20%, diff HK, mild to mod MR, mild LAE, mild to mod reduced RVSF, mild RAE, mild to mod TR, PASP 41  . DIVERTICULITIS OF COLON 01/27/2009  . ESSENTIAL  HYPERTENSION 09/30/2009  . Hemorrhoids   . Hypertension   . Left ventricular dysfunction   . OSTEOARTHRITIS, KNEE, RIGHT 02/08/2009  . OSTEOPENIA 01/27/2009  . Rectal bleeding   . SMALL BOWEL OBSTRUCTION 05/05/2008   Past Surgical History:  Procedure Laterality Date  . ABDOMINAL HYSTERECTOMY  1996   TAHBSO  . APPENDECTOMY  1951  . CARDIAC CATHETERIZATION  April 14, 2012   nonobstructive CAD; severe LV dysfunction.   . COLON SURGERY  2006   Sigmoid colectomy for diverticulitis  . TONSILLECTOMY  1966    reports that she has never smoked. She has never used smokeless tobacco. She reports that she does not drink alcohol or use drugs. family history includes Emphysema in an other family member; Heart disease in her father, sister, and another family member. Allergies  Allergen Reactions  . Bactrim Nausea Only and Other (See Comments)    headache  . Codeine Nausea Only  . Contrast Media [Iodinated Diagnostic Agents] Other (See Comments)    Pt unsure of reaction  . Nitrofurantoin Other (See Comments)    Pt unsure of reaction  . Nitrofurantoin Monohyd Macro Other (See Comments)    Pt unsure of reaction  . Penicillins Nausea And Vomiting  . Sulfamethoxazole-Trimethoprim Other (See Comments)    Pt unsure of reaction      Observations/Objective: Patient sounds cheerful and well on the phone. I do not appreciate any SOB. Speech and thought processing are grossly intact. Patient reported vitals:  Assessment and Plan: #1 chronic systolic heart failure  symptomatically stable -Continue current medications -Continue to monitor daily body weights  #2 hypertension.  This is been stable by recent readings.  She does not have capability currently to check blood pressure at home  #3 chronic atrial fibrillation.  Patient remains on Coumadin -Recheck INR in 1 week  Follow Up Instructions:  -We will plan on 9-month office follow-up if we are open at that point -15 minutes were spent on  this phone call follow-up   [id number] [id number] [id number] [id number] 21-30 I did not refer this patient for an OV in the next 24 hours for this/these issue(s).  I discussed the assessment and treatment plan with the patient. The patient was provided an opportunity to ask questions and all were answered. The patient agreed with the plan and demonstrated an understanding of the instructions.   The patient was advised to call back or seek an in-person evaluation if the symptoms worsen or if the condition fails to improve as anticipated.  I provided 15 minutes of non-face-to-face time during this encounter.   Evelena Peat, MD

## 2018-04-14 ENCOUNTER — Ambulatory Visit (INDEPENDENT_AMBULATORY_CARE_PROVIDER_SITE_OTHER): Payer: Medicare Other | Admitting: General Practice

## 2018-04-14 ENCOUNTER — Other Ambulatory Visit: Payer: Self-pay

## 2018-04-14 DIAGNOSIS — Z7901 Long term (current) use of anticoagulants: Secondary | ICD-10-CM

## 2018-04-14 DIAGNOSIS — I4891 Unspecified atrial fibrillation: Secondary | ICD-10-CM

## 2018-04-14 LAB — POCT INR: INR: 2.7 (ref 2.0–3.0)

## 2018-04-14 NOTE — Patient Instructions (Addendum)
Pre visit review using our clinic review tool, if applicable. No additional management support is needed unless otherwise documented below in the visit note.  Continue to take 1 tablet daily except 1/2 tablet on Monday/Wed/Fridays.  Re-check in 6 weeks.    

## 2018-04-16 ENCOUNTER — Other Ambulatory Visit: Payer: Self-pay

## 2018-04-23 ENCOUNTER — Ambulatory Visit: Payer: Medicare Other | Admitting: Podiatry

## 2018-05-11 ENCOUNTER — Other Ambulatory Visit: Payer: Self-pay | Admitting: Family Medicine

## 2018-05-26 ENCOUNTER — Other Ambulatory Visit: Payer: Self-pay

## 2018-05-26 ENCOUNTER — Ambulatory Visit (INDEPENDENT_AMBULATORY_CARE_PROVIDER_SITE_OTHER): Payer: Medicare Other | Admitting: General Practice

## 2018-05-26 DIAGNOSIS — Z7901 Long term (current) use of anticoagulants: Secondary | ICD-10-CM

## 2018-05-26 DIAGNOSIS — I4891 Unspecified atrial fibrillation: Secondary | ICD-10-CM | POA: Diagnosis not present

## 2018-05-26 LAB — POCT INR: INR: 2.9 (ref 2.0–3.0)

## 2018-05-26 NOTE — Patient Instructions (Signed)
Pre visit review using our clinic review tool, if applicable. No additional management support is needed unless otherwise documented below in the visit note.  Continue to take 1 tablet daily except 1/2 tablet on Monday/Wed/Fridays.  Re-check in 6 weeks.    

## 2018-05-27 ENCOUNTER — Other Ambulatory Visit: Payer: Self-pay

## 2018-05-27 MED ORDER — ATORVASTATIN CALCIUM 20 MG PO TABS: 20.0000 mg | ORAL_TABLET | Freq: Every day | ORAL | 1 refills | Status: DC

## 2018-06-05 ENCOUNTER — Other Ambulatory Visit: Payer: Self-pay

## 2018-06-05 MED ORDER — POTASSIUM CHLORIDE CRYS ER 10 MEQ PO TBCR: 30.0000 meq | EXTENDED_RELEASE_TABLET | Freq: Every day | ORAL | 1 refills | Status: DC

## 2018-06-19 ENCOUNTER — Telehealth: Payer: Self-pay | Admitting: *Deleted

## 2018-06-19 NOTE — Telephone Encounter (Signed)
Spoke with patient and patient consent to telephone visit    Confirm consent - "In the setting of the current Covid19 crisis, you are scheduled for a (phone or video) visit with your provider on (date) at (time).  Just as we do with many in-office visits, in order for you to participate in this visit, we must obtain consent.  If you'd like, I can send this to your mychart (if signed up) or email for you to review.  Otherwise, I can obtain your verbal consent now.  All virtual visits are billed to your insurance company just like a normal visit would be.  By agreeing to a virtual visit, we'd like you to understand that the technology does not allow for your provider to perform an examination, and thus may limit your provider's ability to fully assess your condition. If your provider identifies any concerns that need to be evaluated in person, we will make arrangements to do so.  Finally, though the technology is pretty good, we cannot assure that it will always work on either your or our end, and in the setting of a video visit, we may have to convert it to a phone-only visit.  In either situation, we cannot ensure that we have a secure connection.  Are you willing to proceed?" STAFF: Did the patient verbally acknowledge consent to telehealth visit? Document YES/NO here:YES    TELEPHONE CALL NOTE  [name] has been deemed a candidate for a follow-up tele-health visit to limit community exposure during the Covid-19 pandemic. I spoke with the patient via phone to ensure availability of phone/video source, confirm preferred email & phone number, and discuss instructions and expectations.  I reminded [name] to be prepared with any vital sign and/or heart rhythm information that could potentially be obtained via home monitoring, at the time of her visit. I reminded [name] to expect a phone call prior to her visit.  Oleta MouseOverton,Rooney Gladwin M, CMA 06/19/2018 3:13 PM    FULL LENGTH CONSENT  FOR TELE-HEALTH VISIT   I hereby voluntarily request, consent and authorize [facility] and its employed or contracted physicians, physician assistants, nurse practitioners or other licensed health care professionals (the Practitioner), to provide me with telemedicine health care services (the "Services") as deemed necessary by the treating Practitioner. I acknowledge and consent to receive the Services by the Practitioner via telemedicine. I understand that the telemedicine visit will involve communicating with the Practitioner through live audiovisual communication technology and the disclosure of certain medical information by electronic transmission. I acknowledge that I have been given the opportunity to request an in-person assessment or other available alternative prior to the telemedicine visit and am voluntarily participating in the telemedicine visit.  I understand that I have the right to withhold or withdraw my consent to the use of telemedicine in the course of my care at any time, without affecting my right to future care or treatment, and that the Practitioner or I may terminate the telemedicine visit at any time. I understand that I have the right to inspect all information obtained and/or recorded in the course of the telemedicine visit and may receive copies of available information for a reasonable fee.  I understand that some of the potential risks of receiving the Services via telemedicine include:  Marland Kitchen. Delay or interruption in medical evaluation due to technological equipment failure or disruption; . Information transmitted may not be sufficient (e.g. poor resolution of images) to allow for appropriate medical decision making by the Practitioner;  and/or  . In rare instances, security protocols could fail, causing a breach of personal health information.  Furthermore, I acknowledge that it is my responsibility to provide information about my medical history, conditions and care that is  complete and accurate to the best of my ability. I acknowledge that Practitioner's advice, recommendations, and/or decision may be based on factors not within their control, such as incomplete or inaccurate data provided by me or distortions of diagnostic images or specimens that may result from electronic transmissions. I understand that the practice of medicine is not an exact science and that Practitioner makes no warranties or guarantees regarding treatment outcomes. I acknowledge that I will receive a copy of this consent concurrently upon execution via email to the email address I last provided but may also request a printed copy by calling the office of [facility].    I understand that my insurance will be billed for this visit.   I have read or had this consent read to me. . I understand the contents of this consent, which adequately explains the benefits and risks of the Services being provided via telemedicine.  . I have been provided ample opportunity to ask questions regarding this consent and the Services and have had my questions answered to my satisfaction. . I give my informed consent for the services to be provided through the use of telemedicine in my medical care  By participating in this telemedicine visit I agree to the above.

## 2018-06-19 NOTE — Telephone Encounter (Signed)
Lvm for patient to call clinic back to set up for virtual visit

## 2018-06-26 NOTE — Progress Notes (Signed)
Virtual Visit via Telephone Note   This visit type was conducted due to national recommendations for restrictions regarding the COVID-19 Pandemic (e.g. social distancing) in an effort to limit this patient's exposure and mitigate transmission in our community.  Due to her co-morbid illnesses, this patient is at least at moderate risk for complications without adequate follow up.  This format is felt to be most appropriate for this patient at this time.  The patient did not have access to video technology/had technical difficulties with video requiring transitioning to audio format only (telephone).  All issues noted in this document were discussed and addressed.  No physical exam could be performed with this format.  Please refer to the patient's chart for her  consent [facility].   Date:  06/27/2018   ID:  [name], DOB 03/19/1926, MRN [id number]  Patient Location: Home Provider Location: Office  PCP:  Kristian CoveyBurchette, Bruce W, MD  Cardiologist:  Kristeen MissPhilip Nahser, MD   Electrophysiologist:  None   Evaluation Performed:  Follow-Up Visit  Chief Complaint:  FU on AFib, CHF  History of Present Illness:    [name] EstelleFlorence Dubose is a 85 y.o. female with:  Chronic systolic heart failure  Echo 3/14: EF 20  Nonischemic cardiomyopathy  Coronary artery disease  LHC 2014: Nonobstructive CAD  Permanent atrial fibrillation  Failed cardioversion in 2012  CHADS2-VASc=5 (female, age x 2, CHF, HTN); Coumadin Rx   Hypertension  She was last seen by Dr. Elease HashimotoNahser in December 2019.  Today, she notes she is doing about the same.  She does not like to take medication.  She thinks that the metoprolol makes her dizzy.  She has episodes of shortness of breath and dizziness.  She tells me that this has been going on for a very long time.  She does not feel it is changing.  She tries to go walking on a regular basis.  She is able to walk about 4-5 blocks without shortness of breath.  She  has not had orthopnea, paroxysmal nocturnal dyspnea.  She has not had syncope or near syncope.  She did fall about 3 weeks ago and hurt her knee.  She has a lot of issues with neuropathy.  Primary care follows her Coumadin.  She has not had any bleeding, melena or hematochezia.   episodiThe patient does not have symptoms concerning for COVID-19 infection (fever, chills, cough, or new shortness of breath).    Past Medical History:  Diagnosis Date   Atrial fib/flutter, transient    B12 DEFICIENCY 11/25/2009   CHF 01/27/2009   Chronic systolic heart failure (HCC) 07/25/2009   a. Myoview 5/12:  Low risk, no ischemia, apical lateral defect probably breast attenuation, inf HK, EF 45%.  b. Echo 8/12: EF 45-50%, inf/inf-septal and apical HK, mild LVH, mild to mod MR, mild to mod TR, PASP 50;  c.  Echo 3/14:  Mild LVH, EF 20%, diff HK, mild to mod MR, mild LAE, mild to mod reduced RVSF, mild RAE, mild to mod TR, PASP 41   DIVERTICULITIS OF COLON 01/27/2009   ESSENTIAL HYPERTENSION 09/30/2009   Hemorrhoids    Hypertension    Left ventricular dysfunction    OSTEOARTHRITIS, KNEE, RIGHT 02/08/2009   OSTEOPENIA 01/27/2009   Rectal bleeding    SMALL BOWEL OBSTRUCTION 05/05/2008   Past Surgical History:  Procedure Laterality Date   ABDOMINAL HYSTERECTOMY  1996   TAHBSO   APPENDECTOMY  1951   CARDIAC CATHETERIZATION  April 14, 2012  nonobstructive CAD; severe LV dysfunction.    COLON SURGERY  2006   Sigmoid colectomy for diverticulitis   TONSILLECTOMY  1966     Current Meds  Medication Sig   atorvastatin (LIPITOR) 20 MG tablet Take 1 tablet (20 mg total) by mouth daily.   Ferrous Gluconate (IRON 27 PO) Take 1 tablet by mouth daily.   furosemide (LASIX) 40 MG tablet TAKE 1 TABLET BY MOUTH IN THE MORNING AND 1/2 (ONE-HALF) IN THE EVENING   hydrocortisone cream 0.5 % Apply 1 application topically 2 (two) times daily.   loratadine (CLARITIN) 10 MG tablet Take 10 mg by mouth daily.      metoprolol tartrate (LOPRESSOR) 100 MG tablet TAKE 1 TABLET BY MOUTH TWICE DAILY   Multiple Vitamin (MULTIVITAMIN) tablet Take 1 tablet by mouth daily.     potassium chloride (KLOR-CON M10) 10 MEQ tablet Take 3 tablets (30 mEq total) by mouth daily.   warfarin (COUMADIN) 5 MG tablet Take 1 tablet daily except take 1/2 on Mon Wed and Fri or TAKE AS DIRECTED BY ANTICOAGULATION CLINIC.  90 day     Allergies:   Gabapentin, Bactrim, Codeine, Contrast media [iodinated diagnostic agents], Nitrofurantoin, Nitrofurantoin monohyd macro, Penicillins, and Sulfamethoxazole-trimethoprim   Social History   Tobacco Use   Smoking status: Never Smoker   Smokeless tobacco: Never Used  Substance Use Topics   Alcohol use: No   Drug use: No     Family Hx: The patient's family history includes Emphysema in an other family member; Heart disease in her father, sister, and another family member.  ROS:   Please see the history of present illness.    All other systems reviewed and are negative.   Prior CV studies:   The following studies were reviewed today:  Cardiac catheterization 04/14/2012 Left mainstem: Normal Left anterior descending (LAD): 30-40% mid vessel. First diagonal is normal. Left circumflex (LCx): Large codominant vessel. Focal 40-50% after OM2.  Right coronary artery (RCA): 30% mid vessel. Left ventriculography: Left ventricular systolic function is abnormal. There is inferior wall akinesis with global hypokinesis. Overall EF estimated at 30%. Final Conclusions:   1. Nonobstructive coronary artery disease. 2. Severe LV dysfunction. 3. Normal right heart pressures.  Myoview 04/03/2012 Overall there is question of very slight ischemia at the apical cap. This is a borderline call. This is a low-risk scan. There is no significant area of scar. There is increased uptake in the right ventricular wall which may suggest some right ventricular hypertrophy.  LV Ejection Fraction: Study  not gated.   Echocardiogram 03/26/2012 Mild LVH, EF 20, diffuse HK, mild to moderate MR, mild LAE, mild to moderate reduced RVSF, mild RAE, mild to moderate TR, PASP 41  Echocardiogram 09/01/2010 EF 45-50, inferior/inferoseptal and apical HK, mild LVH, mild to moderate MR, mild to moderate TR, PASP 50  Myoview 05/29/2010 EF 45; Low risk stress nuclear study.  There is no evidence of ischemia.  There is mild fixed  attenuation of the apical lateral wall but this wall contracts well and this defect may be due to breast artifact.  The LV function is mildly reduced with inferior hypokinesis.     Labs/Other Tests and Data Reviewed:    EKG:  No ECG reviewed.  Recent Labs: 07/17/2017: Pro B Natriuretic peptide (BNP) 527.0 09/04/2017: Hemoglobin 13.7; Platelets 125 10/04/2017: ALT 17 01/07/2018: BUN 19; Creatinine, Ser 0.96; Potassium 4.0; Sodium 139   Recent Lipid Panel Lab Results  Component Value Date/Time   CHOL 138 10/04/2017  08:13 AM   TRIG 154.0 (H) 10/04/2017 08:13 AM   HDL 53.50 10/04/2017 08:13 AM   CHOLHDL 3 10/04/2017 08:13 AM   LDLCALC 53 10/04/2017 08:13 AM   LDLDIRECT 142.0 12/16/2013 08:57 AM     Wt Readings from Last 3 Encounters:  06/27/18 155 lb (70.3 kg)  01/07/18 161 lb 3.2 oz (73.1 kg)  12/24/17 162 lb 12.8 oz (73.8 kg)     Objective:    Vital Signs:  Ht 5' 6.5" (1.689 m)    Wt 155 lb (70.3 kg)    BMI 24.64 kg/m    VITAL SIGNS:  reviewed GEN:  no acute distress RESPIRATORY:  no labored breathing NEURO:  alert and oriented PSYCH:  normal affect  ASSESSMENT & PLAN:    1. Permanent atrial fibrillation She seems to be tolerating this.  Heart rate was in the 70s when she was last seen in the office.  Her dizziness is chronic without significant change.  Therefore, I do not think she needs a Holter monitor.  Coumadin is managed by primary care.  We discussed the importance of taking rate control therapy.  She understands this.  2. Chronic systolic congestive  heart failure (HCC) EF 20%.  Continue beta-blocker.  She is resistant to medications.  Therefore, I will not try to add ACE inhibitor therapy at this time.  She has a lot of issues with dizziness as well.  Volume seems to be stable.  Continue current therapy.  3. Essential hypertension She is unable to check her blood pressure.  I will arrange for her to get a blood pressure machine through our [facility].  4. Coronary artery disease involving native coronary artery of native heart without angina pectoris Mild to moderate nonobstructive coronary disease by cardiac catheterization in 2014.  She is not on aspirin as she is on warfarin anticoagulation.  Continue statin.  She denies angina.  5. Educated About Covid-19 Virus Infection The signs and symptoms of COVID-19 were discussed with the patient and how to seek care for testing (follow up with PCP or arrange E-visit).  The importance of social distancing was discussed today.  Time:   Today, I have spent 17 minutes with the patient with telehealth technology discussing the above problems.     Medication Adjustments/Labs and Tests Ordered: Current medicines are reviewed at length with the patient today.  Concerns regarding medicines are outlined above.   Tests Ordered: No orders of the defined types were placed in this encounter.   Medication Changes: No orders of the defined types were placed in this encounter.   Follow Up:  Virtual Visit or In Person in 6 month(s) Dr. Acie Fredrickson or Richardson Dopp, PA-C   Signed, Richardson Dopp, PA-C  06/27/2018 8:25 AM    [place]

## 2018-06-27 ENCOUNTER — Other Ambulatory Visit: Payer: Self-pay

## 2018-06-27 ENCOUNTER — Encounter: Payer: Self-pay | Admitting: Physician Assistant

## 2018-06-27 ENCOUNTER — Telehealth (INDEPENDENT_AMBULATORY_CARE_PROVIDER_SITE_OTHER): Payer: Medicare Other | Admitting: Physician Assistant

## 2018-06-27 VITALS — Ht 66.5 in | Wt 155.0 lb

## 2018-06-27 DIAGNOSIS — I4821 Permanent atrial fibrillation: Secondary | ICD-10-CM

## 2018-06-27 DIAGNOSIS — I10 Essential (primary) hypertension: Secondary | ICD-10-CM

## 2018-06-27 DIAGNOSIS — I2510 Atherosclerotic heart disease of native coronary artery without angina pectoris: Secondary | ICD-10-CM | POA: Diagnosis not present

## 2018-06-27 DIAGNOSIS — I5022 Chronic systolic (congestive) heart failure: Secondary | ICD-10-CM | POA: Diagnosis not present

## 2018-06-27 DIAGNOSIS — Z7189 Other specified counseling: Secondary | ICD-10-CM

## 2018-06-27 NOTE — Patient Instructions (Signed)
Medication Instructions:  No changes  If you need a refill on your cardiac medications before your next appointment, please call your pharmacy.   Lab work: None   If you have labs (blood work) drawn today and your tests are completely normal, you will receive your results only by: Marland Kitchen MyChart Message (if you have MyChart) OR . A paper copy in the mail If you have any lab test that is abnormal or we need to change your treatment, we will call you to review the results.  Testing/Procedures: None   Follow-Up: At [facility], you and your health needs are our priority.  As part of our continuing mission to provide you with exceptional heart care, we have created designated Provider Care Teams.  These Care Teams include your primary Cardiologist (physician) and Advanced Practice Providers (APPs -  Physician Assistants and Nurse Practitioners) who all work together to provide you with the care you need, when you need it. . Dr. Acie Fredrickson or Richardson Dopp, PA-C in 6 mos  Any Other Special Instructions Will Be Listed Below (If Applicable).  I will have a blood pressure cuff sent to you so you can check your blood pressure

## 2018-06-30 ENCOUNTER — Telehealth: Payer: Self-pay | Admitting: Licensed Clinical Social Worker

## 2018-06-30 NOTE — Telephone Encounter (Signed)
CSW referred to assist patient with obtaining a BP cuff. CSW contacted patient to inform cuff will be delivered to home next week. Patient grateful for support and assistance. CSW available as needed. Jackie Huyen Perazzo, LCSW, CCSW-MCS [phone]  

## 2018-07-07 ENCOUNTER — Ambulatory Visit (INDEPENDENT_AMBULATORY_CARE_PROVIDER_SITE_OTHER): Payer: Medicare Other | Admitting: General Practice

## 2018-07-07 DIAGNOSIS — Z7901 Long term (current) use of anticoagulants: Secondary | ICD-10-CM | POA: Diagnosis not present

## 2018-07-07 DIAGNOSIS — I4891 Unspecified atrial fibrillation: Secondary | ICD-10-CM

## 2018-07-07 LAB — POCT INR: INR: 3.2 — AB (ref 2.0–3.0)

## 2018-07-07 NOTE — Patient Instructions (Signed)
Pre visit review using our clinic review tool, if applicable. No additional management support is needed unless otherwise documented below in the visit note.  Hold coumadin today and then continue to take 1 tablet daily except 1/2 tablet on Monday/Wed/Fridays.  Re-check in 6 weeks.

## 2018-07-16 NOTE — Progress Notes (Signed)
Agree with coumadin management.  Bruce W Burchette MD [facility]  

## 2018-07-29 ENCOUNTER — Other Ambulatory Visit: Payer: Self-pay | Admitting: Cardiovascular Disease

## 2018-08-05 DIAGNOSIS — H353131 Nonexudative age-related macular degeneration, bilateral, early dry stage: Secondary | ICD-10-CM | POA: Diagnosis not present

## 2018-08-05 DIAGNOSIS — Z961 Presence of intraocular lens: Secondary | ICD-10-CM | POA: Diagnosis not present

## 2018-08-05 DIAGNOSIS — H1712 Central corneal opacity, left eye: Secondary | ICD-10-CM | POA: Diagnosis not present

## 2018-08-05 DIAGNOSIS — H04123 Dry eye syndrome of bilateral lacrimal glands: Secondary | ICD-10-CM | POA: Diagnosis not present

## 2018-08-18 ENCOUNTER — Other Ambulatory Visit: Payer: Self-pay

## 2018-08-18 ENCOUNTER — Ambulatory Visit (INDEPENDENT_AMBULATORY_CARE_PROVIDER_SITE_OTHER): Payer: Medicare Other | Admitting: General Practice

## 2018-08-18 DIAGNOSIS — I4891 Unspecified atrial fibrillation: Secondary | ICD-10-CM | POA: Diagnosis not present

## 2018-08-18 DIAGNOSIS — Z7901 Long term (current) use of anticoagulants: Secondary | ICD-10-CM

## 2018-08-18 LAB — POCT INR: INR: 2.2 (ref 2.0–3.0)

## 2018-08-18 NOTE — Patient Instructions (Signed)
Pre visit review using our clinic review tool, if applicable. No additional management support is needed unless otherwise documented below in the visit note.  Continue to take 1 tablet daily except 1/2 tablet on Monday/Wed/Fridays.  Re-check in 6 weeks.

## 2018-08-29 ENCOUNTER — Other Ambulatory Visit: Payer: Self-pay

## 2018-08-29 ENCOUNTER — Ambulatory Visit (INDEPENDENT_AMBULATORY_CARE_PROVIDER_SITE_OTHER): Payer: Medicare Other | Admitting: Family Medicine

## 2018-08-29 ENCOUNTER — Encounter: Payer: Self-pay | Admitting: Family Medicine

## 2018-08-29 VITALS — BP 138/72 | HR 79 | Temp 98.3°F | Ht 66.5 in | Wt 161.8 lb

## 2018-08-29 DIAGNOSIS — N183 Chronic kidney disease, stage 3 unspecified: Secondary | ICD-10-CM

## 2018-08-29 DIAGNOSIS — Z23 Encounter for immunization: Secondary | ICD-10-CM | POA: Diagnosis not present

## 2018-08-29 DIAGNOSIS — I10 Essential (primary) hypertension: Secondary | ICD-10-CM | POA: Diagnosis not present

## 2018-08-29 DIAGNOSIS — E785 Hyperlipidemia, unspecified: Secondary | ICD-10-CM

## 2018-08-29 DIAGNOSIS — G629 Polyneuropathy, unspecified: Secondary | ICD-10-CM | POA: Diagnosis not present

## 2018-08-29 DIAGNOSIS — I4891 Unspecified atrial fibrillation: Secondary | ICD-10-CM

## 2018-08-29 DIAGNOSIS — I5022 Chronic systolic (congestive) heart failure: Secondary | ICD-10-CM

## 2018-08-29 LAB — BASIC METABOLIC PANEL
BUN: 20 mg/dL (ref 6–23)
CO2: 30 mEq/L (ref 19–32)
Calcium: 9.3 mg/dL (ref 8.4–10.5)
Chloride: 101 mEq/L (ref 96–112)
Creatinine, Ser: 0.98 mg/dL (ref 0.40–1.20)
GFR: 53.33 mL/min — ABNORMAL LOW (ref >60.00)
Glucose, Bld: 90 mg/dL (ref 70–99)
Potassium: 3.7 mEq/L (ref 3.5–5.1)
Sodium: 141 mEq/L (ref 135–145)

## 2018-08-29 LAB — CBC WITH DIFFERENTIAL/PLATELET
Basophils Absolute: 0 10*3/uL (ref 0.0–0.1)
Basophils Relative: 0.5 % (ref 0.0–3.0)
Eosinophils Absolute: 0.3 10*3/uL (ref 0.0–0.7)
Eosinophils Relative: 4.6 % (ref 0.0–5.0)
HCT: 40.3 % (ref 36.0–46.0)
Hemoglobin: 13.6 g/dL (ref 12.0–15.0)
Lymphocytes Relative: 42.5 % (ref 12.0–46.0)
Lymphs Abs: 2.6 10*3/uL (ref 0.7–4.0)
MCHC: 33.7 g/dL (ref 30.0–36.0)
MCV: 99.3 fl (ref 78.0–100.0)
Monocytes Absolute: 0.8 10*3/uL (ref 0.1–1.0)
Monocytes Relative: 13.2 % — ABNORMAL HIGH (ref 3.0–12.0)
Neutro Abs: 2.4 10*3/uL (ref 1.4–7.7)
Neutrophils Relative %: 39.2 % — ABNORMAL LOW (ref 43.0–77.0)
Platelets: 123 10*3/uL — ABNORMAL LOW (ref 150.0–400.0)
RBC: 4.06 Mil/uL (ref 3.87–5.11)
RDW: 13.5 % (ref 11.5–15.5)
WBC: 6.1 10*3/uL (ref 4.0–10.5)

## 2018-08-29 LAB — HEPATIC FUNCTION PANEL
ALT: 20 U/L (ref 0–35)
AST: 26 U/L (ref 0–37)
Albumin: 4.5 g/dL (ref 3.5–5.2)
Alkaline Phosphatase: 83 U/L (ref 39–117)
Bilirubin, Direct: 0.3 mg/dL (ref 0.0–0.3)
Total Bilirubin: 1.2 mg/dL (ref 0.2–1.2)
Total Protein: 7 g/dL (ref 6.0–8.3)

## 2018-08-29 LAB — LIPID PANEL
Cholesterol: 181 mg/dL (ref 0–200)
HDL: 67.3 mg/dL (ref >39.00)
LDL Cholesterol: 87 mg/dL (ref 0–99)
NonHDL: 113.24
Total CHOL/HDL Ratio: 3
Triglycerides: 132 mg/dL (ref 0.0–149.0)
VLDL: 26.4 mg/dL (ref 0.0–40.0)

## 2018-08-29 NOTE — Progress Notes (Signed)
Subjective:     Patient ID: [name], female   DOB: 01/24/1926, 85 y.o.   MRN: [id number]  HPI Patient is here for medical follow-up.  She has multiple chronic problems including hypertension, systolic heart failure, atrial fibrillation, chronic bilateral leg edema, GERD, polyneuropathy, chronic kidney disease, history of B12 deficiency, chronic Coumadin.  She brings in a long laundry list of complaints as usual and these are basically the same complaints she generally has  She states that she feels "off balance ".  She has not fallen recently.  This is likely related to her polyneuropathy.  We have strongly suggested that she at least use a cane but she declines.  She has had physical therapy in the past.  No recent syncope.  She has chronic dyspnea which is unchanged.  She continues to walk daily.  Her weight is stable.  No orthopnea.  She remains on beta-blocker and Lasix.  Chronic foot and ankle complaints.  She has seen multiple podiatrists in the past.  Denies recent injury.  She feels very isolated with not being to go the gym or church.  This has been very difficult for her.  She still has contact with her son daily  Remains on Coumadin for atrial fibrillation and is followed by Coumadin clinic  Past Medical History:  Diagnosis Date  . Atrial fib/flutter, transient   . B12 DEFICIENCY 11/25/2009  . CHF 01/27/2009  . Chronic systolic heart failure (HCC) 07/25/2009   a. Myoview 5/12:  Low risk, no ischemia, apical lateral defect probably breast attenuation, inf HK, EF 45%.  b. Echo 8/12: EF 45-50%, inf/inf-septal and apical HK, mild LVH, mild to mod MR, mild to mod TR, PASP 50;  c.  Echo 3/14:  Mild LVH, EF 20%, diff HK, mild to mod MR, mild LAE, mild to mod reduced RVSF, mild RAE, mild to mod TR, PASP 41  . DIVERTICULITIS OF COLON 01/27/2009  . ESSENTIAL HYPERTENSION 09/30/2009  . Hemorrhoids   . Hypertension   . Left ventricular dysfunction   . OSTEOARTHRITIS, KNEE, RIGHT  02/08/2009  . OSTEOPENIA 01/27/2009  . Rectal bleeding   . SMALL BOWEL OBSTRUCTION 05/05/2008   Past Surgical History:  Procedure Laterality Date  . ABDOMINAL HYSTERECTOMY  1996   TAHBSO  . APPENDECTOMY  1951  . CARDIAC CATHETERIZATION  April 14, 2012   nonobstructive CAD; severe LV dysfunction.   . COLON SURGERY  2006   Sigmoid colectomy for diverticulitis  . TONSILLECTOMY  1966    reports that she has never smoked. She has never used smokeless tobacco. She reports that she does not drink alcohol or use drugs. family history includes Emphysema in an other family member; Heart disease in her father, sister, and another family member. Allergies  Allergen Reactions  . Gabapentin Other (See Comments)    Did not feel like her self so she stopped.   . Bactrim Nausea Only and Other (See Comments)    headache  . Codeine Nausea Only  . Contrast Media [Iodinated Diagnostic Agents] Other (See Comments)    Pt unsure of reaction  . Nitrofurantoin Other (See Comments)    Pt unsure of reaction  . Nitrofurantoin Monohyd Macro Other (See Comments)    Pt unsure of reaction  . Penicillins Nausea And Vomiting  . Sulfamethoxazole-Trimethoprim Other (See Comments)    Pt unsure of reaction     Review of Systems  Constitutional: Negative for chills and fever.  Respiratory: Positive for shortness of breath. Negative for  cough and wheezing.   Cardiovascular: Positive for leg swelling. Negative for chest pain.  Gastrointestinal: Negative for abdominal pain and blood in stool.  Genitourinary: Negative for dysuria.  Neurological: Negative for dizziness, syncope and weakness.       Objective:   Physical Exam Constitutional:      Appearance: Normal appearance.  Neck:     Musculoskeletal: Neck supple.  Cardiovascular:     Comments: Irregular rhythm but rate controlled Pulmonary:     Effort: Pulmonary effort is normal.     Breath sounds: Normal breath sounds.  Musculoskeletal:     Comments: She  has trace to 1+ pitting edema lower legs bilaterally which is near her baseline  Neurological:     General: No focal deficit present.     Mental Status: She is alert.        Assessment:     #1 chronic atrial fibrillation.  Rate controlled.  #2 hypertension stable and at goal  #3 balance issues.  Suspect largely related to her peripheral neuropathy  #4 history of chronic systolic heart failure stable  #5 chronic kidney disease stage III  #6 hyperlipidemia    Plan:     -Check labs today with basic metabolic panel, CBC, hepatic panel, lipid panel -Flu vaccine given -Strongly advised aid for walking such as cane but she declines -We readdressed the issue of whether she would like to consider further physical therapy which she declines at this time. -Continue monthly follow-up with Coumadin clinic.  We will plan 43-month follow-up here unless indicated otherwise of any new symptoms or other concerns  Eulas Post MD [facility] at [facility]

## 2018-08-29 NOTE — Addendum Note (Signed)
Addended by: Anibal Henderson on: 08/29/2018 10:12 AM   Modules accepted: Orders

## 2018-09-05 ENCOUNTER — Other Ambulatory Visit: Payer: Self-pay | Admitting: Family Medicine

## 2018-09-16 ENCOUNTER — Telehealth: Payer: Self-pay

## 2018-09-16 NOTE — Telephone Encounter (Signed)
Patient is not sure what was given to her. Are you able to answer her question or do I need to contact her dentist?  Copied from [place] [id number]. Topic: General - Other >> Sep 16, 2018 12:59 PM Pauline Good wrote: Reason for CRM: pt went to dentist and was given something for her gums and want to know if it's ok for her to use it.

## 2018-09-16 NOTE — Telephone Encounter (Signed)
No way we can answer unless we know what they gave her.

## 2018-09-18 NOTE — Telephone Encounter (Signed)
Left message on machine for patient to return our call with name of medication the dentist gave her.  CRM

## 2018-09-18 NOTE — Telephone Encounter (Signed)
Copied from [place] [id number]. Topic: Quick Communication - See Telephone Encounter >> Sep 18, 2018  2:36 PM Lamarr Lulas, CMA wrote: CRM for notification. See Telephone encounter for: 09/18/18. Okay for triage nurse to speak with patient  Left message on machine for patient to return our call with name of medication the dentist gave her. >> Sep 18, 2018  3:15 PM Carolyn Stare wrote:   Pt said she gave the person  the name pf the medication,   The dentist gave her sodium floride sf 1.1 /00  gel Cream for removing food from under her partial , she want to ask Dr Elease Hashimoto if it is ok for her to use

## 2018-09-19 NOTE — Telephone Encounter (Signed)
OK to use 

## 2018-09-23 ENCOUNTER — Telehealth: Payer: Self-pay

## 2018-09-23 NOTE — Telephone Encounter (Signed)
Please see message. Thank you!  Copied from [place] [phone]. Topic: Quick Communication - See Telephone Encounter >> Sep 23, 2018  2:17 PM Lamarr Lulas, CMA wrote: CRM for notification. See Telephone encounter for: 09/23/18. Okay for triage nurse to speak with patient and give recommendation. >> Sep 23, 2018  2:50 PM Keene Breath wrote: Patient is returning a call to [facility].  She missed the call and is not sure why Anaeli Cornwall called.  Tried the office but Jinny Blossom was not available.  Please call patient back at [phone]

## 2018-09-23 NOTE — Telephone Encounter (Signed)
Left message on machine for patient to return our call CRM 

## 2018-09-29 ENCOUNTER — Ambulatory Visit (INDEPENDENT_AMBULATORY_CARE_PROVIDER_SITE_OTHER): Payer: Medicare Other | Admitting: General Practice

## 2018-09-29 ENCOUNTER — Other Ambulatory Visit: Payer: Self-pay

## 2018-09-29 DIAGNOSIS — I4891 Unspecified atrial fibrillation: Secondary | ICD-10-CM

## 2018-09-29 DIAGNOSIS — Z7901 Long term (current) use of anticoagulants: Secondary | ICD-10-CM

## 2018-09-29 LAB — POCT INR: INR: 3.1 — AB (ref 2.0–3.0)

## 2018-09-29 NOTE — Patient Instructions (Addendum)
Pre visit review using our clinic review tool, if applicable. No additional management support is needed unless otherwise documented below in the visit note.  Hold coumadin today (9/21) and then continue to take 1 tablet daily except 1/2 tablet on Monday/Wed/Fridays.  Re-check in 4 weeks.

## 2018-10-20 ENCOUNTER — Other Ambulatory Visit: Payer: Self-pay | Admitting: Family Medicine

## 2018-10-22 ENCOUNTER — Other Ambulatory Visit: Payer: Self-pay | Admitting: Cardiovascular Disease

## 2018-10-27 ENCOUNTER — Ambulatory Visit (INDEPENDENT_AMBULATORY_CARE_PROVIDER_SITE_OTHER): Payer: Medicare Other | Admitting: General Practice

## 2018-10-27 ENCOUNTER — Other Ambulatory Visit: Payer: Self-pay

## 2018-10-27 DIAGNOSIS — Z7901 Long term (current) use of anticoagulants: Secondary | ICD-10-CM

## 2018-10-27 LAB — POCT INR: INR: 2.4 (ref 2.0–3.0)

## 2018-10-27 NOTE — Patient Instructions (Signed)
Pre visit review using our clinic review tool, if applicable. No additional management support is needed unless otherwise documented below in the visit note.  Continue to take 1 tablet daily except 1/2 tablet on Monday/Wed/Fridays.  Re-check in 4 weeks.  

## 2018-11-07 ENCOUNTER — Ambulatory Visit: Payer: Medicare Other | Admitting: Podiatry

## 2018-11-10 ENCOUNTER — Ambulatory Visit: Payer: Medicare Other | Admitting: Podiatry

## 2018-11-24 ENCOUNTER — Ambulatory Visit (INDEPENDENT_AMBULATORY_CARE_PROVIDER_SITE_OTHER): Payer: Medicare Other | Admitting: General Practice

## 2018-11-24 ENCOUNTER — Other Ambulatory Visit: Payer: Self-pay

## 2018-11-24 DIAGNOSIS — Z7901 Long term (current) use of anticoagulants: Secondary | ICD-10-CM | POA: Diagnosis not present

## 2018-11-24 LAB — POCT INR: INR: 3.5 — AB (ref 2.0–3.0)

## 2018-11-24 NOTE — Patient Instructions (Signed)
Pre visit review using our clinic review tool, if applicable. No additional management support is needed unless otherwise documented below in the visit note.  Skip coumadin today and take 1/2 tablet tomorrow.  On Wednesday continue to take 1 tablet daily except 1/2 tablet on Monday/Wed/Fridays.  Re-check in 3 weeks.

## 2018-11-26 ENCOUNTER — Ambulatory Visit: Payer: Medicare Other | Admitting: Podiatry

## 2018-12-01 ENCOUNTER — Telehealth: Payer: Self-pay | Admitting: *Deleted

## 2018-12-01 ENCOUNTER — Ambulatory Visit: Payer: Medicare Other | Admitting: Podiatry

## 2018-12-01 ENCOUNTER — Other Ambulatory Visit: Payer: Self-pay

## 2018-12-01 ENCOUNTER — Encounter: Payer: Self-pay | Admitting: Podiatry

## 2018-12-01 DIAGNOSIS — M779 Enthesopathy, unspecified: Secondary | ICD-10-CM | POA: Diagnosis not present

## 2018-12-01 DIAGNOSIS — M76822 Posterior tibial tendinitis, left leg: Secondary | ICD-10-CM | POA: Diagnosis not present

## 2018-12-01 DIAGNOSIS — R600 Localized edema: Secondary | ICD-10-CM

## 2018-12-01 DIAGNOSIS — M79662 Pain in left lower leg: Secondary | ICD-10-CM

## 2018-12-01 NOTE — Telephone Encounter (Signed)
Orders to [facility]. 

## 2018-12-01 NOTE — Progress Notes (Signed)
Subjective:   Patient ID: [name], female   DOB: 85 y.o.   MRN: [id number]   HPI Patient presents stating she has had some swelling in her left ankle and has pain on both the inside and the outside of the ankle and states that she tries to be active but it is hard for her now to be on her foot and it is in her entire foot that hurts mostly on the inside and outside and the swelling also bothers her   ROS      Objective:  Physical Exam  Neurovascular status intact with mild positive Bevelyn Buckles' sign but both calfs are sore.  There is edema in the ankle region left with inflammation medial lateral around the posterior tibial tendon into the sinus tarsi with moderate discomfort when pressed.  Patient does have good digital perfusion well oriented x3     Assessment:  Acute inflammatory condition with sinus tarsitis posterior tibial tendinitis left     Plan:  H&P reviewed condition and at this point I did do a careful injection on the medial side posterior tib 3 mg Dexasone Kenalog 5 mg Xylocaine and I did sterile prep and did a sinus tarsi injection left 3 mg Kenalog 5 mg Xylocaine and I went ahead and applied ankle compression stocking and due to just the overall generalized foot pain and inability to bear weight on it I did go ahead and I applied a fracture walker with instructions on usage.  As precautionary measure I am going to send her for a Doppler but at this point I do not see any indications of clotting and I explained to her caregiver what to do if any changes were to occur to inform us immediately and we are just doing this is a screening test

## 2018-12-09 ENCOUNTER — Other Ambulatory Visit: Payer: Self-pay

## 2018-12-09 ENCOUNTER — Ambulatory Visit (HOSPITAL_COMMUNITY)
Admission: RE | Admit: 2018-12-09 | Discharge: 2018-12-09 | Disposition: A | Discharge status: Home / Self Care | Payer: Medicare Other | Source: Ambulatory Visit | Attending: Cardiology | Admitting: Cardiology

## 2018-12-09 DIAGNOSIS — R600 Localized edema: Secondary | ICD-10-CM | POA: Diagnosis not present

## 2018-12-09 DIAGNOSIS — M79662 Pain in left lower leg: Secondary | ICD-10-CM | POA: Diagnosis not present

## 2018-12-19 ENCOUNTER — Other Ambulatory Visit: Payer: Self-pay | Admitting: Family Medicine

## 2018-12-22 ENCOUNTER — Other Ambulatory Visit: Payer: Self-pay

## 2018-12-22 ENCOUNTER — Ambulatory Visit: Payer: Medicare Other | Admitting: Podiatry

## 2018-12-22 ENCOUNTER — Ambulatory Visit (INDEPENDENT_AMBULATORY_CARE_PROVIDER_SITE_OTHER): Payer: Medicare Other | Admitting: General Practice

## 2018-12-22 DIAGNOSIS — I4891 Unspecified atrial fibrillation: Secondary | ICD-10-CM | POA: Diagnosis not present

## 2018-12-22 DIAGNOSIS — Z7901 Long term (current) use of anticoagulants: Secondary | ICD-10-CM

## 2018-12-22 LAB — POCT INR: INR: 2.9 (ref 2.0–3.0)

## 2018-12-22 NOTE — Patient Instructions (Addendum)
Pre visit review using our clinic review tool, if applicable. No additional management support is needed unless otherwise documented below in the visit note.  Continue to take 1 tablet daily except 1/2 tablet on Monday/Wed/Fridays.  Re-check in 4 weeks.  

## 2018-12-24 ENCOUNTER — Encounter: Payer: Self-pay | Admitting: Podiatry

## 2018-12-24 ENCOUNTER — Other Ambulatory Visit: Payer: Self-pay

## 2018-12-24 ENCOUNTER — Ambulatory Visit: Payer: Medicare Other | Admitting: Podiatry

## 2018-12-24 DIAGNOSIS — M779 Enthesopathy, unspecified: Secondary | ICD-10-CM

## 2018-12-24 DIAGNOSIS — G629 Polyneuropathy, unspecified: Secondary | ICD-10-CM

## 2018-12-24 DIAGNOSIS — R600 Localized edema: Secondary | ICD-10-CM

## 2018-12-24 DIAGNOSIS — M76822 Posterior tibial tendinitis, left leg: Secondary | ICD-10-CM | POA: Diagnosis not present

## 2018-12-24 NOTE — Progress Notes (Signed)
Subjective:   Patient ID: [name], female   DOB: 85 y.o.   MRN: [id number]   HPI Patient states she is feeling quite a bit better still has some swelling of her foot is concerned about neuropathy and also the veins that she has and the pain she experiences at different times   ROS      Objective:  Physical Exam  Neurovascular status unchanged with varicosities around the ankle region left with inflammation of the sinus tarsi improved with moderate discomfort on the plantar heel and complaints of numbness     Assessment:  Patient who has poor venous flow left with edema with chronic discomfort which is under control with compression and medication     Plan:  H&P reviewed condition and at this point I recommended continued utilization of ankle compression stocking along with insoles to try to support the arch that are over-the-counter and were dispensed today and compression stockings.  Patient will be seen back as needed and will probably have on and off problems with this foot secondary to long standing conditions and arthritis along with neuropathy

## 2018-12-29 ENCOUNTER — Other Ambulatory Visit: Payer: Self-pay

## 2018-12-29 ENCOUNTER — Ambulatory Visit (INDEPENDENT_AMBULATORY_CARE_PROVIDER_SITE_OTHER): Payer: Medicare Other | Admitting: Family Medicine

## 2018-12-29 ENCOUNTER — Encounter: Payer: Self-pay | Admitting: Family Medicine

## 2018-12-29 VITALS — BP 118/76 | HR 78 | Temp 97.8°F | Ht 66.5 in | Wt 162.4 lb

## 2018-12-29 DIAGNOSIS — N183 Chronic kidney disease, stage 3 unspecified: Secondary | ICD-10-CM

## 2018-12-29 DIAGNOSIS — I10 Essential (primary) hypertension: Secondary | ICD-10-CM | POA: Diagnosis not present

## 2018-12-29 DIAGNOSIS — I4891 Unspecified atrial fibrillation: Secondary | ICD-10-CM

## 2018-12-29 DIAGNOSIS — G629 Polyneuropathy, unspecified: Secondary | ICD-10-CM

## 2018-12-29 DIAGNOSIS — I5022 Chronic systolic (congestive) heart failure: Secondary | ICD-10-CM | POA: Diagnosis not present

## 2018-12-29 NOTE — Progress Notes (Signed)
Subjective:     Patient ID: [name], female   DOB: Jan 03, 1927, 85 y.o.   MRN: [id number]  HPI [name] is here for medical follow-up.  Her problems include hypertension, atrial fibrillation, chronic kidney disease, hyperlipidemia.  We reviewed all her medications and she is compliant with all.  Her weight is stable.  No increased peripheral edema.  She has some chronic dyspnea which is unchanged.  No orthopnea.  She complains of frequent bowel movements.  She states she has up to 3 a day but these are formed.  No abdominal pain.  No nausea or vomiting.  No bloody stools.  She does have some external hemorrhoids and use hydrocortisone cream for those.  She cannot get into the bathtub for sits baths.  No change in bowel habits otherwise  She has some chronic foot difficulties.  She recently saw a podiatrist and had some inserts but has had some difficulty wearing those.  She is try to use compression hose lower extremities.  She has chronic neuropathy symptoms but is declined medications.  She had intolerance with gabapentin.  Past Medical History:  Diagnosis Date  . Atrial fib/flutter, transient   . B12 DEFICIENCY 11/25/2009  . CHF 01/27/2009  . Chronic systolic heart failure ([place]) 07/25/2009   a. Myoview 5/12:  Low risk, no ischemia, apical lateral defect probably breast attenuation, inf HK, EF 45%.  b. Echo 8/12: EF 45-50%, inf/inf-septal and apical HK, mild LVH, mild to mod MR, mild to mod TR, PASP 50;  c.  Echo 3/14:  Mild LVH, EF 20%, diff HK, mild to mod MR, mild LAE, mild to mod reduced RVSF, mild RAE, mild to mod TR, PASP 41  . DIVERTICULITIS OF COLON 01/27/2009  . ESSENTIAL HYPERTENSION 09/30/2009  . Hemorrhoids   . Hypertension   . Left ventricular dysfunction   . OSTEOARTHRITIS, KNEE, RIGHT 02/08/2009  . OSTEOPENIA 01/27/2009  . Rectal bleeding   . SMALL BOWEL OBSTRUCTION 05/05/2008   Past Surgical History:  Procedure Laterality Date  . ABDOMINAL HYSTERECTOMY  1996    TAHBSO  . APPENDECTOMY  1951  . CARDIAC CATHETERIZATION  April 14, 2012   nonobstructive CAD; severe LV dysfunction.   . COLON SURGERY  2006   Sigmoid colectomy for diverticulitis  . TONSILLECTOMY  1966    reports that she has never smoked. She has never used smokeless tobacco. She reports that she does not drink alcohol or use drugs. family history includes Emphysema in an other family member; Heart disease in her father, sister, and another family member. Allergies  Allergen Reactions  . Gabapentin Nausea And Vomiting and Other (See Comments)    Did not feel like her self so she stopped.   . Bactrim Nausea Only and Other (See Comments)    headache  . Codeine Nausea Only  . Contrast Media [Iodinated Diagnostic Agents] Other (See Comments)    Pt unsure of reaction  . Nitrofurantoin Other (See Comments)    Pt unsure of reaction  . Nitrofurantoin Monohyd Macro Other (See Comments)    Pt unsure of reaction  . Penicillins Nausea And Vomiting  . Sulfamethoxazole-Trimethoprim Other (See Comments)    Pt unsure of reaction     Review of Systems  Constitutional: Negative for appetite change, chills, fatigue, fever and unexpected weight change.  Eyes: Negative for visual disturbance.  Respiratory: Positive for shortness of breath. Negative for cough, chest tightness and wheezing.   Cardiovascular: Negative for chest pain, palpitations and leg swelling.  Gastrointestinal: Negative for abdominal pain, blood in stool and diarrhea.  Genitourinary: Negative for dysuria.  Neurological: Negative for dizziness, seizures, syncope, weakness, light-headedness and headaches.       Objective:   Physical Exam Vitals reviewed.  Constitutional:      Appearance: Normal appearance.  Cardiovascular:     Comments: Irregular rhythm consistent with her A. fib but rate controlled Pulmonary:     Effort: Pulmonary effort is normal.     Breath sounds: Normal breath sounds. No wheezing or rales.   Musculoskeletal:     Comments: Trace edema legs bilaterally which is stable.  She has significant varicosities bilaterally  Skin:    Findings: No rash.  Neurological:     General: No focal deficit present.     Mental Status: She is alert and oriented to person, place, and time.        Assessment:     #1 chronic atrial fibrillation rate controlled and on Coumadin.  #2 hypertension stable and at goal  #3 chronic systolic heart failure symptomatically stable  #4 chronic kidney disease stable by recent labs back in August  #5 at risk for falls especially with her neuropathy    Plan:     -We readdressed her neuropathy symptoms but she declines medication again.  We have again encouraged cane use or walker use but she declines -Continue current medications -Flu vaccine already given -Continue daily activities with walking for exercise as tolerated -We will plan 85-month follow-up and get follow-up labs then  [name] MD [facility] at [facility]

## 2018-12-30 ENCOUNTER — Other Ambulatory Visit: Payer: Self-pay | Admitting: Family Medicine

## 2019-01-19 ENCOUNTER — Ambulatory Visit (INDEPENDENT_AMBULATORY_CARE_PROVIDER_SITE_OTHER): Payer: Medicare Other | Admitting: General Practice

## 2019-01-19 ENCOUNTER — Other Ambulatory Visit: Payer: Self-pay

## 2019-01-19 DIAGNOSIS — Z7901 Long term (current) use of anticoagulants: Secondary | ICD-10-CM

## 2019-01-19 LAB — POCT INR: INR: 3 (ref 2.0–3.0)

## 2019-01-19 NOTE — Patient Instructions (Incomplete)
Pre visit review using our clinic review tool, if applicable. No additional management support is needed unless otherwise documented below in the visit note.  Change dosage and take 1 tablet daily except 1/2 tablet on Monday/Wed/Fridays and Saturdays.    Re-check in 4 weeks.

## 2019-01-26 ENCOUNTER — Other Ambulatory Visit: Payer: Self-pay | Admitting: Cardiovascular Disease

## 2019-02-16 ENCOUNTER — Other Ambulatory Visit: Payer: Self-pay

## 2019-02-16 ENCOUNTER — Ambulatory Visit (INDEPENDENT_AMBULATORY_CARE_PROVIDER_SITE_OTHER): Payer: Medicare Other | Admitting: General Practice

## 2019-02-16 DIAGNOSIS — Z7901 Long term (current) use of anticoagulants: Secondary | ICD-10-CM | POA: Diagnosis not present

## 2019-02-16 LAB — POCT INR: INR: 1.9 — AB (ref 2.0–3.0)

## 2019-02-16 NOTE — Patient Instructions (Addendum)
Pre visit review using our clinic review tool, if applicable. No additional management support is needed unless otherwise documented below in the visit note.  Take 1 tablet all days except 1/2 tablet on Monday Wednesday and Fridays.  Re-check in 4 weeks.

## 2019-02-19 ENCOUNTER — Other Ambulatory Visit: Payer: Self-pay | Admitting: Family Medicine

## 2019-03-13 ENCOUNTER — Other Ambulatory Visit: Payer: Self-pay

## 2019-03-16 ENCOUNTER — Ambulatory Visit (INDEPENDENT_AMBULATORY_CARE_PROVIDER_SITE_OTHER): Payer: Medicare Other | Admitting: General Practice

## 2019-03-16 ENCOUNTER — Other Ambulatory Visit: Payer: Self-pay

## 2019-03-16 DIAGNOSIS — Z7901 Long term (current) use of anticoagulants: Secondary | ICD-10-CM | POA: Diagnosis not present

## 2019-03-16 LAB — POCT INR: INR: 2.4 (ref 2.0–3.0)

## 2019-03-16 NOTE — Patient Instructions (Signed)
Pre visit review using our clinic review tool, if applicable. No additional management support is needed unless otherwise documented below in the visit note.  Take 1 tablet all days except 1/2 tablet on Monday Wednesday and Fridays.  Re-check in 6 weeks.  

## 2019-04-25 ENCOUNTER — Other Ambulatory Visit: Payer: Self-pay | Admitting: Family Medicine

## 2019-04-27 ENCOUNTER — Ambulatory Visit: Payer: Medicare Other

## 2019-05-08 ENCOUNTER — Other Ambulatory Visit: Payer: Self-pay

## 2019-05-11 ENCOUNTER — Other Ambulatory Visit: Payer: Self-pay

## 2019-05-11 ENCOUNTER — Ambulatory Visit (INDEPENDENT_AMBULATORY_CARE_PROVIDER_SITE_OTHER): Payer: Medicare Other | Admitting: General Practice

## 2019-05-11 DIAGNOSIS — Z7901 Long term (current) use of anticoagulants: Secondary | ICD-10-CM | POA: Diagnosis not present

## 2019-05-11 LAB — POCT INR: INR: 2.9 (ref 2.0–3.0)

## 2019-05-11 NOTE — Patient Instructions (Signed)
Pre visit review using our clinic review tool, if applicable. No additional management support is needed unless otherwise documented below in the visit note.  Take 1 tablet all days except 1/2 tablet on Monday Wednesday and Fridays.  Re-check in 6 weeks.  

## 2019-05-22 ENCOUNTER — Other Ambulatory Visit: Payer: Self-pay | Admitting: Family Medicine

## 2019-06-11 ENCOUNTER — Telehealth: Payer: Self-pay | Admitting: Family Medicine

## 2019-06-11 NOTE — Chronic Care Management (AMB) (Signed)
  Chronic Care Management   Note  06/11/2019 Name: Aime Carreras MRN: [id number] DOB: [birth year]  Itxel Wickard is a 86 y.o. year old female who is a primary care patient of Burchette, Elberta Fortis, MD. I reached out to [facility] by phone today in response to a referral sent by Ms. Jacquelyne Balint PCP, Burchette, Elberta Fortis, MD.   Ms. Holaway was given information about Chronic Care Management services today including:  1. CCM service includes personalized support from designated clinical staff supervised by her physician, including individualized plan of care and coordination with other care providers 2. 24/7 contact phone numbers for assistance for urgent and routine care needs. 3. Service will only be billed when office clinical staff spend 20 minutes or more in a month to coordinate care. 4. Only one practitioner may furnish and bill the service in a calendar month. 5. The patient may stop CCM services at any time (effective at the end of the month) by phone call to the office staff.   Patient agreed to services and verbal consent obtained.   Follow up plan:   Prathima Ghanta [employer] Scheduler

## 2019-06-17 ENCOUNTER — Other Ambulatory Visit: Payer: Self-pay | Admitting: Family Medicine

## 2019-06-22 ENCOUNTER — Other Ambulatory Visit: Payer: Self-pay | Admitting: Cardiovascular Disease

## 2019-06-22 ENCOUNTER — Telehealth: Payer: Self-pay | Admitting: Family Medicine

## 2019-06-22 NOTE — Telephone Encounter (Signed)
Refill already sent in. 

## 2019-06-22 NOTE — Telephone Encounter (Signed)
potassium chloride (KLOR-CON) 10 MEQ tablet  [facility] [address] [place] - [address] Phone:  [phone]  Fax:  [phone]

## 2019-06-22 NOTE — Telephone Encounter (Signed)
FYI- pt called back and said, she found her pills and does not need a refill on her medication.

## 2019-06-26 ENCOUNTER — Ambulatory Visit: Payer: Medicare Other | Admitting: Podiatry

## 2019-06-26 ENCOUNTER — Other Ambulatory Visit: Payer: Self-pay

## 2019-06-29 ENCOUNTER — Ambulatory Visit (INDEPENDENT_AMBULATORY_CARE_PROVIDER_SITE_OTHER): Payer: Medicare Other | Admitting: General Practice

## 2019-06-29 ENCOUNTER — Other Ambulatory Visit: Payer: Self-pay

## 2019-06-29 ENCOUNTER — Ambulatory Visit (INDEPENDENT_AMBULATORY_CARE_PROVIDER_SITE_OTHER): Payer: Medicare Other | Admitting: Family Medicine

## 2019-06-29 ENCOUNTER — Encounter: Payer: Self-pay | Admitting: Family Medicine

## 2019-06-29 VITALS — BP 110/62 | HR 71 | Temp 97.9°F | Wt 168.0 lb

## 2019-06-29 DIAGNOSIS — Z7901 Long term (current) use of anticoagulants: Secondary | ICD-10-CM | POA: Diagnosis not present

## 2019-06-29 DIAGNOSIS — K219 Gastro-esophageal reflux disease without esophagitis: Secondary | ICD-10-CM

## 2019-06-29 DIAGNOSIS — E785 Hyperlipidemia, unspecified: Secondary | ICD-10-CM | POA: Diagnosis not present

## 2019-06-29 DIAGNOSIS — I5022 Chronic systolic (congestive) heart failure: Secondary | ICD-10-CM

## 2019-06-29 DIAGNOSIS — I482 Chronic atrial fibrillation, unspecified: Secondary | ICD-10-CM

## 2019-06-29 LAB — LIPID PANEL
Cholesterol: 131 mg/dL (ref 0–200)
HDL: 56.2 mg/dL (ref >39.00)
LDL Cholesterol: 53 mg/dL (ref 0–99)
NonHDL: 74.45
Total CHOL/HDL Ratio: 2
Triglycerides: 107 mg/dL (ref 0.0–149.0)
VLDL: 21.4 mg/dL (ref 0.0–40.0)

## 2019-06-29 LAB — BASIC METABOLIC PANEL
BUN: 21 mg/dL (ref 6–23)
CO2: 30 mEq/L (ref 19–32)
Calcium: 9.1 mg/dL (ref 8.4–10.5)
Chloride: 104 mEq/L (ref 96–112)
Creatinine, Ser: 0.96 mg/dL (ref 0.40–1.20)
GFR: 54.51 mL/min — ABNORMAL LOW (ref >60.00)
Glucose, Bld: 95 mg/dL (ref 70–99)
Potassium: 4.1 mEq/L (ref 3.5–5.1)
Sodium: 140 mEq/L (ref 135–145)

## 2019-06-29 LAB — HEPATIC FUNCTION PANEL
ALT: 19 U/L (ref 0–35)
AST: 26 U/L (ref 0–37)
Albumin: 4 g/dL (ref 3.5–5.2)
Alkaline Phosphatase: 69 U/L (ref 39–117)
Bilirubin, Direct: 0.3 mg/dL (ref 0.0–0.3)
Total Bilirubin: 1.6 mg/dL — ABNORMAL HIGH (ref 0.2–1.2)
Total Protein: 6.2 g/dL (ref 6.0–8.3)

## 2019-06-29 LAB — POCT INR: INR: 2.4 (ref 2.0–3.0)

## 2019-06-29 NOTE — Patient Instructions (Addendum)
Pre visit review using our clinic review tool, if applicable. No additional management support is needed unless otherwise documented below in the visit note.  Take 1 tablet all days except 1/2 tablet on Monday Wednesday and Fridays.  Re-check in 6 weeks.  

## 2019-06-29 NOTE — Patient Instructions (Signed)
Consider increasing the Lasix to one FULL tablet twice daily for 5 days and then go back to usual dose.

## 2019-06-29 NOTE — Progress Notes (Signed)
Established Patient Office Visit  Subjective:  Patient ID: [name], female    DOB: 02-16-1926  Age: 86 y.o. MRN: [id number]  CC:  Chief Complaint  Patient presents with  . Follow-up    Pt is here for 6 month follow up and is concerned about lightheadedness , Shortness of breathe when walking, balance issues, heartburn and her bowels     HPI [name] presents for medical follow-up.  She is accompanied by her son today with whom she lives.  He has been very helpful to her especially during the pandemic.  She has chronic problems including history of chronic atrial fibrillation, systolic heart failure, hypertension, hyperlipidemia, history of ongoing GERD symptoms.  She remains on Coumadin and is followed through Coumadin clinic.  Her other medications include furosemide 40 mg in the morning and 20 mg in afternoon, potassium supplement, Coumadin, metoprolol, and Lipitor.  She still stays active in her apartment but otherwise has not been exercising as much.  No longer goes to the gym.  Her weight is up 6 pounds today compared to December.  Son thinks she may have slightly increased edema.  She denies any orthopnea.  Occasional lightheadedness.  She has some chronic shortness of breath with activity which is unchanged.  No recent chest pains.  She has some "balance "issues.  She has chronic neuropathy which is likely contributing.  She has been very reluctant to consider medication such as gabapentin or Lyrica partly because of her chronic edema but also fear of sedation and dizziness.  She does have some pain component but this is not severe not interfering with sleep.  She has had some ongoing GERD symptoms and usually has about twice per week flareups.  Not taking anything for that currently  Past Medical History:  Diagnosis Date  . Atrial fib/flutter, transient   . B12 DEFICIENCY 11/25/2009  . CHF 01/27/2009  . Chronic systolic heart failure (HCC) 07/25/2009   a. Myoview  5/12:  Low risk, no ischemia, apical lateral defect probably breast attenuation, inf HK, EF 45%.  b. Echo 8/12: EF 45-50%, inf/inf-septal and apical HK, mild LVH, mild to mod MR, mild to mod TR, PASP 50;  c.  Echo 3/14:  Mild LVH, EF 20%, diff HK, mild to mod MR, mild LAE, mild to mod reduced RVSF, mild RAE, mild to mod TR, PASP 41  . DIVERTICULITIS OF COLON 01/27/2009  . ESSENTIAL HYPERTENSION 09/30/2009  . Hemorrhoids   . Hypertension   . Left ventricular dysfunction   . OSTEOARTHRITIS, KNEE, RIGHT 02/08/2009  . OSTEOPENIA 01/27/2009  . Rectal bleeding   . SMALL BOWEL OBSTRUCTION 05/05/2008    Past Surgical History:  Procedure Laterality Date  . ABDOMINAL HYSTERECTOMY  1996   TAHBSO  . APPENDECTOMY  1951  . CARDIAC CATHETERIZATION  April 14, 2012   nonobstructive CAD; severe LV dysfunction.   . COLON SURGERY  2006   Sigmoid colectomy for diverticulitis  . TONSILLECTOMY  1966    Family History  Problem Relation Age of Onset  . Heart disease Father   . Heart disease Sister   . Heart disease Other   . Emphysema Other     Social History   Socioeconomic History  . Marital status: Widowed    Spouse name: Not on file  . Number of children: Not on file  . Years of education: Not on file  . Highest education level: Not on file  Occupational History  . Not on file  Tobacco  Use  . Smoking status: Never Smoker  . Smokeless tobacco: Never Used  Vaping Use  . Vaping Use: Never used  Substance and Sexual Activity  . Alcohol use: No  . Drug use: No  . Sexual activity: Not Currently  Other Topics Concern  . Not on file  Social History Narrative  . Not on file   Social Determinants of Health   Financial Resource Strain:   . Difficulty of Paying Living Expenses:   Food Insecurity:   . Worried About Charity fundraiser in the Last Year:   . Arboriculturist in the Last Year:   Transportation Needs:   . Film/video editor (Medical):   Marland Kitchen Lack of Transportation (Non-Medical):    Physical Activity:   . Days of Exercise per Week:   . Minutes of Exercise per Session:   Stress:   . Feeling of Stress :   Social Connections:   . Frequency of Communication with Friends and Family:   . Frequency of Social Gatherings with Friends and Family:   . Attends Religious Services:   . Active Member of Clubs or Organizations:   . Attends Archivist Meetings:   Marland Kitchen Marital Status:   Intimate Partner Violence:   . Fear of Current or Ex-Partner:   . Emotionally Abused:   Marland Kitchen Physically Abused:   . Sexually Abused:     Outpatient Medications Prior to Visit  Medication Sig Dispense Refill  . atorvastatin (LIPITOR) 20 MG tablet Take 1 tablet (20 mg total) by mouth daily. 90 tablet 1  . azelastine (OPTIVAR) 0.05 % ophthalmic solution INSTILL 1 TO 2 DROPS INTO EACH EYE ONCE DAILY AS NEEDED FOR ALLERGIES    . cromolyn (OPTICROM) 4 % ophthalmic solution INSTILL 1 DROP INTO EACH EYE TWICE DAILY AS DIRECTED AS NEEDED FOR ALLERGIES    . Ferrous Gluconate (IRON 27 PO) Take 1 tablet by mouth daily.    . furosemide (LASIX) 40 MG tablet TAKE 1 TABLET BY MOUTH IN  THE MORNING AND 1/2 TABLET  IN THE EVENING 45 tablet 0  . hydrocortisone cream 0.5 % Apply 1 application topically 2 (two) times daily.    Marland Kitchen loratadine (CLARITIN) 10 MG tablet Take 10 mg by mouth daily.      . metoprolol tartrate (LOPRESSOR) 100 MG tablet TAKE 1 TABLET BY MOUTH TWICE DAILY . APPOINTMENT REQUIRED FOR FUTURE REFILLS 180 tablet 1  . Multiple Vitamin (MULTIVITAMIN) tablet Take 1 tablet by mouth daily.      . potassium chloride (KLOR-CON) 10 MEQ tablet Take 3 tablets by mouth once daily 270 tablet 0  . warfarin (COUMADIN) 5 MG tablet TAKE 1 TABLET BY MOUTH DAILY EXCEPT TAKE 1/2 TABLET ON MONDAY, WEDNESDAY, AND FRIDAY OR TAKE AS DIRECTED BY ANTICOAGULATION CLINIC 90 tablet 0   No facility-administered medications prior to visit.    Allergies  Allergen Reactions  . Gabapentin Nausea And Vomiting and Other (See  Comments)    Did not feel like her self so she stopped.   . Bactrim Nausea Only and Other (See Comments)    headache  . Codeine Nausea Only  . Contrast Media [Iodinated Diagnostic Agents] Other (See Comments)    Pt unsure of reaction  . Nitrofurantoin Other (See Comments)    Pt unsure of reaction  . Nitrofurantoin Monohyd Macro Other (See Comments)    Pt unsure of reaction  . Penicillins Nausea And Vomiting  . Sulfamethoxazole-Trimethoprim Other (See Comments)  Pt unsure of reaction    ROS Review of Systems  Constitutional: Positive for fatigue. Negative for chills, fever and unexpected weight change.  Respiratory: Positive for shortness of breath. Negative for cough and wheezing.   Cardiovascular: Positive for leg swelling. Negative for chest pain and palpitations.  Gastrointestinal: Negative for abdominal pain.  Genitourinary: Negative for dyspareunia.  Psychiatric/Behavioral: Negative for confusion.      Objective:    Physical Exam Vitals reviewed.  Constitutional:      Appearance: Normal appearance.  Cardiovascular:     Comments: Irregular rhythm but rate controlled Pulmonary:     Effort: Pulmonary effort is normal.     Breath sounds: Normal breath sounds.  Musculoskeletal:     Comments: She has 1+ pitting edema left lower extremity and trace to 1+ right lower extremity  Neurological:     Mental Status: She is alert.     Comments: She has mild impairment with monofilament testing volar surface right and great toes.  No lesions noted.     BP 110/62 (BP Location: Left Arm, Patient Position: Sitting, Cuff Size: Normal)   Pulse 71   Temp 97.9 F (36.6 C) (Temporal)   Wt 168 lb (76.2 kg)   SpO2 95%   BMI 26.71 kg/m  Wt Readings from Last 3 Encounters:  06/29/19 168 lb (76.2 kg)  12/29/18 162 lb 6.4 oz (73.7 kg)  08/29/18 161 lb 12.8 oz (73.4 kg)     Health Maintenance Due  Topic Date Due  . COVID-19 Vaccine (1) Never done  . TETANUS/TDAP  01/08/2013     There are no preventive care reminders to display for this patient.  Lab Results  Component Value Date   TSH 3.93 02/01/2016   Lab Results  Component Value Date   WBC 6.1 08/29/2018   HGB 13.6 08/29/2018   HCT 40.3 08/29/2018   MCV 99.3 08/29/2018   PLT 123.0 Repeated and verified X2. (L) 08/29/2018   Lab Results  Component Value Date   NA 141 08/29/2018   K 3.7 08/29/2018   CO2 30 08/29/2018   GLUCOSE 90 08/29/2018   BUN 20 08/29/2018   CREATININE 0.98 08/29/2018   BILITOT 1.2 08/29/2018   ALKPHOS 83 08/29/2018   AST 26 08/29/2018   ALT 20 08/29/2018   PROT 7.0 08/29/2018   ALBUMIN 4.5 08/29/2018   CALCIUM 9.3 08/29/2018   GFR 53.33 (L) 08/29/2018   Lab Results  Component Value Date   CHOL 181 08/29/2018   Lab Results  Component Value Date   HDL 67.30 08/29/2018   Lab Results  Component Value Date   LDLCALC 87 08/29/2018   Lab Results  Component Value Date   TRIG 132.0 08/29/2018   Lab Results  Component Value Date   CHOLHDL 3 08/29/2018   Lab Results  Component Value Date   HGBA1C 5.8 08/06/2016      Assessment & Plan:   #1 history of chronic systolic heart failure.  She has chronic shortness of breath which is unchanged.  Her weight is up slightly and son thinks this is related to her decreased activity levels.  She does have some increased peripheral edema today but lungs are clear  -We recommended increasing her furosemide to 40 mg twice daily for 5 days and try to drop back to 40 and 20 -Needs follow-up labs with basic metabolic panel  #2 hyperlipidemia.  Patient on Lipitor  -Recheck hepatic and lipid panel  #3 increased risk of falls secondary to  neuropathy. -We suggest that she consider cane use but she declines.  She has had physical therapy in the past.  Handicap sticker was completed.  #4 GERD.  Usually having flareups about twice per week -Suggested over-the-counter Pepcid 20 mg twice daily  #5 chronic atrial fibrillation  which is rate controlled metoprolol. -Continue close follow-up through Coumadin clinic  No orders of the defined types were placed in this encounter.   Follow-up: Return in about 6 months (around 12/29/2019).    Evelena Peat, MD

## 2019-07-10 ENCOUNTER — Encounter: Payer: Self-pay | Admitting: Podiatry

## 2019-07-10 ENCOUNTER — Other Ambulatory Visit: Payer: Self-pay

## 2019-07-10 ENCOUNTER — Ambulatory Visit: Payer: Medicare Other | Admitting: Podiatry

## 2019-07-10 DIAGNOSIS — M722 Plantar fascial fibromatosis: Secondary | ICD-10-CM | POA: Diagnosis not present

## 2019-07-10 DIAGNOSIS — G629 Polyneuropathy, unspecified: Secondary | ICD-10-CM

## 2019-07-10 DIAGNOSIS — M7672 Peroneal tendinitis, left leg: Secondary | ICD-10-CM

## 2019-07-10 NOTE — Progress Notes (Signed)
  Subjective:  Patient ID: [name], female    DOB: 06-25-1926,  MRN: [id number]  Chief Complaint  Patient presents with  . Foot Problem    the left ankle is hurting some and gets numb on the instep    86 y.o. female presents with the above complaint. History confirmed with patient.  She is here today with her son.  The injection several months ago was helpful but has worn off since then.  The ankle compression sleeve currently has been helpful.  Objective:  Physical Exam: warm, good capillary refill and venous insufficiency noted. Left Foot: point tenderness over the heel pad, as well as the peroneal tendons   No images are attached to the encounter.  Radiographs: Not indicated Assessment:   1. Plantar fasciitis   2. Peroneal tendinitis of left lower extremity   3. Polyneuropathy      Plan:  Patient was evaluated and treated and all questions answered.  Plantar Fasciitis and peroneal tendinitis -Educated patient on stretching and icing of the affected limb -Following sterile prep with alcohol, injection was delivered to the left plantar fashion at the insertion of the calcaneal tuber.  The injection consisted of 1 cc 0.5% Marcaine plain, 1 cc of 2% Xylocaine plain, and 0.5 cc of Kenalog 10 -Ankle compression gauntlet, dispensed and shown how to wear today.  Sharl Ma, DPM 07/10/2019      Return if symptoms worsen or fail to improve in 4-6 months.

## 2019-07-10 NOTE — Patient Instructions (Signed)

## 2019-07-11 ENCOUNTER — Other Ambulatory Visit: Payer: Self-pay | Admitting: Family Medicine

## 2019-07-28 ENCOUNTER — Other Ambulatory Visit: Payer: Self-pay | Admitting: Cardiovascular Disease

## 2019-08-04 ENCOUNTER — Telehealth: Payer: Medicare Other

## 2019-08-04 ENCOUNTER — Ambulatory Visit: Payer: Medicare Other

## 2019-08-05 DIAGNOSIS — H1712 Central corneal opacity, left eye: Secondary | ICD-10-CM | POA: Diagnosis not present

## 2019-08-05 DIAGNOSIS — H04123 Dry eye syndrome of bilateral lacrimal glands: Secondary | ICD-10-CM | POA: Diagnosis not present

## 2019-08-05 DIAGNOSIS — Z961 Presence of intraocular lens: Secondary | ICD-10-CM | POA: Diagnosis not present

## 2019-08-05 DIAGNOSIS — H43813 Vitreous degeneration, bilateral: Secondary | ICD-10-CM | POA: Diagnosis not present

## 2019-08-05 DIAGNOSIS — H353131 Nonexudative age-related macular degeneration, bilateral, early dry stage: Secondary | ICD-10-CM | POA: Diagnosis not present

## 2019-08-10 ENCOUNTER — Other Ambulatory Visit: Payer: Self-pay

## 2019-08-10 ENCOUNTER — Ambulatory Visit: Payer: Medicare Other

## 2019-08-10 ENCOUNTER — Ambulatory Visit (INDEPENDENT_AMBULATORY_CARE_PROVIDER_SITE_OTHER): Payer: Medicare Other | Admitting: General Practice

## 2019-08-10 DIAGNOSIS — I4891 Unspecified atrial fibrillation: Secondary | ICD-10-CM

## 2019-08-10 DIAGNOSIS — I10 Essential (primary) hypertension: Secondary | ICD-10-CM

## 2019-08-10 DIAGNOSIS — E785 Hyperlipidemia, unspecified: Secondary | ICD-10-CM

## 2019-08-10 DIAGNOSIS — Z7901 Long term (current) use of anticoagulants: Secondary | ICD-10-CM

## 2019-08-10 DIAGNOSIS — I5022 Chronic systolic (congestive) heart failure: Secondary | ICD-10-CM

## 2019-08-10 DIAGNOSIS — I872 Venous insufficiency (chronic) (peripheral): Secondary | ICD-10-CM

## 2019-08-10 LAB — POCT INR: INR: 2.7 (ref 2.0–3.0)

## 2019-08-10 NOTE — Patient Instructions (Addendum)
Visit Information  Goals Addressed            This Visit's Progress   . Pharmacy Care Plan       CARE PLAN ENTRY (see longitudinal plan of care for additional care plan information)  Current Barriers:  . Chronic Disease Management support, education, and care coordination needs related to Hypertension, Hyperlipidemia, Atrial Fibrillation, Heart Failure, and Edema   Hypertension BP Readings from Last 3 Encounters:  06/29/19 110/62  12/29/18 118/76  08/29/18 138/72   . Pharmacist Clinical Goal(s): o Over the next 90 days, patient will work with PharmD and providers to maintain BP goal <130/80 . Current regimen:  o Metoprolol tartrate 100mg , 1 tablet twice daily . Patient self care activities - Over the next 90 days, patient will: o Check BP as directed, document, and provide at future appointments o Ensure daily salt intake < 2300 mg/day  Hyperlipidemia Lab Results  Component Value Date/Time   LDLCALC 53 06/29/2019 09:13 AM   LDLDIRECT 142.0 12/16/2013 08:57 AM   . Pharmacist Clinical Goal(s): o Over the next 90 days, patient will work with PharmD and providers to maintain LDL goal < 100 . Current regimen:  o Atorvastatin 20mg , 1 tablet once daily . Interventions: o Recommend:  -  how a diet high in plant sterols (fruits/vegetables/nuts/whole grains/legumes) may reduce your cholesterol.  Encouraged increasing fiber to a daily intake of 10-25g/day  . Patient self care activities - Over the next 90 days, patient will: o Continue current medications as directed by provider.   Atrial fibrillation . Pharmacist Clinical Goal(s) o Over the next 90 days, patient will work with PharmD and providers to maintain INR: 2.0 to 3.0 . Current regimen:   Metoprolol tartrate 100mg , 1 tablet twice daily   Warfarin 5mg , as directed by anticoag clinic . Interventions: o We discussed:  - Indication of metoprolol and controlling heart rate  monitoring for signs and symptoms for  bleeding (coughing up blood, prolonged nose bleeds, black, tarry stools).  . Patient self care activities - Over the next 90 days, patient will: o Continue current medications as indicated and follow up in anti-coag clinic.  Chronic systolic heart failure/ Edema  . Pharmacist Clinical Goal(s) o Over the next 90 days, patient will work with PharmD and providers to maintain stable fluid status and prevent hospitalizations.  . Current regimen:   Furosemide 40mg , 1 tablet in the morning and 0.5 tablet in the evening  Potassium chloride 14/09/2013, 3 tablets once daily  Metoprolol tartrate 100mg , 1 tablet twice daily . Interventions:  Recommend: weighing daily; if you gain more than 3 pounds in one day or 5 pounds in one week call your doctor  Discussed indications of metoprolol . Patient self care activities o Patient will continue current medications as directed by providers.   Medication management . Pharmacist Clinical Goal(s): o Over the next 90 days, patient will work with PharmD and providers to maintain optimal medication adherence . Current pharmacy: [facility] . Interventions o Comprehensive medication review performed. o Continue current medication management strategy . Patient self care activities - Over the next 90 days, patient will: o Take medications as prescribed o Report any questions or concerns to PharmD and/or provider(s)  Initial goal documentation        Ms. Baisch was given information about Chronic Care Management services today including:  1. CCM service includes personalized support from designated clinical staff supervised by her physician, including individualized plan of care and coordination with other  care providers 2. 24/7 contact phone numbers for assistance for urgent and routine care needs. 3. Standard insurance, coinsurance, copays and deductibles apply for chronic care management only during months in which we provide at least 20 minutes of these  services. Most insurances cover these services at 100%, however patients may be responsible for any copay, coinsurance and/or deductible if applicable. This service may help you avoid the need for more expensive face-to-face services. 4. Only one practitioner may furnish and bill the service in a calendar month. 5. The patient may stop CCM services at any time (effective at the end of the month) by phone call to the office staff.  Patient agreed to services and verbal consent obtained.   The patient verbalized understanding of instructions provided today and agreed to receive a mailed copy of patient instruction and/or educational materials. The pharmacy team will reach out to the patient again over the next 90 days.   Laural Benes, PharmD Clinical Pharmacist [facility] [phone]    Edema  Edema is when you have too much fluid in your body or under your skin. Edema may make your legs, feet, and ankles swell up. Swelling is also common in looser tissues, like around your eyes. This is a common condition. It gets more common as you get older. There are many possible causes of edema. Eating too much salt (sodium) and being on your feet or sitting for a long time can cause edema in your legs, feet, and ankles. Hot weather may make edema worse. Edema is usually painless. Your skin may look swollen or shiny. Follow these instructions at home:  Keep the swollen body part raised (elevated) above the level of your heart when you are sitting or lying down.  Do not sit still or stand for a long time.  Do not wear tight clothes. Do not wear garters on your upper legs.  Exercise your legs. This can help the swelling go down.  Wear elastic bandages or support stockings as told by your doctor.  Eat a low-salt (low-sodium) diet to reduce fluid as told by your doctor.  Depending on the cause of your swelling, you may need to limit how much fluid you drink (fluid  restriction).  Take over-the-counter and prescription medicines only as told by your doctor. Contact a doctor if:  Treatment is not working.  You have heart, liver, or kidney disease and have symptoms of edema.  You have sudden and unexplained weight gain. Get help right away if:  You have shortness of breath or chest pain.  You cannot breathe when you lie down.  You have pain, redness, or warmth in the swollen areas.  You have heart, liver, or kidney disease and get edema all of a sudden.  You have a fever and your symptoms get worse all of a sudden. Summary  Edema is when you have too much fluid in your body or under your skin.  Edema may make your legs, feet, and ankles swell up. Swelling is also common in looser tissues, like around your eyes.  Raise (elevate) the swollen body part above the level of your heart when you are sitting or lying down.  Follow your doctor's instructions about diet and how much fluid you can drink (fluid restriction). This information is not intended to replace advice given to you by your health care provider. Make sure you discuss any questions you have with your health care provider. Document Revised: 12/28/2016 Document Reviewed:  01/13/2016 Elsevier Patient Education  [place].

## 2019-08-10 NOTE — Chronic Care Management (AMB) (Signed)
Chronic Care Management Pharmacy  Name: [name]  MRN: [id number] DOB: [birth year]  Chief Complaint/ HPI  [place],  86 y.o. , female presents for their Initial CCM visit with the clinical pharmacist via telephone due to COVID-19 Pandemic.  Patient reports living by herself. States it was recommended for her to use a cane, but she refuses to use one. She states she is takes her time when walking and will pause for a few seconds before taking first steps. She reports feeling tired and was told metoprolol was the reason she feels tired. She inquired indication of metoprolol.  Patient also reported having scale but does not weight herself as she wants to "avoid falling". Patient reports edema has improved and she is also wearing compression socks.     Patient refuses need for caretaker as she states she does not want people with her and concerned with COVID.   She reports being active (walks in the morning with her son) and goes grocery shopping at 6:00 AM to avoid crowds. She reports having routine going to [place]  PCP : Eulas Post, MD  Their chronic conditions include: HTN, atrial fibrillation, systolic HF, edema due to peripheral venous insufficiency, HLD  Office Visits: 06/29/2019- Carolann Littler, MD- Patient presented for office visit for 6 month follow up. For peripheral edema, recommended increasing furosemide to 33m BID for 5 days then decrease back to 40 and 20. For GERD, suggested famotidine 210mBID. Patient to obtain labs: BMET, lipid panel. Patient to follow up in 6 months.   06/29/2019- Anticoag visit- CyMeriam SpragueRN- Patient presented for anticoag visit. INR: 2.4. Patient to continue 2.68m12mvery Mon, Wed, Fri; 68mg59ml other days.   Consult Visit: 07/10/2019- Podiatry- AdamLanae CrumblyM- Patient presented for office visit for foot problem (left ankle hurting and numb on the instep). Patient educated on stretching and icing for plantar fascitis and  peroneal tendinitis. Patient received injection to left plantar fashion.   Medications: Outpatient Encounter Medications as of 08/10/2019  Medication Sig  . atorvastatin (LIPITOR) 20 MG tablet Take 1 tablet by mouth once daily  . furosemide (LASIX) 40 MG tablet TAKE 1 TABLET BY MOUTH IN  THE MORNING AND 1/2 TABLET  IN THE EVENING  . metoprolol tartrate (LOPRESSOR) 100 MG tablet TAKE 1 TABLET BY MOUTH TWICE DAILY . APPOINTMENT REQUIRED FOR FUTURE REFILLS  . Multiple Vitamin (MULTIVITAMIN) tablet Take 1 tablet by mouth daily.    . potassium chloride (KLOR-CON) 10 MEQ tablet Take 3 tablets by mouth once daily  . warfarin (COUMADIN) 5 MG tablet TAKE 1 TABLET BY MOUTH DAILY EXCEPT TAKE 1/2 TABLET ON MONDAY, WEDNESDAY, AND FRIDAY OR TAKE AS DIRECTED BY ANTICOAGULATION CLINIC  . azelastine (OPTIVAR) 0.05 % ophthalmic solution INSTILL 1 TO 2 DROPS INTO EACH EYE ONCE DAILY AS NEEDED FOR ALLERGIES  . cromolyn (OPTICROM) 4 % ophthalmic solution INSTILL 1 DROP INTO EACH EYE TWICE DAILY AS DIRECTED AS NEEDED FOR ALLERGIES  . Ferrous Gluconate (IRON 27 PO) Take 1 tablet by mouth daily.  . hydrocortisone cream 0.5 % Apply 1 application topically 2 (two) times daily.  . loMarland Kitchenatadine (CLARITIN) 10 MG tablet Take 10 mg by mouth daily.     No facility-administered encounter medications on file as of 08/10/2019.     Current Diagnosis/Assessment:  Goals Addressed            This Visit's Progress   . Pharmacy Care Plan       CARE PLAN ENTRY (see  longitudinal plan of care for additional care plan information)  Current Barriers:  . Chronic Disease Management support, education, and care coordination needs related to Hypertension, Hyperlipidemia, Atrial Fibrillation, Heart Failure, and Edema   Hypertension BP Readings from Last 3 Encounters:  06/29/19 110/62  12/29/18 118/76  08/29/18 138/72   . Pharmacist Clinical Goal(s): o Over the next 90 days, patient will work with PharmD and providers to maintain  BP goal <130/80 . Current regimen:  o Metoprolol tartrate 112m, 1 tablet twice daily . Patient self care activities - Over the next 90 days, patient will: o Check BP as directed, document, and provide at future appointments o Ensure daily salt intake < 2300 mg/day  Hyperlipidemia Lab Results  Component Value Date/Time   LDLCALC 53 06/29/2019 09:13 AM   LDLDIRECT 142.0 12/16/2013 08:57 AM   . Pharmacist Clinical Goal(s): o Over the next 90 days, patient will work with PharmD and providers to maintain LDL goal < 100 . Current regimen:  o Atorvastatin 215m 1 tablet once daily . Interventions: o Recommend:  -  how a diet high in plant sterols (fruits/vegetables/nuts/whole grains/legumes) may reduce your cholesterol.  Encouraged increasing fiber to a daily intake of 10-25g/day  . Patient self care activities - Over the next 90 days, patient will: o Continue current medications as directed by provider.   Atrial fibrillation . Pharmacist Clinical Goal(s) o Over the next 90 days, patient will work with PharmD and providers to maintain INR: 2.0 to 3.0 . Current regimen:   Metoprolol tartrate 10064m1 tablet twice daily   Warfarin 5mg73ms directed by anticoag clinic . Interventions: o We discussed:  - Indication of metoprolol and controlling heart rate  monitoring for signs and symptoms for bleeding (coughing up blood, prolonged nose bleeds, black, tarry stools).  . Patient self care activities - Over the next 90 days, patient will: o Continue current medications as indicated and follow up in anti-coag clinic.  Chronic systolic heart failure/ Edema  . Pharmacist Clinical Goal(s) o Over the next 90 days, patient will work with PharmD and providers to maintain stable fluid status and prevent hospitalizations.  . Current regimen:   Furosemide 40mg57mtablet in the morning and 0.5 tablet in the evening  Potassium chloride 10mEq44mtablets once daily  Metoprolol tartrate 100mg, 15mablet twice daily . Interventions:  Recommend: weighing daily; if you gain more than 3 pounds in one day or 5 pounds in one week call your doctor  Discussed indications of metoprolol . Patient self care activities o Patient will continue current medications as directed by providers.   Medication management . Pharmacist Clinical Goal(s): o Over the next 90 days, patient will work with PharmD and providers to maintain optimal medication adherence . Current pharmacy: [facility] . Interventions o Comprehensive medication review performed. o Continue current medication management strategy . Patient self care activities - Over the next 90 days, patient will: o Take medications as prescribed o Report any questions or concerns to PharmD and/or provider(s)  Initial goal documentation        SDOH Interventions     Most Recent Value  SDOH Interventions  Transportation Interventions Patient Refused  [PatienAlto Denvers son helps transportaiton,  patient refused SCAT services as "wants  to avoid public"]       AFIB   This patients CHA2DS2-VASc Score and unadjusted Ischemic Stroke Rate (% per year) is equal to 9.7 % stroke rate/year from a score of 6  Above score  calculated as 1 point each if present [CHF, HTN, DM, Vascular=MI/PAD/Aortic Plaque, Age if 65-74, or Female] Above score calculated as 2 points each if present [Age > 75, or Stroke/TIA/TE]  CHA2DS2/VAS Stroke Risk Points  Current as of 11 minutes ago     6 >= 2 Points: High Risk  1 - 1.99 Points: Medium Risk  0 Points: Low Risk    Last Change: N/A    INR/Prothrombin Time INR  Date Value Ref Range Status  08/10/2019 2.7 2.0 - 3.0 Final   Patient is currently rate controlled   Patient has failed these meds in past: spironolactone (chest pain)   Patient is currently controlled on the following medications:   Metoprolol tartrate 174m, 1 tablet twice daily (appropriate, effective, safe)  Warfarin 59m as directed by  anticoag clinic (appropriate, effective, safe)  We discussed:    monitoring for signs and symptoms for bleeding (coughing up blood, prolonged nose bleeds, black, tarry stools).   Indication of metoprolol  Plan Continue current medications  Hypertension   Denies persistent dizziness/ lightheadedness/ orthostatic hypotension.   Office blood pressures are  BP Readings from Last 3 Encounters:  06/29/19 110/62  12/29/18 118/76  08/29/18 138/72   Patient has failed these meds in the past: spironolactone (chest pain)   Patient checks BP at home N/A  Patient home BP readings are ranging: N/A  Patient is controlled on:   Metoprolol tartrate 10053m1 tablet twice daily (appropriate, effective, safe)  Plan Continue current medications     Hyperlipidemia   LDL goal < 100  Lipid Panel     Component Value Date/Time   CHOL 131 06/29/2019 0913   TRIG 107.0 06/29/2019 0913   HDL 56.20 06/29/2019 0913   LDLCALC 53 06/29/2019 0913   LDLDIRECT 142.0 12/16/2013 0857    Hepatic Function Latest Ref Rng & Units 06/29/2019 08/29/2018 10/04/2017  Total Protein 6.0 - 8.3 g/dL 6.2 7.0 6.4  Albumin 3.5 - 5.2 g/dL 4.0 4.5 4.0  AST 0 - 37 U/L _0 ALT 0 - 35 U/L _1 Alk Phosphatase 39 - 117 U/L 69 83 96  Total Bilirubin 0.2 - 1.2 mg/dL 1.6(H) 1.2 1.2  Bilirubin, Direct 0.0 - 0.3 mg/dL 0.3 0.3 0.3     The ASCVD Risk score (GoMikey Bussing Jr., et al., 2013) failed to calculate for the following reasons:   The 2013 ASCVD risk score is only valid for ages 40 16 79 27Patient has failed these meds in past: rosuvastatin (abdominal cramping/ diarrhea)   Patient is currently controlled on the following medications:  . Atorvastatin 69m41m tablet once daily  (appropriate, effective, safe)  We discussed:  diet and exercise extensively  - recommend: how a diet high in plant sterols (fruits/vegetables/nuts/whole grains/legumes) may reduce your cholesterol.  Encouraged increasing fiber to a  daily intake of 10-25g/day    Plan Continue current medications    Heart Failure   Type: Systolic  Last ejection fraction: LV EF: 20% (03/26/2012)  BMP Latest Ref Rng & Units 06/29/2019 08/29/2018 01/07/2018  BUN 6 - 23 mg/dL _2 Creatinine 0.40 - 1.20 mg/dL 0.96 0.98 0.96  Potassium 3.5 - 5.1 mEq/L 4.1 3.7 4.0   Patient is currently controlled on the following medications:   Furosemide 40mg10mtablet in the morning and 0.5 tablet in the evening(appropriate, effective, safe)  Potassium chloride 10mEq47mtablets once daily(appropriate, effective, safe)  Metoprolol tartrate 100mg, 32mblet twice  daily (appropriate, effective, safe)  We discussed  weighing daily; if you gain more than 3 pounds in one day or 5 pounds in one week call your doctor  Indication of metoprolol  Plan Continue current medications  Allergic rhinitis (query)   Patient on the following medications per medication list:  . Loratadine 43m,1 tablet once daily   Unable to assess.   Plan Reassess at follow up   Vaccines   Reviewed and discussed patient's vaccination history.    Immunization History  Administered Date(s) Administered  . Fluad Quad(high Dose 65+) 08/29/2018  . Influenza Split 12/01/2010, 09/21/2011  . Influenza Whole 10/08/2008, 09/23/2009  . Influenza, High Dose Seasonal PF 09/29/2014, 10/11/2016, 09/16/2017  . Influenza,inj,Quad PF,6+ Mos 09/15/2012, 10/07/2013  . Influenza-Unspecified 10/24/2015, 10/11/2016  . Pneumococcal Conjugate-13 12/16/2013  . Pneumococcal Polysaccharide-23 01/08/2006  . Td 01/09/2003    Plan Recommended patient receive tetanus and shingles vaccine in office and pharmacy.    Medication Management  Patient organizes medications: reassess at follow up Primary pharmacy: [facility] Adherence: no gaps in refill history (per medication dispense history (from 2/32021 to 08/10/2019)     Follow up Follow up visit with PharmD in 6 months.   AAnson Crofts PharmD Clinical Pharmacist [facility]([phone]

## 2019-08-10 NOTE — Patient Instructions (Addendum)
Pre visit review using our clinic review tool, if applicable. No additional management support is needed unless otherwise documented below in the visit note.  Take 1 tablet all days except 1/2 tablet on Monday Wednesday and Fridays.  Re-check in 6 weeks.

## 2019-08-16 ENCOUNTER — Other Ambulatory Visit: Payer: Self-pay | Admitting: Family Medicine

## 2019-08-17 NOTE — Telephone Encounter (Signed)
Please advise 

## 2019-08-26 ENCOUNTER — Other Ambulatory Visit: Payer: Self-pay | Admitting: Cardiovascular Disease

## 2019-08-28 ENCOUNTER — Other Ambulatory Visit: Payer: Self-pay | Admitting: Family Medicine

## 2019-09-21 ENCOUNTER — Other Ambulatory Visit: Payer: Self-pay

## 2019-09-21 ENCOUNTER — Ambulatory Visit (INDEPENDENT_AMBULATORY_CARE_PROVIDER_SITE_OTHER): Payer: Medicare Other | Admitting: General Practice

## 2019-09-21 DIAGNOSIS — I4891 Unspecified atrial fibrillation: Secondary | ICD-10-CM

## 2019-09-21 DIAGNOSIS — Z7901 Long term (current) use of anticoagulants: Secondary | ICD-10-CM

## 2019-09-21 LAB — POCT INR: INR: 1.8 — AB (ref 2.0–3.0)

## 2019-09-21 NOTE — Patient Instructions (Signed)
Pre visit review using our clinic review tool, if applicable. No additional management support is needed unless otherwise documented below in the visit note.  Take extra 1/2 tablet today and then continue to take 1 tablet all days except 1/2 tablet on Monday Wednesday and Fridays.  Re-check in 6 weeks.

## 2019-09-25 ENCOUNTER — Other Ambulatory Visit: Payer: Self-pay | Admitting: Cardiovascular Disease

## 2019-09-27 ENCOUNTER — Other Ambulatory Visit: Payer: Self-pay | Admitting: Cardiovascular Disease

## 2019-10-22 ENCOUNTER — Other Ambulatory Visit: Payer: Self-pay | Admitting: Family Medicine

## 2019-10-28 ENCOUNTER — Other Ambulatory Visit: Payer: Self-pay

## 2019-10-28 ENCOUNTER — Other Ambulatory Visit: Payer: Self-pay | Admitting: Cardiovascular Disease

## 2019-10-28 MED ORDER — ATORVASTATIN CALCIUM 20 MG PO TABS: 20.0000 mg | ORAL_TABLET | Freq: Every day | ORAL | 1 refills | Status: DC

## 2019-10-28 MED ORDER — POTASSIUM CHLORIDE CRYS ER 10 MEQ PO TBCR: 30.0000 meq | EXTENDED_RELEASE_TABLET | Freq: Every day | ORAL | 0 refills | Status: DC

## 2019-10-28 MED ORDER — WARFARIN SODIUM 5 MG PO TABS: ORAL_TABLET | ORAL | 0 refills | Status: DC

## 2019-10-29 ENCOUNTER — Other Ambulatory Visit: Payer: Self-pay | Admitting: Cardiovascular Disease

## 2019-10-30 ENCOUNTER — Other Ambulatory Visit: Payer: Self-pay | Admitting: Cardiovascular Disease

## 2019-10-30 ENCOUNTER — Other Ambulatory Visit: Payer: Self-pay

## 2019-11-02 ENCOUNTER — Other Ambulatory Visit: Payer: Self-pay

## 2019-11-02 ENCOUNTER — Ambulatory Visit (INDEPENDENT_AMBULATORY_CARE_PROVIDER_SITE_OTHER): Payer: Medicare Other | Admitting: General Practice

## 2019-11-02 DIAGNOSIS — Z7901 Long term (current) use of anticoagulants: Secondary | ICD-10-CM

## 2019-11-02 LAB — POCT INR: INR: 3 (ref 2.0–3.0)

## 2019-11-02 NOTE — Patient Instructions (Addendum)
Pre visit review using our clinic review tool, if applicable. No additional management support is needed unless otherwise documented below in the visit note.  Skip dosage today and then continue to take 1 tablet all days except 1/2 tablet on Monday Wednesday and Fridays.  Re-check in 6 weeks.

## 2019-11-05 ENCOUNTER — Other Ambulatory Visit: Payer: Self-pay

## 2019-11-05 ENCOUNTER — Ambulatory Visit: Payer: Medicare Other | Admitting: Podiatry

## 2019-11-05 DIAGNOSIS — I83892 Varicose veins of left lower extremities with other complications: Secondary | ICD-10-CM

## 2019-11-05 DIAGNOSIS — M722 Plantar fascial fibromatosis: Secondary | ICD-10-CM | POA: Diagnosis not present

## 2019-11-05 NOTE — Progress Notes (Signed)
  Subjective:  Patient ID: [name], female    DOB: Jun 01, 1926,  MRN: [id number]  Chief Complaint  Patient presents with  . Foot Pain    PT stated that she is still having swelling and her foot is still sore     86 y.o. female presents with the above complaint. History confirmed with patient.  She is here today again with her son.  The injection several months ago was helpful but has worn off since then.  The ankle compression sleeve currently has been helpful.  She has a significant amount of new swelling the left leg today.  This is been painful in the back of the calf and the knee.  Her son is concerned about a blood clot in the leg.  Objective:  Physical Exam: warm, good capillary refill and venous insufficiency noted. Left Foot: point tenderness over the heel pad, as well as the peroneal tendons, today she has worsening asymmetric edema in the left leg greater than right leg and is painful to palpation the posterior calf and knee   Assessment:   1. Varicose veins of leg with swelling, left   2. Plantar fasciitis      Plan:  Patient was evaluated and treated and all questions answered.  Plantar Fasciitis and peroneal tendinitis -Educated patient on stretching and icing of the affected limb -Following sterile prep with alcohol, injection was delivered to the left plantar fashion at the insertion of the calcaneal tuber.  The injection consisted of 1 cc 0.5% Marcaine plain, 1 cc of dexamethasone phosphate 4 mg/mL, and 0.5 cc of Kenalog 10 -Today given her continued pain along the peroneals and the Achilles insertion, recommend we increase her support with immobilization in a Tri-Lock ankle brace as this will be give her more support than the current ankle compression,.  Concerning the new left leg swelling and pain in the posterior calf and knee, considering her history of venous disease I concerned that she could be at risk for a blood clot or phlebitis, I recommended they  schedule an ASAP appointment for a stat ultrasound and an order for this was placed to the [facility].  Also recommended discussed with her PCP in case this is worsening of her heart failure or renal disease.   Sharl Ma, DPM 11/05/2019      Return in about 2 months (around 01/05/2020).

## 2019-11-05 NOTE — Patient Instructions (Addendum)
Discuss the new leg swelling and pain with your primary care doctor   Schedule an appointment for the ultrasound ASAP at [phone]  [facility] [address] [place],  [place]  [id number]

## 2019-11-10 DIAGNOSIS — M79641 Pain in right hand: Secondary | ICD-10-CM | POA: Diagnosis not present

## 2019-11-10 DIAGNOSIS — T148XXA Other injury of unspecified body region, initial encounter: Secondary | ICD-10-CM | POA: Diagnosis not present

## 2019-11-10 DIAGNOSIS — W19XXXA Unspecified fall, initial encounter: Secondary | ICD-10-CM | POA: Diagnosis not present

## 2019-11-10 DIAGNOSIS — Z9229 Personal history of other drug therapy: Secondary | ICD-10-CM | POA: Diagnosis not present

## 2019-11-11 ENCOUNTER — Ambulatory Visit (HOSPITAL_COMMUNITY)
Admission: RE | Admit: 2019-11-11 | Payer: Medicare Other | Source: Ambulatory Visit | Attending: Podiatry | Admitting: Podiatry

## 2019-11-11 DIAGNOSIS — S52501A Unspecified fracture of the lower end of right radius, initial encounter for closed fracture: Secondary | ICD-10-CM | POA: Diagnosis not present

## 2019-11-18 ENCOUNTER — Ambulatory Visit: Payer: Medicare Other

## 2019-11-18 DIAGNOSIS — S52501D Unspecified fracture of the lower end of right radius, subsequent encounter for closed fracture with routine healing: Secondary | ICD-10-CM | POA: Diagnosis not present

## 2019-11-23 NOTE — Progress Notes (Signed)
[facility] that patient is taking warfarin 2.5mg  every Monday, Wednesday and Friday, then 5mg  all other days.

## 2019-11-24 ENCOUNTER — Ambulatory Visit (INDEPENDENT_AMBULATORY_CARE_PROVIDER_SITE_OTHER): Payer: Medicare Other

## 2019-11-24 ENCOUNTER — Telehealth: Payer: Self-pay | Admitting: Family Medicine

## 2019-11-24 DIAGNOSIS — Z9181 History of falling: Secondary | ICD-10-CM

## 2019-11-24 DIAGNOSIS — Z0000 Encounter for general adult medical examination without abnormal findings: Secondary | ICD-10-CM | POA: Diagnosis not present

## 2019-11-24 NOTE — Progress Notes (Signed)
Subjective:   [name] is a 86 y.o. female who presents for Medicare Annual (Subsequent) preventive examination.  I connected with [name] today by telephone and verified that I am speaking with the correct person using two identifiers. Location patient: home Location provider: work Persons participating in the virtual visit: patient, provider.   I discussed the limitations, risks, security and privacy concerns of performing an evaluation and management service by telephone and the availability of in person appointments. I also discussed with the patient that there may be a patient responsible charge related to this service. The patient expressed understanding and verbally consented to this telephonic visit.    Interactive audio and video telecommunications were attempted between this provider and patient, however failed, due to patient having technical difficulties OR patient did not have access to video capability.  We continued and completed visit with audio only.     Review of Systems    N/A  Cardiac Risk Factors include: advanced age (>74men, >99 women);hypertension;dyslipidemia     Objective:    There were no vitals filed for this visit. There is no height or weight on file to calculate BMI.  Advanced Directives 11/24/2019 05/27/2011  Does Patient Have a Medical Advance Directive? No Patient does not have advance directive  Would patient like information on creating a medical advance directive? No - Patient declined -    Current Medications (verified) Outpatient Encounter Medications as of 11/24/2019  Medication Sig  . Ferrous Gluconate (IRON 27 PO) Take 1 tablet by mouth daily.  . furosemide (LASIX) 40 MG tablet TAKE 1 TABLET BY MOUTH IN  THE MORNING AND 1/2 TABLET  IN THE EVENING  . hydrocortisone cream 0.5 % Apply 1 application topically 2 (two) times daily.  Marland Kitchen loratadine (CLARITIN) 10 MG tablet Take 10 mg by mouth daily.    . metoprolol tartrate  (LOPRESSOR) 100 MG tablet Take 1 tablet by mouth twice daily  . Multiple Vitamin (MULTIVITAMIN) tablet Take 1 tablet by mouth daily.    . potassium chloride (KLOR-CON) 10 MEQ tablet Take 3 tablets (30 mEq total) by mouth daily.  . potassium chloride (KLOR-CON) 10 MEQ tablet Take by mouth.  . sodium fluoride (FLUORISHIELD) 1.1 % GEL dental gel   . warfarin (COUMADIN) 5 MG tablet Take as directed by coumadin clinic  . atorvastatin (LIPITOR) 20 MG tablet Take 1 tablet (20 mg total) by mouth daily. (Patient not taking: Reported on 11/24/2019)  . azelastine (OPTIVAR) 0.05 % ophthalmic solution INSTILL 1 TO 2 DROPS INTO EACH EYE ONCE DAILY AS NEEDED FOR ALLERGIES (Patient not taking: Reported on 11/24/2019)  . cromolyn (OPTICROM) 4 % ophthalmic solution INSTILL 1 DROP INTO EACH EYE TWICE DAILY AS DIRECTED AS NEEDED FOR ALLERGIES (Patient not taking: Reported on 11/24/2019)   No facility-administered encounter medications on file as of 11/24/2019.    Allergies (verified) Gabapentin, Bactrim, Codeine, Contrast media [iodinated diagnostic agents], Nitrofurantoin, Nitrofurantoin monohyd macro, Penicillins, and Sulfamethoxazole-trimethoprim   History: Past Medical History:  Diagnosis Date  . Atrial fib/flutter, transient   . B12 DEFICIENCY 11/25/2009  . CHF 01/27/2009  . Chronic systolic heart failure (HCC) 07/25/2009   a. Myoview 5/12:  Low risk, no ischemia, apical lateral defect probably breast attenuation, inf HK, EF 45%.  b. Echo 8/12: EF 45-50%, inf/inf-septal and apical HK, mild LVH, mild to mod MR, mild to mod TR, PASP 50;  c.  Echo 3/14:  Mild LVH, EF 20%, diff HK, mild to mod MR, mild LAE, mild  to mod reduced RVSF, mild RAE, mild to mod TR, PASP 41  . DIVERTICULITIS OF COLON 01/27/2009  . ESSENTIAL HYPERTENSION 09/30/2009  . Hemorrhoids   . Hypertension   . Left ventricular dysfunction   . OSTEOARTHRITIS, KNEE, RIGHT 02/08/2009  . OSTEOPENIA 01/27/2009  . Rectal bleeding   . SMALL BOWEL  OBSTRUCTION 05/05/2008   Past Surgical History:  Procedure Laterality Date  . ABDOMINAL HYSTERECTOMY  1996   TAHBSO  . APPENDECTOMY  1951  . CARDIAC CATHETERIZATION  April 14, 2012   nonobstructive CAD; severe LV dysfunction.   . COLON SURGERY  2006   Sigmoid colectomy for diverticulitis  . TONSILLECTOMY  1966   Family History  Problem Relation Age of Onset  . Heart disease Father   . Heart disease Sister   . Heart disease Other   . Emphysema Other    Social History   Socioeconomic History  . Marital status: Widowed    Spouse name: Not on file  . Number of children: Not on file  . Years of education: Not on file  . Highest education level: Not on file  Occupational History  . Not on file  Tobacco Use  . Smoking status: Never Smoker  . Smokeless tobacco: Never Used  Vaping Use  . Vaping Use: Never used  Substance and Sexual Activity  . Alcohol use: No  . Drug use: No  . Sexual activity: Not Currently  Other Topics Concern  . Not on file  Social History Narrative  . Not on file   Social Determinants of Health   Financial Resource Strain: Low Risk   . Difficulty of Paying Living Expenses: Not hard at all  Food Insecurity: No Food Insecurity  . Worried About Programme researcher, broadcasting/film/videounning Out of Food in the Last Year: Never true  . Ran Out of Food in the Last Year: Never true  Transportation Needs: No Transportation Needs  . Lack of Transportation (Medical): No  . Lack of Transportation (Non-Medical): No  Physical Activity: Inactive  . Days of Exercise per Week: 0 days  . Minutes of Exercise per Session: 0 min  Stress: No Stress Concern Present  . Feeling of Stress : Not at all  Social Connections: Socially Isolated  . Frequency of Communication with Friends and Family: More than three times a week  . Frequency of Social Gatherings with Friends and Family: More than three times a week  . Attends Religious Services: Never  . Active Member of Clubs or Organizations: No  . Attends  BankerClub or Organization Meetings: Never  . Marital Status: Divorced    Tobacco Counseling Counseling given: Not Answered   Clinical Intake:  Pre-visit preparation completed: Yes  Pain : No/denies pain     Nutritional Risks: None Diabetes: No  How often do you need to have someone help you when you read instructions, pamphlets, or other written materials from your doctor or pharmacy?: 1 - Never What is the last grade level you completed in school?: High School  Diabetic?No   Interpreter Needed?: No  Information entered by :: SCrews,LPN   Activities of Daily Living In your present state of health, do you have any difficulty performing the following activities: 11/24/2019  Hearing? N  Vision? N  Difficulty concentrating or making decisions? Y  Comment has issues with short term memory  Walking or climbing stairs? Y  Dressing or bathing? Y  Doing errands, shopping? Y  Preparing Food and eating ? Y  Using the Toilet? YJeannie Fend  In the past six months, have you accidently leaked urine? N  Do you have problems with loss of bowel control? N  Managing your Medications? Y  Managing your Finances? Y  Housekeeping or managing your Housekeeping? Y  Some recent data might be hidden    Patient Care Team: [name], MD as PCP - General (Family Medicine) Nahser, Deloris Ping, MD as PCP - Cardiology (Cardiology) Lutricia Feil, [facility] as Pharmacist (Pharmacist)  Indicate any recent Medical Services you may have received from other than [facility] providers in the past year (date may be approximate).     Assessment:   This is a routine wellness examination for [place].  Hearing/Vision screen  Hearing Screening   125Hz  250Hz  500Hz  1000Hz  2000Hz  3000Hz  4000Hz  6000Hz  8000Hz   Right ear:           Left ear:           Vision Screening Comments: Patient states gets eyes examined once per year.   Dietary issues and exercise activities discussed: Current Exercise Habits: The patient  does not participate in regular exercise at present, Exercise limited by: cardiac condition(s);orthopedic condition(s)  Goals    . Exercise 3x per week (30 min per time)    . Pharmacy Care Plan     CARE PLAN ENTRY (see longitudinal plan of care for additional care plan information)  Current Barriers:  . Chronic Disease Management support, education, and care coordination needs related to Hypertension, Hyperlipidemia, Atrial Fibrillation, Heart Failure, and Edema   Hypertension BP Readings from Last 3 Encounters:  06/29/19 110/62  12/29/18 118/76  08/29/18 138/72   . Pharmacist Clinical Goal(s): o Over the next 90 days, patient will work with PharmD and providers to maintain BP goal <130/80 . Current regimen:  o Metoprolol tartrate 100mg , 1 tablet twice daily . Patient self care activities - Over the next 90 days, patient will: o Check BP as directed, document, and provide at future appointments o Ensure daily salt intake < 2300 mg/day  Hyperlipidemia Lab Results  Component Value Date/Time   LDLCALC 53 06/29/2019 09:13 AM   LDLDIRECT 142.0 12/16/2013 08:57 AM   . Pharmacist Clinical Goal(s): o Over the next 90 days, patient will work with PharmD and providers to maintain LDL goal < 100 . Current regimen:  o Atorvastatin 20mg , 1 tablet once daily . Interventions: o Recommend:  -  how a diet high in plant sterols (fruits/vegetables/nuts/whole grains/legumes) may reduce your cholesterol.  Encouraged increasing fiber to a daily intake of 10-25g/day  . Patient self care activities - Over the next 90 days, patient will: o Continue current medications as directed by provider.   Atrial fibrillation . Pharmacist Clinical Goal(s) o Over the next 90 days, patient will work with PharmD and providers to maintain INR: 2.0 to 3.0 . Current regimen:   Metoprolol tartrate 100mg , 1 tablet twice daily   Warfarin 5mg , as directed by anticoag clinic . Interventions: o We discussed:   - Indication of metoprolol and controlling heart rate  monitoring for signs and symptoms for bleeding (coughing up blood, prolonged nose bleeds, black, tarry stools).  . Patient self care activities - Over the next 90 days, patient will: o Continue current medications as indicated and follow up in anti-coag clinic.  Chronic systolic heart failure/ Edema  . Pharmacist Clinical Goal(s) o Over the next 90 days, patient will work with PharmD and providers to maintain stable fluid status and prevent hospitalizations.  . Current regimen:   Furosemide 40mg ,  1 tablet in the morning and 0.5 tablet in the evening  Potassium chloride , 3 tablets once daily  Metoprolol tartrate 100mg , 1 tablet twice daily . Interventions:  Recommend: weighing daily; if you gain more than 3 pounds in one day or 5 pounds in one week call your doctor  Discussed indications of metoprolol . Patient self care activities o Patient will continue current medications as directed by providers.   Medication management . Pharmacist Clinical Goal(s): o Over the next 90 days, patient will work with PharmD and providers to maintain optimal medication adherence . Current pharmacy: [facility] . Interventions o Comprehensive medication review performed. o Continue current medication management strategy . Patient self care activities - Over the next 90 days, patient will: o Take medications as prescribed o Report any questions or concerns to PharmD and/or provider(s)  Initial goal documentation       Depression Screen PHQ 2/9 Scores 11/24/2019 12/29/2018 10/04/2017 12/26/2015 07/30/2014 05/27/2013  PHQ - 2 Score 0 0 0 0 0 0  PHQ- 9 Score 0 - - - - -    Fall Risk Fall Risk  11/24/2019 10/04/2017 12/26/2015 07/30/2014 05/27/2013  Falls in the past year? 1 Yes Yes No No  Number falls in past yr: 1 1 2  or more - -  Injury with Fall? 1 Yes - - -  Comment fractured wrist - - - -  Risk for fall due to : Medication side  effect;Impaired balance/gait;Impaired mobility - - - -  Follow up Falls evaluation completed;Falls prevention discussed - Falls evaluation completed;Education provided - -    Any stairs in or around the home? Yes  If so, are there any without handrails? No  Home free of loose throw rugs in walkways, pet beds, electrical cords, etc? Yes  Adequate lighting in your home to reduce risk of falls? Yes   ASSISTIVE DEVICES UTILIZED TO PREVENT FALLS:  Life alert? No  Use of a cane, walker or w/c? No  Grab bars in the bathroom? No  Shower chair or bench in shower? No  Elevated toilet seat or a handicapped toilet? No   Cognitive Function:  Cognition in tact based on direct observation. Offered cognitive screening due to patient reporting some short term memory loss and patient declined.      Immunizations Immunization History  Administered Date(s) Administered  . Fluad Quad(high Dose 65+) 08/29/2018  . Influenza Split 12/01/2010, 09/21/2011  . Influenza Whole 10/08/2008, 09/23/2009  . Influenza, High Dose Seasonal PF 09/29/2014, 10/11/2016, 09/16/2017  . Influenza,inj,Quad PF,6+ Mos 09/15/2012, 10/07/2013  . Influenza-Unspecified 10/24/2015, 10/11/2016  . Pneumococcal Conjugate-13 12/16/2013  . Pneumococcal Polysaccharide-23 01/08/2006  . Td 01/09/2003     TDAP status: Due, Education has been provided regarding the importance of this vaccine. Advised may receive this vaccine at local pharmacy or Health Dept. Aware to provide a copy of the vaccination record if obtained from local pharmacy or Health Dept. Verbalized acceptance and understanding. Flu Vaccine status: Declined, Education has been provided regarding the importance of this vaccine but patient still declined. Advised may receive this vaccine at local pharmacy or Health Dept. Aware to provide a copy of the vaccination record if obtained from local pharmacy or Health Dept. Verbalized acceptance and understanding. Pneumococcal  vaccine status: Up to date Covid-19 vaccine status: Declined, Education has been provided regarding the importance of this vaccine but patient still declined. Advised may receive this vaccine at local pharmacy or Health Dept.or vaccine clinic. Aware to provide a copy of  the vaccination record if obtained from local pharmacy or Health Dept. Verbalized acceptance and understanding.  Qualifies for Shingles Vaccine? Yes   Zostavax completed No   Shingrix Completed?: No.    Education has been provided regarding the importance of this vaccine. Patient has been advised to call insurance company to determine out of pocket expense if they have not yet received this vaccine. Advised may also receive vaccine at local pharmacy or Health Dept. Verbalized acceptance and understanding.  Screening Tests Health Maintenance  Topic Date Due  . COVID-19 Vaccine (1) Never done  . TETANUS/TDAP  01/08/2013  . INFLUENZA VACCINE  08/09/2019  . DEXA SCAN  Completed  . PNA vac Low Risk Adult  Completed    Health Maintenance  Health Maintenance Due  Topic Date Due  . COVID-19 Vaccine (1) Never done  . TETANUS/TDAP  01/08/2013  . INFLUENZA VACCINE  08/09/2019    Colorectal cancer screening: No longer required.  Mammogram status: No longer required.  Bone Density Status: No longer required   Lung Cancer Screening: (Low Dose CT Chest recommended if Age 30-80 years, 30 pack-year currently smoking OR have quit w/in 15years.) does not qualify.   Lung Cancer Screening Referral: N/A   Additional Screening:  Hepatitis C Screening: does not qualify;   Vision Screening: Recommended annual ophthalmology exams for early detection of glaucoma and other disorders of the eye. Is the patient up to date with their annual eye exam?  Yes  Who is the provider or what is the name of the office in which the patient attends annual eye exams? Dr. Hazle Quant If pt is not established with a provider, would they like to be referred to  a provider to establish care? No .   Dental Screening: Recommended annual dental exams for proper oral hygiene  Community Resource Referral / Chronic Care Management: CRR required this visit?  No   CCM required this visit?  No      Plan:     I have personally reviewed and noted the following in the patient's chart:   . Medical and social history . Use of alcohol, tobacco or illicit drugs  . Current medications and supplements . Functional ability and status . Nutritional status . Physical activity . Advanced directives . List of other physicians . Hospitalizations, surgeries, and ER visits in previous 12 months . Vitals . Screenings to include cognitive, depression, and falls . Referrals and appointments  In addition, I have reviewed and discussed with patient certain preventive protocols, quality metrics, and best practice recommendations. A written personalized care plan for preventive services as well as general preventive health recommendations were provided to patient.     Theodora Blow, LPN   [id number]   Nurse Notes: Patient and Son have concerns of patient's impaired balance and mobility causing her to have falls. Patient recently fractured her right wrist and is unable to do things for herself. She also has falling twice in one week recently Patient is interested in getting Physical Therapy and raised Toilet seat, and shower chair in the home to help prevent her from falling.

## 2019-11-24 NOTE — Telephone Encounter (Signed)
During telephone medicare wellness visit today patient and her son expressed great concerns of patient's fall risk and decreased upper and lower body strength. Patient's son would like to get a shower chair, and a elevated toilet seat to help assist her to prevent her from falling. She most recently had a 2 falls in one week in which one resulted in a right wrist fracture that is currently in a cast. Patient son would like to know if we could place order for Physical therapy as well as order the requested equipment. Please advise?

## 2019-11-24 NOTE — Patient Instructions (Signed)
[name] , Thank you for taking time to come for your Medicare Wellness Visit. I appreciate your ongoing commitment to your health goals. Please review the following plan we discussed and let me know if I can assist you in the future.   Screening recommendations/referrals: Colonoscopy: No longer required  Mammogram: No longer required  Bone Density: No longer required  Recommended yearly ophthalmology/optometry visit for glaucoma screening and checkup Recommended yearly dental visit for hygiene and checkup  Vaccinations: Influenza vaccine: Currently due, you may receive in our office or at your local pharmacy  Pneumococcal vaccine: Completed series  Tdap vaccine: Currently due, you may receive at our office office or at your local pharmacy. Shingles vaccine: Currently due for Shingirx, if you wish to receive we recommend that you receive at your pharmacy as it will be less expensive     Advanced directives: Advance directive discussed with you today. Even though you declined this today please call our office should you change your mind and we can give you the proper paperwork for you to fill out.   Conditions/risks identified: None   Next appointment: 12/29/2019 @ 8:00 am with Dr. Caryl Never   Preventive Care 65 Years and Older, Female Preventive care refers to lifestyle choices and visits with your health care provider that can promote health and wellness. What does preventive care include?  A yearly physical exam. This is also called an annual well check.  Dental exams once or twice a year.  Routine eye exams. Ask your health care provider how often you should have your eyes checked.  Personal lifestyle choices, including:  Daily care of your teeth and gums.  Regular physical activity.  Eating a healthy diet.  Avoiding tobacco and drug use.  Limiting alcohol use.  Practicing safe sex.  Taking low-dose aspirin every day.  Taking vitamin and mineral supplements as  recommended by your health care provider. What happens during an annual well check? The services and screenings done by your health care provider during your annual well check will depend on your age, overall health, lifestyle risk factors, and family history of disease. Counseling  Your health care provider may ask you questions about your:  Alcohol use.  Tobacco use.  Drug use.  Emotional well-being.  Home and relationship well-being.  Sexual activity.  Eating habits.  History of falls.  Memory and ability to understand (cognition).  Work and work Astronomer.  Reproductive health. Screening  You may have the following tests or measurements:  Height, weight, and BMI.  Blood pressure.  Lipid and cholesterol levels. These may be checked every 5 years, or more frequently if you are over 30 years old.  Skin check.  Lung cancer screening. You may have this screening every year starting at age 72 if you have a 30-pack-year history of smoking and currently smoke or have quit within the past 15 years.  Fecal occult blood test (FOBT) of the stool. You may have this test every year starting at age 8.  Flexible sigmoidoscopy or colonoscopy. You may have a sigmoidoscopy every 5 years or a colonoscopy every 10 years starting at age 47.  Hepatitis C blood test.  Hepatitis B blood test.  Sexually transmitted disease (STD) testing.  Diabetes screening. This is done by checking your blood sugar (glucose) after you have not eaten for a while (fasting). You may have this done every 1-3 years.  Bone density scan. This is done to screen for osteoporosis. You may have this done starting  at age 60.  Mammogram. This may be done every 1-2 years. Talk to your health care provider about how often you should have regular mammograms. Talk with your health care provider about your test results, treatment options, and if necessary, the need for more tests. Vaccines  Your health care  provider may recommend certain vaccines, such as:  Influenza vaccine. This is recommended every year.  Tetanus, diphtheria, and acellular pertussis (Tdap, Td) vaccine. You may need a Td booster every 10 years.  Zoster vaccine. You may need this after age 50.  Pneumococcal 13-valent conjugate (PCV13) vaccine. One dose is recommended after age 75.  Pneumococcal polysaccharide (PPSV23) vaccine. One dose is recommended after age 33. Talk to your health care provider about which screenings and vaccines you need and how often you need them. This information is not intended to replace advice given to you by your health care provider. Make sure you discuss any questions you have with your health care provider. Document Released: 01/21/2015 Document Revised: 09/14/2015 Document Reviewed: 10/26/2014 Elsevier Interactive Patient Education  2017 [place] Prevention in the Home Falls can cause injuries. They can happen to people of all ages. There are many things you can do to make your home safe and to help prevent falls. What can I do on the outside of my home?  Regularly fix the edges of walkways and driveways and fix any cracks.  Remove anything that might make you trip as you walk through a door, such as a raised step or threshold.  Trim any bushes or trees on the path to your home.  Use bright outdoor lighting.  Clear any walking paths of anything that might make someone trip, such as rocks or tools.  Regularly check to see if handrails are loose or broken. Make sure that both sides of any steps have handrails.  Any raised decks and porches should have guardrails on the edges.  Have any leaves, snow, or ice cleared regularly.  Use sand or salt on walking paths during winter.  Clean up any spills in your garage right away. This includes oil or grease spills. What can I do in the bathroom?  Use night lights.  Install grab bars by the toilet and in the tub and shower. Do  not use towel bars as grab bars.  Use non-skid mats or decals in the tub or shower.  If you need to sit down in the shower, use a plastic, non-slip stool.  Keep the floor dry. Clean up any water that spills on the floor as soon as it happens.  Remove soap buildup in the tub or shower regularly.  Attach bath mats securely with double-sided non-slip rug tape.  Do not have throw rugs and other things on the floor that can make you trip. What can I do in the bedroom?  Use night lights.  Make sure that you have a light by your bed that is easy to reach.  Do not use any sheets or blankets that are too big for your bed. They should not hang down onto the floor.  Have a firm chair that has side arms. You can use this for support while you get dressed.  Do not have throw rugs and other things on the floor that can make you trip. What can I do in the kitchen?  Clean up any spills right away.  Avoid walking on wet floors.  Keep items that you use a lot in easy-to-reach places.  If  you need to reach something above you, use a strong step stool that has a grab bar.  Keep electrical cords out of the way.  Do not use floor polish or wax that makes floors slippery. If you must use wax, use non-skid floor wax.  Do not have throw rugs and other things on the floor that can make you trip. What can I do with my stairs?  Do not leave any items on the stairs.  Make sure that there are handrails on both sides of the stairs and use them. Fix handrails that are broken or loose. Make sure that handrails are as long as the stairways.  Check any carpeting to make sure that it is firmly attached to the stairs. Fix any carpet that is loose or worn.  Avoid having throw rugs at the top or bottom of the stairs. If you do have throw rugs, attach them to the floor with carpet tape.  Make sure that you have a light switch at the top of the stairs and the bottom of the stairs. If you do not have them,  ask someone to add them for you. What else can I do to help prevent falls?  Wear shoes that:  Do not have high heels.  Have rubber bottoms.  Are comfortable and fit you well.  Are closed at the toe. Do not wear sandals.  If you use a stepladder:  Make sure that it is fully opened. Do not climb a closed stepladder.  Make sure that both sides of the stepladder are locked into place.  Ask someone to hold it for you, if possible.  Clearly mark and make sure that you can see:  Any grab bars or handrails.  First and last steps.  Where the edge of each step is.  Use tools that help you move around (mobility aids) if they are needed. These include:  Canes.  Walkers.  Scooters.  Crutches.  Turn on the lights when you go into a dark area. Replace any light bulbs as soon as they burn out.  Set up your furniture so you have a clear path. Avoid moving your furniture around.  If any of your floors are uneven, fix them.  If there are any pets around you, be aware of where they are.  Review your medicines with your doctor. Some medicines can make you feel dizzy. This can increase your chance of falling. Ask your doctor what other things that you can do to help prevent falls. This information is not intended to replace advice given to you by your health care provider. Make sure you discuss any questions you have with your health care provider. Document Released: 10/21/2008 Document Revised: 06/02/2015 Document Reviewed: 01/29/2014 Elsevier Interactive Patient Education  2017 [employer].

## 2019-11-25 NOTE — Telephone Encounter (Signed)
Spoke with patient and she stated that she would contact her son Jonny Ruiz to see what they were going to do in regards to PT and her medical equipment as she stated that her son handles all of her medical care. She also stated that her son got her a toilet seat with handles

## 2019-11-25 NOTE — Telephone Encounter (Signed)
Placed DME order for requested items and printed it out.   We need to know the following:  1)  Where do they want to get the supplies?  We can fax or they can pick up printed copy  2)  Do they want outpatient PT or in home PT?  If they want outpatient, OK to go ahead and place that referral.   If requiring in home insurance (Medicare) usually requires face to face visit to discuss.    Thanks!

## 2019-11-28 ENCOUNTER — Other Ambulatory Visit: Payer: Self-pay | Admitting: Cardiovascular Disease

## 2019-11-30 ENCOUNTER — Encounter: Payer: Self-pay | Admitting: Cardiovascular Disease

## 2019-11-30 NOTE — Progress Notes (Signed)
Cardiology Office Note   Date:  12/01/2019   ID:  [name], DOB [birth year], MRN [id number]  PCP:  [name], MD  Cardiologist:   Kristeen Miss, MD   No chief complaint on file.  1. Atrial fibrillation 2. Hypertension 3. Benign Positional Vertigo 4. Chronic systolic CHF -  Left ventricle: The cavity size was mildly dilated. Wall thickness was increased in a pattern of mild LVH. The estimated ejection fraction was 20%. Diffuse hypokinesis. - Mitral valve: Mild to moderate regurgitation. - Left atrium: The atrium was mildly dilated. - Right ventricle: Systolic function was mildly to moderately reduced. - Right atrium: The atrium was mildly dilated. - Tricuspid valve: Mild-moderate regurgitation. - Pulmonary arteries: PA peak pressure: 20mm Hg  Previous notes:  [name] is a 86 y.o. female with a history of atrial fibrillation. She was cardioverted in October, 2012. She unfortunately has gone back into atrial fibrillation.  She has felt very well. She's not having any episodes of chest pain or shortness breath. She's tolerating the rhythm quite well. Her only complaint is that she's itching from the patches were placed during the cardioversion. She also complains of having very dry and cracking skin on her hands.   Sept. 16, 2013 She has been exercising regularly. She has gained weight since I last saw her ( 11 lbs.) She has no complaints of dyspnea or chest pain.  March 21, 2012:  She was seen by Lorin Picket several weeks ago. She was scheduled have an echocardiogram but she has not had that yet. ( was scheduled but we had a big snow storm). She's been having more shortness breath and more leg edema. Scar decreased her Lasix. Her leg swelling is better. He also has some chest heaviness. She is trying to limit her stair climbing.  April 04, 2012:  Her recent echo shows markedly decreased LV function   Left ventricle: The cavity size was mildly dilated.  Wall thickness was increased in a pattern of mild LVH. The estimated ejection fraction was 20%. Diffuse hypokinesis. - Mitral valve: Mild to moderate regurgitation. - Left atrium: The atrium was mildly dilated. - Right ventricle: Systolic function was mildly to moderately reduced. - Right atrium: The atrium was mildly dilated. - Tricuspid valve: Mild-moderate regurgitation. - Pulmonary arteries: PA peak pressure: 42mm Hg (S).  her myoivew study revealed an apical defect - ? Apical thinning. Low risk study.  She continues to have shortness breath. Not sure that she's having any chest pain. It's very difficult to get an accurate history of her. She seems to ramble in her conversation.  July 02, 2012:  Flow had a cardiac cath since of last year. She developed a rash after the cath She has nonobstructive coronary artery disease. Her ejection fraction is severely depressed with an EF of 20-25%. She continues to have atrial fibrillation with a well-controlled ventricular response. She is on chronic Coumadin therapy.  She goes to the [place] twice a week. She walks to [employer] ([place] to [place],- about 1 mile) She stays very active. She complains of being short of breath but she remains very active.   Dec. 22, 2014:  [name] continues to have more dyspnea. She added an extra Lasix ( now 4 times a day). She does not think that she's breathing any better since increasing her Lasix from 3 times a day to 4 times a day. She still walks on a regular basis except very bad weather.   04/10/2013:  [name] has done  well. She had a brief episode of sharp CP - lasted 15 seconds. She has not been going to the gym. She is exercising in the AM to a CD. Able to workout without any CP or dyspnea.   Oct. 5, 2015:  [name] is seen back for follow up visit. Short of breath - about the same.  Sprained her ankle - had a cortisone injection Had a birthday recently.  May 24, 2014:  [name]  is a 86 y.o. female who presents for her atrial fib  Nov. 15, 2016:  Complains that the metoprolol is thinning her hair.   Breathing is ok Goes to the gym,  Still gets out of breath  Has plantar fascitis.  July 07, 2015:  [name] is doing well.  Occasional blood in her stool  Still being bothered with plantar fascitis  BP is well controlled . INR is theraputic . Tolerating meds fairly well.   Dec. 12, 2017: 86 y.o. female with hx of CHF  Breathing is ok.  Some DOE when she climbs up 13 steps ( at her sons home )   Sept. 4, 2018:  Doing well from a cardiac standpoint.   Has some DOE climbing 13 steps .  No CP  Has some gas pains.  Has some dizziness,   BP looks great   Jun 04, 2017  [name] is seen today for follow-up visit.  She has chronic atrial fibrillation.   She is on chronic Coumadin therapy.  She is been having some chest pain that she thinks is caused by the Aldactone.  She has a history of hypertension.  Her blood pressure has been very well controlled.   She has CP while at rest.   Typically watching TV.   CP occurs about 1-2 hours after she takes the [place].   Still goes to the gym 3 days a week .   Dec. 17, 2019:  Still has some shortness of breath Also has some chest pain / tightness  Had a normal myoview in 2012  Walks a mile - has to stop 4 times during the walk  Is able to go up a hill   Nov. 23, 2021:  [name] is seen today for follow up of her chronic Afib and CHF.  Seen with her son , Jonny Ruiz .  She has fallen recently and fell and broke her right wrist  No CP Still having some dyspnea,   Has fatigue with metorolol  No recent episodes of syncope since Jonny Ruiz has made sure that she is eating a good breakfast.   Past Medical History:  Diagnosis Date  . Atrial fib/flutter, transient   . B12 DEFICIENCY 11/25/2009  . CHF 01/27/2009  . Chronic systolic heart failure (HCC) 07/25/2009   a. Myoview 5/12:  Low risk, no ischemia, apical lateral defect probably  breast attenuation, inf HK, EF 45%.  b. Echo 8/12: EF 45-50%, inf/inf-septal and apical HK, mild LVH, mild to mod MR, mild to mod TR, PASP 50;  c.  Echo 3/14:  Mild LVH, EF 20%, diff HK, mild to mod MR, mild LAE, mild to mod reduced RVSF, mild RAE, mild to mod TR, PASP 41  . DIVERTICULITIS OF COLON 01/27/2009  . ESSENTIAL HYPERTENSION 09/30/2009  . Hemorrhoids   . Hypertension   . Left ventricular dysfunction   . OSTEOARTHRITIS, KNEE, RIGHT 02/08/2009  . OSTEOPENIA 01/27/2009  . Rectal bleeding   . SMALL BOWEL OBSTRUCTION 05/05/2008    Past Surgical History:  Procedure  Laterality Date  . ABDOMINAL HYSTERECTOMY  1996   TAHBSO  . APPENDECTOMY  1951  . CARDIAC CATHETERIZATION  April 14, 2012   nonobstructive CAD; severe LV dysfunction.   . COLON SURGERY  2006   Sigmoid colectomy for diverticulitis  . TONSILLECTOMY  1966     Current Outpatient Medications  Medication Sig Dispense Refill  . atorvastatin (LIPITOR) 20 MG tablet Take 1 tablet (20 mg total) by mouth daily. 90 tablet 1  . azelastine (OPTIVAR) 0.05 % ophthalmic solution INSTILL 1 TO 2 DROPS INTO EACH EYE ONCE DAILY AS NEEDED FOR ALLERGIES    . cromolyn (OPTICROM) 4 % ophthalmic solution INSTILL 1 DROP INTO EACH EYE TWICE DAILY AS DIRECTED AS NEEDED FOR ALLERGIES    . Ferrous Gluconate (IRON 27 PO) Take 1 tablet by mouth daily.    . furosemide (LASIX) 40 MG tablet TAKE 1 TABLET BY MOUTH IN  THE MORNING AND 1/2 TABLET  IN THE EVENING 45 tablet 0  . hydrocortisone cream 0.5 % Apply 1 application topically 2 (two) times daily.    Marland Kitchen. loratadine (CLARITIN) 10 MG tablet Take 10 mg by mouth daily.      . metoprolol tartrate (LOPRESSOR) 100 MG tablet TAKE 1 TABLET BY MOUTH TWICE DAILY . APPOINTMENT REQUIRED FOR FUTURE REFILLS 60 tablet 0  . Multiple Vitamin (MULTIVITAMIN) tablet Take 1 tablet by mouth daily.      . potassium chloride (KLOR-CON) 10 MEQ tablet Take 3 tablets (30 mEq total) by mouth daily. 270 tablet 0  . potassium chloride  (KLOR-CON) 10 MEQ tablet Take by mouth.    . sodium fluoride (FLUORISHIELD) 1.1 % GEL dental gel     . traMADol (ULTRAM) 50 MG tablet Take 50 mg by mouth 2 (two) times daily as needed.    . warfarin (COUMADIN) 5 MG tablet Take as directed by coumadin clinic 90 tablet 0   No current facility-administered medications for this visit.    Allergies:   Gabapentin, Bactrim, Codeine, Contrast media [iodinated diagnostic agents], Nitrofurantoin, Nitrofurantoin monohyd macro, Penicillins, and Sulfamethoxazole-trimethoprim    Social History:  The patient  reports that she has never smoked. She has never used smokeless tobacco. She reports that she does not drink alcohol and does not use drugs.   Family History:  The patient's family history includes Emphysema in an other family member; Heart disease in her father, sister, and another family member.    ROS:   Noted in current history, otherwise review of systems is negative.    Physical Exam: Blood pressure 112/76, pulse 80, height 5' 6.5" (1.689 m), weight 153 lb 6.4 oz (69.6 kg), SpO2 97 %.  GEN:  Elderly female,  NAD , pretty pink cast on her right forearm HEENT: Normal NECK: No JVD; No carotid bruits LYMPHATICS: No lymphadenopathy CARDIAC: Irregl. Irreg.  RESPIRATORY:  Clear to auscultation without rales, wheezing or rhonchi  ABDOMEN: Soft, non-tender, non-distended MUSCULOSKELETAL:  No edema; No deformity  SKIN: Warm and dry NEUROLOGIC:  Alert and oriented x 3    EKG: December 01, 2019: Atrial fibrillation with a heart rate of 80.  T wave inversions in the anterolateral leads no changes from previous EKG.   Recent Labs: 06/29/2019: ALT 19; BUN 21; Creatinine, Ser 0.96; Potassium 4.1; Sodium 140    Lipid Panel    Component Value Date/Time   CHOL 131 06/29/2019 0913   TRIG 107.0 06/29/2019 0913   HDL 56.20 06/29/2019 0913   CHOLHDL 2 06/29/2019 0913  VLDL 21.4 06/29/2019 0913   LDLCALC 53 06/29/2019 0913   LDLDIRECT 142.0  12/16/2013 0857      Wt Readings from Last 3 Encounters:  12/01/19 153 lb 6.4 oz (69.6 kg)  06/29/19 168 lb (76.2 kg)  12/29/18 162 lb 6.4 oz (73.7 kg)      Other studies Reviewed: Additional studies/ records that were reviewed today include: . Review of the above records demonstrates:    ASSESSMENT AND PLAN:  1.  Chronic Atrial fibrillation-     blood is doing well.  She has chronic atrial fibrillation.  She is on warfarin.  Her INR levels are managed by the Coumadin clinic.  Her last INR was 3.0.  Her rate is well controlled on the current dose of metoprolol.   2.  Chronic systolic CHF -she is doing well from heart failure standpoint.  She does have some shortness of breath but overall remains very functional.  3. Hypertension-   blood pressure remains very well controlled.       Current medicines are reviewed at length with the patient today.  The patient does not have concerns regarding medicines.  The following changes have been made:  no change  Labs/ tests ordered today include:   No orders of the defined types were placed in this encounter. DC spironolactone    Disposition:  Follow up in 6 months     Kristeen Miss, MD  12/01/2019 8:19 AM    [facility] [address] [place], [place], [place]  [address] Phone: [phone]; Fax: [phone]

## 2019-12-01 ENCOUNTER — Ambulatory Visit: Payer: Medicare Other | Admitting: Cardiovascular Disease

## 2019-12-01 ENCOUNTER — Encounter: Payer: Self-pay | Admitting: Cardiovascular Disease

## 2019-12-01 ENCOUNTER — Other Ambulatory Visit: Payer: Self-pay

## 2019-12-01 VITALS — BP 112/76 | HR 80 | Ht 66.5 in | Wt 153.4 lb

## 2019-12-01 DIAGNOSIS — I5022 Chronic systolic (congestive) heart failure: Secondary | ICD-10-CM

## 2019-12-01 DIAGNOSIS — I4811 Longstanding persistent atrial fibrillation: Secondary | ICD-10-CM | POA: Diagnosis not present

## 2019-12-01 DIAGNOSIS — I10 Essential (primary) hypertension: Secondary | ICD-10-CM

## 2019-12-01 MED ORDER — METOPROLOL TARTRATE 100 MG PO TABS: ORAL_TABLET | ORAL | 3 refills | Status: AC

## 2019-12-01 NOTE — Patient Instructions (Signed)
Medication Instructions:  °The current medical regimen is effective;  continue present plan and medications. ° °*If you need a refill on your cardiac medications before your next appointment, please call your pharmacy* ° °Follow-Up: °At [facility], you and your health needs are our priority.  As part of our continuing mission to provide you with exceptional heart care, we have created designated Provider Care Teams.  These Care Teams include your primary Cardiologist (physician) and Advanced Practice Providers (APPs -  Physician Assistants and Nurse Practitioners) who all work together to provide you with the care you need, when you need it. ° °We recommend signing up for the patient portal called "MyChart".  Sign up information is provided on this After Visit Summary.  MyChart is used to connect with patients for Virtual Visits (Telemedicine).  Patients are able to view lab/test results, encounter notes, upcoming appointments, etc.  Non-urgent messages can be sent to your provider as well.   °To learn more about what you can do with MyChart, go to [email or web address].   ° °Your next appointment:   °1 year(s) ° °The format for your next appointment:   °In Person ° °Provider:   °Philip Nahser, MD   ° ° °Thank you for choosing [facility]!! ° ° ° °

## 2019-12-09 DIAGNOSIS — S52501D Unspecified fracture of the lower end of right radius, subsequent encounter for closed fracture with routine healing: Secondary | ICD-10-CM | POA: Diagnosis not present

## 2019-12-09 NOTE — Telephone Encounter (Signed)
Spoke with patient's son and gave him the prescription for DME. Son has made an appointment to discuss Home Health and would now like a detachable shower head for patient to use while sitting on shower chair. May we please have a prescription?

## 2019-12-10 NOTE — Telephone Encounter (Signed)
She has an appt in just 5 days and I will discuss with them at that time.

## 2019-12-11 ENCOUNTER — Other Ambulatory Visit: Payer: Self-pay | Admitting: Cardiovascular Disease

## 2019-12-14 ENCOUNTER — Other Ambulatory Visit: Payer: Self-pay

## 2019-12-14 ENCOUNTER — Ambulatory Visit (INDEPENDENT_AMBULATORY_CARE_PROVIDER_SITE_OTHER): Payer: Medicare Other | Admitting: General Practice

## 2019-12-14 DIAGNOSIS — I4891 Unspecified atrial fibrillation: Secondary | ICD-10-CM

## 2019-12-14 DIAGNOSIS — Z7901 Long term (current) use of anticoagulants: Secondary | ICD-10-CM

## 2019-12-14 LAB — POCT INR: INR: 6 — AB (ref 2.0–3.0)

## 2019-12-14 NOTE — Patient Instructions (Addendum)
Pre visit review using our clinic review tool, if applicable. No additional management support is needed unless otherwise documented below in the visit note.  Stop medication!  Re-check in 1 week.  Go to ER if you have any unusual bleeding!

## 2019-12-15 ENCOUNTER — Ambulatory Visit (INDEPENDENT_AMBULATORY_CARE_PROVIDER_SITE_OTHER): Payer: Medicare Other | Admitting: Family Medicine

## 2019-12-15 ENCOUNTER — Encounter: Payer: Self-pay | Admitting: Family Medicine

## 2019-12-15 VITALS — BP 113/73 | HR 89 | Ht 65.5 in | Wt 153.0 lb

## 2019-12-15 DIAGNOSIS — Z9181 History of falling: Secondary | ICD-10-CM | POA: Diagnosis not present

## 2019-12-15 DIAGNOSIS — G629 Polyneuropathy, unspecified: Secondary | ICD-10-CM | POA: Diagnosis not present

## 2019-12-15 NOTE — Progress Notes (Unsigned)
Established Patient Office Visit  Subjective:  Patient ID: Taneya Conkel, female    DOB: 1926/03/17  Age: 86 y.o. MRN: [id number]  CC:  Chief Complaint  Patient presents with  . Fall    HPI Peightyn Roberson presents for recent increased falls.  Chronic problems include history of atrial fibrillation, chronic systolic heart failure, hypertension, GERD, polyneuropathy, osteoarthritis, chronic kidney disease, chronic anticoagulation.  She is accompanied by her son today.  She apparently has had 3 falls in the past 6 weeks.  With 1 of these fall she had right wrist fracture which required casting.  She does have history of polyneuropathy which makes her balance very difficult.  She has been fairly active up until this year.  With each of the falls above she stood up and apparently went to move fairly quickly and lost her balance and fell.  Son states that twice she was tilting her head down toward her knees when she lost balance and fell.  They recently called requesting several things for home adaptation including a shower chair, elevated toilet seat, and adjustable showerhead.  She does remain on Coumadin and has increased risk because of that.  Recent INR 6.0.  She is currently holding her Coumadin and followed to the Coumadin clinic.  She has hx of a fib and systolic heart failure.  Has been gradually declining over past few years.   Past Medical History:  Diagnosis Date  . Atrial fib/flutter, transient   . B12 DEFICIENCY 11/25/2009  . CHF 01/27/2009  . Chronic systolic heart failure (HCC) 07/25/2009   a. Myoview 5/12:  Low risk, no ischemia, apical lateral defect probably breast attenuation, inf HK, EF 45%.  b. Echo 8/12: EF 45-50%, inf/inf-septal and apical HK, mild LVH, mild to mod MR, mild to mod TR, PASP 50;  c.  Echo 3/14:  Mild LVH, EF 20%, diff HK, mild to mod MR, mild LAE, mild to mod reduced RVSF, mild RAE, mild to mod TR, PASP 41  . DIVERTICULITIS OF COLON 01/27/2009  .  ESSENTIAL HYPERTENSION 09/30/2009  . Hemorrhoids   . Hypertension   . Left ventricular dysfunction   . OSTEOARTHRITIS, KNEE, RIGHT 02/08/2009  . OSTEOPENIA 01/27/2009  . Rectal bleeding   . SMALL BOWEL OBSTRUCTION 05/05/2008    Past Surgical History:  Procedure Laterality Date  . ABDOMINAL HYSTERECTOMY  1996   TAHBSO  . APPENDECTOMY  1951  . CARDIAC CATHETERIZATION  April 14, 2012   nonobstructive CAD; severe LV dysfunction.   . COLON SURGERY  2006   Sigmoid colectomy for diverticulitis  . TONSILLECTOMY  1966    Family History  Problem Relation Age of Onset  . Heart disease Father   . Heart disease Sister   . Heart disease Other   . Emphysema Other     Social History   Socioeconomic History  . Marital status: Widowed    Spouse name: Not on file  . Number of children: Not on file  . Years of education: Not on file  . Highest education level: Not on file  Occupational History  . Not on file  Tobacco Use  . Smoking status: Never Smoker  . Smokeless tobacco: Never Used  Vaping Use  . Vaping Use: Never used  Substance and Sexual Activity  . Alcohol use: No  . Drug use: No  . Sexual activity: Not Currently  Other Topics Concern  . Not on file  Social History Narrative  . Not on file   Social  Determinants of Health   Financial Resource Strain: Low Risk   . Difficulty of Paying Living Expenses: Not hard at all  Food Insecurity: No Food Insecurity  . Worried About Programme researcher, broadcasting/film/video in the Last Year: Never true  . Ran Out of Food in the Last Year: Never true  Transportation Needs: No Transportation Needs  . Lack of Transportation (Medical): No  . Lack of Transportation (Non-Medical): No  Physical Activity: Inactive  . Days of Exercise per Week: 0 days  . Minutes of Exercise per Session: 0 min  Stress: No Stress Concern Present  . Feeling of Stress : Not at all  Social Connections: Socially Isolated  . Frequency of Communication with Friends and Family: More  than three times a week  . Frequency of Social Gatherings with Friends and Family: More than three times a week  . Attends Religious Services: Never  . Active Member of Clubs or Organizations: No  . Attends Banker Meetings: Never  . Marital Status: Divorced  Catering manager Violence: Not At Risk  . Fear of Current or Ex-Partner: No  . Emotionally Abused: No  . Physically Abused: No  . Sexually Abused: No    Outpatient Medications Prior to Visit  Medication Sig Dispense Refill  . atorvastatin (LIPITOR) 20 MG tablet Take 1 tablet (20 mg total) by mouth daily. 90 tablet 1  . azelastine (OPTIVAR) 0.05 % ophthalmic solution INSTILL 1 TO 2 DROPS INTO EACH EYE ONCE DAILY AS NEEDED FOR ALLERGIES    . cromolyn (OPTICROM) 4 % ophthalmic solution INSTILL 1 DROP INTO EACH EYE TWICE DAILY AS DIRECTED AS NEEDED FOR ALLERGIES    . Ferrous Gluconate (IRON 27 PO) Take 1 tablet by mouth daily.    . furosemide (LASIX) 40 MG tablet TAKE 1 TABLET BY MOUTH ONCE DAILY IN THE MORNING AND 1/2 (ONE-HALF) TABLET ONCE DAILY IN THE EVENING 135 tablet 3  . hydrocortisone cream 0.5 % Apply 1 application topically 2 (two) times daily.    Marland Kitchen loratadine (CLARITIN) 10 MG tablet Take 10 mg by mouth daily.      . metoprolol tartrate (LOPRESSOR) 100 MG tablet TAKE 1 TABLET BY MOUTH TWICE DAILY 180 tablet 3  . Multiple Vitamin (MULTIVITAMIN) tablet Take 1 tablet by mouth daily.      . potassium chloride (KLOR-CON) 10 MEQ tablet Take 3 tablets (30 mEq total) by mouth daily. 270 tablet 0  . sodium fluoride (FLUORISHIELD) 1.1 % GEL dental gel     . traMADol (ULTRAM) 50 MG tablet Take 50 mg by mouth 2 (two) times daily as needed.    . warfarin (COUMADIN) 5 MG tablet Take as directed by coumadin clinic 90 tablet 0   No facility-administered medications prior to visit.    Allergies  Allergen Reactions  . Gabapentin Nausea And Vomiting and Other (See Comments)    Did not feel like her self so she stopped.   .  Bactrim Nausea Only and Other (See Comments)    headache  . Codeine Nausea Only  . Contrast Media [Iodinated Diagnostic Agents] Other (See Comments)    Pt unsure of reaction  . Nitrofurantoin Other (See Comments)    Pt unsure of reaction  . Nitrofurantoin Monohyd Macro Other (See Comments)    Pt unsure of reaction  . Penicillins Nausea And Vomiting  . Sulfamethoxazole-Trimethoprim Other (See Comments)    Pt unsure of reaction    ROS Review of Systems  Constitutional: Negative for chills and  fever.  Respiratory: Negative for cough.   Cardiovascular: Negative for chest pain.  Genitourinary: Negative for dysuria.  Neurological: Positive for numbness.  Psychiatric/Behavioral: Negative for confusion.      Objective:    Physical Exam Vitals reviewed.  Constitutional:      Appearance: Normal appearance.  Cardiovascular:     Rate and Rhythm: Normal rate.  Pulmonary:     Effort: Pulmonary effort is normal.     Breath sounds: Normal breath sounds.  Neurological:     General: No focal deficit present.     Mental Status: She is alert.     BP 113/73   Pulse 89   Ht 5' 5.5" (1.664 m)   Wt 153 lb (69.4 kg)   BMI 25.07 kg/m  Wt Readings from Last 3 Encounters:  12/15/19 153 lb (69.4 kg)  12/01/19 153 lb 6.4 oz (69.6 kg)  06/29/19 168 lb (76.2 kg)     Health Maintenance Due  Topic Date Due  . COVID-19 Vaccine (1) Never done  . TETANUS/TDAP  01/08/2013  . INFLUENZA VACCINE  08/09/2019    There are no preventive care reminders to display for this patient.  Lab Results  Component Value Date   TSH 3.93 02/01/2016   Lab Results  Component Value Date   WBC 6.1 08/29/2018   HGB 13.6 08/29/2018   HCT 40.3 08/29/2018   MCV 99.3 08/29/2018   PLT 123.0 Repeated and verified X2. (L) 08/29/2018   Lab Results  Component Value Date   NA 140 06/29/2019   K 4.1 06/29/2019   CO2 30 06/29/2019   GLUCOSE 95 06/29/2019   BUN 21 06/29/2019   CREATININE 0.96 06/29/2019    BILITOT 1.6 (H) 06/29/2019   ALKPHOS 69 06/29/2019   AST 26 06/29/2019   ALT 19 06/29/2019   PROT 6.2 06/29/2019   ALBUMIN 4.0 06/29/2019   CALCIUM 9.1 06/29/2019   GFR 54.51 (L) 06/29/2019   Lab Results  Component Value Date   CHOL 131 06/29/2019   Lab Results  Component Value Date   HDL 56.20 06/29/2019   Lab Results  Component Value Date   LDLCALC 53 06/29/2019   Lab Results  Component Value Date   TRIG 107.0 06/29/2019   Lab Results  Component Value Date   CHOLHDL 2 06/29/2019   Lab Results  Component Value Date   HGBA1C 5.8 08/06/2016      Assessment & Plan:   Problem List Items Addressed This Visit    None    Visit Diagnoses    At high risk for falls    -  Primary   Relevant Orders   For home use only DME Other see comment    Patient has had high risk for falls exacerbated by her polyneuropathy.  Previous testing for B12, thyroid, diabetes, multimyeloma has been unremarkable.  -We sent an order for adjustable showerhead -Set up home physical therapy for strengthening exercises and reduce fall risk.  We also discussed possible OT consult but son declines at this time. -we also discussed possible assistance with ambulation such as cane or walker and will await advice from PT regarding that.   Son thinks she would have difficult time navigating small space in her apartment with walker.   A fib currently over- anticoagulated and holding coumadin.  Continue close follow up with coumadin clinic.    No orders of the defined types were placed in this encounter.   Follow-up: No follow-ups on file.  Carolann Littler, MD

## 2019-12-15 NOTE — Patient Instructions (Signed)

## 2019-12-21 ENCOUNTER — Ambulatory Visit (INDEPENDENT_AMBULATORY_CARE_PROVIDER_SITE_OTHER): Payer: Medicare Other | Admitting: General Practice

## 2019-12-21 ENCOUNTER — Ambulatory Visit: Payer: Medicare Other

## 2019-12-21 DIAGNOSIS — I4891 Unspecified atrial fibrillation: Secondary | ICD-10-CM | POA: Diagnosis not present

## 2019-12-21 DIAGNOSIS — Z7901 Long term (current) use of anticoagulants: Secondary | ICD-10-CM

## 2019-12-21 LAB — POCT INR: INR: 1.1 — AB (ref 2.0–3.0)

## 2019-12-21 NOTE — Patient Instructions (Addendum)
Pre visit review using our clinic review tool, if applicable. No additional management support is needed unless otherwise documented below in the visit note.  Take 1 tablet today, tomorrow and Wednesday. On Thursday change dosage and take 1/2 tablet daily except 1 tablet on Tues, Thurs and Saturday.  Re-check in 7 to 10 days.  *I spoke with patient's son Jonny Ruiz about patient's dosing instructions.

## 2019-12-22 ENCOUNTER — Telehealth: Payer: Self-pay | Admitting: Family Medicine

## 2019-12-22 DIAGNOSIS — G629 Polyneuropathy, unspecified: Secondary | ICD-10-CM | POA: Diagnosis not present

## 2019-12-22 DIAGNOSIS — I4891 Unspecified atrial fibrillation: Secondary | ICD-10-CM | POA: Diagnosis not present

## 2019-12-22 DIAGNOSIS — I130 Hypertensive heart and chronic kidney disease with heart failure and stage 1 through stage 4 chronic kidney disease, or unspecified chronic kidney disease: Secondary | ICD-10-CM | POA: Diagnosis not present

## 2019-12-22 DIAGNOSIS — I5022 Chronic systolic (congestive) heart failure: Secondary | ICD-10-CM | POA: Diagnosis not present

## 2019-12-22 DIAGNOSIS — H8110 Benign paroxysmal vertigo, unspecified ear: Secondary | ICD-10-CM | POA: Diagnosis not present

## 2019-12-22 DIAGNOSIS — I4892 Unspecified atrial flutter: Secondary | ICD-10-CM | POA: Diagnosis not present

## 2019-12-22 DIAGNOSIS — M1711 Unilateral primary osteoarthritis, right knee: Secondary | ICD-10-CM | POA: Diagnosis not present

## 2019-12-22 DIAGNOSIS — Z9181 History of falling: Secondary | ICD-10-CM

## 2019-12-22 DIAGNOSIS — R296 Repeated falls: Secondary | ICD-10-CM | POA: Diagnosis not present

## 2019-12-22 DIAGNOSIS — N189 Chronic kidney disease, unspecified: Secondary | ICD-10-CM | POA: Diagnosis not present

## 2019-12-22 DIAGNOSIS — S62101D Fracture of unspecified carpal bone, right wrist, subsequent encounter for fracture with routine healing: Secondary | ICD-10-CM | POA: Diagnosis not present

## 2019-12-22 DIAGNOSIS — G5793 Unspecified mononeuropathy of bilateral lower limbs: Secondary | ICD-10-CM | POA: Diagnosis not present

## 2019-12-22 NOTE — Telephone Encounter (Signed)
OK to proceed with order as requested.   

## 2019-12-22 NOTE — Telephone Encounter (Signed)
Vernona Rieger w/[facility] is calling to see if she can get verbal order today for the following PT 2 week 4 1 week 4 and OT evaluation and they need a script for a rolling walker to be faxed to [facility] office [phone] (f).

## 2019-12-23 ENCOUNTER — Ambulatory Visit: Payer: Medicare Other

## 2019-12-23 NOTE — Telephone Encounter (Signed)
Okay to send order as requested

## 2019-12-23 NOTE — Telephone Encounter (Signed)
Vernona Rieger is calling back and stated that family called back and instead of patient getting a rolling walker they would like a rollator with a seat, please advise. CB is [phone] Fax number is [phone]

## 2019-12-23 NOTE — Telephone Encounter (Signed)
Left a detailed on the voicemail for Vernona Rieger with orders as below.  Printed order for the DME-rolling walker was faxed to the number below.

## 2019-12-24 NOTE — Telephone Encounter (Signed)
Rx printed and faxed to the number below.

## 2019-12-24 NOTE — Addendum Note (Signed)
Addended by: Johnella Moloney on: 12/24/2019 08:23 AM   Modules accepted: Orders

## 2019-12-25 ENCOUNTER — Telehealth: Payer: Self-pay | Admitting: Family Medicine

## 2019-12-25 DIAGNOSIS — M1711 Unilateral primary osteoarthritis, right knee: Secondary | ICD-10-CM | POA: Diagnosis not present

## 2019-12-25 DIAGNOSIS — N189 Chronic kidney disease, unspecified: Secondary | ICD-10-CM | POA: Diagnosis not present

## 2019-12-25 DIAGNOSIS — S62101D Fracture of unspecified carpal bone, right wrist, subsequent encounter for fracture with routine healing: Secondary | ICD-10-CM | POA: Diagnosis not present

## 2019-12-25 DIAGNOSIS — I130 Hypertensive heart and chronic kidney disease with heart failure and stage 1 through stage 4 chronic kidney disease, or unspecified chronic kidney disease: Secondary | ICD-10-CM | POA: Diagnosis not present

## 2019-12-25 DIAGNOSIS — G5793 Unspecified mononeuropathy of bilateral lower limbs: Secondary | ICD-10-CM | POA: Diagnosis not present

## 2019-12-25 DIAGNOSIS — R296 Repeated falls: Secondary | ICD-10-CM | POA: Diagnosis not present

## 2019-12-25 DIAGNOSIS — H8110 Benign paroxysmal vertigo, unspecified ear: Secondary | ICD-10-CM | POA: Diagnosis not present

## 2019-12-25 DIAGNOSIS — I4891 Unspecified atrial fibrillation: Secondary | ICD-10-CM | POA: Diagnosis not present

## 2019-12-25 DIAGNOSIS — I5022 Chronic systolic (congestive) heart failure: Secondary | ICD-10-CM | POA: Diagnosis not present

## 2019-12-25 DIAGNOSIS — G629 Polyneuropathy, unspecified: Secondary | ICD-10-CM | POA: Diagnosis not present

## 2019-12-25 DIAGNOSIS — I4892 Unspecified atrial flutter: Secondary | ICD-10-CM | POA: Diagnosis not present

## 2019-12-25 NOTE — Telephone Encounter (Signed)
OK 

## 2019-12-25 NOTE — Telephone Encounter (Signed)
Mya w/[facility] is calling to get verbal orders to move the pts OT evaluation from this week to next week.  Can leave a detail msg on her secured voice mail.

## 2019-12-27 ENCOUNTER — Other Ambulatory Visit: Payer: Self-pay | Admitting: Cardiovascular Disease

## 2019-12-28 ENCOUNTER — Other Ambulatory Visit: Payer: Self-pay | Admitting: Cardiovascular Disease

## 2019-12-28 NOTE — Telephone Encounter (Signed)
Left a detailed message on [name]'s voicemail with the approval for orders as below.

## 2019-12-28 NOTE — Telephone Encounter (Signed)
Outpatient Medication Detail ° ° Disp Refills Start End   °metoprolol tartrate (LOPRESSOR) 100 MG tablet 180 tablet 3 12/01/2019    °Sig: TAKE 1 TABLET BY MOUTH TWICE DAILY   °Sent to pharmacy as: metoprolol tartrate (LOPRESSOR) 100 MG tablet   °E-Prescribing Status: Receipt confirmed by pharmacy (12/01/2019  8:22 AM EST)   ° ° °Pharmacy ° °[id number] - [place], [place] - [address]  ° °

## 2019-12-29 ENCOUNTER — Other Ambulatory Visit: Payer: Self-pay | Admitting: Family Medicine

## 2019-12-29 ENCOUNTER — Ambulatory Visit: Payer: Medicare Other | Admitting: Family Medicine

## 2019-12-29 DIAGNOSIS — N189 Chronic kidney disease, unspecified: Secondary | ICD-10-CM | POA: Diagnosis not present

## 2019-12-29 DIAGNOSIS — S62101D Fracture of unspecified carpal bone, right wrist, subsequent encounter for fracture with routine healing: Secondary | ICD-10-CM | POA: Diagnosis not present

## 2019-12-29 DIAGNOSIS — I4892 Unspecified atrial flutter: Secondary | ICD-10-CM | POA: Diagnosis not present

## 2019-12-29 DIAGNOSIS — M1711 Unilateral primary osteoarthritis, right knee: Secondary | ICD-10-CM | POA: Diagnosis not present

## 2019-12-29 DIAGNOSIS — G5793 Unspecified mononeuropathy of bilateral lower limbs: Secondary | ICD-10-CM | POA: Diagnosis not present

## 2019-12-29 DIAGNOSIS — G629 Polyneuropathy, unspecified: Secondary | ICD-10-CM | POA: Diagnosis not present

## 2019-12-29 DIAGNOSIS — I4891 Unspecified atrial fibrillation: Secondary | ICD-10-CM | POA: Diagnosis not present

## 2019-12-29 DIAGNOSIS — H8110 Benign paroxysmal vertigo, unspecified ear: Secondary | ICD-10-CM | POA: Diagnosis not present

## 2019-12-29 DIAGNOSIS — R296 Repeated falls: Secondary | ICD-10-CM | POA: Diagnosis not present

## 2019-12-29 DIAGNOSIS — I5022 Chronic systolic (congestive) heart failure: Secondary | ICD-10-CM | POA: Diagnosis not present

## 2019-12-29 DIAGNOSIS — I130 Hypertensive heart and chronic kidney disease with heart failure and stage 1 through stage 4 chronic kidney disease, or unspecified chronic kidney disease: Secondary | ICD-10-CM | POA: Diagnosis not present

## 2019-12-29 NOTE — Telephone Encounter (Signed)
Outpatient Medication Detail   Disp Refills Start End   metoprolol tartrate (LOPRESSOR) 100 MG tablet 180 tablet 3 12/01/2019    Sig: TAKE 1 TABLET BY MOUTH TWICE DAILY   Sent to pharmacy as: metoprolol tartrate (LOPRESSOR) 100 MG tablet   E-Prescribing Status: Receipt confirmed by pharmacy (12/01/2019  8:22 AM EST)     Pharmacy  [id number] - [place], [place] - [address]

## 2019-12-31 DIAGNOSIS — N189 Chronic kidney disease, unspecified: Secondary | ICD-10-CM | POA: Diagnosis not present

## 2019-12-31 DIAGNOSIS — S62101D Fracture of unspecified carpal bone, right wrist, subsequent encounter for fracture with routine healing: Secondary | ICD-10-CM | POA: Diagnosis not present

## 2019-12-31 DIAGNOSIS — M1711 Unilateral primary osteoarthritis, right knee: Secondary | ICD-10-CM | POA: Diagnosis not present

## 2019-12-31 DIAGNOSIS — I4891 Unspecified atrial fibrillation: Secondary | ICD-10-CM | POA: Diagnosis not present

## 2019-12-31 DIAGNOSIS — G629 Polyneuropathy, unspecified: Secondary | ICD-10-CM | POA: Diagnosis not present

## 2019-12-31 DIAGNOSIS — I5022 Chronic systolic (congestive) heart failure: Secondary | ICD-10-CM | POA: Diagnosis not present

## 2019-12-31 DIAGNOSIS — R296 Repeated falls: Secondary | ICD-10-CM | POA: Diagnosis not present

## 2019-12-31 DIAGNOSIS — I130 Hypertensive heart and chronic kidney disease with heart failure and stage 1 through stage 4 chronic kidney disease, or unspecified chronic kidney disease: Secondary | ICD-10-CM | POA: Diagnosis not present

## 2019-12-31 DIAGNOSIS — G5793 Unspecified mononeuropathy of bilateral lower limbs: Secondary | ICD-10-CM | POA: Diagnosis not present

## 2019-12-31 DIAGNOSIS — I4892 Unspecified atrial flutter: Secondary | ICD-10-CM | POA: Diagnosis not present

## 2019-12-31 DIAGNOSIS — H8110 Benign paroxysmal vertigo, unspecified ear: Secondary | ICD-10-CM | POA: Diagnosis not present

## 2020-01-04 ENCOUNTER — Other Ambulatory Visit: Payer: Self-pay

## 2020-01-04 ENCOUNTER — Ambulatory Visit (INDEPENDENT_AMBULATORY_CARE_PROVIDER_SITE_OTHER): Payer: Medicare Other | Admitting: General Practice

## 2020-01-04 DIAGNOSIS — G629 Polyneuropathy, unspecified: Secondary | ICD-10-CM | POA: Diagnosis not present

## 2020-01-04 DIAGNOSIS — Z7901 Long term (current) use of anticoagulants: Secondary | ICD-10-CM

## 2020-01-04 DIAGNOSIS — I4892 Unspecified atrial flutter: Secondary | ICD-10-CM | POA: Diagnosis not present

## 2020-01-04 DIAGNOSIS — G5793 Unspecified mononeuropathy of bilateral lower limbs: Secondary | ICD-10-CM | POA: Diagnosis not present

## 2020-01-04 DIAGNOSIS — I130 Hypertensive heart and chronic kidney disease with heart failure and stage 1 through stage 4 chronic kidney disease, or unspecified chronic kidney disease: Secondary | ICD-10-CM | POA: Diagnosis not present

## 2020-01-04 DIAGNOSIS — I4891 Unspecified atrial fibrillation: Secondary | ICD-10-CM | POA: Diagnosis not present

## 2020-01-04 DIAGNOSIS — S62101D Fracture of unspecified carpal bone, right wrist, subsequent encounter for fracture with routine healing: Secondary | ICD-10-CM | POA: Diagnosis not present

## 2020-01-04 DIAGNOSIS — H8110 Benign paroxysmal vertigo, unspecified ear: Secondary | ICD-10-CM | POA: Diagnosis not present

## 2020-01-04 DIAGNOSIS — N189 Chronic kidney disease, unspecified: Secondary | ICD-10-CM | POA: Diagnosis not present

## 2020-01-04 DIAGNOSIS — I5022 Chronic systolic (congestive) heart failure: Secondary | ICD-10-CM | POA: Diagnosis not present

## 2020-01-04 DIAGNOSIS — M1711 Unilateral primary osteoarthritis, right knee: Secondary | ICD-10-CM | POA: Diagnosis not present

## 2020-01-04 DIAGNOSIS — R296 Repeated falls: Secondary | ICD-10-CM | POA: Diagnosis not present

## 2020-01-04 LAB — POCT INR: INR: 2.5 (ref 2.0–3.0)

## 2020-01-04 NOTE — Patient Instructions (Signed)
Pre visit review using our clinic review tool, if applicable. No additional management support is needed unless otherwise documented below in the visit note.  Continue to take 1/2 tablet daily except 1 tablet on Tues, Thurs and Saturday.  Re-check in 4 weeks.  

## 2020-01-05 DIAGNOSIS — G5793 Unspecified mononeuropathy of bilateral lower limbs: Secondary | ICD-10-CM | POA: Diagnosis not present

## 2020-01-05 DIAGNOSIS — I130 Hypertensive heart and chronic kidney disease with heart failure and stage 1 through stage 4 chronic kidney disease, or unspecified chronic kidney disease: Secondary | ICD-10-CM | POA: Diagnosis not present

## 2020-01-05 DIAGNOSIS — I4892 Unspecified atrial flutter: Secondary | ICD-10-CM | POA: Diagnosis not present

## 2020-01-05 DIAGNOSIS — N189 Chronic kidney disease, unspecified: Secondary | ICD-10-CM | POA: Diagnosis not present

## 2020-01-05 DIAGNOSIS — G629 Polyneuropathy, unspecified: Secondary | ICD-10-CM | POA: Diagnosis not present

## 2020-01-05 DIAGNOSIS — S62101D Fracture of unspecified carpal bone, right wrist, subsequent encounter for fracture with routine healing: Secondary | ICD-10-CM | POA: Diagnosis not present

## 2020-01-05 DIAGNOSIS — I4891 Unspecified atrial fibrillation: Secondary | ICD-10-CM | POA: Diagnosis not present

## 2020-01-05 DIAGNOSIS — H8110 Benign paroxysmal vertigo, unspecified ear: Secondary | ICD-10-CM | POA: Diagnosis not present

## 2020-01-05 DIAGNOSIS — R296 Repeated falls: Secondary | ICD-10-CM | POA: Diagnosis not present

## 2020-01-05 DIAGNOSIS — M1711 Unilateral primary osteoarthritis, right knee: Secondary | ICD-10-CM | POA: Diagnosis not present

## 2020-01-05 DIAGNOSIS — I5022 Chronic systolic (congestive) heart failure: Secondary | ICD-10-CM | POA: Diagnosis not present

## 2020-01-06 DIAGNOSIS — S52501D Unspecified fracture of the lower end of right radius, subsequent encounter for closed fracture with routine healing: Secondary | ICD-10-CM | POA: Diagnosis not present

## 2020-01-07 DIAGNOSIS — G629 Polyneuropathy, unspecified: Secondary | ICD-10-CM | POA: Diagnosis not present

## 2020-01-07 DIAGNOSIS — G5793 Unspecified mononeuropathy of bilateral lower limbs: Secondary | ICD-10-CM | POA: Diagnosis not present

## 2020-01-07 DIAGNOSIS — M1711 Unilateral primary osteoarthritis, right knee: Secondary | ICD-10-CM | POA: Diagnosis not present

## 2020-01-07 DIAGNOSIS — I4891 Unspecified atrial fibrillation: Secondary | ICD-10-CM | POA: Diagnosis not present

## 2020-01-07 DIAGNOSIS — H8110 Benign paroxysmal vertigo, unspecified ear: Secondary | ICD-10-CM | POA: Diagnosis not present

## 2020-01-07 DIAGNOSIS — I4892 Unspecified atrial flutter: Secondary | ICD-10-CM | POA: Diagnosis not present

## 2020-01-07 DIAGNOSIS — S62101D Fracture of unspecified carpal bone, right wrist, subsequent encounter for fracture with routine healing: Secondary | ICD-10-CM | POA: Diagnosis not present

## 2020-01-07 DIAGNOSIS — I130 Hypertensive heart and chronic kidney disease with heart failure and stage 1 through stage 4 chronic kidney disease, or unspecified chronic kidney disease: Secondary | ICD-10-CM | POA: Diagnosis not present

## 2020-01-07 DIAGNOSIS — N189 Chronic kidney disease, unspecified: Secondary | ICD-10-CM | POA: Diagnosis not present

## 2020-01-07 DIAGNOSIS — I5022 Chronic systolic (congestive) heart failure: Secondary | ICD-10-CM | POA: Diagnosis not present

## 2020-01-07 DIAGNOSIS — R296 Repeated falls: Secondary | ICD-10-CM | POA: Diagnosis not present

## 2020-01-12 DIAGNOSIS — I4891 Unspecified atrial fibrillation: Secondary | ICD-10-CM | POA: Diagnosis not present

## 2020-01-12 DIAGNOSIS — M1711 Unilateral primary osteoarthritis, right knee: Secondary | ICD-10-CM | POA: Diagnosis not present

## 2020-01-12 DIAGNOSIS — I130 Hypertensive heart and chronic kidney disease with heart failure and stage 1 through stage 4 chronic kidney disease, or unspecified chronic kidney disease: Secondary | ICD-10-CM | POA: Diagnosis not present

## 2020-01-12 DIAGNOSIS — I5022 Chronic systolic (congestive) heart failure: Secondary | ICD-10-CM | POA: Diagnosis not present

## 2020-01-12 DIAGNOSIS — G629 Polyneuropathy, unspecified: Secondary | ICD-10-CM | POA: Diagnosis not present

## 2020-01-12 DIAGNOSIS — R296 Repeated falls: Secondary | ICD-10-CM | POA: Diagnosis not present

## 2020-01-12 DIAGNOSIS — S62101D Fracture of unspecified carpal bone, right wrist, subsequent encounter for fracture with routine healing: Secondary | ICD-10-CM | POA: Diagnosis not present

## 2020-01-12 DIAGNOSIS — H8110 Benign paroxysmal vertigo, unspecified ear: Secondary | ICD-10-CM | POA: Diagnosis not present

## 2020-01-12 DIAGNOSIS — I4892 Unspecified atrial flutter: Secondary | ICD-10-CM | POA: Diagnosis not present

## 2020-01-12 DIAGNOSIS — G5793 Unspecified mononeuropathy of bilateral lower limbs: Secondary | ICD-10-CM | POA: Diagnosis not present

## 2020-01-12 DIAGNOSIS — N189 Chronic kidney disease, unspecified: Secondary | ICD-10-CM | POA: Diagnosis not present

## 2020-01-14 DIAGNOSIS — I5022 Chronic systolic (congestive) heart failure: Secondary | ICD-10-CM | POA: Diagnosis not present

## 2020-01-14 DIAGNOSIS — N189 Chronic kidney disease, unspecified: Secondary | ICD-10-CM | POA: Diagnosis not present

## 2020-01-14 DIAGNOSIS — G629 Polyneuropathy, unspecified: Secondary | ICD-10-CM | POA: Diagnosis not present

## 2020-01-14 DIAGNOSIS — S62101D Fracture of unspecified carpal bone, right wrist, subsequent encounter for fracture with routine healing: Secondary | ICD-10-CM | POA: Diagnosis not present

## 2020-01-14 DIAGNOSIS — I4892 Unspecified atrial flutter: Secondary | ICD-10-CM | POA: Diagnosis not present

## 2020-01-14 DIAGNOSIS — R296 Repeated falls: Secondary | ICD-10-CM | POA: Diagnosis not present

## 2020-01-14 DIAGNOSIS — I4891 Unspecified atrial fibrillation: Secondary | ICD-10-CM | POA: Diagnosis not present

## 2020-01-14 DIAGNOSIS — M1711 Unilateral primary osteoarthritis, right knee: Secondary | ICD-10-CM | POA: Diagnosis not present

## 2020-01-14 DIAGNOSIS — H8110 Benign paroxysmal vertigo, unspecified ear: Secondary | ICD-10-CM | POA: Diagnosis not present

## 2020-01-14 DIAGNOSIS — G5793 Unspecified mononeuropathy of bilateral lower limbs: Secondary | ICD-10-CM | POA: Diagnosis not present

## 2020-01-14 DIAGNOSIS — I130 Hypertensive heart and chronic kidney disease with heart failure and stage 1 through stage 4 chronic kidney disease, or unspecified chronic kidney disease: Secondary | ICD-10-CM | POA: Diagnosis not present

## 2020-01-18 DIAGNOSIS — G5793 Unspecified mononeuropathy of bilateral lower limbs: Secondary | ICD-10-CM | POA: Diagnosis not present

## 2020-01-18 DIAGNOSIS — R296 Repeated falls: Secondary | ICD-10-CM | POA: Diagnosis not present

## 2020-01-18 DIAGNOSIS — H8110 Benign paroxysmal vertigo, unspecified ear: Secondary | ICD-10-CM | POA: Diagnosis not present

## 2020-01-18 DIAGNOSIS — G629 Polyneuropathy, unspecified: Secondary | ICD-10-CM | POA: Diagnosis not present

## 2020-01-18 DIAGNOSIS — I5022 Chronic systolic (congestive) heart failure: Secondary | ICD-10-CM | POA: Diagnosis not present

## 2020-01-18 DIAGNOSIS — M1711 Unilateral primary osteoarthritis, right knee: Secondary | ICD-10-CM | POA: Diagnosis not present

## 2020-01-18 DIAGNOSIS — I4892 Unspecified atrial flutter: Secondary | ICD-10-CM | POA: Diagnosis not present

## 2020-01-18 DIAGNOSIS — S62101D Fracture of unspecified carpal bone, right wrist, subsequent encounter for fracture with routine healing: Secondary | ICD-10-CM | POA: Diagnosis not present

## 2020-01-18 DIAGNOSIS — I130 Hypertensive heart and chronic kidney disease with heart failure and stage 1 through stage 4 chronic kidney disease, or unspecified chronic kidney disease: Secondary | ICD-10-CM | POA: Diagnosis not present

## 2020-01-18 DIAGNOSIS — I4891 Unspecified atrial fibrillation: Secondary | ICD-10-CM | POA: Diagnosis not present

## 2020-01-18 DIAGNOSIS — N189 Chronic kidney disease, unspecified: Secondary | ICD-10-CM | POA: Diagnosis not present

## 2020-01-20 DIAGNOSIS — G5793 Unspecified mononeuropathy of bilateral lower limbs: Secondary | ICD-10-CM | POA: Diagnosis not present

## 2020-01-20 DIAGNOSIS — I5022 Chronic systolic (congestive) heart failure: Secondary | ICD-10-CM | POA: Diagnosis not present

## 2020-01-20 DIAGNOSIS — M1711 Unilateral primary osteoarthritis, right knee: Secondary | ICD-10-CM | POA: Diagnosis not present

## 2020-01-20 DIAGNOSIS — H8110 Benign paroxysmal vertigo, unspecified ear: Secondary | ICD-10-CM | POA: Diagnosis not present

## 2020-01-20 DIAGNOSIS — I130 Hypertensive heart and chronic kidney disease with heart failure and stage 1 through stage 4 chronic kidney disease, or unspecified chronic kidney disease: Secondary | ICD-10-CM | POA: Diagnosis not present

## 2020-01-20 DIAGNOSIS — G629 Polyneuropathy, unspecified: Secondary | ICD-10-CM | POA: Diagnosis not present

## 2020-01-20 DIAGNOSIS — R296 Repeated falls: Secondary | ICD-10-CM | POA: Diagnosis not present

## 2020-01-20 DIAGNOSIS — I4891 Unspecified atrial fibrillation: Secondary | ICD-10-CM | POA: Diagnosis not present

## 2020-01-20 DIAGNOSIS — S62101D Fracture of unspecified carpal bone, right wrist, subsequent encounter for fracture with routine healing: Secondary | ICD-10-CM | POA: Diagnosis not present

## 2020-01-20 DIAGNOSIS — N189 Chronic kidney disease, unspecified: Secondary | ICD-10-CM | POA: Diagnosis not present

## 2020-01-20 DIAGNOSIS — I4892 Unspecified atrial flutter: Secondary | ICD-10-CM | POA: Diagnosis not present

## 2020-01-21 DIAGNOSIS — N189 Chronic kidney disease, unspecified: Secondary | ICD-10-CM | POA: Diagnosis not present

## 2020-01-21 DIAGNOSIS — G629 Polyneuropathy, unspecified: Secondary | ICD-10-CM | POA: Diagnosis not present

## 2020-01-21 DIAGNOSIS — R296 Repeated falls: Secondary | ICD-10-CM | POA: Diagnosis not present

## 2020-01-21 DIAGNOSIS — I5022 Chronic systolic (congestive) heart failure: Secondary | ICD-10-CM | POA: Diagnosis not present

## 2020-01-21 DIAGNOSIS — S62101D Fracture of unspecified carpal bone, right wrist, subsequent encounter for fracture with routine healing: Secondary | ICD-10-CM | POA: Diagnosis not present

## 2020-01-21 DIAGNOSIS — H8110 Benign paroxysmal vertigo, unspecified ear: Secondary | ICD-10-CM | POA: Diagnosis not present

## 2020-01-21 DIAGNOSIS — I4892 Unspecified atrial flutter: Secondary | ICD-10-CM | POA: Diagnosis not present

## 2020-01-21 DIAGNOSIS — I130 Hypertensive heart and chronic kidney disease with heart failure and stage 1 through stage 4 chronic kidney disease, or unspecified chronic kidney disease: Secondary | ICD-10-CM | POA: Diagnosis not present

## 2020-01-21 DIAGNOSIS — M1711 Unilateral primary osteoarthritis, right knee: Secondary | ICD-10-CM | POA: Diagnosis not present

## 2020-01-21 DIAGNOSIS — I4891 Unspecified atrial fibrillation: Secondary | ICD-10-CM | POA: Diagnosis not present

## 2020-01-21 DIAGNOSIS — G5793 Unspecified mononeuropathy of bilateral lower limbs: Secondary | ICD-10-CM | POA: Diagnosis not present

## 2020-01-24 ENCOUNTER — Other Ambulatory Visit: Payer: Self-pay | Admitting: Family Medicine

## 2020-01-24 DIAGNOSIS — Z7901 Long term (current) use of anticoagulants: Secondary | ICD-10-CM

## 2020-01-26 DIAGNOSIS — G5793 Unspecified mononeuropathy of bilateral lower limbs: Secondary | ICD-10-CM | POA: Diagnosis not present

## 2020-01-26 DIAGNOSIS — I4891 Unspecified atrial fibrillation: Secondary | ICD-10-CM | POA: Diagnosis not present

## 2020-01-26 DIAGNOSIS — I130 Hypertensive heart and chronic kidney disease with heart failure and stage 1 through stage 4 chronic kidney disease, or unspecified chronic kidney disease: Secondary | ICD-10-CM | POA: Diagnosis not present

## 2020-01-26 DIAGNOSIS — I4892 Unspecified atrial flutter: Secondary | ICD-10-CM | POA: Diagnosis not present

## 2020-01-26 DIAGNOSIS — R296 Repeated falls: Secondary | ICD-10-CM | POA: Diagnosis not present

## 2020-01-26 DIAGNOSIS — G629 Polyneuropathy, unspecified: Secondary | ICD-10-CM | POA: Diagnosis not present

## 2020-01-26 DIAGNOSIS — N189 Chronic kidney disease, unspecified: Secondary | ICD-10-CM | POA: Diagnosis not present

## 2020-01-26 DIAGNOSIS — M1711 Unilateral primary osteoarthritis, right knee: Secondary | ICD-10-CM | POA: Diagnosis not present

## 2020-01-26 DIAGNOSIS — H8110 Benign paroxysmal vertigo, unspecified ear: Secondary | ICD-10-CM | POA: Diagnosis not present

## 2020-01-26 DIAGNOSIS — S62101D Fracture of unspecified carpal bone, right wrist, subsequent encounter for fracture with routine healing: Secondary | ICD-10-CM | POA: Diagnosis not present

## 2020-01-26 DIAGNOSIS — I5022 Chronic systolic (congestive) heart failure: Secondary | ICD-10-CM | POA: Diagnosis not present

## 2020-01-28 DIAGNOSIS — H8110 Benign paroxysmal vertigo, unspecified ear: Secondary | ICD-10-CM | POA: Diagnosis not present

## 2020-01-28 DIAGNOSIS — I4892 Unspecified atrial flutter: Secondary | ICD-10-CM | POA: Diagnosis not present

## 2020-01-28 DIAGNOSIS — I4891 Unspecified atrial fibrillation: Secondary | ICD-10-CM | POA: Diagnosis not present

## 2020-01-28 DIAGNOSIS — S62101D Fracture of unspecified carpal bone, right wrist, subsequent encounter for fracture with routine healing: Secondary | ICD-10-CM | POA: Diagnosis not present

## 2020-01-28 DIAGNOSIS — I5022 Chronic systolic (congestive) heart failure: Secondary | ICD-10-CM | POA: Diagnosis not present

## 2020-01-28 DIAGNOSIS — M1711 Unilateral primary osteoarthritis, right knee: Secondary | ICD-10-CM | POA: Diagnosis not present

## 2020-01-28 DIAGNOSIS — I130 Hypertensive heart and chronic kidney disease with heart failure and stage 1 through stage 4 chronic kidney disease, or unspecified chronic kidney disease: Secondary | ICD-10-CM | POA: Diagnosis not present

## 2020-01-28 DIAGNOSIS — G629 Polyneuropathy, unspecified: Secondary | ICD-10-CM | POA: Diagnosis not present

## 2020-01-28 DIAGNOSIS — R296 Repeated falls: Secondary | ICD-10-CM | POA: Diagnosis not present

## 2020-01-28 DIAGNOSIS — G5793 Unspecified mononeuropathy of bilateral lower limbs: Secondary | ICD-10-CM | POA: Diagnosis not present

## 2020-01-28 DIAGNOSIS — N189 Chronic kidney disease, unspecified: Secondary | ICD-10-CM | POA: Diagnosis not present

## 2020-02-01 ENCOUNTER — Ambulatory Visit (INDEPENDENT_AMBULATORY_CARE_PROVIDER_SITE_OTHER): Payer: Medicare Other | Admitting: General Practice

## 2020-02-01 ENCOUNTER — Other Ambulatory Visit: Payer: Self-pay

## 2020-02-01 DIAGNOSIS — I4891 Unspecified atrial fibrillation: Secondary | ICD-10-CM | POA: Diagnosis not present

## 2020-02-01 DIAGNOSIS — Z7901 Long term (current) use of anticoagulants: Secondary | ICD-10-CM

## 2020-02-01 LAB — POCT INR: INR: 2.5 (ref 2.0–3.0)

## 2020-02-01 NOTE — Patient Instructions (Signed)
Pre visit review using our clinic review tool, if applicable. No additional management support is needed unless otherwise documented below in the visit note.  Continue to take 1/2 tablet daily except 1 tablet on Tues, Thurs and Saturday.  Re-check in 6 weeks.

## 2020-02-02 DIAGNOSIS — I130 Hypertensive heart and chronic kidney disease with heart failure and stage 1 through stage 4 chronic kidney disease, or unspecified chronic kidney disease: Secondary | ICD-10-CM | POA: Diagnosis not present

## 2020-02-02 DIAGNOSIS — M1711 Unilateral primary osteoarthritis, right knee: Secondary | ICD-10-CM | POA: Diagnosis not present

## 2020-02-02 DIAGNOSIS — N189 Chronic kidney disease, unspecified: Secondary | ICD-10-CM | POA: Diagnosis not present

## 2020-02-02 DIAGNOSIS — G629 Polyneuropathy, unspecified: Secondary | ICD-10-CM | POA: Diagnosis not present

## 2020-02-02 DIAGNOSIS — R296 Repeated falls: Secondary | ICD-10-CM | POA: Diagnosis not present

## 2020-02-02 DIAGNOSIS — I4892 Unspecified atrial flutter: Secondary | ICD-10-CM | POA: Diagnosis not present

## 2020-02-02 DIAGNOSIS — I5022 Chronic systolic (congestive) heart failure: Secondary | ICD-10-CM | POA: Diagnosis not present

## 2020-02-02 DIAGNOSIS — G5793 Unspecified mononeuropathy of bilateral lower limbs: Secondary | ICD-10-CM | POA: Diagnosis not present

## 2020-02-02 DIAGNOSIS — I4891 Unspecified atrial fibrillation: Secondary | ICD-10-CM | POA: Diagnosis not present

## 2020-02-02 DIAGNOSIS — S62101D Fracture of unspecified carpal bone, right wrist, subsequent encounter for fracture with routine healing: Secondary | ICD-10-CM | POA: Diagnosis not present

## 2020-02-02 DIAGNOSIS — H8110 Benign paroxysmal vertigo, unspecified ear: Secondary | ICD-10-CM | POA: Diagnosis not present

## 2020-02-03 DIAGNOSIS — G629 Polyneuropathy, unspecified: Secondary | ICD-10-CM | POA: Diagnosis not present

## 2020-02-03 DIAGNOSIS — I130 Hypertensive heart and chronic kidney disease with heart failure and stage 1 through stage 4 chronic kidney disease, or unspecified chronic kidney disease: Secondary | ICD-10-CM | POA: Diagnosis not present

## 2020-02-03 DIAGNOSIS — I4892 Unspecified atrial flutter: Secondary | ICD-10-CM | POA: Diagnosis not present

## 2020-02-03 DIAGNOSIS — I4891 Unspecified atrial fibrillation: Secondary | ICD-10-CM | POA: Diagnosis not present

## 2020-02-03 DIAGNOSIS — M1711 Unilateral primary osteoarthritis, right knee: Secondary | ICD-10-CM | POA: Diagnosis not present

## 2020-02-03 DIAGNOSIS — N189 Chronic kidney disease, unspecified: Secondary | ICD-10-CM | POA: Diagnosis not present

## 2020-02-03 DIAGNOSIS — R296 Repeated falls: Secondary | ICD-10-CM | POA: Diagnosis not present

## 2020-02-03 DIAGNOSIS — H8110 Benign paroxysmal vertigo, unspecified ear: Secondary | ICD-10-CM | POA: Diagnosis not present

## 2020-02-03 DIAGNOSIS — I5022 Chronic systolic (congestive) heart failure: Secondary | ICD-10-CM | POA: Diagnosis not present

## 2020-02-03 DIAGNOSIS — S62101D Fracture of unspecified carpal bone, right wrist, subsequent encounter for fracture with routine healing: Secondary | ICD-10-CM | POA: Diagnosis not present

## 2020-02-03 DIAGNOSIS — G5793 Unspecified mononeuropathy of bilateral lower limbs: Secondary | ICD-10-CM | POA: Diagnosis not present

## 2020-02-05 ENCOUNTER — Encounter: Payer: Self-pay | Admitting: Family Medicine

## 2020-02-05 ENCOUNTER — Telehealth (INDEPENDENT_AMBULATORY_CARE_PROVIDER_SITE_OTHER): Payer: Medicare Other | Admitting: Family Medicine

## 2020-02-05 VITALS — Wt 153.0 lb

## 2020-02-05 DIAGNOSIS — R0982 Postnasal drip: Secondary | ICD-10-CM

## 2020-02-05 DIAGNOSIS — Z7189 Other specified counseling: Secondary | ICD-10-CM | POA: Diagnosis not present

## 2020-02-05 DIAGNOSIS — J029 Acute pharyngitis, unspecified: Secondary | ICD-10-CM

## 2020-02-05 MED ORDER — FLUTICASONE PROPIONATE 50 MCG/ACT NA SUSP
1.0000 | Freq: Every day | NASAL | 0 refills | Status: DC
Start: 2020-02-05 — End: 2020-03-18

## 2020-02-05 NOTE — Progress Notes (Signed)
Virtual Visit via Telephone Note  I connected with [name] on 02/05/20 at  9:00 AM EST by telephone and verified that I am speaking with the correct person using two identifiers.   I discussed the limitations, risks, security and privacy concerns of performing an evaluation and management service by telephone and the availability of in person appointments. I also discussed with the patient that there may be a patient responsible charge related to this service. The patient expressed understanding and agreed to proceed.  Location patient: home Location provider: work or home office Participants present for the call: patient, provider, pt's son, [name] on the phone Patient did not have a visit in the prior 7 days to address this/these issue(s).   History of Present Illness: Pt's son prefers to provide the majority of the hx.  Pt is a 87 yo female with pmh sig for HTN, hemorrhoids, systolic CHF, Afib on chronic anticoagulation, h/o SBO, GERD, OA, osteopenia, CKD, vitamin b 12 def, vertigo, HLD who was seen for ongoing concern.  Pt with sore throat going into the second week.  Becoming worse, 6/10 pain.  Having post nasal drainage, cough, sneezing.  Denies HA, fever, diarrhea.  Gargling with warm salt water and using Halls lozenges.  Pt denies sick contacts, though has in home PT and OT.  Of note one of the therapist has not been able to come by for the last wk.  Unsure if they are out sick.      Son denies seeing white patches in the back of pt's throat.     Pt was in clinic on Monday for INR check.  Observations/Objective: Patient sounds cheerful and well on the phone. I do not appreciate any SOB. Speech and thought processing are grossly intact. Patient reported vitals:   Assessment and Plan: Sore throat  Educated about COVID-19 virus infection  Post-nasal drainage - Plan: fluticasone (FLONASE) 50 MCG/ACT nasal spray -discussed s/s of COVID-19 infection. -sore throat  possibly 2/2 viral infection, bacterial, nasal drainage, GERD, or thrush -continue supportive care -consider gargling with chloraseptic spray -flonase prn -pt advised to have COVID testing and strep testing -given area sites offering testing -given strict precautions   Follow Up Instructions: F/u prn   [id number] 5-10 [id number] 11-20 9443 21-30 I did not refer this patient for an OV in the next 24 hours for this/these issue(s).  I discussed the assessment and treatment plan with the patient. The patient was provided an opportunity to ask questions and all were answered. The patient agreed with the plan and demonstrated an understanding of the instructions.   The patient was advised to call back or seek an in-person evaluation if the symptoms worsen or if the condition fails to improve as anticipated.  I provided 16 minutes of non-face-to-face time during this encounter.  [name] Saint, MD

## 2020-02-08 ENCOUNTER — Ambulatory Visit: Payer: Medicare Other | Admitting: Podiatry

## 2020-02-08 ENCOUNTER — Other Ambulatory Visit: Payer: Self-pay

## 2020-02-08 ENCOUNTER — Encounter: Payer: Self-pay | Admitting: Podiatry

## 2020-02-08 DIAGNOSIS — L600 Ingrowing nail: Secondary | ICD-10-CM | POA: Diagnosis not present

## 2020-02-08 DIAGNOSIS — B351 Tinea unguium: Secondary | ICD-10-CM | POA: Diagnosis not present

## 2020-02-08 DIAGNOSIS — I83892 Varicose veins of left lower extremities with other complications: Secondary | ICD-10-CM

## 2020-02-08 DIAGNOSIS — M79675 Pain in left toe(s): Secondary | ICD-10-CM

## 2020-02-08 DIAGNOSIS — L608 Other nail disorders: Secondary | ICD-10-CM | POA: Diagnosis not present

## 2020-02-08 DIAGNOSIS — M722 Plantar fascial fibromatosis: Secondary | ICD-10-CM

## 2020-02-08 DIAGNOSIS — M79674 Pain in right toe(s): Secondary | ICD-10-CM

## 2020-02-08 DIAGNOSIS — M7672 Peroneal tendinitis, left leg: Secondary | ICD-10-CM

## 2020-02-08 NOTE — Progress Notes (Signed)
  Subjective:  Patient ID: [name], female    DOB: May 01, 1926,  MRN: [id number]  Chief Complaint  Patient presents with  . Nail Problem    Possible nail fungus, Nail trim     87 y.o. female returns with the above complaint. History confirmed with patient.  She is here today again with her son.  Since last visit she fell and broke her wrist.  She is walking somewhat better now.  The ankle and heel are feeling better.  Her main complaint today is thickened, discolored and elongated toenails which are ingrowing into the corner and causing pain  Objective:  Physical Exam: warm, good capillary refill and venous insufficiency noted.  Mild peripheral edema.  Pincer nail deformity with ingrowing toenails both borders of bilateral hallux, she is onychomycosis with thickening, yellow discoloration, crumbly subungual debris of all toenails.  No pain in the heels or ankle today  Assessment:   1. Pain due to onychomycosis of toenails of both feet   2. Ingrowing right great toenail   3. Ingrowing left great toenail   4. Pincer nail deformity      Plan:  Patient was evaluated and treated and all questions answered.  Plantar Fasciitis and peroneal tendinitis -Doing much better.  Hopeful she can begin to walk and be a little more active as tolerated.  Discussed the etiology and treatment options for the condition in detail with the patient. Educated patient on the topical and oral treatment options for mycotic nails. Recommended debridement of the nails today. Sharp and mechanical debridement performed of all painful and mycotic nails today. Nails debrided in length and thickness using a nail nipper to level of comfort. Discussed treatment options including appropriate shoe gear. Follow up as needed for painful nails.     Sharl Ma, DPM 02/08/2020      Return in about 3 months (around 05/07/2020) for nail care.

## 2020-02-09 DIAGNOSIS — N189 Chronic kidney disease, unspecified: Secondary | ICD-10-CM | POA: Diagnosis not present

## 2020-02-09 DIAGNOSIS — G5793 Unspecified mononeuropathy of bilateral lower limbs: Secondary | ICD-10-CM | POA: Diagnosis not present

## 2020-02-09 DIAGNOSIS — G629 Polyneuropathy, unspecified: Secondary | ICD-10-CM | POA: Diagnosis not present

## 2020-02-09 DIAGNOSIS — I5022 Chronic systolic (congestive) heart failure: Secondary | ICD-10-CM | POA: Diagnosis not present

## 2020-02-09 DIAGNOSIS — I4892 Unspecified atrial flutter: Secondary | ICD-10-CM | POA: Diagnosis not present

## 2020-02-09 DIAGNOSIS — I4891 Unspecified atrial fibrillation: Secondary | ICD-10-CM | POA: Diagnosis not present

## 2020-02-09 DIAGNOSIS — S62101D Fracture of unspecified carpal bone, right wrist, subsequent encounter for fracture with routine healing: Secondary | ICD-10-CM | POA: Diagnosis not present

## 2020-02-09 DIAGNOSIS — I130 Hypertensive heart and chronic kidney disease with heart failure and stage 1 through stage 4 chronic kidney disease, or unspecified chronic kidney disease: Secondary | ICD-10-CM | POA: Diagnosis not present

## 2020-02-09 DIAGNOSIS — H8110 Benign paroxysmal vertigo, unspecified ear: Secondary | ICD-10-CM | POA: Diagnosis not present

## 2020-02-09 DIAGNOSIS — R296 Repeated falls: Secondary | ICD-10-CM | POA: Diagnosis not present

## 2020-02-09 DIAGNOSIS — M1711 Unilateral primary osteoarthritis, right knee: Secondary | ICD-10-CM | POA: Diagnosis not present

## 2020-02-10 DIAGNOSIS — Z03818 Encounter for observation for suspected exposure to other biological agents ruled out: Secondary | ICD-10-CM | POA: Diagnosis not present

## 2020-02-10 DIAGNOSIS — R0982 Postnasal drip: Secondary | ICD-10-CM | POA: Diagnosis not present

## 2020-02-10 DIAGNOSIS — J029 Acute pharyngitis, unspecified: Secondary | ICD-10-CM | POA: Diagnosis not present

## 2020-02-10 DIAGNOSIS — Z20822 Contact with and (suspected) exposure to covid-19: Secondary | ICD-10-CM | POA: Diagnosis not present

## 2020-02-12 DIAGNOSIS — G5793 Unspecified mononeuropathy of bilateral lower limbs: Secondary | ICD-10-CM | POA: Diagnosis not present

## 2020-02-12 DIAGNOSIS — I4892 Unspecified atrial flutter: Secondary | ICD-10-CM | POA: Diagnosis not present

## 2020-02-12 DIAGNOSIS — H8110 Benign paroxysmal vertigo, unspecified ear: Secondary | ICD-10-CM | POA: Diagnosis not present

## 2020-02-12 DIAGNOSIS — N189 Chronic kidney disease, unspecified: Secondary | ICD-10-CM | POA: Diagnosis not present

## 2020-02-12 DIAGNOSIS — I4891 Unspecified atrial fibrillation: Secondary | ICD-10-CM | POA: Diagnosis not present

## 2020-02-12 DIAGNOSIS — S62101D Fracture of unspecified carpal bone, right wrist, subsequent encounter for fracture with routine healing: Secondary | ICD-10-CM | POA: Diagnosis not present

## 2020-02-12 DIAGNOSIS — R296 Repeated falls: Secondary | ICD-10-CM | POA: Diagnosis not present

## 2020-02-12 DIAGNOSIS — M1711 Unilateral primary osteoarthritis, right knee: Secondary | ICD-10-CM | POA: Diagnosis not present

## 2020-02-12 DIAGNOSIS — I130 Hypertensive heart and chronic kidney disease with heart failure and stage 1 through stage 4 chronic kidney disease, or unspecified chronic kidney disease: Secondary | ICD-10-CM | POA: Diagnosis not present

## 2020-02-12 DIAGNOSIS — G629 Polyneuropathy, unspecified: Secondary | ICD-10-CM | POA: Diagnosis not present

## 2020-02-12 DIAGNOSIS — I5022 Chronic systolic (congestive) heart failure: Secondary | ICD-10-CM | POA: Diagnosis not present

## 2020-02-15 ENCOUNTER — Telehealth: Payer: Self-pay | Admitting: Family Medicine

## 2020-02-15 DIAGNOSIS — H8110 Benign paroxysmal vertigo, unspecified ear: Secondary | ICD-10-CM | POA: Diagnosis not present

## 2020-02-15 DIAGNOSIS — I4892 Unspecified atrial flutter: Secondary | ICD-10-CM | POA: Diagnosis not present

## 2020-02-15 DIAGNOSIS — G5793 Unspecified mononeuropathy of bilateral lower limbs: Secondary | ICD-10-CM | POA: Diagnosis not present

## 2020-02-15 DIAGNOSIS — I5022 Chronic systolic (congestive) heart failure: Secondary | ICD-10-CM | POA: Diagnosis not present

## 2020-02-15 DIAGNOSIS — I130 Hypertensive heart and chronic kidney disease with heart failure and stage 1 through stage 4 chronic kidney disease, or unspecified chronic kidney disease: Secondary | ICD-10-CM | POA: Diagnosis not present

## 2020-02-15 DIAGNOSIS — S62101D Fracture of unspecified carpal bone, right wrist, subsequent encounter for fracture with routine healing: Secondary | ICD-10-CM | POA: Diagnosis not present

## 2020-02-15 DIAGNOSIS — M1711 Unilateral primary osteoarthritis, right knee: Secondary | ICD-10-CM | POA: Diagnosis not present

## 2020-02-15 DIAGNOSIS — I4891 Unspecified atrial fibrillation: Secondary | ICD-10-CM | POA: Diagnosis not present

## 2020-02-15 DIAGNOSIS — N189 Chronic kidney disease, unspecified: Secondary | ICD-10-CM | POA: Diagnosis not present

## 2020-02-15 DIAGNOSIS — R296 Repeated falls: Secondary | ICD-10-CM | POA: Diagnosis not present

## 2020-02-15 DIAGNOSIS — G629 Polyneuropathy, unspecified: Secondary | ICD-10-CM | POA: Diagnosis not present

## 2020-02-15 NOTE — Telephone Encounter (Signed)
Aurelio Jew call and stated she need a order for pt  physical therapy for  8 weeks her # is [phone] she also stated you can leave a message.

## 2020-02-15 NOTE — Telephone Encounter (Signed)
Verbal given for physical therapy. 

## 2020-02-22 DIAGNOSIS — I4892 Unspecified atrial flutter: Secondary | ICD-10-CM | POA: Diagnosis not present

## 2020-02-22 DIAGNOSIS — G5793 Unspecified mononeuropathy of bilateral lower limbs: Secondary | ICD-10-CM | POA: Diagnosis not present

## 2020-02-22 DIAGNOSIS — S62101D Fracture of unspecified carpal bone, right wrist, subsequent encounter for fracture with routine healing: Secondary | ICD-10-CM | POA: Diagnosis not present

## 2020-02-22 DIAGNOSIS — G629 Polyneuropathy, unspecified: Secondary | ICD-10-CM | POA: Diagnosis not present

## 2020-02-22 DIAGNOSIS — I130 Hypertensive heart and chronic kidney disease with heart failure and stage 1 through stage 4 chronic kidney disease, or unspecified chronic kidney disease: Secondary | ICD-10-CM | POA: Diagnosis not present

## 2020-02-22 DIAGNOSIS — M1711 Unilateral primary osteoarthritis, right knee: Secondary | ICD-10-CM | POA: Diagnosis not present

## 2020-02-22 DIAGNOSIS — I4891 Unspecified atrial fibrillation: Secondary | ICD-10-CM | POA: Diagnosis not present

## 2020-02-22 DIAGNOSIS — N189 Chronic kidney disease, unspecified: Secondary | ICD-10-CM | POA: Diagnosis not present

## 2020-02-22 DIAGNOSIS — I5022 Chronic systolic (congestive) heart failure: Secondary | ICD-10-CM | POA: Diagnosis not present

## 2020-02-22 DIAGNOSIS — R296 Repeated falls: Secondary | ICD-10-CM | POA: Diagnosis not present

## 2020-02-22 DIAGNOSIS — H8110 Benign paroxysmal vertigo, unspecified ear: Secondary | ICD-10-CM | POA: Diagnosis not present

## 2020-02-24 DIAGNOSIS — S62101D Fracture of unspecified carpal bone, right wrist, subsequent encounter for fracture with routine healing: Secondary | ICD-10-CM | POA: Diagnosis not present

## 2020-02-24 DIAGNOSIS — N189 Chronic kidney disease, unspecified: Secondary | ICD-10-CM | POA: Diagnosis not present

## 2020-02-24 DIAGNOSIS — I4891 Unspecified atrial fibrillation: Secondary | ICD-10-CM | POA: Diagnosis not present

## 2020-02-24 DIAGNOSIS — I5022 Chronic systolic (congestive) heart failure: Secondary | ICD-10-CM | POA: Diagnosis not present

## 2020-02-24 DIAGNOSIS — H8110 Benign paroxysmal vertigo, unspecified ear: Secondary | ICD-10-CM | POA: Diagnosis not present

## 2020-02-24 DIAGNOSIS — I130 Hypertensive heart and chronic kidney disease with heart failure and stage 1 through stage 4 chronic kidney disease, or unspecified chronic kidney disease: Secondary | ICD-10-CM | POA: Diagnosis not present

## 2020-02-24 DIAGNOSIS — G5793 Unspecified mononeuropathy of bilateral lower limbs: Secondary | ICD-10-CM | POA: Diagnosis not present

## 2020-02-24 DIAGNOSIS — R296 Repeated falls: Secondary | ICD-10-CM | POA: Diagnosis not present

## 2020-02-24 DIAGNOSIS — G629 Polyneuropathy, unspecified: Secondary | ICD-10-CM | POA: Diagnosis not present

## 2020-02-24 DIAGNOSIS — I4892 Unspecified atrial flutter: Secondary | ICD-10-CM | POA: Diagnosis not present

## 2020-02-24 DIAGNOSIS — M1711 Unilateral primary osteoarthritis, right knee: Secondary | ICD-10-CM | POA: Diagnosis not present

## 2020-02-29 DIAGNOSIS — I4891 Unspecified atrial fibrillation: Secondary | ICD-10-CM | POA: Diagnosis not present

## 2020-02-29 DIAGNOSIS — G629 Polyneuropathy, unspecified: Secondary | ICD-10-CM | POA: Diagnosis not present

## 2020-02-29 DIAGNOSIS — N189 Chronic kidney disease, unspecified: Secondary | ICD-10-CM | POA: Diagnosis not present

## 2020-02-29 DIAGNOSIS — S62101D Fracture of unspecified carpal bone, right wrist, subsequent encounter for fracture with routine healing: Secondary | ICD-10-CM | POA: Diagnosis not present

## 2020-02-29 DIAGNOSIS — G5793 Unspecified mononeuropathy of bilateral lower limbs: Secondary | ICD-10-CM | POA: Diagnosis not present

## 2020-02-29 DIAGNOSIS — M1711 Unilateral primary osteoarthritis, right knee: Secondary | ICD-10-CM | POA: Diagnosis not present

## 2020-02-29 DIAGNOSIS — H8110 Benign paroxysmal vertigo, unspecified ear: Secondary | ICD-10-CM | POA: Diagnosis not present

## 2020-02-29 DIAGNOSIS — I130 Hypertensive heart and chronic kidney disease with heart failure and stage 1 through stage 4 chronic kidney disease, or unspecified chronic kidney disease: Secondary | ICD-10-CM | POA: Diagnosis not present

## 2020-02-29 DIAGNOSIS — I4892 Unspecified atrial flutter: Secondary | ICD-10-CM | POA: Diagnosis not present

## 2020-02-29 DIAGNOSIS — R296 Repeated falls: Secondary | ICD-10-CM | POA: Diagnosis not present

## 2020-02-29 DIAGNOSIS — I5022 Chronic systolic (congestive) heart failure: Secondary | ICD-10-CM | POA: Diagnosis not present

## 2020-03-02 DIAGNOSIS — I4892 Unspecified atrial flutter: Secondary | ICD-10-CM | POA: Diagnosis not present

## 2020-03-02 DIAGNOSIS — G629 Polyneuropathy, unspecified: Secondary | ICD-10-CM | POA: Diagnosis not present

## 2020-03-02 DIAGNOSIS — G5793 Unspecified mononeuropathy of bilateral lower limbs: Secondary | ICD-10-CM | POA: Diagnosis not present

## 2020-03-02 DIAGNOSIS — H8110 Benign paroxysmal vertigo, unspecified ear: Secondary | ICD-10-CM | POA: Diagnosis not present

## 2020-03-02 DIAGNOSIS — I130 Hypertensive heart and chronic kidney disease with heart failure and stage 1 through stage 4 chronic kidney disease, or unspecified chronic kidney disease: Secondary | ICD-10-CM | POA: Diagnosis not present

## 2020-03-02 DIAGNOSIS — M1711 Unilateral primary osteoarthritis, right knee: Secondary | ICD-10-CM | POA: Diagnosis not present

## 2020-03-02 DIAGNOSIS — I5022 Chronic systolic (congestive) heart failure: Secondary | ICD-10-CM | POA: Diagnosis not present

## 2020-03-02 DIAGNOSIS — I4891 Unspecified atrial fibrillation: Secondary | ICD-10-CM | POA: Diagnosis not present

## 2020-03-02 DIAGNOSIS — S62101D Fracture of unspecified carpal bone, right wrist, subsequent encounter for fracture with routine healing: Secondary | ICD-10-CM | POA: Diagnosis not present

## 2020-03-02 DIAGNOSIS — N189 Chronic kidney disease, unspecified: Secondary | ICD-10-CM | POA: Diagnosis not present

## 2020-03-02 DIAGNOSIS — R296 Repeated falls: Secondary | ICD-10-CM | POA: Diagnosis not present

## 2020-03-03 DIAGNOSIS — I130 Hypertensive heart and chronic kidney disease with heart failure and stage 1 through stage 4 chronic kidney disease, or unspecified chronic kidney disease: Secondary | ICD-10-CM | POA: Diagnosis not present

## 2020-03-03 DIAGNOSIS — G629 Polyneuropathy, unspecified: Secondary | ICD-10-CM | POA: Diagnosis not present

## 2020-03-03 DIAGNOSIS — N189 Chronic kidney disease, unspecified: Secondary | ICD-10-CM | POA: Diagnosis not present

## 2020-03-03 DIAGNOSIS — R296 Repeated falls: Secondary | ICD-10-CM | POA: Diagnosis not present

## 2020-03-03 DIAGNOSIS — I4892 Unspecified atrial flutter: Secondary | ICD-10-CM | POA: Diagnosis not present

## 2020-03-03 DIAGNOSIS — H8110 Benign paroxysmal vertigo, unspecified ear: Secondary | ICD-10-CM | POA: Diagnosis not present

## 2020-03-03 DIAGNOSIS — G5793 Unspecified mononeuropathy of bilateral lower limbs: Secondary | ICD-10-CM | POA: Diagnosis not present

## 2020-03-03 DIAGNOSIS — M1711 Unilateral primary osteoarthritis, right knee: Secondary | ICD-10-CM | POA: Diagnosis not present

## 2020-03-03 DIAGNOSIS — I5022 Chronic systolic (congestive) heart failure: Secondary | ICD-10-CM | POA: Diagnosis not present

## 2020-03-03 DIAGNOSIS — S62101D Fracture of unspecified carpal bone, right wrist, subsequent encounter for fracture with routine healing: Secondary | ICD-10-CM | POA: Diagnosis not present

## 2020-03-03 DIAGNOSIS — I4891 Unspecified atrial fibrillation: Secondary | ICD-10-CM | POA: Diagnosis not present

## 2020-03-07 DIAGNOSIS — I4892 Unspecified atrial flutter: Secondary | ICD-10-CM | POA: Diagnosis not present

## 2020-03-07 DIAGNOSIS — I5022 Chronic systolic (congestive) heart failure: Secondary | ICD-10-CM | POA: Diagnosis not present

## 2020-03-07 DIAGNOSIS — H8110 Benign paroxysmal vertigo, unspecified ear: Secondary | ICD-10-CM | POA: Diagnosis not present

## 2020-03-07 DIAGNOSIS — S62101D Fracture of unspecified carpal bone, right wrist, subsequent encounter for fracture with routine healing: Secondary | ICD-10-CM | POA: Diagnosis not present

## 2020-03-07 DIAGNOSIS — R296 Repeated falls: Secondary | ICD-10-CM | POA: Diagnosis not present

## 2020-03-07 DIAGNOSIS — I4891 Unspecified atrial fibrillation: Secondary | ICD-10-CM | POA: Diagnosis not present

## 2020-03-07 DIAGNOSIS — M1711 Unilateral primary osteoarthritis, right knee: Secondary | ICD-10-CM | POA: Diagnosis not present

## 2020-03-07 DIAGNOSIS — G5793 Unspecified mononeuropathy of bilateral lower limbs: Secondary | ICD-10-CM | POA: Diagnosis not present

## 2020-03-07 DIAGNOSIS — N189 Chronic kidney disease, unspecified: Secondary | ICD-10-CM | POA: Diagnosis not present

## 2020-03-07 DIAGNOSIS — I130 Hypertensive heart and chronic kidney disease with heart failure and stage 1 through stage 4 chronic kidney disease, or unspecified chronic kidney disease: Secondary | ICD-10-CM | POA: Diagnosis not present

## 2020-03-07 DIAGNOSIS — G629 Polyneuropathy, unspecified: Secondary | ICD-10-CM | POA: Diagnosis not present

## 2020-03-09 DIAGNOSIS — G629 Polyneuropathy, unspecified: Secondary | ICD-10-CM | POA: Diagnosis not present

## 2020-03-09 DIAGNOSIS — I5022 Chronic systolic (congestive) heart failure: Secondary | ICD-10-CM | POA: Diagnosis not present

## 2020-03-09 DIAGNOSIS — G5793 Unspecified mononeuropathy of bilateral lower limbs: Secondary | ICD-10-CM | POA: Diagnosis not present

## 2020-03-09 DIAGNOSIS — I4892 Unspecified atrial flutter: Secondary | ICD-10-CM | POA: Diagnosis not present

## 2020-03-09 DIAGNOSIS — S62101D Fracture of unspecified carpal bone, right wrist, subsequent encounter for fracture with routine healing: Secondary | ICD-10-CM | POA: Diagnosis not present

## 2020-03-09 DIAGNOSIS — I130 Hypertensive heart and chronic kidney disease with heart failure and stage 1 through stage 4 chronic kidney disease, or unspecified chronic kidney disease: Secondary | ICD-10-CM | POA: Diagnosis not present

## 2020-03-09 DIAGNOSIS — I4891 Unspecified atrial fibrillation: Secondary | ICD-10-CM | POA: Diagnosis not present

## 2020-03-09 DIAGNOSIS — R296 Repeated falls: Secondary | ICD-10-CM | POA: Diagnosis not present

## 2020-03-09 DIAGNOSIS — H8110 Benign paroxysmal vertigo, unspecified ear: Secondary | ICD-10-CM | POA: Diagnosis not present

## 2020-03-09 DIAGNOSIS — M1711 Unilateral primary osteoarthritis, right knee: Secondary | ICD-10-CM | POA: Diagnosis not present

## 2020-03-09 DIAGNOSIS — N189 Chronic kidney disease, unspecified: Secondary | ICD-10-CM | POA: Diagnosis not present

## 2020-03-14 ENCOUNTER — Other Ambulatory Visit: Payer: Self-pay

## 2020-03-14 ENCOUNTER — Ambulatory Visit (INDEPENDENT_AMBULATORY_CARE_PROVIDER_SITE_OTHER): Payer: Medicare Other | Admitting: General Practice

## 2020-03-14 DIAGNOSIS — I130 Hypertensive heart and chronic kidney disease with heart failure and stage 1 through stage 4 chronic kidney disease, or unspecified chronic kidney disease: Secondary | ICD-10-CM | POA: Diagnosis not present

## 2020-03-14 DIAGNOSIS — I4892 Unspecified atrial flutter: Secondary | ICD-10-CM | POA: Diagnosis not present

## 2020-03-14 DIAGNOSIS — G5793 Unspecified mononeuropathy of bilateral lower limbs: Secondary | ICD-10-CM | POA: Diagnosis not present

## 2020-03-14 DIAGNOSIS — I4891 Unspecified atrial fibrillation: Secondary | ICD-10-CM | POA: Diagnosis not present

## 2020-03-14 DIAGNOSIS — N189 Chronic kidney disease, unspecified: Secondary | ICD-10-CM | POA: Diagnosis not present

## 2020-03-14 DIAGNOSIS — G629 Polyneuropathy, unspecified: Secondary | ICD-10-CM | POA: Diagnosis not present

## 2020-03-14 DIAGNOSIS — H8110 Benign paroxysmal vertigo, unspecified ear: Secondary | ICD-10-CM | POA: Diagnosis not present

## 2020-03-14 DIAGNOSIS — R296 Repeated falls: Secondary | ICD-10-CM | POA: Diagnosis not present

## 2020-03-14 DIAGNOSIS — I5022 Chronic systolic (congestive) heart failure: Secondary | ICD-10-CM | POA: Diagnosis not present

## 2020-03-14 DIAGNOSIS — M1711 Unilateral primary osteoarthritis, right knee: Secondary | ICD-10-CM | POA: Diagnosis not present

## 2020-03-14 DIAGNOSIS — Z7901 Long term (current) use of anticoagulants: Secondary | ICD-10-CM

## 2020-03-14 DIAGNOSIS — S62101D Fracture of unspecified carpal bone, right wrist, subsequent encounter for fracture with routine healing: Secondary | ICD-10-CM | POA: Diagnosis not present

## 2020-03-14 LAB — POCT INR: INR: 1.9 — AB (ref 2.0–3.0)

## 2020-03-14 NOTE — Patient Instructions (Signed)
Pre visit review using our clinic review tool, if applicable. No additional management support is needed unless otherwise documented below in the visit note.  Take 1 tablet today (3/7) and then continue to take 1/2 tablet daily except 1 tablet on Tues, Thurs and Saturday.  Re-check in 4 weeks.

## 2020-03-16 DIAGNOSIS — H8110 Benign paroxysmal vertigo, unspecified ear: Secondary | ICD-10-CM | POA: Diagnosis not present

## 2020-03-16 DIAGNOSIS — S62101D Fracture of unspecified carpal bone, right wrist, subsequent encounter for fracture with routine healing: Secondary | ICD-10-CM | POA: Diagnosis not present

## 2020-03-16 DIAGNOSIS — M1711 Unilateral primary osteoarthritis, right knee: Secondary | ICD-10-CM | POA: Diagnosis not present

## 2020-03-16 DIAGNOSIS — I4892 Unspecified atrial flutter: Secondary | ICD-10-CM | POA: Diagnosis not present

## 2020-03-16 DIAGNOSIS — N189 Chronic kidney disease, unspecified: Secondary | ICD-10-CM | POA: Diagnosis not present

## 2020-03-16 DIAGNOSIS — I4891 Unspecified atrial fibrillation: Secondary | ICD-10-CM | POA: Diagnosis not present

## 2020-03-16 DIAGNOSIS — G629 Polyneuropathy, unspecified: Secondary | ICD-10-CM | POA: Diagnosis not present

## 2020-03-16 DIAGNOSIS — I130 Hypertensive heart and chronic kidney disease with heart failure and stage 1 through stage 4 chronic kidney disease, or unspecified chronic kidney disease: Secondary | ICD-10-CM | POA: Diagnosis not present

## 2020-03-16 DIAGNOSIS — G5793 Unspecified mononeuropathy of bilateral lower limbs: Secondary | ICD-10-CM | POA: Diagnosis not present

## 2020-03-16 DIAGNOSIS — I5022 Chronic systolic (congestive) heart failure: Secondary | ICD-10-CM | POA: Diagnosis not present

## 2020-03-16 DIAGNOSIS — R296 Repeated falls: Secondary | ICD-10-CM | POA: Diagnosis not present

## 2020-03-17 ENCOUNTER — Other Ambulatory Visit: Payer: Self-pay

## 2020-03-18 ENCOUNTER — Other Ambulatory Visit: Payer: Self-pay | Admitting: Family Medicine

## 2020-03-18 ENCOUNTER — Ambulatory Visit (INDEPENDENT_AMBULATORY_CARE_PROVIDER_SITE_OTHER): Payer: Medicare Other | Admitting: Family Medicine

## 2020-03-18 VITALS — BP 116/76 | HR 82 | Temp 98.2°F | Ht 65.5 in | Wt 158.6 lb

## 2020-03-18 DIAGNOSIS — I5022 Chronic systolic (congestive) heart failure: Secondary | ICD-10-CM

## 2020-03-18 DIAGNOSIS — Z7901 Long term (current) use of anticoagulants: Secondary | ICD-10-CM

## 2020-03-18 DIAGNOSIS — B379 Candidiasis, unspecified: Secondary | ICD-10-CM | POA: Diagnosis not present

## 2020-03-18 DIAGNOSIS — I4811 Longstanding persistent atrial fibrillation: Secondary | ICD-10-CM | POA: Diagnosis not present

## 2020-03-18 DIAGNOSIS — I10 Essential (primary) hypertension: Secondary | ICD-10-CM

## 2020-03-18 DIAGNOSIS — E538 Deficiency of other specified B group vitamins: Secondary | ICD-10-CM

## 2020-03-18 LAB — BASIC METABOLIC PANEL
BUN: 15 mg/dL (ref 6–23)
CO2: 29 mEq/L (ref 19–32)
Calcium: 9.1 mg/dL (ref 8.4–10.5)
Chloride: 103 mEq/L (ref 96–112)
Creatinine, Ser: 0.9 mg/dL (ref 0.40–1.20)
GFR: 55.92 mL/min — ABNORMAL LOW (ref >60.00)
Glucose, Bld: 87 mg/dL (ref 70–99)
Potassium: 3.7 mEq/L (ref 3.5–5.1)
Sodium: 142 mEq/L (ref 135–145)

## 2020-03-18 LAB — VITAMIN B12: Vitamin B-12: >1506 pg/mL — ABNORMAL HIGH (ref 211–911)

## 2020-03-18 MED ORDER — NYSTATIN 100000 UNIT/GM EX CREA: 1.0000 "application " | TOPICAL_CREAM | Freq: Two times a day (BID) | CUTANEOUS | 1 refills | Status: DC

## 2020-03-18 NOTE — Progress Notes (Signed)
Established Patient Office Visit  Subjective:  Patient ID: [name], female    DOB: 03-20-1926  Age: 87 y.o. MRN: [id number]  CC:  Chief Complaint  Patient presents with  . Follow-up    Follow up   test     HPI [name] presents for medical follow-up accompanied by son.  His medical problems include history of A. fib/flutter, chronic systolic heart failure, hypertension, chronic neuropathy.  She has had significant balance issues.  Has been working with home physical therapy and OT to make some home adjustments.  No falls since last visit.  She has skin rash under her breast predominantly left breast.  Occasionally pruritic.  Much more erythematous couple weeks ago.  Has been going without her bra recently at home.  Applied some topical hydrocortisone cream.  Not fully clearing.  A. fib history.  She is on Coumadin and metoprolol.  Followed by Coumadin clinic regularly.  Takes Lasix 40 mg in the morning and half tablet in the afternoon.  Her home weight is apparently ranging around 154- 158 pounds.  This is stable.  Son does think she is had some increased edema recently.  He occasionally gives her a full extra Lasix in the afternoons and that seems to help.  They do elevation and compression but not consistently.  She denies any orthopnea or increased dyspnea.  Past Medical History:  Diagnosis Date  . Atrial fib/flutter, transient   . B12 DEFICIENCY 11/25/2009  . CHF 01/27/2009  . Chronic systolic heart failure (HCC) 07/25/2009   a. Myoview 5/12:  Low risk, no ischemia, apical lateral defect probably breast attenuation, inf HK, EF 45%.  b. Echo 8/12: EF 45-50%, inf/inf-septal and apical HK, mild LVH, mild to mod MR, mild to mod TR, PASP 50;  c.  Echo 3/14:  Mild LVH, EF 20%, diff HK, mild to mod MR, mild LAE, mild to mod reduced RVSF, mild RAE, mild to mod TR, PASP 41  . DIVERTICULITIS OF COLON 01/27/2009  . ESSENTIAL HYPERTENSION 09/30/2009  . Hemorrhoids   . Hypertension    . Left ventricular dysfunction   . OSTEOARTHRITIS, KNEE, RIGHT 02/08/2009  . OSTEOPENIA 01/27/2009  . Rectal bleeding   . SMALL BOWEL OBSTRUCTION 05/05/2008    Past Surgical History:  Procedure Laterality Date  . ABDOMINAL HYSTERECTOMY  1996   TAHBSO  . APPENDECTOMY  1951  . CARDIAC CATHETERIZATION  April 14, 2012   nonobstructive CAD; severe LV dysfunction.   . COLON SURGERY  2006   Sigmoid colectomy for diverticulitis  . TONSILLECTOMY  1966    Family History  Problem Relation Age of Onset  . Heart disease Father   . Heart disease Sister   . Heart disease Other   . Emphysema Other     Social History   Socioeconomic History  . Marital status: Widowed    Spouse name: Not on file  . Number of children: Not on file  . Years of education: Not on file  . Highest education level: Not on file  Occupational History  . Not on file  Tobacco Use  . Smoking status: Never Smoker  . Smokeless tobacco: Never Used  Vaping Use  . Vaping Use: Never used  Substance and Sexual Activity  . Alcohol use: No  . Drug use: No  . Sexual activity: Not Currently  Other Topics Concern  . Not on file  Social History Narrative  . Not on file   Social Determinants of Health  Financial Resource Strain: Low Risk   . Difficulty of Paying Living Expenses: Not hard at all  Food Insecurity: No Food Insecurity  . Worried About Programme researcher, broadcasting/film/videounning Out of Food in the Last Year: Never true  . Ran Out of Food in the Last Year: Never true  Transportation Needs: No Transportation Needs  . Lack of Transportation (Medical): No  . Lack of Transportation (Non-Medical): No  Physical Activity: Inactive  . Days of Exercise per Week: 0 days  . Minutes of Exercise per Session: 0 min  Stress: No Stress Concern Present  . Feeling of Stress : Not at all  Social Connections: Socially Isolated  . Frequency of Communication with Friends and Family: More than three times a week  . Frequency of Social Gatherings with  Friends and Family: More than three times a week  . Attends Religious Services: Never  . Active Member of Clubs or Organizations: No  . Attends BankerClub or Organization Meetings: Never  . Marital Status: Divorced  Catering managerntimate Partner Violence: Not At Risk  . Fear of Current or Ex-Partner: No  . Emotionally Abused: No  . Physically Abused: No  . Sexually Abused: No    Outpatient Medications Prior to Visit  Medication Sig Dispense Refill  . azelastine (OPTIVAR) 0.05 % ophthalmic solution INSTILL 1 TO 2 DROPS INTO EACH EYE ONCE DAILY AS NEEDED FOR ALLERGIES    . cromolyn (OPTICROM) 4 % ophthalmic solution INSTILL 1 DROP INTO EACH EYE TWICE DAILY AS DIRECTED AS NEEDED FOR ALLERGIES    . Ferrous Gluconate (IRON 27 PO) Take 1 tablet by mouth daily.    . furosemide (LASIX) 40 MG tablet TAKE 1 TABLET BY MOUTH ONCE DAILY IN THE MORNING AND 1/2 (ONE-HALF) TABLET ONCE DAILY IN THE EVENING (Patient taking differently: 40 mg 2 (two) times daily. TAKE 1 TABLET BY MOUTH BY MOUTH TWICE DAILY) 135 tablet 3  . hydrocortisone cream 0.5 % Apply 1 application topically 2 (two) times daily.    Marland Kitchen. loratadine (CLARITIN) 10 MG tablet Take 10 mg by mouth daily.    . metoprolol tartrate (LOPRESSOR) 100 MG tablet TAKE 1 TABLET BY MOUTH TWICE DAILY 180 tablet 3  . Multiple Vitamin (MULTIVITAMIN) tablet Take 1 tablet by mouth daily.    . potassium chloride (KLOR-CON) 10 MEQ tablet TAKE 3 TABLETS BY MOUTH  DAILY 270 tablet 3  . sodium fluoride (FLUORISHIELD) 1.1 % GEL dental gel     . warfarin (COUMADIN) 5 MG tablet Take 1/2 tablet daily except 1 tablet on Tues Thurs and Sat or Take as directed by anticoagulation clinic 75 tablet 1  . atorvastatin (LIPITOR) 20 MG tablet Take 1 tablet (20 mg total) by mouth daily. (Patient not taking: Reported on 03/18/2020) 90 tablet 1  . fluticasone (FLONASE) 50 MCG/ACT nasal spray Place 1 spray into both nostrils daily. 16 g 0  . traMADol (ULTRAM) 50 MG tablet Take 50 mg by mouth 2 (two) times  daily as needed.     No facility-administered medications prior to visit.    Allergies  Allergen Reactions  . Gabapentin Nausea And Vomiting and Other (See Comments)    Did not feel like her self so she stopped.   . Bactrim Nausea Only and Other (See Comments)    headache  . Codeine Nausea Only  . Contrast Media [Iodinated Diagnostic Agents] Other (See Comments)    Pt unsure of reaction  . Nitrofurantoin Other (See Comments)    Pt unsure of reaction  . Nitrofurantoin  Monohyd Macro Other (See Comments)    Pt unsure of reaction  . Penicillins Nausea And Vomiting  . Sulfamethoxazole-Trimethoprim Other (See Comments)    Pt unsure of reaction    ROS Review of Systems  Constitutional: Negative for appetite change and unexpected weight change.  Respiratory: Negative for cough.   Cardiovascular: Positive for leg swelling. Negative for chest pain and palpitations.  Genitourinary: Negative for dysuria.  Skin: Positive for rash.  Neurological: Positive for numbness.  Psychiatric/Behavioral: Negative for confusion.      Objective:    Physical Exam Vitals reviewed.  Constitutional:      Appearance: Normal appearance.  Cardiovascular:     Rate and Rhythm: Normal rate.     Comments: Irregular rhythm Musculoskeletal:     Right lower leg: Edema present.     Left lower leg: Edema present.     Comments: She has 1+ pitting edema lower legs bilaterally  Neurological:     General: No focal deficit present.     Mental Status: She is alert.     BP 116/76   Pulse 82   Temp 98.2 F (36.8 C)   Ht 5' 5.5" (1.664 m)   Wt 158 lb 9.6 oz (71.9 kg)   BMI 25.99 kg/m  Wt Readings from Last 3 Encounters:  03/18/20 158 lb 9.6 oz (71.9 kg)  02/05/20 153 lb (69.4 kg)  12/15/19 153 lb (69.4 kg)     Health Maintenance Due  Topic Date Due  . TETANUS/TDAP  01/08/2013    There are no preventive care reminders to display for this patient.  Lab Results  Component Value Date   TSH 3.93  02/01/2016   Lab Results  Component Value Date   WBC 6.1 08/29/2018   HGB 13.6 08/29/2018   HCT 40.3 08/29/2018   MCV 99.3 08/29/2018   PLT 123.0 Repeated and verified X2. (L) 08/29/2018   Lab Results  Component Value Date   NA 140 06/29/2019   K 4.1 06/29/2019   CO2 30 06/29/2019   GLUCOSE 95 06/29/2019   BUN 21 06/29/2019   CREATININE 0.96 06/29/2019   BILITOT 1.6 (H) 06/29/2019   ALKPHOS 69 06/29/2019   AST 26 06/29/2019   ALT 19 06/29/2019   PROT 6.2 06/29/2019   ALBUMIN 4.0 06/29/2019   CALCIUM 9.1 06/29/2019   GFR 54.51 (L) 06/29/2019   Lab Results  Component Value Date   CHOL 131 06/29/2019   Lab Results  Component Value Date   HDL 56.20 06/29/2019   Lab Results  Component Value Date   LDLCALC 53 06/29/2019   Lab Results  Component Value Date   TRIG 107.0 06/29/2019   Lab Results  Component Value Date   CHOLHDL 2 06/29/2019   Lab Results  Component Value Date   HGBA1C 5.8 08/06/2016      Assessment & Plan:   Problem List Items Addressed This Visit      Unprioritized   Atrial fibrillation (HCC)   Chronic systolic congestive heart failure (HCC)   Relevant Orders   Basic metabolic panel   Essential hypertension - Primary   Relevant Orders   Basic metabolic panel   Vitamin B 12 deficiency   Relevant Orders   Vitamin B12    Other Visit Diagnoses    Candida infection       Relevant Medications   nystatin cream (MYCOSTATIN)    Patient's weight is up 5 pounds from last visit by our scales.  She does have some  increased leg edema today.  We will increase her Lasix to 40 mg twice daily -Recheck basic metabolic panel -Elevate legs frequently and continue compression garments as tolerated  -For skin rash start nystatin cream twice daily and avoid bra use is much as possible and keep area dry.  Be in touch if not clearing in a couple weeks  -Continue daily weights at home and be in touch for weight gain of 3 pounds in 1 day or 5 pounds in 1  week.  Meds ordered this encounter  Medications  . nystatin cream (MYCOSTATIN)    Sig: Apply 1 application topically 2 (two) times daily.    Dispense:  30 g    Refill:  1    Follow-up: Return in about 6 months (around 09/18/2020).    Evelena Peat, MD

## 2020-03-18 NOTE — Patient Instructions (Signed)
INCREASE THE FUROSEMIDE TO ONE FULL TABLET TWICE DAILY.

## 2020-03-22 DIAGNOSIS — G629 Polyneuropathy, unspecified: Secondary | ICD-10-CM | POA: Diagnosis not present

## 2020-03-22 DIAGNOSIS — H8110 Benign paroxysmal vertigo, unspecified ear: Secondary | ICD-10-CM | POA: Diagnosis not present

## 2020-03-22 DIAGNOSIS — I4892 Unspecified atrial flutter: Secondary | ICD-10-CM | POA: Diagnosis not present

## 2020-03-22 DIAGNOSIS — I5022 Chronic systolic (congestive) heart failure: Secondary | ICD-10-CM | POA: Diagnosis not present

## 2020-03-22 DIAGNOSIS — S62101D Fracture of unspecified carpal bone, right wrist, subsequent encounter for fracture with routine healing: Secondary | ICD-10-CM | POA: Diagnosis not present

## 2020-03-22 DIAGNOSIS — I130 Hypertensive heart and chronic kidney disease with heart failure and stage 1 through stage 4 chronic kidney disease, or unspecified chronic kidney disease: Secondary | ICD-10-CM | POA: Diagnosis not present

## 2020-03-22 DIAGNOSIS — N189 Chronic kidney disease, unspecified: Secondary | ICD-10-CM | POA: Diagnosis not present

## 2020-03-22 DIAGNOSIS — G5793 Unspecified mononeuropathy of bilateral lower limbs: Secondary | ICD-10-CM | POA: Diagnosis not present

## 2020-03-22 DIAGNOSIS — I4891 Unspecified atrial fibrillation: Secondary | ICD-10-CM | POA: Diagnosis not present

## 2020-03-22 DIAGNOSIS — R296 Repeated falls: Secondary | ICD-10-CM | POA: Diagnosis not present

## 2020-03-22 DIAGNOSIS — M1711 Unilateral primary osteoarthritis, right knee: Secondary | ICD-10-CM | POA: Diagnosis not present

## 2020-03-29 DIAGNOSIS — I5022 Chronic systolic (congestive) heart failure: Secondary | ICD-10-CM | POA: Diagnosis not present

## 2020-03-29 DIAGNOSIS — H8110 Benign paroxysmal vertigo, unspecified ear: Secondary | ICD-10-CM | POA: Diagnosis not present

## 2020-03-29 DIAGNOSIS — S62101D Fracture of unspecified carpal bone, right wrist, subsequent encounter for fracture with routine healing: Secondary | ICD-10-CM | POA: Diagnosis not present

## 2020-03-29 DIAGNOSIS — M1711 Unilateral primary osteoarthritis, right knee: Secondary | ICD-10-CM | POA: Diagnosis not present

## 2020-03-29 DIAGNOSIS — R296 Repeated falls: Secondary | ICD-10-CM | POA: Diagnosis not present

## 2020-03-29 DIAGNOSIS — I4891 Unspecified atrial fibrillation: Secondary | ICD-10-CM | POA: Diagnosis not present

## 2020-03-29 DIAGNOSIS — G629 Polyneuropathy, unspecified: Secondary | ICD-10-CM | POA: Diagnosis not present

## 2020-03-29 DIAGNOSIS — I130 Hypertensive heart and chronic kidney disease with heart failure and stage 1 through stage 4 chronic kidney disease, or unspecified chronic kidney disease: Secondary | ICD-10-CM | POA: Diagnosis not present

## 2020-03-29 DIAGNOSIS — N189 Chronic kidney disease, unspecified: Secondary | ICD-10-CM | POA: Diagnosis not present

## 2020-03-29 DIAGNOSIS — G5793 Unspecified mononeuropathy of bilateral lower limbs: Secondary | ICD-10-CM | POA: Diagnosis not present

## 2020-03-29 DIAGNOSIS — I4892 Unspecified atrial flutter: Secondary | ICD-10-CM | POA: Diagnosis not present

## 2020-03-31 DIAGNOSIS — S62101D Fracture of unspecified carpal bone, right wrist, subsequent encounter for fracture with routine healing: Secondary | ICD-10-CM | POA: Diagnosis not present

## 2020-03-31 DIAGNOSIS — R296 Repeated falls: Secondary | ICD-10-CM | POA: Diagnosis not present

## 2020-03-31 DIAGNOSIS — I4891 Unspecified atrial fibrillation: Secondary | ICD-10-CM | POA: Diagnosis not present

## 2020-03-31 DIAGNOSIS — M1711 Unilateral primary osteoarthritis, right knee: Secondary | ICD-10-CM | POA: Diagnosis not present

## 2020-03-31 DIAGNOSIS — H8110 Benign paroxysmal vertigo, unspecified ear: Secondary | ICD-10-CM | POA: Diagnosis not present

## 2020-03-31 DIAGNOSIS — I130 Hypertensive heart and chronic kidney disease with heart failure and stage 1 through stage 4 chronic kidney disease, or unspecified chronic kidney disease: Secondary | ICD-10-CM | POA: Diagnosis not present

## 2020-03-31 DIAGNOSIS — G629 Polyneuropathy, unspecified: Secondary | ICD-10-CM | POA: Diagnosis not present

## 2020-03-31 DIAGNOSIS — I4892 Unspecified atrial flutter: Secondary | ICD-10-CM | POA: Diagnosis not present

## 2020-03-31 DIAGNOSIS — G5793 Unspecified mononeuropathy of bilateral lower limbs: Secondary | ICD-10-CM | POA: Diagnosis not present

## 2020-03-31 DIAGNOSIS — N189 Chronic kidney disease, unspecified: Secondary | ICD-10-CM | POA: Diagnosis not present

## 2020-03-31 DIAGNOSIS — I5022 Chronic systolic (congestive) heart failure: Secondary | ICD-10-CM | POA: Diagnosis not present

## 2020-04-05 DIAGNOSIS — S62101D Fracture of unspecified carpal bone, right wrist, subsequent encounter for fracture with routine healing: Secondary | ICD-10-CM | POA: Diagnosis not present

## 2020-04-05 DIAGNOSIS — I5022 Chronic systolic (congestive) heart failure: Secondary | ICD-10-CM | POA: Diagnosis not present

## 2020-04-05 DIAGNOSIS — G629 Polyneuropathy, unspecified: Secondary | ICD-10-CM | POA: Diagnosis not present

## 2020-04-05 DIAGNOSIS — I130 Hypertensive heart and chronic kidney disease with heart failure and stage 1 through stage 4 chronic kidney disease, or unspecified chronic kidney disease: Secondary | ICD-10-CM | POA: Diagnosis not present

## 2020-04-05 DIAGNOSIS — N189 Chronic kidney disease, unspecified: Secondary | ICD-10-CM | POA: Diagnosis not present

## 2020-04-05 DIAGNOSIS — R296 Repeated falls: Secondary | ICD-10-CM | POA: Diagnosis not present

## 2020-04-05 DIAGNOSIS — H8110 Benign paroxysmal vertigo, unspecified ear: Secondary | ICD-10-CM | POA: Diagnosis not present

## 2020-04-05 DIAGNOSIS — I4892 Unspecified atrial flutter: Secondary | ICD-10-CM | POA: Diagnosis not present

## 2020-04-05 DIAGNOSIS — M1711 Unilateral primary osteoarthritis, right knee: Secondary | ICD-10-CM | POA: Diagnosis not present

## 2020-04-05 DIAGNOSIS — G5793 Unspecified mononeuropathy of bilateral lower limbs: Secondary | ICD-10-CM | POA: Diagnosis not present

## 2020-04-05 DIAGNOSIS — I4891 Unspecified atrial fibrillation: Secondary | ICD-10-CM | POA: Diagnosis not present

## 2020-04-14 ENCOUNTER — Other Ambulatory Visit: Payer: Self-pay | Admitting: Family Medicine

## 2020-04-14 DIAGNOSIS — Z7901 Long term (current) use of anticoagulants: Secondary | ICD-10-CM

## 2020-04-15 ENCOUNTER — Telehealth: Payer: Self-pay | Admitting: Family Medicine

## 2020-04-15 DIAGNOSIS — R296 Repeated falls: Secondary | ICD-10-CM | POA: Diagnosis not present

## 2020-04-15 DIAGNOSIS — I4891 Unspecified atrial fibrillation: Secondary | ICD-10-CM | POA: Diagnosis not present

## 2020-04-15 DIAGNOSIS — G629 Polyneuropathy, unspecified: Secondary | ICD-10-CM | POA: Diagnosis not present

## 2020-04-15 DIAGNOSIS — M1711 Unilateral primary osteoarthritis, right knee: Secondary | ICD-10-CM | POA: Diagnosis not present

## 2020-04-15 DIAGNOSIS — I5022 Chronic systolic (congestive) heart failure: Secondary | ICD-10-CM | POA: Diagnosis not present

## 2020-04-15 DIAGNOSIS — I4892 Unspecified atrial flutter: Secondary | ICD-10-CM | POA: Diagnosis not present

## 2020-04-15 DIAGNOSIS — H8110 Benign paroxysmal vertigo, unspecified ear: Secondary | ICD-10-CM | POA: Diagnosis not present

## 2020-04-15 DIAGNOSIS — I130 Hypertensive heart and chronic kidney disease with heart failure and stage 1 through stage 4 chronic kidney disease, or unspecified chronic kidney disease: Secondary | ICD-10-CM | POA: Diagnosis not present

## 2020-04-15 DIAGNOSIS — G5793 Unspecified mononeuropathy of bilateral lower limbs: Secondary | ICD-10-CM | POA: Diagnosis not present

## 2020-04-15 DIAGNOSIS — N189 Chronic kidney disease, unspecified: Secondary | ICD-10-CM | POA: Diagnosis not present

## 2020-04-15 DIAGNOSIS — S62101D Fracture of unspecified carpal bone, right wrist, subsequent encounter for fracture with routine healing: Secondary | ICD-10-CM | POA: Diagnosis not present

## 2020-04-15 NOTE — Telephone Encounter (Signed)
OK 

## 2020-04-15 NOTE — Telephone Encounter (Signed)
Left a detailed message on verified voice mail giving the ok for orders. Nothing further needed.  

## 2020-04-15 NOTE — Telephone Encounter (Signed)
Aurelio Jew a Physical Therapist from [facility] is calling and is requesting verbal orders for physical therapy 1 week 8, please advise. CB is [phone] to leave mail. CB is [phone]

## 2020-04-18 ENCOUNTER — Ambulatory Visit: Payer: Medicare Other

## 2020-04-18 DIAGNOSIS — I4892 Unspecified atrial flutter: Secondary | ICD-10-CM | POA: Diagnosis not present

## 2020-04-18 DIAGNOSIS — R296 Repeated falls: Secondary | ICD-10-CM | POA: Diagnosis not present

## 2020-04-18 DIAGNOSIS — G629 Polyneuropathy, unspecified: Secondary | ICD-10-CM | POA: Diagnosis not present

## 2020-04-18 DIAGNOSIS — I5022 Chronic systolic (congestive) heart failure: Secondary | ICD-10-CM | POA: Diagnosis not present

## 2020-04-18 DIAGNOSIS — G5793 Unspecified mononeuropathy of bilateral lower limbs: Secondary | ICD-10-CM | POA: Diagnosis not present

## 2020-04-18 DIAGNOSIS — M1711 Unilateral primary osteoarthritis, right knee: Secondary | ICD-10-CM | POA: Diagnosis not present

## 2020-04-18 DIAGNOSIS — S62101D Fracture of unspecified carpal bone, right wrist, subsequent encounter for fracture with routine healing: Secondary | ICD-10-CM | POA: Diagnosis not present

## 2020-04-18 DIAGNOSIS — I4891 Unspecified atrial fibrillation: Secondary | ICD-10-CM | POA: Diagnosis not present

## 2020-04-18 DIAGNOSIS — H8110 Benign paroxysmal vertigo, unspecified ear: Secondary | ICD-10-CM | POA: Diagnosis not present

## 2020-04-18 DIAGNOSIS — I130 Hypertensive heart and chronic kidney disease with heart failure and stage 1 through stage 4 chronic kidney disease, or unspecified chronic kidney disease: Secondary | ICD-10-CM | POA: Diagnosis not present

## 2020-04-18 DIAGNOSIS — N189 Chronic kidney disease, unspecified: Secondary | ICD-10-CM | POA: Diagnosis not present

## 2020-04-20 DIAGNOSIS — G5793 Unspecified mononeuropathy of bilateral lower limbs: Secondary | ICD-10-CM | POA: Diagnosis not present

## 2020-04-20 DIAGNOSIS — I4892 Unspecified atrial flutter: Secondary | ICD-10-CM | POA: Diagnosis not present

## 2020-04-20 DIAGNOSIS — M1711 Unilateral primary osteoarthritis, right knee: Secondary | ICD-10-CM | POA: Diagnosis not present

## 2020-04-20 DIAGNOSIS — H8110 Benign paroxysmal vertigo, unspecified ear: Secondary | ICD-10-CM | POA: Diagnosis not present

## 2020-04-20 DIAGNOSIS — I4891 Unspecified atrial fibrillation: Secondary | ICD-10-CM | POA: Diagnosis not present

## 2020-04-20 DIAGNOSIS — R296 Repeated falls: Secondary | ICD-10-CM | POA: Diagnosis not present

## 2020-04-20 DIAGNOSIS — I5022 Chronic systolic (congestive) heart failure: Secondary | ICD-10-CM | POA: Diagnosis not present

## 2020-04-20 DIAGNOSIS — I130 Hypertensive heart and chronic kidney disease with heart failure and stage 1 through stage 4 chronic kidney disease, or unspecified chronic kidney disease: Secondary | ICD-10-CM | POA: Diagnosis not present

## 2020-04-20 DIAGNOSIS — N189 Chronic kidney disease, unspecified: Secondary | ICD-10-CM | POA: Diagnosis not present

## 2020-04-20 DIAGNOSIS — G629 Polyneuropathy, unspecified: Secondary | ICD-10-CM | POA: Diagnosis not present

## 2020-04-20 DIAGNOSIS — S62101D Fracture of unspecified carpal bone, right wrist, subsequent encounter for fracture with routine healing: Secondary | ICD-10-CM | POA: Diagnosis not present

## 2020-04-25 ENCOUNTER — Ambulatory Visit (INDEPENDENT_AMBULATORY_CARE_PROVIDER_SITE_OTHER): Payer: Medicare Other | Admitting: General Practice

## 2020-04-25 ENCOUNTER — Other Ambulatory Visit: Payer: Self-pay

## 2020-04-25 DIAGNOSIS — I4891 Unspecified atrial fibrillation: Secondary | ICD-10-CM | POA: Diagnosis not present

## 2020-04-25 DIAGNOSIS — Z7901 Long term (current) use of anticoagulants: Secondary | ICD-10-CM

## 2020-04-25 LAB — POCT INR: INR: 2.1 (ref 2.0–3.0)

## 2020-04-25 NOTE — Patient Instructions (Signed)
Pre visit review using our clinic review tool, if applicable. No additional management support is needed unless otherwise documented below in the visit note.  Continue to take 1/2 tablet daily except 1 tablet on Tues, Thurs and Saturday.  Re-check in 4 weeks.

## 2020-04-26 DIAGNOSIS — N189 Chronic kidney disease, unspecified: Secondary | ICD-10-CM | POA: Diagnosis not present

## 2020-04-26 DIAGNOSIS — I4892 Unspecified atrial flutter: Secondary | ICD-10-CM | POA: Diagnosis not present

## 2020-04-26 DIAGNOSIS — S62101D Fracture of unspecified carpal bone, right wrist, subsequent encounter for fracture with routine healing: Secondary | ICD-10-CM | POA: Diagnosis not present

## 2020-04-26 DIAGNOSIS — I5022 Chronic systolic (congestive) heart failure: Secondary | ICD-10-CM | POA: Diagnosis not present

## 2020-04-26 DIAGNOSIS — G629 Polyneuropathy, unspecified: Secondary | ICD-10-CM | POA: Diagnosis not present

## 2020-04-26 DIAGNOSIS — G5793 Unspecified mononeuropathy of bilateral lower limbs: Secondary | ICD-10-CM | POA: Diagnosis not present

## 2020-04-26 DIAGNOSIS — I130 Hypertensive heart and chronic kidney disease with heart failure and stage 1 through stage 4 chronic kidney disease, or unspecified chronic kidney disease: Secondary | ICD-10-CM | POA: Diagnosis not present

## 2020-04-26 DIAGNOSIS — I4891 Unspecified atrial fibrillation: Secondary | ICD-10-CM | POA: Diagnosis not present

## 2020-04-26 DIAGNOSIS — H8110 Benign paroxysmal vertigo, unspecified ear: Secondary | ICD-10-CM | POA: Diagnosis not present

## 2020-04-26 DIAGNOSIS — M1711 Unilateral primary osteoarthritis, right knee: Secondary | ICD-10-CM | POA: Diagnosis not present

## 2020-04-26 DIAGNOSIS — R296 Repeated falls: Secondary | ICD-10-CM | POA: Diagnosis not present

## 2020-05-02 DIAGNOSIS — N189 Chronic kidney disease, unspecified: Secondary | ICD-10-CM | POA: Diagnosis not present

## 2020-05-02 DIAGNOSIS — I130 Hypertensive heart and chronic kidney disease with heart failure and stage 1 through stage 4 chronic kidney disease, or unspecified chronic kidney disease: Secondary | ICD-10-CM | POA: Diagnosis not present

## 2020-05-02 DIAGNOSIS — I5022 Chronic systolic (congestive) heart failure: Secondary | ICD-10-CM | POA: Diagnosis not present

## 2020-05-02 DIAGNOSIS — S62101D Fracture of unspecified carpal bone, right wrist, subsequent encounter for fracture with routine healing: Secondary | ICD-10-CM | POA: Diagnosis not present

## 2020-05-02 DIAGNOSIS — G629 Polyneuropathy, unspecified: Secondary | ICD-10-CM | POA: Diagnosis not present

## 2020-05-02 DIAGNOSIS — M1711 Unilateral primary osteoarthritis, right knee: Secondary | ICD-10-CM | POA: Diagnosis not present

## 2020-05-02 DIAGNOSIS — H8110 Benign paroxysmal vertigo, unspecified ear: Secondary | ICD-10-CM | POA: Diagnosis not present

## 2020-05-02 DIAGNOSIS — I4892 Unspecified atrial flutter: Secondary | ICD-10-CM | POA: Diagnosis not present

## 2020-05-02 DIAGNOSIS — G5793 Unspecified mononeuropathy of bilateral lower limbs: Secondary | ICD-10-CM | POA: Diagnosis not present

## 2020-05-02 DIAGNOSIS — I4891 Unspecified atrial fibrillation: Secondary | ICD-10-CM | POA: Diagnosis not present

## 2020-05-02 DIAGNOSIS — R296 Repeated falls: Secondary | ICD-10-CM | POA: Diagnosis not present

## 2020-05-03 DIAGNOSIS — I5022 Chronic systolic (congestive) heart failure: Secondary | ICD-10-CM | POA: Diagnosis not present

## 2020-05-03 DIAGNOSIS — I4891 Unspecified atrial fibrillation: Secondary | ICD-10-CM | POA: Diagnosis not present

## 2020-05-03 DIAGNOSIS — G5793 Unspecified mononeuropathy of bilateral lower limbs: Secondary | ICD-10-CM | POA: Diagnosis not present

## 2020-05-03 DIAGNOSIS — R296 Repeated falls: Secondary | ICD-10-CM | POA: Diagnosis not present

## 2020-05-03 DIAGNOSIS — G629 Polyneuropathy, unspecified: Secondary | ICD-10-CM | POA: Diagnosis not present

## 2020-05-03 DIAGNOSIS — S62101D Fracture of unspecified carpal bone, right wrist, subsequent encounter for fracture with routine healing: Secondary | ICD-10-CM | POA: Diagnosis not present

## 2020-05-03 DIAGNOSIS — N189 Chronic kidney disease, unspecified: Secondary | ICD-10-CM | POA: Diagnosis not present

## 2020-05-03 DIAGNOSIS — M1711 Unilateral primary osteoarthritis, right knee: Secondary | ICD-10-CM | POA: Diagnosis not present

## 2020-05-03 DIAGNOSIS — I130 Hypertensive heart and chronic kidney disease with heart failure and stage 1 through stage 4 chronic kidney disease, or unspecified chronic kidney disease: Secondary | ICD-10-CM | POA: Diagnosis not present

## 2020-05-03 DIAGNOSIS — I4892 Unspecified atrial flutter: Secondary | ICD-10-CM | POA: Diagnosis not present

## 2020-05-03 DIAGNOSIS — H8110 Benign paroxysmal vertigo, unspecified ear: Secondary | ICD-10-CM | POA: Diagnosis not present

## 2020-05-10 DIAGNOSIS — R296 Repeated falls: Secondary | ICD-10-CM | POA: Diagnosis not present

## 2020-05-10 DIAGNOSIS — I130 Hypertensive heart and chronic kidney disease with heart failure and stage 1 through stage 4 chronic kidney disease, or unspecified chronic kidney disease: Secondary | ICD-10-CM | POA: Diagnosis not present

## 2020-05-10 DIAGNOSIS — H8110 Benign paroxysmal vertigo, unspecified ear: Secondary | ICD-10-CM | POA: Diagnosis not present

## 2020-05-10 DIAGNOSIS — N189 Chronic kidney disease, unspecified: Secondary | ICD-10-CM | POA: Diagnosis not present

## 2020-05-10 DIAGNOSIS — I4892 Unspecified atrial flutter: Secondary | ICD-10-CM | POA: Diagnosis not present

## 2020-05-10 DIAGNOSIS — I5022 Chronic systolic (congestive) heart failure: Secondary | ICD-10-CM | POA: Diagnosis not present

## 2020-05-10 DIAGNOSIS — S62101D Fracture of unspecified carpal bone, right wrist, subsequent encounter for fracture with routine healing: Secondary | ICD-10-CM | POA: Diagnosis not present

## 2020-05-10 DIAGNOSIS — G5793 Unspecified mononeuropathy of bilateral lower limbs: Secondary | ICD-10-CM | POA: Diagnosis not present

## 2020-05-10 DIAGNOSIS — G629 Polyneuropathy, unspecified: Secondary | ICD-10-CM | POA: Diagnosis not present

## 2020-05-10 DIAGNOSIS — I4891 Unspecified atrial fibrillation: Secondary | ICD-10-CM | POA: Diagnosis not present

## 2020-05-10 DIAGNOSIS — M1711 Unilateral primary osteoarthritis, right knee: Secondary | ICD-10-CM | POA: Diagnosis not present

## 2020-05-16 DIAGNOSIS — M1711 Unilateral primary osteoarthritis, right knee: Secondary | ICD-10-CM | POA: Diagnosis not present

## 2020-05-16 DIAGNOSIS — I130 Hypertensive heart and chronic kidney disease with heart failure and stage 1 through stage 4 chronic kidney disease, or unspecified chronic kidney disease: Secondary | ICD-10-CM | POA: Diagnosis not present

## 2020-05-16 DIAGNOSIS — H8110 Benign paroxysmal vertigo, unspecified ear: Secondary | ICD-10-CM | POA: Diagnosis not present

## 2020-05-16 DIAGNOSIS — S62101D Fracture of unspecified carpal bone, right wrist, subsequent encounter for fracture with routine healing: Secondary | ICD-10-CM | POA: Diagnosis not present

## 2020-05-16 DIAGNOSIS — G629 Polyneuropathy, unspecified: Secondary | ICD-10-CM | POA: Diagnosis not present

## 2020-05-16 DIAGNOSIS — N189 Chronic kidney disease, unspecified: Secondary | ICD-10-CM | POA: Diagnosis not present

## 2020-05-16 DIAGNOSIS — I4892 Unspecified atrial flutter: Secondary | ICD-10-CM | POA: Diagnosis not present

## 2020-05-16 DIAGNOSIS — I4891 Unspecified atrial fibrillation: Secondary | ICD-10-CM | POA: Diagnosis not present

## 2020-05-16 DIAGNOSIS — I5022 Chronic systolic (congestive) heart failure: Secondary | ICD-10-CM | POA: Diagnosis not present

## 2020-05-16 DIAGNOSIS — G5793 Unspecified mononeuropathy of bilateral lower limbs: Secondary | ICD-10-CM | POA: Diagnosis not present

## 2020-05-16 DIAGNOSIS — R296 Repeated falls: Secondary | ICD-10-CM | POA: Diagnosis not present

## 2020-05-17 DIAGNOSIS — I4892 Unspecified atrial flutter: Secondary | ICD-10-CM | POA: Diagnosis not present

## 2020-05-17 DIAGNOSIS — S62101D Fracture of unspecified carpal bone, right wrist, subsequent encounter for fracture with routine healing: Secondary | ICD-10-CM | POA: Diagnosis not present

## 2020-05-17 DIAGNOSIS — R296 Repeated falls: Secondary | ICD-10-CM | POA: Diagnosis not present

## 2020-05-17 DIAGNOSIS — G5793 Unspecified mononeuropathy of bilateral lower limbs: Secondary | ICD-10-CM | POA: Diagnosis not present

## 2020-05-17 DIAGNOSIS — H8110 Benign paroxysmal vertigo, unspecified ear: Secondary | ICD-10-CM | POA: Diagnosis not present

## 2020-05-17 DIAGNOSIS — N189 Chronic kidney disease, unspecified: Secondary | ICD-10-CM | POA: Diagnosis not present

## 2020-05-17 DIAGNOSIS — M1711 Unilateral primary osteoarthritis, right knee: Secondary | ICD-10-CM | POA: Diagnosis not present

## 2020-05-17 DIAGNOSIS — I4891 Unspecified atrial fibrillation: Secondary | ICD-10-CM | POA: Diagnosis not present

## 2020-05-17 DIAGNOSIS — G629 Polyneuropathy, unspecified: Secondary | ICD-10-CM | POA: Diagnosis not present

## 2020-05-17 DIAGNOSIS — I5022 Chronic systolic (congestive) heart failure: Secondary | ICD-10-CM | POA: Diagnosis not present

## 2020-05-17 DIAGNOSIS — I130 Hypertensive heart and chronic kidney disease with heart failure and stage 1 through stage 4 chronic kidney disease, or unspecified chronic kidney disease: Secondary | ICD-10-CM | POA: Diagnosis not present

## 2020-05-20 ENCOUNTER — Telehealth: Payer: Self-pay | Admitting: Pharmacist

## 2020-05-20 NOTE — Chronic Care Management (AMB) (Signed)
    Chronic Care Management Pharmacy Assistant   Name: Earl Zellmer  MRN: [id number] DOB: [birth year]  Reason for Encounter: General Adherence Call  Medications: Outpatient Encounter Medications as of 05/20/2020  Medication Sig  . azelastine (OPTIVAR) 0.05 % ophthalmic solution INSTILL 1 TO 2 DROPS INTO EACH EYE ONCE DAILY AS NEEDED FOR ALLERGIES  . cromolyn (OPTICROM) 4 % ophthalmic solution INSTILL 1 DROP INTO EACH EYE TWICE DAILY AS DIRECTED AS NEEDED FOR ALLERGIES  . Ferrous Gluconate (IRON 27 PO) Take 1 tablet by mouth daily.  . furosemide (LASIX) 40 MG tablet TAKE 1 TABLET BY MOUTH ONCE DAILY IN THE MORNING AND 1/2 (ONE-HALF) TABLET ONCE DAILY IN THE EVENING (Patient taking differently: 40 mg 2 (two) times daily. TAKE 1 TABLET BY MOUTH BY MOUTH TWICE DAILY)  . hydrocortisone cream 0.5 % Apply 1 application topically 2 (two) times daily.  Marland Kitchen loratadine (CLARITIN) 10 MG tablet Take 10 mg by mouth daily.  . metoprolol tartrate (LOPRESSOR) 100 MG tablet TAKE 1 TABLET BY MOUTH TWICE DAILY  . Multiple Vitamin (MULTIVITAMIN) tablet Take 1 tablet by mouth daily.  Marland Kitchen nystatin cream (MYCOSTATIN) Apply 1 application topically 2 (two) times daily.  . potassium chloride (KLOR-CON) 10 MEQ tablet TAKE 3 TABLETS BY MOUTH  DAILY  . sodium fluoride (FLUORISHIELD) 1.1 % GEL dental gel   . warfarin (COUMADIN) 5 MG tablet TAKE 1 TABLET BY MOUTH DAILY EXCEPT TAKE 1/2 TABLET ON MONDAY, WEDNESDAY, AND FRIDAY OR TAKE AS DIRECTED BY ANTICOAGULATION CLINIC   No facility-administered encounter medications on file as of 05/20/2020.   I spoke with the patient about medication adherence. She stated that she has been doing OK recently. She states that her metoprolol tartrate 100 mg makes her sleepy. She has brought this issue up with her PCP.  She did not have any BP readings. She states that she used to be very active, but she has had some falls in the past. No recent falls or injuries. She states that she has  physical therapy every other week once a week. No other side effects from her current medications. There have been no recent changes to her medications. No emergency department or urgent care visits since her last CPP or PCP visit. She continues to take her medications as prescribed. There are no current issues with her pharmacy. New appointment with CPP made for 06/23/2020 at 1:00 pm  Star Rating Drugs: None  Amilia (Mimi) [place], [facility] Concierge [phone]

## 2020-05-23 ENCOUNTER — Ambulatory Visit (INDEPENDENT_AMBULATORY_CARE_PROVIDER_SITE_OTHER): Payer: Medicare Other | Admitting: General Practice

## 2020-05-23 ENCOUNTER — Other Ambulatory Visit: Payer: Self-pay

## 2020-05-23 ENCOUNTER — Ambulatory Visit: Payer: Medicare Other | Admitting: Podiatry

## 2020-05-23 ENCOUNTER — Encounter: Payer: Self-pay | Admitting: Podiatry

## 2020-05-23 ENCOUNTER — Other Ambulatory Visit: Payer: Self-pay | Admitting: Family Medicine

## 2020-05-23 DIAGNOSIS — I130 Hypertensive heart and chronic kidney disease with heart failure and stage 1 through stage 4 chronic kidney disease, or unspecified chronic kidney disease: Secondary | ICD-10-CM | POA: Diagnosis not present

## 2020-05-23 DIAGNOSIS — M79675 Pain in left toe(s): Secondary | ICD-10-CM

## 2020-05-23 DIAGNOSIS — I4891 Unspecified atrial fibrillation: Secondary | ICD-10-CM | POA: Diagnosis not present

## 2020-05-23 DIAGNOSIS — N189 Chronic kidney disease, unspecified: Secondary | ICD-10-CM | POA: Diagnosis not present

## 2020-05-23 DIAGNOSIS — Z7901 Long term (current) use of anticoagulants: Secondary | ICD-10-CM

## 2020-05-23 DIAGNOSIS — L608 Other nail disorders: Secondary | ICD-10-CM

## 2020-05-23 DIAGNOSIS — S62101D Fracture of unspecified carpal bone, right wrist, subsequent encounter for fracture with routine healing: Secondary | ICD-10-CM | POA: Diagnosis not present

## 2020-05-23 DIAGNOSIS — G629 Polyneuropathy, unspecified: Secondary | ICD-10-CM | POA: Diagnosis not present

## 2020-05-23 DIAGNOSIS — R296 Repeated falls: Secondary | ICD-10-CM | POA: Diagnosis not present

## 2020-05-23 DIAGNOSIS — B351 Tinea unguium: Secondary | ICD-10-CM | POA: Diagnosis not present

## 2020-05-23 DIAGNOSIS — I5022 Chronic systolic (congestive) heart failure: Secondary | ICD-10-CM | POA: Diagnosis not present

## 2020-05-23 DIAGNOSIS — M1711 Unilateral primary osteoarthritis, right knee: Secondary | ICD-10-CM | POA: Diagnosis not present

## 2020-05-23 DIAGNOSIS — M79674 Pain in right toe(s): Secondary | ICD-10-CM

## 2020-05-23 DIAGNOSIS — H8110 Benign paroxysmal vertigo, unspecified ear: Secondary | ICD-10-CM | POA: Diagnosis not present

## 2020-05-23 DIAGNOSIS — I4892 Unspecified atrial flutter: Secondary | ICD-10-CM | POA: Diagnosis not present

## 2020-05-23 DIAGNOSIS — G5793 Unspecified mononeuropathy of bilateral lower limbs: Secondary | ICD-10-CM | POA: Diagnosis not present

## 2020-05-23 LAB — POCT INR: INR: 1.7 — AB (ref 2.0–3.0)

## 2020-05-23 NOTE — Progress Notes (Signed)
This patient returns to my office for at risk foot care.  This patient requires this care by a professional since this patient will be at risk due to having CKD and polyneuropathy..  She presents to the office with her son.  This patient is unable to cut nails herself since the patient cannot reach her nails.These nails are painful walking and wearing shoes.  This patient presents for at risk foot care today.  General Appearance  Alert, conversant and in no acute stress.  Vascular  Dorsalis pedis and posterior tibial  pulses are weakly  palpable  bilaterally.  Capillary return is within normal limits  bilaterally. Cold feet  Bilaterally.  Absent digital hair  B/L.  Neurologic  Senn-Weinstein monofilament wire test within normal limits/diminished   bilaterally. Muscle power within normal limits bilaterally.  Nails Thick disfigured discolored nails with subungual debris  from hallux to fifth toes bilaterally. No evidence of bacterial infection or drainage bilaterally.  Orthopedic  No limitations of motion  feet .  No crepitus or effusions noted.  No bony pathology or digital deformities noted.  Skin  normotropic skin with no porokeratosis noted bilaterally.  No signs of infections or ulcers noted.     Onychomycosis  Pain in right toes  Pain in left toes  Consent was obtained for treatment procedures.   Mechanical debridement of nails 1-5  bilaterally performed with a nail nipper.  Filed with dremel without incident.    Return office visit    3 months                  Told patient to return for periodic foot care and evaluation due to potential at risk complications.   Helane Gunther DPM

## 2020-05-23 NOTE — Patient Instructions (Signed)
Pre visit review using our clinic review tool, if applicable. No additional management support is needed unless otherwise documented below in the visit note.  Take 1 tablet today (5/16) and then continue to take 1/2 tablet daily except 1 tablet on Tues, Thurs and Saturday.  Re-check in 6 weeks.

## 2020-05-24 ENCOUNTER — Telehealth: Payer: Self-pay | Admitting: Pharmacist

## 2020-05-24 DIAGNOSIS — G5793 Unspecified mononeuropathy of bilateral lower limbs: Secondary | ICD-10-CM | POA: Diagnosis not present

## 2020-05-24 DIAGNOSIS — I4892 Unspecified atrial flutter: Secondary | ICD-10-CM | POA: Diagnosis not present

## 2020-05-24 DIAGNOSIS — I130 Hypertensive heart and chronic kidney disease with heart failure and stage 1 through stage 4 chronic kidney disease, or unspecified chronic kidney disease: Secondary | ICD-10-CM | POA: Diagnosis not present

## 2020-05-24 DIAGNOSIS — R296 Repeated falls: Secondary | ICD-10-CM | POA: Diagnosis not present

## 2020-05-24 DIAGNOSIS — H8110 Benign paroxysmal vertigo, unspecified ear: Secondary | ICD-10-CM | POA: Diagnosis not present

## 2020-05-24 DIAGNOSIS — N189 Chronic kidney disease, unspecified: Secondary | ICD-10-CM | POA: Diagnosis not present

## 2020-05-24 DIAGNOSIS — I4891 Unspecified atrial fibrillation: Secondary | ICD-10-CM | POA: Diagnosis not present

## 2020-05-24 DIAGNOSIS — M1711 Unilateral primary osteoarthritis, right knee: Secondary | ICD-10-CM | POA: Diagnosis not present

## 2020-05-24 DIAGNOSIS — S62101D Fracture of unspecified carpal bone, right wrist, subsequent encounter for fracture with routine healing: Secondary | ICD-10-CM | POA: Diagnosis not present

## 2020-05-24 DIAGNOSIS — I5022 Chronic systolic (congestive) heart failure: Secondary | ICD-10-CM | POA: Diagnosis not present

## 2020-05-24 DIAGNOSIS — G629 Polyneuropathy, unspecified: Secondary | ICD-10-CM | POA: Diagnosis not present

## 2020-05-24 NOTE — Chronic Care Management (AMB) (Signed)
    Chronic Care Management Pharmacy Assistant   Name: Karem Farha  MRN: [id number] DOB: 09-Apr-1926  Made in error.   [name] CMA  Health Concierge [phone]

## 2020-05-27 DIAGNOSIS — G629 Polyneuropathy, unspecified: Secondary | ICD-10-CM | POA: Diagnosis not present

## 2020-05-27 DIAGNOSIS — I5022 Chronic systolic (congestive) heart failure: Secondary | ICD-10-CM | POA: Diagnosis not present

## 2020-05-27 DIAGNOSIS — I4892 Unspecified atrial flutter: Secondary | ICD-10-CM | POA: Diagnosis not present

## 2020-05-27 DIAGNOSIS — R296 Repeated falls: Secondary | ICD-10-CM | POA: Diagnosis not present

## 2020-05-27 DIAGNOSIS — H8110 Benign paroxysmal vertigo, unspecified ear: Secondary | ICD-10-CM | POA: Diagnosis not present

## 2020-05-27 DIAGNOSIS — I130 Hypertensive heart and chronic kidney disease with heart failure and stage 1 through stage 4 chronic kidney disease, or unspecified chronic kidney disease: Secondary | ICD-10-CM | POA: Diagnosis not present

## 2020-05-27 DIAGNOSIS — N189 Chronic kidney disease, unspecified: Secondary | ICD-10-CM | POA: Diagnosis not present

## 2020-05-27 DIAGNOSIS — I4891 Unspecified atrial fibrillation: Secondary | ICD-10-CM | POA: Diagnosis not present

## 2020-05-27 DIAGNOSIS — M1711 Unilateral primary osteoarthritis, right knee: Secondary | ICD-10-CM | POA: Diagnosis not present

## 2020-05-27 DIAGNOSIS — S62101D Fracture of unspecified carpal bone, right wrist, subsequent encounter for fracture with routine healing: Secondary | ICD-10-CM | POA: Diagnosis not present

## 2020-05-27 DIAGNOSIS — G5793 Unspecified mononeuropathy of bilateral lower limbs: Secondary | ICD-10-CM | POA: Diagnosis not present

## 2020-05-30 DIAGNOSIS — N189 Chronic kidney disease, unspecified: Secondary | ICD-10-CM | POA: Diagnosis not present

## 2020-05-30 DIAGNOSIS — R296 Repeated falls: Secondary | ICD-10-CM | POA: Diagnosis not present

## 2020-05-30 DIAGNOSIS — H8110 Benign paroxysmal vertigo, unspecified ear: Secondary | ICD-10-CM | POA: Diagnosis not present

## 2020-05-30 DIAGNOSIS — I4891 Unspecified atrial fibrillation: Secondary | ICD-10-CM | POA: Diagnosis not present

## 2020-05-30 DIAGNOSIS — G5793 Unspecified mononeuropathy of bilateral lower limbs: Secondary | ICD-10-CM | POA: Diagnosis not present

## 2020-05-30 DIAGNOSIS — I5022 Chronic systolic (congestive) heart failure: Secondary | ICD-10-CM | POA: Diagnosis not present

## 2020-05-30 DIAGNOSIS — I130 Hypertensive heart and chronic kidney disease with heart failure and stage 1 through stage 4 chronic kidney disease, or unspecified chronic kidney disease: Secondary | ICD-10-CM | POA: Diagnosis not present

## 2020-05-30 DIAGNOSIS — S62101D Fracture of unspecified carpal bone, right wrist, subsequent encounter for fracture with routine healing: Secondary | ICD-10-CM | POA: Diagnosis not present

## 2020-05-30 DIAGNOSIS — G629 Polyneuropathy, unspecified: Secondary | ICD-10-CM | POA: Diagnosis not present

## 2020-05-30 DIAGNOSIS — I4892 Unspecified atrial flutter: Secondary | ICD-10-CM | POA: Diagnosis not present

## 2020-05-30 DIAGNOSIS — M1711 Unilateral primary osteoarthritis, right knee: Secondary | ICD-10-CM | POA: Diagnosis not present

## 2020-05-31 DIAGNOSIS — I4891 Unspecified atrial fibrillation: Secondary | ICD-10-CM | POA: Diagnosis not present

## 2020-05-31 DIAGNOSIS — S62101D Fracture of unspecified carpal bone, right wrist, subsequent encounter for fracture with routine healing: Secondary | ICD-10-CM | POA: Diagnosis not present

## 2020-05-31 DIAGNOSIS — R296 Repeated falls: Secondary | ICD-10-CM | POA: Diagnosis not present

## 2020-05-31 DIAGNOSIS — N189 Chronic kidney disease, unspecified: Secondary | ICD-10-CM | POA: Diagnosis not present

## 2020-05-31 DIAGNOSIS — G629 Polyneuropathy, unspecified: Secondary | ICD-10-CM | POA: Diagnosis not present

## 2020-05-31 DIAGNOSIS — G5793 Unspecified mononeuropathy of bilateral lower limbs: Secondary | ICD-10-CM | POA: Diagnosis not present

## 2020-05-31 DIAGNOSIS — I5022 Chronic systolic (congestive) heart failure: Secondary | ICD-10-CM | POA: Diagnosis not present

## 2020-05-31 DIAGNOSIS — M1711 Unilateral primary osteoarthritis, right knee: Secondary | ICD-10-CM | POA: Diagnosis not present

## 2020-05-31 DIAGNOSIS — I130 Hypertensive heart and chronic kidney disease with heart failure and stage 1 through stage 4 chronic kidney disease, or unspecified chronic kidney disease: Secondary | ICD-10-CM | POA: Diagnosis not present

## 2020-05-31 DIAGNOSIS — I4892 Unspecified atrial flutter: Secondary | ICD-10-CM | POA: Diagnosis not present

## 2020-05-31 DIAGNOSIS — H8110 Benign paroxysmal vertigo, unspecified ear: Secondary | ICD-10-CM | POA: Diagnosis not present

## 2020-06-01 DIAGNOSIS — I4892 Unspecified atrial flutter: Secondary | ICD-10-CM | POA: Diagnosis not present

## 2020-06-01 DIAGNOSIS — I4891 Unspecified atrial fibrillation: Secondary | ICD-10-CM | POA: Diagnosis not present

## 2020-06-01 DIAGNOSIS — M1711 Unilateral primary osteoarthritis, right knee: Secondary | ICD-10-CM | POA: Diagnosis not present

## 2020-06-01 DIAGNOSIS — G629 Polyneuropathy, unspecified: Secondary | ICD-10-CM | POA: Diagnosis not present

## 2020-06-01 DIAGNOSIS — N189 Chronic kidney disease, unspecified: Secondary | ICD-10-CM | POA: Diagnosis not present

## 2020-06-01 DIAGNOSIS — I5022 Chronic systolic (congestive) heart failure: Secondary | ICD-10-CM | POA: Diagnosis not present

## 2020-06-01 DIAGNOSIS — G5793 Unspecified mononeuropathy of bilateral lower limbs: Secondary | ICD-10-CM | POA: Diagnosis not present

## 2020-06-01 DIAGNOSIS — S62101D Fracture of unspecified carpal bone, right wrist, subsequent encounter for fracture with routine healing: Secondary | ICD-10-CM | POA: Diagnosis not present

## 2020-06-01 DIAGNOSIS — H8110 Benign paroxysmal vertigo, unspecified ear: Secondary | ICD-10-CM | POA: Diagnosis not present

## 2020-06-01 DIAGNOSIS — R296 Repeated falls: Secondary | ICD-10-CM | POA: Diagnosis not present

## 2020-06-01 DIAGNOSIS — I130 Hypertensive heart and chronic kidney disease with heart failure and stage 1 through stage 4 chronic kidney disease, or unspecified chronic kidney disease: Secondary | ICD-10-CM | POA: Diagnosis not present

## 2020-06-08 DIAGNOSIS — H8110 Benign paroxysmal vertigo, unspecified ear: Secondary | ICD-10-CM | POA: Diagnosis not present

## 2020-06-08 DIAGNOSIS — G5793 Unspecified mononeuropathy of bilateral lower limbs: Secondary | ICD-10-CM | POA: Diagnosis not present

## 2020-06-08 DIAGNOSIS — M1711 Unilateral primary osteoarthritis, right knee: Secondary | ICD-10-CM | POA: Diagnosis not present

## 2020-06-08 DIAGNOSIS — G629 Polyneuropathy, unspecified: Secondary | ICD-10-CM | POA: Diagnosis not present

## 2020-06-08 DIAGNOSIS — N189 Chronic kidney disease, unspecified: Secondary | ICD-10-CM | POA: Diagnosis not present

## 2020-06-08 DIAGNOSIS — I4891 Unspecified atrial fibrillation: Secondary | ICD-10-CM | POA: Diagnosis not present

## 2020-06-08 DIAGNOSIS — I5022 Chronic systolic (congestive) heart failure: Secondary | ICD-10-CM | POA: Diagnosis not present

## 2020-06-08 DIAGNOSIS — I4892 Unspecified atrial flutter: Secondary | ICD-10-CM | POA: Diagnosis not present

## 2020-06-08 DIAGNOSIS — S62101D Fracture of unspecified carpal bone, right wrist, subsequent encounter for fracture with routine healing: Secondary | ICD-10-CM | POA: Diagnosis not present

## 2020-06-08 DIAGNOSIS — R296 Repeated falls: Secondary | ICD-10-CM | POA: Diagnosis not present

## 2020-06-08 DIAGNOSIS — I130 Hypertensive heart and chronic kidney disease with heart failure and stage 1 through stage 4 chronic kidney disease, or unspecified chronic kidney disease: Secondary | ICD-10-CM | POA: Diagnosis not present

## 2020-06-15 DIAGNOSIS — I5022 Chronic systolic (congestive) heart failure: Secondary | ICD-10-CM | POA: Diagnosis not present

## 2020-06-15 DIAGNOSIS — M1711 Unilateral primary osteoarthritis, right knee: Secondary | ICD-10-CM | POA: Diagnosis not present

## 2020-06-15 DIAGNOSIS — S62101D Fracture of unspecified carpal bone, right wrist, subsequent encounter for fracture with routine healing: Secondary | ICD-10-CM | POA: Diagnosis not present

## 2020-06-15 DIAGNOSIS — I4891 Unspecified atrial fibrillation: Secondary | ICD-10-CM | POA: Diagnosis not present

## 2020-06-15 DIAGNOSIS — I130 Hypertensive heart and chronic kidney disease with heart failure and stage 1 through stage 4 chronic kidney disease, or unspecified chronic kidney disease: Secondary | ICD-10-CM | POA: Diagnosis not present

## 2020-06-15 DIAGNOSIS — H8110 Benign paroxysmal vertigo, unspecified ear: Secondary | ICD-10-CM | POA: Diagnosis not present

## 2020-06-15 DIAGNOSIS — N189 Chronic kidney disease, unspecified: Secondary | ICD-10-CM | POA: Diagnosis not present

## 2020-06-15 DIAGNOSIS — G629 Polyneuropathy, unspecified: Secondary | ICD-10-CM | POA: Diagnosis not present

## 2020-06-15 DIAGNOSIS — R296 Repeated falls: Secondary | ICD-10-CM | POA: Diagnosis not present

## 2020-06-15 DIAGNOSIS — I4892 Unspecified atrial flutter: Secondary | ICD-10-CM | POA: Diagnosis not present

## 2020-06-15 DIAGNOSIS — G5793 Unspecified mononeuropathy of bilateral lower limbs: Secondary | ICD-10-CM | POA: Diagnosis not present

## 2020-06-20 DIAGNOSIS — G5793 Unspecified mononeuropathy of bilateral lower limbs: Secondary | ICD-10-CM | POA: Diagnosis not present

## 2020-06-20 DIAGNOSIS — I4892 Unspecified atrial flutter: Secondary | ICD-10-CM | POA: Diagnosis not present

## 2020-06-20 DIAGNOSIS — M1711 Unilateral primary osteoarthritis, right knee: Secondary | ICD-10-CM | POA: Diagnosis not present

## 2020-06-20 DIAGNOSIS — I5022 Chronic systolic (congestive) heart failure: Secondary | ICD-10-CM | POA: Diagnosis not present

## 2020-06-20 DIAGNOSIS — H8110 Benign paroxysmal vertigo, unspecified ear: Secondary | ICD-10-CM | POA: Diagnosis not present

## 2020-06-20 DIAGNOSIS — S62101D Fracture of unspecified carpal bone, right wrist, subsequent encounter for fracture with routine healing: Secondary | ICD-10-CM | POA: Diagnosis not present

## 2020-06-20 DIAGNOSIS — N189 Chronic kidney disease, unspecified: Secondary | ICD-10-CM | POA: Diagnosis not present

## 2020-06-20 DIAGNOSIS — I4891 Unspecified atrial fibrillation: Secondary | ICD-10-CM | POA: Diagnosis not present

## 2020-06-20 DIAGNOSIS — G629 Polyneuropathy, unspecified: Secondary | ICD-10-CM | POA: Diagnosis not present

## 2020-06-20 DIAGNOSIS — I130 Hypertensive heart and chronic kidney disease with heart failure and stage 1 through stage 4 chronic kidney disease, or unspecified chronic kidney disease: Secondary | ICD-10-CM | POA: Diagnosis not present

## 2020-06-20 DIAGNOSIS — R296 Repeated falls: Secondary | ICD-10-CM | POA: Diagnosis not present

## 2020-06-22 ENCOUNTER — Telehealth: Payer: Self-pay | Admitting: Pharmacist

## 2020-06-22 NOTE — Chronic Care Management (AMB) (Signed)
    Chronic Care Management Pharmacy Assistant   Name: [name]  MRN: [id number] DOB: April 26, 1926  06/22/20- Called patient to remind of appointment with Gaylord Shih) on (06/23/20 at 1pm by phone)  Patient aware of appointment date, time, and type of appointment (either telephone or in person). Patient aware to have/bring all medications, supplements, blood pressure and/or blood sugar logs to visit.  Questions: Have you had any recent office visit or specialist visit outside of [facility]?  No.  Are there any concerns you would like to discuss during your office visit? Patient said her neuropathy is driving her up a wall. It hurts her legs and burns real bad and sometimes makes her leg cold.  Are you having any problems obtaining your medications? (Whether it pharmacy issues or cost) No. Patients son or daughter gets her medications for her.  If patient has any PAP medications ask if they are having any problems getting their PAP medication or refill?  No.  Star Rating Drug:  None.  Any gaps in medications fill history? N/A  [name] CMA  Clinical Pharmacist Assistant [phone]

## 2020-06-23 ENCOUNTER — Ambulatory Visit (INDEPENDENT_AMBULATORY_CARE_PROVIDER_SITE_OTHER): Payer: Medicare Other | Admitting: Pharmacist

## 2020-06-23 ENCOUNTER — Other Ambulatory Visit: Payer: Self-pay

## 2020-06-23 DIAGNOSIS — I130 Hypertensive heart and chronic kidney disease with heart failure and stage 1 through stage 4 chronic kidney disease, or unspecified chronic kidney disease: Secondary | ICD-10-CM | POA: Diagnosis not present

## 2020-06-23 DIAGNOSIS — I5022 Chronic systolic (congestive) heart failure: Secondary | ICD-10-CM

## 2020-06-23 DIAGNOSIS — I10 Essential (primary) hypertension: Secondary | ICD-10-CM

## 2020-06-23 DIAGNOSIS — I4891 Unspecified atrial fibrillation: Secondary | ICD-10-CM

## 2020-06-23 DIAGNOSIS — N189 Chronic kidney disease, unspecified: Secondary | ICD-10-CM | POA: Diagnosis not present

## 2020-06-23 DIAGNOSIS — S62101D Fracture of unspecified carpal bone, right wrist, subsequent encounter for fracture with routine healing: Secondary | ICD-10-CM | POA: Diagnosis not present

## 2020-06-23 DIAGNOSIS — R296 Repeated falls: Secondary | ICD-10-CM | POA: Diagnosis not present

## 2020-06-23 DIAGNOSIS — G629 Polyneuropathy, unspecified: Secondary | ICD-10-CM | POA: Diagnosis not present

## 2020-06-23 DIAGNOSIS — M1711 Unilateral primary osteoarthritis, right knee: Secondary | ICD-10-CM | POA: Diagnosis not present

## 2020-06-23 DIAGNOSIS — G5793 Unspecified mononeuropathy of bilateral lower limbs: Secondary | ICD-10-CM | POA: Diagnosis not present

## 2020-06-23 DIAGNOSIS — I4892 Unspecified atrial flutter: Secondary | ICD-10-CM | POA: Diagnosis not present

## 2020-06-23 DIAGNOSIS — H8110 Benign paroxysmal vertigo, unspecified ear: Secondary | ICD-10-CM | POA: Diagnosis not present

## 2020-06-23 NOTE — Progress Notes (Signed)
Chronic Care Management Pharmacy Note  07/07/2020 Name:  Myrene Bougher MRN:  [id number] DOB:  23-Jun-1926  Summary: BP is at goal < 140/90 per home readings Patient still reports some dizziness and thinks it is from beta blocker  Recommendations/Changes made from today's visit: -Recommended for patient and son to discuss with cardiology about reducing dose of beta blocker based on patient's preference -Recommended supplementation with calcium and vitamin D  Plan: Follow up in 2 months for BP assessment   Subjective: Charlita Brian is an 87 y.o. year old female who is a primary patient of Burchette, Alinda Sierras, MD.  The CCM team was consulted for assistance with disease management and care coordination needs.    Engaged with patient by telephone for follow up visit in response to provider referral for pharmacy case management and/or care coordination services.   Consent to Services:  The patient was given information about Chronic Care Management services, agreed to services, and gave verbal consent prior to initiation of services.  Please see initial visit note for detailed documentation.   Patient Care Team: Eulas Post, MD as PCP - General (Family Medicine) Nahser, Wonda Cheng, MD as PCP - Cardiology (Cardiology) Viona Gilmore, [facility] as Pharmacist (Pharmacist)  Recent office visits: 05/23/20 Meriam Sprague, RN: Patient presented for anti-coag visit. INR 1.7, goal 2-3. Boosted dose x 1. Continue 5 mg (5 mg x 1) every Tue, Thu, Sat; 2.5 mg (5 mg x 0.5) all other days.   03/18/20 Carolann Littler, MD: Patient presented for chronic conditions follow up. Increased furosemide to 40 mg BID as she gained 5 lbs from last visit.  02/05/20 Grier Mitts, MD: Patient presented for video visit for a sore throat. Advised to do COVID and strep testing.  Recent consult visits: 05/23/20 Gardiner Barefoot, DPM: Patient presented for pain due to onchomycosis. Follow up in 3 months.  Hospital  visits: None in previous 6 months   Objective:  Lab Results  Component Value Date   CREATININE 0.90 03/18/2020   BUN 15 03/18/2020   GFR 55.92 (L) 03/18/2020   GFRNONAA 47 (L) 09/04/2017   GFRAA 54 (L) 09/04/2017   NA 142 03/18/2020   K 3.7 03/18/2020   CALCIUM 9.1 03/18/2020   CO2 29 03/18/2020   GLUCOSE 87 03/18/2020    Lab Results  Component Value Date/Time   HGBA1C 5.8 08/06/2016 08:46 AM   HGBA1C  03/19/2008 04:23 AM    5.6 (NOTE)   The ADA recommends the following therapeutic goal for glycemic   control related to Hgb A1C measurement:   Goal of Therapy:   < 7.0% Hgb A1C   Reference: American Diabetes Association: Clinical Practice   Recommendations 2008, Diabetes Care,  2008, 31:(Suppl 1).   GFR 55.92 (L) 03/18/2020 09:09 AM   GFR 54.51 (L) 06/29/2019 09:13 AM    Last diabetic Eye exam: No results found for: HMDIABEYEEXA  Last diabetic Foot exam: No results found for: HMDIABFOOTEX   Lab Results  Component Value Date   CHOL 131 06/29/2019   HDL 56.20 06/29/2019   LDLCALC 53 06/29/2019   LDLDIRECT 142.0 12/16/2013   TRIG 107.0 06/29/2019   CHOLHDL 2 06/29/2019    Hepatic Function Latest Ref Rng & Units 06/29/2019 08/29/2018 10/04/2017  Total Protein 6.0 - 8.3 g/dL 6.2 7.0 6.4  Albumin 3.5 - 5.2 g/dL 4.0 4.5 4.0  AST 0 - 37 U/L 26 26 22   ALT 0 - 35 U/L 19 20 17   Alk Phosphatase 39 -  117 U/L 69 83 96  Total Bilirubin 0.2 - 1.2 mg/dL 1.6(H) 1.2 1.2  Bilirubin, Direct 0.0 - 0.3 mg/dL 0.3 0.3 0.3    Lab Results  Component Value Date/Time   TSH 3.93 02/01/2016 09:08 AM   TSH 3.82 07/30/2014 08:51 AM    CBC Latest Ref Rng & Units 08/29/2018 09/04/2017 02/01/2016  WBC 4.0 - 10.5 K/uL 6.1 5.3 6.5  Hemoglobin 12.0 - 15.0 g/dL 13.6 13.7 13.1  Hematocrit 36.0 - 46.0 % 40.3 40.1 38.9  Platelets 150.0 - 400.0 K/uL 123.0 Repeated and verified X2.(L) 125(L) 219.0    No results found for: VD25OH  Clinical ASCVD: No  The ASCVD Risk score Mikey Bussing DC Jr., et al., 2013)  failed to calculate for the following reasons:   The 2013 ASCVD risk score is only valid for ages 71 to 62    Depression screen PHQ 2/9 11/24/2019 12/29/2018 10/04/2017  Decreased Interest 0 0 0  Down, Depressed, Hopeless 0 0 0  PHQ - 2 Score 0 0 0  Altered sleeping 0 - -  Tired, decreased energy 0 - -  Change in appetite 0 - -  Feeling bad or failure about yourself  0 - -  Trouble concentrating 0 - -  Moving slowly or fidgety/restless 0 - -  Suicidal thoughts 0 - -  PHQ-9 Score 0 - -  Difficult doing work/chores Not difficult at all - -  Some recent data might be hidden     CHA2DS2/VAS Stroke Risk Points  Current as of 9 minutes ago     6 >= 2 Points: High Risk  1 - 1.99 Points: Medium Risk  0 Points: Low Risk    Last Change: N/A      Details    This score determines the patient's risk of having a stroke if the  patient has atrial fibrillation.       Points Metrics  1 Has Congestive Heart Failure:  Yes    Current as of 9 minutes ago  1 Has Vascular Disease:  Yes    Current as of 9 minutes ago  1 Has Hypertension:  Yes    Current as of 9 minutes ago  2 Age:  87    Current as of 9 minutes ago  0 Has Diabetes:  No    Current as of 9 minutes ago  0 Had Stroke:  No  Had TIA:  No  Had Thromboembolism:  No    Current as of 9 minutes ago  1 Female:  Yes    Current as of 9 minutes ago       Social History   Tobacco Use  Smoking Status Never  Smokeless Tobacco Never   BP Readings from Last 3 Encounters:  03/18/20 116/76  12/15/19 113/73  12/01/19 112/76   Pulse Readings from Last 3 Encounters:  03/18/20 82  12/15/19 89  12/01/19 80   Wt Readings from Last 3 Encounters:  03/18/20 158 lb 9.6 oz (71.9 kg)  02/05/20 153 lb (69.4 kg)  12/15/19 153 lb (69.4 kg)   BMI Readings from Last 3 Encounters:  03/18/20 25.99 kg/m  02/05/20 25.07 kg/m  12/15/19 25.07 kg/m    Assessment/Interventions: Review of patient past medical history, allergies, medications,  health status, including review of consultants reports, laboratory and other test data, was performed as part of comprehensive evaluation and provision of chronic care management services.   SDOH:  (Social Determinants of Health) assessments and interventions performed: No  SDOH  Screenings   Alcohol Screen: Low Risk    Last Alcohol Screening Score (AUDIT): 0  Depression (PHQ2-9): Low Risk    PHQ-2 Score: 0  Financial Resource Strain: Low Risk    Difficulty of Paying Living Expenses: Not hard at all  Food Insecurity: No Food Insecurity   Worried About Charity fundraiser in the Last Year: Never true   Ran Out of Food in the Last Year: Never true  Housing: Low Risk    Last Housing Risk Score: 0  Physical Activity: Inactive   Days of Exercise per Week: 0 days   Minutes of Exercise per Session: 0 min  Social Connections: Socially Isolated   Frequency of Communication with Friends and Family: More than three times a week   Frequency of Social Gatherings with Friends and Family: More than three times a week   Attends Religious Services: Never   Marine scientist or Organizations: No   Attends Music therapist: Never   Marital Status: Divorced  Stress: No Stress Concern Present   Feeling of Stress : Not at all  Tobacco Use: Low Risk    Smoking Tobacco Use: Never   Smokeless Tobacco Use: Never  Transportation Needs: No Transportation Needs   Lack of Transportation (Medical): No   Lack of Transportation (Non-Medical): No    CCM Care Plan  Allergies  Allergen Reactions   Gabapentin Nausea And Vomiting and Other (See Comments)    Did not feel like her self so she stopped.    Bactrim Nausea Only and Other (See Comments)    headache   Codeine Nausea Only   Contrast Media [Iodinated Diagnostic Agents] Other (See Comments)    Pt unsure of reaction   Nitrofurantoin Other (See Comments)    Pt unsure of reaction   Nitrofurantoin Monohyd Macro Other (See Comments)     Pt unsure of reaction   Penicillins Nausea And Vomiting   Sulfamethoxazole-Trimethoprim Other (See Comments)    Pt unsure of reaction    Medications Reviewed Today     Reviewed by Gardiner Barefoot, DPM (Physician) on 05/23/20 at [place] List Status: <None>   Medication Order Taking? Sig Documenting Provider Last Dose Status Informant  azelastine (OPTIVAR) 0.05 % ophthalmic solution [id number] No INSTILL 1 TO 2 DROPS INTO EACH EYE ONCE DAILY AS NEEDED FOR ALLERGIES [provider] Taking Active   cromolyn (OPTICROM) 4 % ophthalmic solution [id number] No INSTILL 1 DROP INTO EACH EYE TWICE DAILY AS DIRECTED AS NEEDED FOR ALLERGIES [provider] Taking Active   Ferrous Gluconate (IRON 27 PO) [id number] No Take 1 tablet by mouth daily. [provider] Taking Active   furosemide (LASIX) 40 MG tablet [id number] No TAKE 1 TABLET BY MOUTH ONCE DAILY IN THE MORNING AND 1/2 (ONE-HALF) TABLET ONCE DAILY IN THE EVENING  Patient taking differently: 40 mg 2 (two) times daily. TAKE 1 TABLET BY MOUTH BY MOUTH TWICE DAILY   Nahser, Wonda Cheng, MD Taking Active   hydrocortisone cream 0.5 % [id number] No Apply 1 application topically 2 (two) times daily. [provider] Taking Active   loratadine (CLARITIN) 10 MG tablet [id number] No Take 10 mg by mouth daily. [provider] Taking Active Self  metoprolol tartrate (LOPRESSOR) 100 MG tablet [id number] No TAKE 1 TABLET BY MOUTH TWICE DAILY Nahser, Wonda Cheng, MD Taking Active   Multiple Vitamin (MULTIVITAMIN) tablet [id number] No Take 1 tablet by mouth daily. [provider] Taking Active Self  nystatin  cream (MYCOSTATIN) [id number]  Apply 1 application topically 2 (two) times daily. Eulas Post, MD  Active   potassium chloride (KLOR-CON) 10 MEQ tablet [id number] No TAKE 3 TABLETS BY MOUTH  DAILY Burchette, Alinda Sierras, MD Taking Active   potassium chloride (KLOR-CON) 10 MEQ tablet [id number]  Take 3 tablets by mouth once  daily Burchette, Alinda Sierras, MD  Active   sodium fluoride (FLUORISHIELD) 1.1 % GEL dental gel [id number] No  [provider] Taking Active   warfarin (COUMADIN) 5 MG tablet [id number]  TAKE 1 TABLET BY MOUTH DAILY EXCEPT TAKE 1/2 TABLET ON MONDAY, WEDNESDAY, AND FRIDAY OR TAKE AS DIRECTED BY ANTICOAGULATION CLINIC [place], Alinda Sierras, MD  Active             Patient Active Problem List   Diagnosis Date Noted   Edema of both lower legs due to peripheral venous insufficiency 12/20/2015   CKD (chronic kidney disease) stage 3, GFR 30-59 ml/min ([place]) 08/17/2015   Hyperlipidemia 04/29/2014   Encounter for therapeutic drug monitoring 63/87/5643   Chronic systolic congestive heart failure ([place]) 06/08/2011   Vertigo 05/26/2011   Chronic anticoagulation 09/04/2010   Atrial fibrillation ([place]) 08/30/2010   Long term (current) use of anticoagulants 08/30/2010   External hemorrhoid, bleeding 08/14/2010   Macular degeneration 06/19/2010   Chest pain 05/24/2010   EXTERNAL HEMORRHOIDS WITHOUT MENTION COMP 02/01/2010   GERD 02/01/2010   Vitamin B 12 deficiency 11/25/2009   Essential hypertension 09/30/2009   OSTEOARTHRITIS, KNEE, RIGHT 02/08/2009   DIVERTICULITIS OF COLON 01/27/2009   OSTEOPENIA 01/27/2009   Polyneuropathy 01/27/2009   SMALL BOWEL OBSTRUCTION 05/05/2008   ABNORMAL FINDINGS GI TRACT 05/05/2008   COLONIC POLYPS, HX OF 05/05/2008    Immunization History  Administered Date(s) Administered   Fluad Quad(high Dose 65+) 08/29/2018   Influenza Split 12/01/2010, 09/21/2011   Influenza Whole 10/08/2008, 09/23/2009   Influenza, High Dose Seasonal PF 09/29/2014, 10/11/2016, 09/16/2017   Influenza,inj,Quad PF,6+ Mos 09/15/2012, 10/07/2013   Influenza-Unspecified 10/24/2015, 10/11/2016   Pneumococcal Conjugate-13 12/16/2013   Pneumococcal Polysaccharide-23 01/08/2006   Td 01/09/2003   Meals on wheels, supplemented, homemade  4 20 ounces bottles   Annual cardiology     Conditions to be addressed/monitored:  Hypertension, Hyperlipidemia, Atrial Fibrillation, and Heart Failure  Care Plan : CCM Pharmacy Care Plan  Updates made by Viona Gilmore, [place] since 07/07/2020 12:00 AM     Problem: Problem: Hypertension, Hyperlipidemia, Atrial Fibrillation, and Heart Failure      Long-Range Goal: Patient-Specific Goal   Start Date: 06/23/2020  Expected End Date: 06/23/2021  This Visit's Progress: On track  Priority: High  Note:   Current Barriers:  Unable to independently monitor therapeutic efficacy  Pharmacist Clinical Goal(s):  Patient will achieve adherence to monitoring guidelines and medication adherence to achieve therapeutic efficacy through collaboration with PharmD and provider.   Interventions: 1:1 collaboration with Eulas Post, MD regarding development and update of comprehensive plan of care as evidenced by provider attestation and co-signature Inter-disciplinary care team collaboration (see longitudinal plan of care) Comprehensive medication review performed; medication list updated in electronic medical record  Hypertension (BP goal <140/90) -Controlled -Current treatment: Metoprolol tartrate 189m, 1 tablet twice daily -Medications previously tried: spironolactone (chest pain)  -Current home readings: 128/70, 126/72, 108/64, 126/78, 122/76, 120/65, 126/72, 125/70, 106/64, 122/62, 118/78 -Current dietary habits: eating meals on wheels - low sodium option -Current exercise habits: no structured exercise -Reports hypotensive/hypertensive symptoms -Educated on Importance of home blood pressure monitoring; Proper BP monitoring  technique; Symptoms of hypotension and importance of maintaining adequate hydration; -Counseled to monitor BP at home weekly, document, and provide log at future appointments -Counseled on diet and exercise extensively Recommended to continue current medication  Hyperlipidemia: (LDL goal <  100) -Controlled -Current treatment: No medications -Medications previously tried: rosuvastatin (cramping), atorvastatin (unknown)  -Current dietary patterns: did not discuss -Current exercise habits: no structured exercise -Educated on Cholesterol goals;  -Counseled on diet and exercise extensively  Atrial Fibrillation (Goal: prevent stroke and major bleeding) -Controlled -CHADSVASC: 6 -Current treatment: Rate control: Metoprolol tartrate 149m, 1 tablet twice daily Anticoagulation: Warfarin 540m as directed by anticoag clinic  -Medications previously tried: n/a -Home BP and HR readings: 82, 93, 71, 70, 60, 73, 67, 87, 68, 60  -Counseled on increased risk of stroke due to Afib and benefits of anticoagulation for stroke prevention; -Recommended to continue current medication Counseled on monitoring heart rate and could consider dose decrease if HR is low. Patient's son preferred to hold off for now.  Heart Failure (Goal: manage symptoms and prevent exacerbations) -Controlled -Last ejection fraction: 25%  (Date: 03/2012) -HF type: Systolic -NYHA Class: III (marked limitation of activity) -AHA HF Stage: C (Heart disease and symptoms present) -Current treatment: Furosemide 4031m1 tablet twice daily Potassium chloride 65m53m3 tablets once daily Metoprolol tartrate 100mg83mtablet twice daily  -Medications previously tried: n/a  -Current home BP/HR readings: refer to above -Current dietary habits: meals on wheels with lower sodium meals -Current exercise habits: no structured exercise -Educated on Benefits of medications for managing symptoms and prolonging life Proper diuretic administration and potassium supplementation Importance of blood pressure control -Recommended to continue current medication  Allergic rhinitis (Goal: minimize symptoms) -Controlled -Current treatment  Loratadine 65mg,28mblet once daily  Azelastine 0.05% ophthalmic solution Cromolyn 4% ophthalmic  solution -Medications previously tried: none  -Recommended to continue current medication  Osteopenia (Goal prevent fractures) -Uncontrolled -Last DEXA Scan: 2010  T-Score femoral neck: -1.8  T-Score total hip: n/a  T-Score lumbar spine: -1.0  T-Score forearm radius: n/a  10-year probability of major osteoporotic fracture: 14%  10-year probability of hip fracture: 3.8% -Patient is a candidate for pharmacologic treatment due to T-Score -1.0 to -2.5 and 10-year risk of hip fracture > 3% -Current treatment  No medications -Medications previously tried: none  -Recommend [phone] units of vitamin D daily. Recommend 1200 mg of calcium daily from dietary and supplemental sources. -Counseled on diet and exercise extensively   Health Maintenance -Vaccine gaps: shingrix, COVID -Current therapy:  Ferrous gluconate daily Multivitamin 1 tablet daily -Educated on Cost vs benefit of each product must be carefully weighed by individual consumer -Patient is satisfied with current therapy and denies issues -Recommended to continue current medication  Patient Goals/Self-Care Activities Patient will:  - take medications as prescribed check blood pressure weekly, document, and provide at future appointments weigh daily, and contact provider if weight gain of > 3 lbs in one day or > 5 lbs in one week  Follow Up Plan: Telephone follow up appointment with care management team member scheduled for: 6 months       Medication Assistance: None required.  Patient affirms current coverage meets needs.  Compliance/Adherence/Medication fill history: Care Gaps: COVID vaccine, shingrix, tetanus  Star-Rating Drugs: Atorvastatin 20 mg  - last dispensed 12/21/19 for 90 ds at [facility]  [facility]:  [facility] [place] [address] [address]: [phone]  [facility]  ([place], [address] [place] [address] Phone: [phone] Fax: [phone]  Uses pill box? Yes Pt endorses 90% compliance  We discussed: Current pharmacy is preferred with insurance plan and patient is satisfied with pharmacy services Patient decided to: Continue current medication management strategy  Care Plan and Follow Up Patient Decision:  Patient agrees to Care Plan and Follow-up.  Plan: Telephone follow up appointment with care management team member scheduled for:  6 months  Jeni Salles, PharmD, [place] Pharmacist [place] at [place] [phone]

## 2020-06-27 DIAGNOSIS — I5022 Chronic systolic (congestive) heart failure: Secondary | ICD-10-CM | POA: Diagnosis not present

## 2020-06-27 DIAGNOSIS — G5793 Unspecified mononeuropathy of bilateral lower limbs: Secondary | ICD-10-CM | POA: Diagnosis not present

## 2020-06-27 DIAGNOSIS — M1711 Unilateral primary osteoarthritis, right knee: Secondary | ICD-10-CM | POA: Diagnosis not present

## 2020-06-27 DIAGNOSIS — I4892 Unspecified atrial flutter: Secondary | ICD-10-CM | POA: Diagnosis not present

## 2020-06-27 DIAGNOSIS — I4891 Unspecified atrial fibrillation: Secondary | ICD-10-CM | POA: Diagnosis not present

## 2020-06-27 DIAGNOSIS — N189 Chronic kidney disease, unspecified: Secondary | ICD-10-CM | POA: Diagnosis not present

## 2020-06-27 DIAGNOSIS — R296 Repeated falls: Secondary | ICD-10-CM | POA: Diagnosis not present

## 2020-06-27 DIAGNOSIS — I130 Hypertensive heart and chronic kidney disease with heart failure and stage 1 through stage 4 chronic kidney disease, or unspecified chronic kidney disease: Secondary | ICD-10-CM | POA: Diagnosis not present

## 2020-06-27 DIAGNOSIS — S62101D Fracture of unspecified carpal bone, right wrist, subsequent encounter for fracture with routine healing: Secondary | ICD-10-CM | POA: Diagnosis not present

## 2020-06-27 DIAGNOSIS — G629 Polyneuropathy, unspecified: Secondary | ICD-10-CM | POA: Diagnosis not present

## 2020-06-27 DIAGNOSIS — H8110 Benign paroxysmal vertigo, unspecified ear: Secondary | ICD-10-CM | POA: Diagnosis not present

## 2020-06-29 DIAGNOSIS — R296 Repeated falls: Secondary | ICD-10-CM | POA: Diagnosis not present

## 2020-06-29 DIAGNOSIS — N189 Chronic kidney disease, unspecified: Secondary | ICD-10-CM | POA: Diagnosis not present

## 2020-06-29 DIAGNOSIS — I4891 Unspecified atrial fibrillation: Secondary | ICD-10-CM | POA: Diagnosis not present

## 2020-06-29 DIAGNOSIS — I5022 Chronic systolic (congestive) heart failure: Secondary | ICD-10-CM | POA: Diagnosis not present

## 2020-06-29 DIAGNOSIS — H8110 Benign paroxysmal vertigo, unspecified ear: Secondary | ICD-10-CM | POA: Diagnosis not present

## 2020-06-29 DIAGNOSIS — M1711 Unilateral primary osteoarthritis, right knee: Secondary | ICD-10-CM | POA: Diagnosis not present

## 2020-06-29 DIAGNOSIS — S62101D Fracture of unspecified carpal bone, right wrist, subsequent encounter for fracture with routine healing: Secondary | ICD-10-CM | POA: Diagnosis not present

## 2020-06-29 DIAGNOSIS — I4892 Unspecified atrial flutter: Secondary | ICD-10-CM | POA: Diagnosis not present

## 2020-06-29 DIAGNOSIS — I130 Hypertensive heart and chronic kidney disease with heart failure and stage 1 through stage 4 chronic kidney disease, or unspecified chronic kidney disease: Secondary | ICD-10-CM | POA: Diagnosis not present

## 2020-06-29 DIAGNOSIS — G629 Polyneuropathy, unspecified: Secondary | ICD-10-CM | POA: Diagnosis not present

## 2020-06-29 DIAGNOSIS — G5793 Unspecified mononeuropathy of bilateral lower limbs: Secondary | ICD-10-CM | POA: Diagnosis not present

## 2020-07-04 ENCOUNTER — Ambulatory Visit (INDEPENDENT_AMBULATORY_CARE_PROVIDER_SITE_OTHER): Payer: Medicare Other | Admitting: General Practice

## 2020-07-04 ENCOUNTER — Other Ambulatory Visit: Payer: Self-pay

## 2020-07-04 DIAGNOSIS — Z7901 Long term (current) use of anticoagulants: Secondary | ICD-10-CM

## 2020-07-04 DIAGNOSIS — I4891 Unspecified atrial fibrillation: Secondary | ICD-10-CM | POA: Diagnosis not present

## 2020-07-04 LAB — POCT INR: INR: 1.7 — AB (ref 2.0–3.0)

## 2020-07-04 NOTE — Patient Instructions (Signed)
Pre visit review using our clinic review tool, if applicable. No additional management support is needed unless otherwise documented below in the visit note.  Take 1 tablet today (6/27) and then change dosage and take 1 tablet daily except 1/2 tablet on Mon Wed and Fridays.  Re-check in 4 weeks.

## 2020-07-05 DIAGNOSIS — I4891 Unspecified atrial fibrillation: Secondary | ICD-10-CM | POA: Diagnosis not present

## 2020-07-05 DIAGNOSIS — I5022 Chronic systolic (congestive) heart failure: Secondary | ICD-10-CM | POA: Diagnosis not present

## 2020-07-05 DIAGNOSIS — R296 Repeated falls: Secondary | ICD-10-CM | POA: Diagnosis not present

## 2020-07-05 DIAGNOSIS — G5793 Unspecified mononeuropathy of bilateral lower limbs: Secondary | ICD-10-CM | POA: Diagnosis not present

## 2020-07-05 DIAGNOSIS — H8110 Benign paroxysmal vertigo, unspecified ear: Secondary | ICD-10-CM | POA: Diagnosis not present

## 2020-07-05 DIAGNOSIS — N189 Chronic kidney disease, unspecified: Secondary | ICD-10-CM | POA: Diagnosis not present

## 2020-07-05 DIAGNOSIS — G629 Polyneuropathy, unspecified: Secondary | ICD-10-CM | POA: Diagnosis not present

## 2020-07-05 DIAGNOSIS — M1711 Unilateral primary osteoarthritis, right knee: Secondary | ICD-10-CM | POA: Diagnosis not present

## 2020-07-05 DIAGNOSIS — S62101D Fracture of unspecified carpal bone, right wrist, subsequent encounter for fracture with routine healing: Secondary | ICD-10-CM | POA: Diagnosis not present

## 2020-07-05 DIAGNOSIS — I130 Hypertensive heart and chronic kidney disease with heart failure and stage 1 through stage 4 chronic kidney disease, or unspecified chronic kidney disease: Secondary | ICD-10-CM | POA: Diagnosis not present

## 2020-07-05 DIAGNOSIS — I4892 Unspecified atrial flutter: Secondary | ICD-10-CM | POA: Diagnosis not present

## 2020-07-07 ENCOUNTER — Encounter: Payer: Self-pay | Admitting: Podiatry

## 2020-07-07 ENCOUNTER — Telehealth: Payer: Self-pay | Admitting: Podiatry

## 2020-07-07 NOTE — Telephone Encounter (Signed)
Called patient lvm to reschedule 08/29/2020 and sent reschedule letter.

## 2020-07-12 DIAGNOSIS — G5793 Unspecified mononeuropathy of bilateral lower limbs: Secondary | ICD-10-CM | POA: Diagnosis not present

## 2020-07-12 DIAGNOSIS — H8110 Benign paroxysmal vertigo, unspecified ear: Secondary | ICD-10-CM | POA: Diagnosis not present

## 2020-07-12 DIAGNOSIS — S62101D Fracture of unspecified carpal bone, right wrist, subsequent encounter for fracture with routine healing: Secondary | ICD-10-CM | POA: Diagnosis not present

## 2020-07-12 DIAGNOSIS — R296 Repeated falls: Secondary | ICD-10-CM | POA: Diagnosis not present

## 2020-07-12 DIAGNOSIS — N189 Chronic kidney disease, unspecified: Secondary | ICD-10-CM | POA: Diagnosis not present

## 2020-07-12 DIAGNOSIS — I4892 Unspecified atrial flutter: Secondary | ICD-10-CM | POA: Diagnosis not present

## 2020-07-12 DIAGNOSIS — G629 Polyneuropathy, unspecified: Secondary | ICD-10-CM | POA: Diagnosis not present

## 2020-07-12 DIAGNOSIS — I4891 Unspecified atrial fibrillation: Secondary | ICD-10-CM | POA: Diagnosis not present

## 2020-07-12 DIAGNOSIS — I5022 Chronic systolic (congestive) heart failure: Secondary | ICD-10-CM | POA: Diagnosis not present

## 2020-07-12 DIAGNOSIS — M1711 Unilateral primary osteoarthritis, right knee: Secondary | ICD-10-CM | POA: Diagnosis not present

## 2020-07-12 DIAGNOSIS — I130 Hypertensive heart and chronic kidney disease with heart failure and stage 1 through stage 4 chronic kidney disease, or unspecified chronic kidney disease: Secondary | ICD-10-CM | POA: Diagnosis not present

## 2020-07-15 DIAGNOSIS — M1711 Unilateral primary osteoarthritis, right knee: Secondary | ICD-10-CM | POA: Diagnosis not present

## 2020-07-15 DIAGNOSIS — N189 Chronic kidney disease, unspecified: Secondary | ICD-10-CM | POA: Diagnosis not present

## 2020-07-15 DIAGNOSIS — I4891 Unspecified atrial fibrillation: Secondary | ICD-10-CM | POA: Diagnosis not present

## 2020-07-15 DIAGNOSIS — H8110 Benign paroxysmal vertigo, unspecified ear: Secondary | ICD-10-CM | POA: Diagnosis not present

## 2020-07-15 DIAGNOSIS — I5022 Chronic systolic (congestive) heart failure: Secondary | ICD-10-CM | POA: Diagnosis not present

## 2020-07-15 DIAGNOSIS — G5793 Unspecified mononeuropathy of bilateral lower limbs: Secondary | ICD-10-CM | POA: Diagnosis not present

## 2020-07-15 DIAGNOSIS — R296 Repeated falls: Secondary | ICD-10-CM | POA: Diagnosis not present

## 2020-07-15 DIAGNOSIS — G629 Polyneuropathy, unspecified: Secondary | ICD-10-CM | POA: Diagnosis not present

## 2020-07-15 DIAGNOSIS — I130 Hypertensive heart and chronic kidney disease with heart failure and stage 1 through stage 4 chronic kidney disease, or unspecified chronic kidney disease: Secondary | ICD-10-CM | POA: Diagnosis not present

## 2020-07-15 DIAGNOSIS — I4892 Unspecified atrial flutter: Secondary | ICD-10-CM | POA: Diagnosis not present

## 2020-07-15 DIAGNOSIS — S62101D Fracture of unspecified carpal bone, right wrist, subsequent encounter for fracture with routine healing: Secondary | ICD-10-CM | POA: Diagnosis not present

## 2020-07-19 DIAGNOSIS — I4892 Unspecified atrial flutter: Secondary | ICD-10-CM | POA: Diagnosis not present

## 2020-07-19 DIAGNOSIS — I4891 Unspecified atrial fibrillation: Secondary | ICD-10-CM | POA: Diagnosis not present

## 2020-07-19 DIAGNOSIS — I5022 Chronic systolic (congestive) heart failure: Secondary | ICD-10-CM | POA: Diagnosis not present

## 2020-07-19 DIAGNOSIS — H8110 Benign paroxysmal vertigo, unspecified ear: Secondary | ICD-10-CM | POA: Diagnosis not present

## 2020-07-19 DIAGNOSIS — S62101D Fracture of unspecified carpal bone, right wrist, subsequent encounter for fracture with routine healing: Secondary | ICD-10-CM | POA: Diagnosis not present

## 2020-07-19 DIAGNOSIS — I130 Hypertensive heart and chronic kidney disease with heart failure and stage 1 through stage 4 chronic kidney disease, or unspecified chronic kidney disease: Secondary | ICD-10-CM | POA: Diagnosis not present

## 2020-07-19 DIAGNOSIS — N189 Chronic kidney disease, unspecified: Secondary | ICD-10-CM | POA: Diagnosis not present

## 2020-07-19 DIAGNOSIS — G5793 Unspecified mononeuropathy of bilateral lower limbs: Secondary | ICD-10-CM | POA: Diagnosis not present

## 2020-07-19 DIAGNOSIS — G629 Polyneuropathy, unspecified: Secondary | ICD-10-CM | POA: Diagnosis not present

## 2020-07-19 DIAGNOSIS — M1711 Unilateral primary osteoarthritis, right knee: Secondary | ICD-10-CM | POA: Diagnosis not present

## 2020-07-19 DIAGNOSIS — R296 Repeated falls: Secondary | ICD-10-CM | POA: Diagnosis not present

## 2020-07-25 DIAGNOSIS — R296 Repeated falls: Secondary | ICD-10-CM | POA: Diagnosis not present

## 2020-07-25 DIAGNOSIS — I5022 Chronic systolic (congestive) heart failure: Secondary | ICD-10-CM | POA: Diagnosis not present

## 2020-07-25 DIAGNOSIS — G5793 Unspecified mononeuropathy of bilateral lower limbs: Secondary | ICD-10-CM | POA: Diagnosis not present

## 2020-07-25 DIAGNOSIS — I4892 Unspecified atrial flutter: Secondary | ICD-10-CM | POA: Diagnosis not present

## 2020-07-25 DIAGNOSIS — I130 Hypertensive heart and chronic kidney disease with heart failure and stage 1 through stage 4 chronic kidney disease, or unspecified chronic kidney disease: Secondary | ICD-10-CM | POA: Diagnosis not present

## 2020-07-25 DIAGNOSIS — H8110 Benign paroxysmal vertigo, unspecified ear: Secondary | ICD-10-CM | POA: Diagnosis not present

## 2020-07-25 DIAGNOSIS — I4891 Unspecified atrial fibrillation: Secondary | ICD-10-CM | POA: Diagnosis not present

## 2020-07-25 DIAGNOSIS — G629 Polyneuropathy, unspecified: Secondary | ICD-10-CM | POA: Diagnosis not present

## 2020-07-25 DIAGNOSIS — M1711 Unilateral primary osteoarthritis, right knee: Secondary | ICD-10-CM | POA: Diagnosis not present

## 2020-07-25 DIAGNOSIS — S62101D Fracture of unspecified carpal bone, right wrist, subsequent encounter for fracture with routine healing: Secondary | ICD-10-CM | POA: Diagnosis not present

## 2020-07-25 DIAGNOSIS — N189 Chronic kidney disease, unspecified: Secondary | ICD-10-CM | POA: Diagnosis not present

## 2020-08-01 ENCOUNTER — Ambulatory Visit (INDEPENDENT_AMBULATORY_CARE_PROVIDER_SITE_OTHER): Payer: Medicare Other | Admitting: General Practice

## 2020-08-01 ENCOUNTER — Other Ambulatory Visit: Payer: Self-pay

## 2020-08-01 DIAGNOSIS — Z7901 Long term (current) use of anticoagulants: Secondary | ICD-10-CM

## 2020-08-01 DIAGNOSIS — I130 Hypertensive heart and chronic kidney disease with heart failure and stage 1 through stage 4 chronic kidney disease, or unspecified chronic kidney disease: Secondary | ICD-10-CM | POA: Diagnosis not present

## 2020-08-01 DIAGNOSIS — G629 Polyneuropathy, unspecified: Secondary | ICD-10-CM | POA: Diagnosis not present

## 2020-08-01 DIAGNOSIS — N189 Chronic kidney disease, unspecified: Secondary | ICD-10-CM | POA: Diagnosis not present

## 2020-08-01 DIAGNOSIS — I5022 Chronic systolic (congestive) heart failure: Secondary | ICD-10-CM | POA: Diagnosis not present

## 2020-08-01 DIAGNOSIS — I4891 Unspecified atrial fibrillation: Secondary | ICD-10-CM

## 2020-08-01 DIAGNOSIS — R296 Repeated falls: Secondary | ICD-10-CM | POA: Diagnosis not present

## 2020-08-01 DIAGNOSIS — M1711 Unilateral primary osteoarthritis, right knee: Secondary | ICD-10-CM | POA: Diagnosis not present

## 2020-08-01 DIAGNOSIS — S62101D Fracture of unspecified carpal bone, right wrist, subsequent encounter for fracture with routine healing: Secondary | ICD-10-CM | POA: Diagnosis not present

## 2020-08-01 DIAGNOSIS — G5793 Unspecified mononeuropathy of bilateral lower limbs: Secondary | ICD-10-CM | POA: Diagnosis not present

## 2020-08-01 DIAGNOSIS — I4892 Unspecified atrial flutter: Secondary | ICD-10-CM | POA: Diagnosis not present

## 2020-08-01 DIAGNOSIS — H8110 Benign paroxysmal vertigo, unspecified ear: Secondary | ICD-10-CM | POA: Diagnosis not present

## 2020-08-01 LAB — POCT INR: INR: 1.9 — AB (ref 2.0–3.0)

## 2020-08-01 NOTE — Patient Instructions (Addendum)
Pre visit review using our clinic review tool, if applicable. No additional management support is needed unless otherwise documented below in the visit note.  Take 1 tablet today (7/25) and then change dosage and take 1 tablet daily except 1/2 tablet on Mon and Fridays.  Re-check in 5 weeks.

## 2020-08-12 ENCOUNTER — Ambulatory Visit: Payer: Medicare Other | Admitting: Podiatry

## 2020-08-12 ENCOUNTER — Encounter: Payer: Self-pay | Admitting: Podiatry

## 2020-08-12 ENCOUNTER — Other Ambulatory Visit: Payer: Self-pay

## 2020-08-12 DIAGNOSIS — L608 Other nail disorders: Secondary | ICD-10-CM | POA: Diagnosis not present

## 2020-08-12 DIAGNOSIS — M79675 Pain in left toe(s): Secondary | ICD-10-CM | POA: Diagnosis not present

## 2020-08-12 DIAGNOSIS — M79674 Pain in right toe(s): Secondary | ICD-10-CM

## 2020-08-12 DIAGNOSIS — M19079 Primary osteoarthritis, unspecified ankle and foot: Secondary | ICD-10-CM | POA: Diagnosis not present

## 2020-08-12 DIAGNOSIS — B351 Tinea unguium: Secondary | ICD-10-CM

## 2020-08-12 DIAGNOSIS — G629 Polyneuropathy, unspecified: Secondary | ICD-10-CM

## 2020-08-12 NOTE — Progress Notes (Addendum)
This patient returns to my office for at risk foot care.  This patient requires this care by a professional since this patient will be at risk due to having CKD and polyneuropathy..  She presents to the office with her son.  He requests an ankle injection left ankle for the pain in her ankle. This patient is unable to cut nails herself since the patient cannot reach her nails.These nails are painful walking and wearing shoes.  This patient presents for at risk foot care today.  General Appearance  Alert, conversant and in no acute stress.  Vascular  Dorsalis pedis and posterior tibial  pulses are weakly  palpable  bilaterally.  Capillary return is within normal limits  bilaterally. Cold feet  Bilaterally.  Absent digital hair  B/L.  Neurologic  Senn-Weinstein monofilament wire test within normal limits/diminished   bilaterally. Muscle power within normal limits bilaterally.  Nails Thick disfigured discolored nails with subungual debris  from hallux to fifth toes bilaterally. No evidence of bacterial infection or drainage bilaterally.  Orthopedic  .  No crepitus or effusions noted.  No bony pathology or digital deformities noted.  Limited ROM  STJ left foot.  Swelling ankle left foot.  Skin  normotropic skin with no porokeratosis noted bilaterally.  No signs of infections or ulcers noted.     Onychomycosis  Pain in right toes  Pain in left toes  Ankle arthritis left.  Consent was obtained for treatment procedures.   Mechanical debridement of nails 1-5  bilaterally performed with a nail nipper.  Filed with dremel without incident.  Dr.  Allena Katz evaluated and provided an injection left ankle.  Injection of mixture of marcaine xylocaine and kenalog  10. Patient to follow up with Dr.  Allena Katz for ankle treatment.   Return office visit    3 months                  Told patient to return for periodic foot care and evaluation due to potential at risk complications.   Helane Gunther DPM

## 2020-08-19 ENCOUNTER — Telehealth: Payer: Self-pay | Admitting: Family Medicine

## 2020-08-19 ENCOUNTER — Encounter: Payer: Self-pay | Admitting: Family Medicine

## 2020-08-19 NOTE — Telephone Encounter (Signed)
Spoke with the patients son. He is aware that the letter has been completed and faxed to the requested number.

## 2020-08-19 NOTE — Telephone Encounter (Signed)
PT son called to advise that the PT is having a root canal surgery on August 30th and the Dentist Kalman Jewels needs a clearance to either stop the warfarin (COUMADIN) 5 MG tablet for 24 hrs or whatever the Dr recommends to do. Their fax for clearance info is [phone].

## 2020-08-22 ENCOUNTER — Telehealth: Payer: Self-pay | Admitting: Family Medicine

## 2020-08-22 ENCOUNTER — Other Ambulatory Visit: Payer: Self-pay | Admitting: Family Medicine

## 2020-08-22 NOTE — Telephone Encounter (Signed)
[name] from [facility] office call and stated she need to know what is dr.Burchette recommendation for tooth extraction and want something she over her # is [phone] and fax # is [phone].

## 2020-08-22 NOTE — Telephone Encounter (Signed)
Left message for [name] to call back.

## 2020-08-23 NOTE — Telephone Encounter (Signed)
Left message for Nancy to call back.

## 2020-08-24 NOTE — Telephone Encounter (Signed)
Left message for Harriett Sine to call back. Unable to reach [place]. Pt has picked up the letter we wrote to give to them. Message will be closed.

## 2020-08-25 ENCOUNTER — Other Ambulatory Visit: Payer: Self-pay | Admitting: Family Medicine

## 2020-08-29 ENCOUNTER — Ambulatory Visit (INDEPENDENT_AMBULATORY_CARE_PROVIDER_SITE_OTHER): Payer: Medicare Other

## 2020-08-29 ENCOUNTER — Ambulatory Visit: Payer: Medicare Other | Admitting: Podiatry

## 2020-08-29 DIAGNOSIS — Z7901 Long term (current) use of anticoagulants: Secondary | ICD-10-CM | POA: Diagnosis not present

## 2020-08-29 LAB — POCT INR: INR: 2.3 (ref 2.0–3.0)

## 2020-08-29 NOTE — Patient Instructions (Addendum)
Pre visit review using our clinic review tool, if applicable. No additional management support is needed unless otherwise documented below in the visit note.  Continue to take 1 tablet daily except 1/2 tablet on Mon and Fridays.  Re-check in  4 weeks. Stop and start coumadin as directed by Dr. Caryl Never for dental procedure.

## 2020-08-31 ENCOUNTER — Encounter: Payer: Self-pay | Admitting: Family Medicine

## 2020-08-31 ENCOUNTER — Ambulatory Visit (INDEPENDENT_AMBULATORY_CARE_PROVIDER_SITE_OTHER): Payer: Medicare Other | Admitting: Family Medicine

## 2020-08-31 ENCOUNTER — Other Ambulatory Visit: Payer: Self-pay

## 2020-08-31 VITALS — BP 124/80 | HR 76 | Temp 97.6°F | Wt 157.3 lb

## 2020-08-31 DIAGNOSIS — I4811 Longstanding persistent atrial fibrillation: Secondary | ICD-10-CM

## 2020-08-31 DIAGNOSIS — I5022 Chronic systolic (congestive) heart failure: Secondary | ICD-10-CM | POA: Diagnosis not present

## 2020-08-31 DIAGNOSIS — Z9181 History of falling: Secondary | ICD-10-CM

## 2020-08-31 DIAGNOSIS — B372 Candidiasis of skin and nail: Secondary | ICD-10-CM | POA: Diagnosis not present

## 2020-08-31 DIAGNOSIS — G629 Polyneuropathy, unspecified: Secondary | ICD-10-CM

## 2020-08-31 MED ORDER — NYSTATIN 100000 UNIT/GM EX CREA: 1.0000 "application " | TOPICAL_CREAM | Freq: Two times a day (BID) | CUTANEOUS | 2 refills | Status: AC

## 2020-08-31 NOTE — Progress Notes (Signed)
Established Patient Office Visit  Subjective:  Patient ID: [name], female    DOB: 04/01/1926  Age: 87 y.o. MRN: [id number]  CC:  Chief Complaint  Patient presents with   Annual Exam    HPI [name] presents for routine medical follow-up.  She has history of systolic heart failure, B12 deficiency, atrial fibrillation, chronic peripheral neuropathy.  She is accompanied by her son has been helping her significantly.  She has become less ambulatory in recent years.  Does not leave her house as often.  She has had home physical therapy and OT & would like to get this renewed.  They do feel like she has benefited from home adaptation in multiple ways and also from physical therapy.  Her biggest challenge is transfers and weakness lower extremities.  She has difficulty with balance because of her neuropathy.  She has history of systolic heart failure.  She does complain of dyspnea chronically with activity but unchanged.  No recent increased weight.  No complaints of orthopnea.  Medications reviewed.  She remains on Lasix, Lopressor, potassium, Coumadin.  She is followed through the Coumadin clinic regularly.  Recurrent rash under both breast.  Has responded well to nystatin in the past.  Son requesting refills.  Past Medical History:  Diagnosis Date   Atrial fib/flutter, transient    B12 DEFICIENCY 11/25/2009   CHF 01/27/2009   Chronic systolic heart failure (HCC) 07/25/2009   a. Myoview 5/12:  Low risk, no ischemia, apical lateral defect probably breast attenuation, inf HK, EF 45%.  b. Echo 8/12: EF 45-50%, inf/inf-septal and apical HK, mild LVH, mild to mod MR, mild to mod TR, PASP 50;  c.  Echo 3/14:  Mild LVH, EF 20%, diff HK, mild to mod MR, mild LAE, mild to mod reduced RVSF, mild RAE, mild to mod TR, PASP 41   DIVERTICULITIS OF COLON 01/27/2009   ESSENTIAL HYPERTENSION 09/30/2009   Hemorrhoids    Hypertension    Left ventricular dysfunction    OSTEOARTHRITIS, KNEE,  RIGHT 02/08/2009   OSTEOPENIA 01/27/2009   Rectal bleeding    SMALL BOWEL OBSTRUCTION 05/05/2008    Past Surgical History:  Procedure Laterality Date   ABDOMINAL HYSTERECTOMY  1996   TAHBSO   APPENDECTOMY  1951   CARDIAC CATHETERIZATION  April 14, 2012   nonobstructive CAD; severe LV dysfunction.    COLON SURGERY  2006   Sigmoid colectomy for diverticulitis   TONSILLECTOMY  1966    Family History  Problem Relation Age of Onset   Heart disease Father    Heart disease Sister    Heart disease Other    Emphysema Other     Social History   Socioeconomic History   Marital status: Widowed    Spouse name: Not on file   Number of children: Not on file   Years of education: Not on file   Highest education level: Not on file  Occupational History   Not on file  Tobacco Use   Smoking status: Never   Smokeless tobacco: Never  Vaping Use   Vaping Use: Never used  Substance and Sexual Activity   Alcohol use: No   Drug use: No   Sexual activity: Not Currently  Other Topics Concern   Not on file  Social History Narrative   Not on file   Social Determinants of Health   Financial Resource Strain: Low Risk    Difficulty of Paying Living Expenses: Not hard at all  Food Insecurity: No  Food Insecurity   Worried About Programme researcher, broadcasting/film/video in the Last Year: Never true   Ran Out of Food in the Last Year: Never true  Transportation Needs: No Transportation Needs   Lack of Transportation (Medical): No   Lack of Transportation (Non-Medical): No  Physical Activity: Inactive   Days of Exercise per Week: 0 days   Minutes of Exercise per Session: 0 min  Stress: No Stress Concern Present   Feeling of Stress : Not at all  Social Connections: Socially Isolated   Frequency of Communication with Friends and Family: More than three times a week   Frequency of Social Gatherings with Friends and Family: More than three times a week   Attends Religious Services: Never   Database administrator or  Organizations: No   Attends Engineer, structural: Never   Marital Status: Divorced  Catering manager Violence: Not At Risk   Fear of Current or Ex-Partner: No   Emotionally Abused: No   Physically Abused: No   Sexually Abused: No    Outpatient Medications Prior to Visit  Medication Sig Dispense Refill   azelastine (OPTIVAR) 0.05 % ophthalmic solution INSTILL 1 TO 2 DROPS INTO EACH EYE ONCE DAILY AS NEEDED FOR ALLERGIES     cromolyn (OPTICROM) 4 % ophthalmic solution INSTILL 1 DROP INTO EACH EYE TWICE DAILY AS DIRECTED AS NEEDED FOR ALLERGIES     Ferrous Gluconate (IRON 27 PO) Take 1 tablet by mouth daily.     furosemide (LASIX) 40 MG tablet TAKE 1 TABLET BY MOUTH ONCE DAILY IN THE MORNING AND 1/2 (ONE-HALF) TABLET ONCE DAILY IN THE EVENING (Patient taking differently: 40 mg 2 (two) times daily. TAKE 1 TABLET BY MOUTH BY MOUTH TWICE DAILY) 135 tablet 3   hydrocortisone cream 0.5 % Apply 1 application topically 2 (two) times daily.     loratadine (CLARITIN) 10 MG tablet Take 10 mg by mouth daily.     metoprolol tartrate (LOPRESSOR) 100 MG tablet TAKE 1 TABLET BY MOUTH TWICE DAILY 180 tablet 3   potassium chloride (KLOR-CON) 10 MEQ tablet TAKE 3 TABLETS BY MOUTH  DAILY 270 tablet 3   sodium fluoride (FLUORISHIELD) 1.1 % GEL dental gel      warfarin (COUMADIN) 5 MG tablet TAKE 1 TABLET BY MOUTH DAILY EXCEPT TAKE 1/2 TABLET ON MONDAY, WEDNESDAY, AND FRIDAY OR TAKE AS DIRECTED BY ANTICOAGULATION CLINIC 90 tablet 0   nystatin cream (MYCOSTATIN) Apply 1 application topically 2 (two) times daily. 30 g 1   potassium chloride (KLOR-CON) 10 MEQ tablet Take 3 tablets by mouth once daily 270 tablet 0   No facility-administered medications prior to visit.    Allergies  Allergen Reactions   Gabapentin Nausea And Vomiting and Other (See Comments)    Did not feel like her self so she stopped.    Bactrim Nausea Only and Other (See Comments)    headache   Codeine Nausea Only   Contrast Media  [Iodinated Diagnostic Agents] Other (See Comments)    Pt unsure of reaction   Nitrofurantoin Other (See Comments)    Pt unsure of reaction   Nitrofurantoin Monohyd Macro Other (See Comments)    Pt unsure of reaction   Penicillins Nausea And Vomiting   Sulfamethoxazole-Trimethoprim Other (See Comments)    Pt unsure of reaction    ROS Review of Systems  Constitutional:  Negative for chills and fever.  Respiratory:  Positive for shortness of breath. Negative for cough.   Cardiovascular:  Negative for chest pain.  Gastrointestinal:  Negative for abdominal pain.  Genitourinary:  Negative for dysuria.  Neurological:  Positive for weakness and numbness. Negative for headaches.     Objective:    Physical Exam Vitals reviewed.  Constitutional:      Appearance: Normal appearance.  Cardiovascular:     Rate and Rhythm: Normal rate.     Comments: Irregular rhythm but rate controlled Pulmonary:     Effort: Pulmonary effort is normal.     Breath sounds: Normal breath sounds.  Musculoskeletal:     Comments: she has support stockings on bilaterally  Neurological:     General: No focal deficit present.     Mental Status: She is alert.    BP 124/80 (BP Location: Left Arm, Patient Position: Sitting, Cuff Size: Normal)   Pulse 76   Temp 97.6 F (36.4 C) (Oral)   Wt 157 lb 4.8 oz (71.4 kg)   SpO2 96%   BMI 25.78 kg/m  Wt Readings from Last 3 Encounters:  08/31/20 157 lb 4.8 oz (71.4 kg)  03/18/20 158 lb 9.6 oz (71.9 kg)  02/05/20 153 lb (69.4 kg)     Health Maintenance Due  Topic Date Due   COVID-19 Vaccine (1) Never done   Zoster Vaccines- Shingrix (1 of 2) Never done   TETANUS/TDAP  01/08/2013   INFLUENZA VACCINE  08/08/2020    There are no preventive care reminders to display for this patient.  Lab Results  Component Value Date   TSH 3.93 02/01/2016   Lab Results  Component Value Date   WBC 6.1 08/29/2018   HGB 13.6 08/29/2018   HCT 40.3 08/29/2018   MCV 99.3  08/29/2018   PLT 123.0 Repeated and verified X2. (L) 08/29/2018   Lab Results  Component Value Date   NA 142 03/18/2020   K 3.7 03/18/2020   CO2 29 03/18/2020   GLUCOSE 87 03/18/2020   BUN 15 03/18/2020   CREATININE 0.90 03/18/2020   BILITOT 1.6 (H) 06/29/2019   ALKPHOS 69 06/29/2019   AST 26 06/29/2019   ALT 19 06/29/2019   PROT 6.2 06/29/2019   ALBUMIN 4.0 06/29/2019   CALCIUM 9.1 03/18/2020   GFR 55.92 (L) 03/18/2020   Lab Results  Component Value Date   CHOL 131 06/29/2019   Lab Results  Component Value Date   HDL 56.20 06/29/2019   Lab Results  Component Value Date   LDLCALC 53 06/29/2019   Lab Results  Component Value Date   TRIG 107.0 06/29/2019   Lab Results  Component Value Date   CHOLHDL 2 06/29/2019   Lab Results  Component Value Date   HGBA1C 5.8 08/06/2016      Assessment & Plan:   #1 history of polyneuropathy with high risk for falls.  Set up home health PT and OT especially to focus on strengthening exercises for lower extremity to reduce fall risk.  #2 history of recurrent candidal rash under both breast -Leave bra off is much as possible and keep area dry -Refill nystatin cream to use as needed twice daily  #3 history of atrial fibrillation and chronic systolic heart failure currently stable. -Continue metoprolol and Coumadin as well as furosemide. -Continue potassium supplementation.  Recheck electrolytes next visit.  #4 chronic Coumadin therapy.  Patient followed through Coumadin clinic and continue follow-up with them as directed.   Meds ordered this encounter  Medications   nystatin cream (MYCOSTATIN)    Sig: Apply 1 application topically 2 (two) times  daily.    Dispense:  30 g    Refill:  2    Follow-up: Return in about 6 months (around 03/03/2021).    Evelena Peat, MD

## 2020-09-04 ENCOUNTER — Emergency Department (HOSPITAL_COMMUNITY): Payer: Medicare Other

## 2020-09-04 ENCOUNTER — Encounter (HOSPITAL_COMMUNITY): Payer: Self-pay | Admitting: Internal Medicine

## 2020-09-04 ENCOUNTER — Inpatient Hospital Stay (HOSPITAL_COMMUNITY)
Admission: EM | Admit: 2020-09-04 | Discharge: 2020-09-08 | DRG: 521 | Disposition: A | Discharge status: Skilled Nursing Facility | Payer: Medicare Other | Attending: Internal Medicine | Admitting: Internal Medicine

## 2020-09-04 DIAGNOSIS — I5022 Chronic systolic (congestive) heart failure: Secondary | ICD-10-CM | POA: Diagnosis present

## 2020-09-04 DIAGNOSIS — N2889 Other specified disorders of kidney and ureter: Secondary | ICD-10-CM | POA: Diagnosis present

## 2020-09-04 DIAGNOSIS — W19XXXA Unspecified fall, initial encounter: Principal | ICD-10-CM

## 2020-09-04 DIAGNOSIS — S72002A Fracture of unspecified part of neck of left femur, initial encounter for closed fracture: Secondary | ICD-10-CM

## 2020-09-04 DIAGNOSIS — I4821 Permanent atrial fibrillation: Secondary | ICD-10-CM | POA: Diagnosis present

## 2020-09-04 DIAGNOSIS — I2722 Pulmonary hypertension due to left heart disease: Secondary | ICD-10-CM | POA: Diagnosis not present

## 2020-09-04 DIAGNOSIS — R519 Headache, unspecified: Secondary | ICD-10-CM | POA: Diagnosis not present

## 2020-09-04 DIAGNOSIS — N183 Chronic kidney disease, stage 3 unspecified: Secondary | ICD-10-CM | POA: Diagnosis not present

## 2020-09-04 DIAGNOSIS — R0902 Hypoxemia: Secondary | ICD-10-CM | POA: Diagnosis not present

## 2020-09-04 DIAGNOSIS — Z96642 Presence of left artificial hip joint: Secondary | ICD-10-CM | POA: Diagnosis not present

## 2020-09-04 DIAGNOSIS — Z885 Allergy status to narcotic agent status: Secondary | ICD-10-CM

## 2020-09-04 DIAGNOSIS — Z7901 Long term (current) use of anticoagulants: Secondary | ICD-10-CM | POA: Diagnosis not present

## 2020-09-04 DIAGNOSIS — Z66 Do not resuscitate: Secondary | ICD-10-CM | POA: Diagnosis not present

## 2020-09-04 DIAGNOSIS — I110 Hypertensive heart disease with heart failure: Secondary | ICD-10-CM | POA: Diagnosis not present

## 2020-09-04 DIAGNOSIS — Z743 Need for continuous supervision: Secondary | ICD-10-CM | POA: Diagnosis not present

## 2020-09-04 DIAGNOSIS — G629 Polyneuropathy, unspecified: Secondary | ICD-10-CM | POA: Diagnosis not present

## 2020-09-04 DIAGNOSIS — R531 Weakness: Secondary | ICD-10-CM | POA: Diagnosis not present

## 2020-09-04 DIAGNOSIS — R296 Repeated falls: Secondary | ICD-10-CM | POA: Diagnosis not present

## 2020-09-04 DIAGNOSIS — I10 Essential (primary) hypertension: Secondary | ICD-10-CM

## 2020-09-04 DIAGNOSIS — Z79899 Other long term (current) drug therapy: Secondary | ICD-10-CM

## 2020-09-04 DIAGNOSIS — S0990XA Unspecified injury of head, initial encounter: Secondary | ICD-10-CM | POA: Diagnosis not present

## 2020-09-04 DIAGNOSIS — Z20822 Contact with and (suspected) exposure to covid-19: Secondary | ICD-10-CM | POA: Diagnosis not present

## 2020-09-04 DIAGNOSIS — J9811 Atelectasis: Secondary | ICD-10-CM | POA: Diagnosis not present

## 2020-09-04 DIAGNOSIS — I4891 Unspecified atrial fibrillation: Secondary | ICD-10-CM | POA: Diagnosis not present

## 2020-09-04 DIAGNOSIS — M50223 Other cervical disc displacement at C6-C7 level: Secondary | ICD-10-CM | POA: Diagnosis not present

## 2020-09-04 DIAGNOSIS — Z880 Allergy status to penicillin: Secondary | ICD-10-CM

## 2020-09-04 DIAGNOSIS — E538 Deficiency of other specified B group vitamins: Secondary | ICD-10-CM | POA: Diagnosis not present

## 2020-09-04 DIAGNOSIS — Z91041 Radiographic dye allergy status: Secondary | ICD-10-CM

## 2020-09-04 DIAGNOSIS — I517 Cardiomegaly: Secondary | ICD-10-CM | POA: Diagnosis not present

## 2020-09-04 DIAGNOSIS — Z96649 Presence of unspecified artificial hip joint: Secondary | ICD-10-CM

## 2020-09-04 DIAGNOSIS — M7989 Other specified soft tissue disorders: Secondary | ICD-10-CM | POA: Diagnosis not present

## 2020-09-04 DIAGNOSIS — R0689 Other abnormalities of breathing: Secondary | ICD-10-CM | POA: Diagnosis not present

## 2020-09-04 DIAGNOSIS — Z9181 History of falling: Secondary | ICD-10-CM | POA: Diagnosis not present

## 2020-09-04 DIAGNOSIS — M50221 Other cervical disc displacement at C4-C5 level: Secondary | ICD-10-CM | POA: Diagnosis present

## 2020-09-04 DIAGNOSIS — Z882 Allergy status to sulfonamides status: Secondary | ICD-10-CM

## 2020-09-04 DIAGNOSIS — W1830XA Fall on same level, unspecified, initial encounter: Secondary | ICD-10-CM | POA: Diagnosis present

## 2020-09-04 DIAGNOSIS — R6889 Other general symptoms and signs: Secondary | ICD-10-CM | POA: Diagnosis not present

## 2020-09-04 DIAGNOSIS — Y92019 Unspecified place in single-family (private) house as the place of occurrence of the external cause: Secondary | ICD-10-CM

## 2020-09-04 DIAGNOSIS — Z043 Encounter for examination and observation following other accident: Secondary | ICD-10-CM | POA: Diagnosis not present

## 2020-09-04 DIAGNOSIS — Z741 Need for assistance with personal care: Secondary | ICD-10-CM | POA: Diagnosis not present

## 2020-09-04 DIAGNOSIS — M549 Dorsalgia, unspecified: Secondary | ICD-10-CM | POA: Diagnosis not present

## 2020-09-04 DIAGNOSIS — M6281 Muscle weakness (generalized): Secondary | ICD-10-CM | POA: Diagnosis not present

## 2020-09-04 DIAGNOSIS — H8110 Benign paroxysmal vertigo, unspecified ear: Secondary | ICD-10-CM | POA: Diagnosis present

## 2020-09-04 DIAGNOSIS — Z888 Allergy status to other drugs, medicaments and biological substances status: Secondary | ICD-10-CM

## 2020-09-04 DIAGNOSIS — R0602 Shortness of breath: Secondary | ICD-10-CM | POA: Diagnosis not present

## 2020-09-04 DIAGNOSIS — J9601 Acute respiratory failure with hypoxia: Secondary | ICD-10-CM | POA: Diagnosis present

## 2020-09-04 DIAGNOSIS — R2681 Unsteadiness on feet: Secondary | ICD-10-CM | POA: Diagnosis not present

## 2020-09-04 DIAGNOSIS — Z471 Aftercare following joint replacement surgery: Secondary | ICD-10-CM | POA: Diagnosis not present

## 2020-09-04 DIAGNOSIS — N9989 Other postprocedural complications and disorders of genitourinary system: Secondary | ICD-10-CM | POA: Diagnosis not present

## 2020-09-04 DIAGNOSIS — S7292XA Unspecified fracture of left femur, initial encounter for closed fracture: Secondary | ICD-10-CM | POA: Diagnosis not present

## 2020-09-04 DIAGNOSIS — R338 Other retention of urine: Secondary | ICD-10-CM | POA: Diagnosis not present

## 2020-09-04 DIAGNOSIS — Z7401 Bed confinement status: Secondary | ICD-10-CM | POA: Diagnosis not present

## 2020-09-04 DIAGNOSIS — M4802 Spinal stenosis, cervical region: Secondary | ICD-10-CM | POA: Diagnosis not present

## 2020-09-04 DIAGNOSIS — J811 Chronic pulmonary edema: Secondary | ICD-10-CM | POA: Diagnosis not present

## 2020-09-04 DIAGNOSIS — S72002D Fracture of unspecified part of neck of left femur, subsequent encounter for closed fracture with routine healing: Secondary | ICD-10-CM | POA: Diagnosis not present

## 2020-09-04 DIAGNOSIS — R1312 Dysphagia, oropharyngeal phase: Secondary | ICD-10-CM | POA: Diagnosis not present

## 2020-09-04 DIAGNOSIS — R5383 Other fatigue: Secondary | ICD-10-CM | POA: Diagnosis not present

## 2020-09-04 DIAGNOSIS — Z881 Allergy status to other antibiotic agents status: Secondary | ICD-10-CM

## 2020-09-04 HISTORY — DX: Essential (primary) hypertension: I10

## 2020-09-04 HISTORY — DX: Deficiency of other specified B group vitamins: E53.8

## 2020-09-04 HISTORY — DX: Polyneuropathy, unspecified: G62.9

## 2020-09-04 HISTORY — DX: Permanent atrial fibrillation: I48.21

## 2020-09-04 LAB — SAMPLE TO BLOOD BANK

## 2020-09-04 LAB — CBC WITH DIFFERENTIAL/PLATELET
Abs Immature Granulocytes: 0.04 10*3/uL (ref 0.00–0.07)
Basophils Absolute: 0 10*3/uL (ref 0.0–0.1)
Basophils Relative: 0 %
Eosinophils Absolute: 0 10*3/uL (ref 0.0–0.5)
Eosinophils Relative: 0 %
HCT: 46 % (ref 36.0–46.0)
Hemoglobin: 14.6 g/dL (ref 12.0–15.0)
Immature Granulocytes: 0 %
Lymphocytes Relative: 6 %
Lymphs Abs: 0.6 10*3/uL — ABNORMAL LOW (ref 0.7–4.0)
MCH: 32.8 pg (ref 26.0–34.0)
MCHC: 31.7 g/dL (ref 30.0–36.0)
MCV: 103.4 fL — ABNORMAL HIGH (ref 80.0–100.0)
Monocytes Absolute: 0.7 10*3/uL (ref 0.1–1.0)
Monocytes Relative: 7 %
Neutro Abs: 8 10*3/uL — ABNORMAL HIGH (ref 1.7–7.7)
Neutrophils Relative %: 87 %
Platelets: 101 10*3/uL — ABNORMAL LOW (ref 150–400)
RBC: 4.45 MIL/uL (ref 3.87–5.11)
RDW: 13.6 % (ref 11.5–15.5)
WBC: 9.4 10*3/uL (ref 4.0–10.5)
nRBC: 0 % (ref 0.0–0.2)

## 2020-09-04 LAB — COMPREHENSIVE METABOLIC PANEL
ALT: 43 U/L (ref 0–44)
AST: 47 U/L — ABNORMAL HIGH (ref 15–41)
Albumin: 3.8 g/dL (ref 3.5–5.0)
Alkaline Phosphatase: 127 U/L — ABNORMAL HIGH (ref 38–126)
Anion gap: 11 (ref 5–15)
BUN: 24 mg/dL — ABNORMAL HIGH (ref 8–23)
CO2: 25 mmol/L (ref 22–32)
Calcium: 9.2 mg/dL (ref 8.9–10.3)
Chloride: 103 mmol/L (ref 98–111)
Creatinine, Ser: 1.02 mg/dL — ABNORMAL HIGH (ref 0.44–1.00)
GFR, Estimated: 52 mL/min — ABNORMAL LOW (ref >60)
Glucose, Bld: 166 mg/dL — ABNORMAL HIGH (ref 70–99)
Potassium: 4 mmol/L (ref 3.5–5.1)
Sodium: 139 mmol/L (ref 135–145)
Total Bilirubin: 2.2 mg/dL — ABNORMAL HIGH (ref 0.3–1.2)
Total Protein: 7.1 g/dL (ref 6.5–8.1)

## 2020-09-04 LAB — PROTIME-INR
INR: 1.3 — ABNORMAL HIGH (ref 0.8–1.2)
Prothrombin Time: 16.6 seconds — ABNORMAL HIGH (ref 11.4–15.2)

## 2020-09-04 LAB — URINALYSIS, ROUTINE W REFLEX MICROSCOPIC
Bilirubin Urine: NEGATIVE
Glucose, UA: NEGATIVE mg/dL
Hgb urine dipstick: NEGATIVE
Ketones, ur: NEGATIVE mg/dL
Leukocytes,Ua: NEGATIVE
Nitrite: NEGATIVE
Protein, ur: NEGATIVE mg/dL
Specific Gravity, Urine: 1.015 (ref 1.005–1.030)
pH: 5 (ref 5.0–8.0)

## 2020-09-04 LAB — RESP PANEL BY RT-PCR (FLU A&B, COVID) ARPGX2
Influenza A by PCR: NEGATIVE
Influenza B by PCR: NEGATIVE
SARS Coronavirus 2 by RT PCR: NEGATIVE

## 2020-09-04 LAB — CK: Total CK: 155 U/L (ref 38–234)

## 2020-09-04 MED ORDER — ONDANSETRON HCL 4 MG/2ML IJ SOLN
4.0000 mg | Freq: Four times a day (QID) | INTRAMUSCULAR | Status: DC | PRN
  Administered 2020-09-05: 4 mg via INTRAVENOUS
  Filled 2020-09-04: qty 2

## 2020-09-04 MED ORDER — ALBUTEROL SULFATE (2.5 MG/3ML) 0.083% IN NEBU
2.5000 mg | INHALATION_SOLUTION | Freq: Once | RESPIRATORY_TRACT | Status: AC
  Administered 2020-09-04: 2.5 mg via RESPIRATORY_TRACT
  Filled 2020-09-04: qty 3

## 2020-09-04 MED ORDER — ACETAMINOPHEN 325 MG PO TABS: 650.0000 mg | ORAL_TABLET | ORAL | Status: DC | PRN

## 2020-09-04 MED ORDER — FENTANYL CITRATE PF 50 MCG/ML IJ SOSY
50.0000 ug | PREFILLED_SYRINGE | Freq: Once | INTRAMUSCULAR | Status: AC
  Administered 2020-09-04: 50 ug via INTRAVENOUS
  Filled 2020-09-04: qty 1

## 2020-09-04 MED ORDER — FENTANYL CITRATE PF 50 MCG/ML IJ SOSY
12.5000 ug | PREFILLED_SYRINGE | INTRAMUSCULAR | Status: DC | PRN
Start: 2020-09-04 — End: 2020-09-05

## 2020-09-04 MED ORDER — FENTANYL CITRATE PF 50 MCG/ML IJ SOSY
25.0000 ug | PREFILLED_SYRINGE | INTRAMUSCULAR | Status: DC | PRN
  Administered 2020-09-05 (×3): 25 ug via INTRAVENOUS
  Filled 2020-09-04 (×3): qty 1

## 2020-09-04 MED ORDER — METOPROLOL TARTRATE 100 MG PO TABS: 100.0000 mg | ORAL_TABLET | Freq: Two times a day (BID) | ORAL | Status: DC

## 2020-09-04 MED ORDER — POLYETHYLENE GLYCOL 3350 17 G PO PACK: 17.0000 g | PACK | Freq: Every day | ORAL | Status: DC | PRN

## 2020-09-04 MED ORDER — ALBUTEROL SULFATE (2.5 MG/3ML) 0.083% IN NEBU: 2.5000 mg | INHALATION_SOLUTION | RESPIRATORY_TRACT | Status: DC | PRN

## 2020-09-04 MED ORDER — ONDANSETRON HCL 4 MG/2ML IJ SOLN
4.0000 mg | Freq: Once | INTRAMUSCULAR | Status: AC
  Administered 2020-09-04: 4 mg via INTRAVENOUS
  Filled 2020-09-04: qty 2

## 2020-09-04 NOTE — H&P (Signed)
History and Physical    [name] KDT:[id number] DOB: 06-06-1926 DOA: 09/04/2020  PCP: [name], MD  Patient coming from: Home via EMS   Chief Complaint:  Chief Complaint  Patient presents with   Fall     HPI:    87 year old female with Paschal history of systolic congestive heart failure (Echo 03/2012 EF 20%), vitamin B12 deficiency , permanent atrial fibrillation (on coumadin), hypertension, benign positional vertigo who presents to [facility] emergency department via EMS after being found status post fall by her son.  History is been obtained from both the patient as well as the son who is at bedside  Son explains that he last saw his mother early in the morning on 8/27.  Son explains that when he attempted to contact his mother later that morning she did not answer, and concerned that something had happened.  The patient estimates that she fell around 8:30 AM.  The son had not heard from his mother for several hours he eventually came to the home approximately 5:30 PM and found that his mother had fallen now complaining of severe pain of the left lower extremity and left hip.  Patient describes the pain as severe in intensity, sharp in quality, radiating distally and worse with movement of the affected extremity lasting for several hours now.  Pain 0 is somewhat improved after a dose of fentanyl has given to the patient upon arrival to the emergency department.  Upon further questioning patient denies loss of consciousness preceding the fall.  Patient denies any tongue biting self urination or self defecation.  Of note, both son and patient is reported that she has developed progressively worsening bilateral lower extremity weakness over the past several weeks to months.  It is worth mentioning that patient's primary care provider note on 8/24 specifically mentions that patient's has had progressively worsening bilateral lower extremity weakness with the  provider's concern that the patient is an extremely high fall risk for falls.  Patient denies any urinary or fecal incontinence  Upon evaluation in the emergency department, CT imaging the head revealed no evidence of intracranial hemorrhage.  CT imaging of the cervical revealed large central disc protrusion at C4-C5 being about concern for central cord compression.  Continue the lumbar spine revealed no evidence of fracture but did reveal an incidental finding of a 3 cm lesion of the right kidney with concern for renal cell carcinoma finally, left hip x-ray revealed a left femoral neck fracture.  ER provider discussed case with Dr. Charlann Boxer with orthopedic surgery who recommended the patient remain n.p.o. after midnight with him to evaluate the patient in the morning..  The hospitalist group was then called to assess the patient for admission to the hospital  Review of Systems:   ROS  Past Medical History:  Diagnosis Date   Essential hypertension 09/04/2020   Peripheral polyneuropathy 09/04/2020   Permanent atrial fibrillation (HCC) 09/04/2020   Vitamin B12 deficiency 09/04/2020    History reviewed. No pertinent surgical history.   reports that she has never smoked. She has never used smokeless tobacco. She reports that she does not use drugs. No history on file for alcohol use.  Not on File  Family History  Problem Relation Age of Onset   Heart disease Neg Hx      Prior to Admission medications   Not on File    Physical Exam: Vitals:   09/04/20 1820 09/04/20 1858 09/04/20 2030 09/04/20 2215  BP:   Marland Kitchen)  129/99 130/89  Pulse:   (!) 101 93  Resp:   (!) 22 (!) 23  Temp:      TempSrc:      SpO2: 95%  96% 96%  Weight:  71.7 kg    Height:   (1.676 m)      Constitutional: Lethargic but arousable, oriented x3, no associated distress.   Skin: no rashes, no lesions, somewhat poor skin turgor noted Eyes: Pupils are equally reactive to light.  No evidence of scleral icterus or  conjunctival pallor.  ENMT: Extremely mucous membranes noted.  Posterior pharynx clear of any exudate or lesions.   Neck: normal, supple, no masses, no thyromegaly.  No evidence of jugular venous distension.   Respiratory: Faint bibasilar rales with intermittent expiratory wheezing heard.  Normal respiratory effort. No accessory muscle use.  Cardiovascular: Irregularly irregular rate and rhythm, no murmurs / rubs / gallops.  +1 pitting edema of the distal bilateral lower extremities, 2+ pedal pulses. No carotid bruits.  Chest:   Nontender without crepitus or deformity.   Back:   Nontender without crepitus or deformity. Abdomen: Abdomen is soft and nontender.  No evidence of intra-abdominal masses.  Positive bowel sounds noted in all quadrants.   Musculoskeletal: Exquisite tenderness of the left hip with both passive and active range of motion.  Notable externally rotated left lower extremity.   no contractures. Normal muscle tone.  Neurologic: CN 2-12 grossly intact. Sensation intact.  Patient moving all 4 extremities spontaneously although strength is limited of the left lower extremity due to severe pain.  Patient is following all commands.  Patient is responsive to verbal stimuli.   Psychiatric: Patient exhibits normal mood with appropriate affect.  Patient seems to possess insight as to their current situation.     Labs on Admission: I have personally reviewed following labs and imaging studies -   CBC: Recent Labs  Lab 09/04/20 1820  WBC 9.4  NEUTROABS 8.0*  HGB 14.6  HCT 46.0  MCV 103.4*  PLT 101*   Basic Metabolic Panel: Recent Labs  Lab 09/04/20 1820  NA 139  K 4.0  CL 103  CO2 25  GLUCOSE 166*  BUN 24*  CREATININE 1.02*  CALCIUM 9.2   GFR: Estimated Creatinine Clearance: 36.5 mL/min (A) (by C-G formula based on SCr of 1.02 mg/dL (H)). Liver Function Tests: Recent Labs  Lab 09/04/20 1820  AST 47*  ALT 43  ALKPHOS 127*  BILITOT 2.2*  PROT 7.1  ALBUMIN 3.8    No results for input(s): LIPASE, AMYLASE in the last 168 hours. No results for input(s): AMMONIA in the last 168 hours. Coagulation Profile: Recent Labs  Lab 09/04/20 1820  INR 1.3*   Cardiac Enzymes: Recent Labs  Lab 09/04/20 1820  CKTOTAL 155   BNP (last 3 results) No results for input(s): PROBNP in the last 8760 hours. HbA1C: No results for input(s): HGBA1C in the last 72 hours. CBG: No results for input(s): GLUCAP in the last 168 hours. Lipid Profile: No results for input(s): CHOL, HDL, LDLCALC, TRIG, CHOLHDL, LDLDIRECT in the last 72 hours. Thyroid Function Tests: No results for input(s): TSH, T4TOTAL, FREET4, T3FREE, THYROIDAB in the last 72 hours. Anemia Panel: No results for input(s): VITAMINB12, FOLATE, FERRITIN, TIBC, IRON, RETICCTPCT in the last 72 hours. Urine analysis:    Component Value Date/Time   COLORURINE AMBER (A) 09/04/2020 2223   APPEARANCEUR HAZY (A) 09/04/2020 2223   LABSPEC 1.015 09/04/2020 2223   PHURINE 5.0 09/04/2020 2223  GLUCOSEU NEGATIVE 09/04/2020 2223   HGBUR NEGATIVE 09/04/2020 2223   BILIRUBINUR NEGATIVE 09/04/2020 2223   KETONESUR NEGATIVE 09/04/2020 2223   PROTEINUR NEGATIVE 09/04/2020 2223   NITRITE NEGATIVE 09/04/2020 2223   LEUKOCYTESUR NEGATIVE 09/04/2020 2223    Radiological Exams on Admission - Personally Reviewed: DG Wrist Complete Right  Result Date: 09/04/2020 CLINICAL DATA:  Status post fall. EXAM: RIGHT WRIST - COMPLETE 3+ VIEW COMPARISON:  None. FINDINGS: Chronic fracture deformities are seen involving the distal right radius and right ulnar styloid. There is no evidence of dislocation. Diffuse osteopenia is seen. Degenerative changes are noted throughout the right wrist. There is diffuse soft tissue swelling. IMPRESSION: Chronic fracture deformities of the distal right radius and right ulnar styloid. Electronically Signed   By: Aram Candelahaddeus  Houston M.D.   On: 09/04/2020 21:45   CT HEAD WO CONTRAST (5MM)  Result Date:  09/04/2020 CLINICAL DATA:  Fall EXAM: CT HEAD WITHOUT CONTRAST CT CERVICAL SPINE WITHOUT CONTRAST TECHNIQUE: Multidetector CT imaging of the head and cervical spine was performed following the standard protocol without intravenous contrast. Multiplanar CT image reconstructions of the cervical spine were also generated. COMPARISON:  None. FINDINGS: CT HEAD FINDINGS Brain: There is no mass, hemorrhage or extra-axial collection. There is generalized atrophy without lobar predilection. There is hypoattenuation of the periventricular white matter, most commonly indicating chronic ischemic microangiopathy. Vascular: No abnormal hyperdensity of the major intracranial arteries or dural venous sinuses. No intracranial atherosclerosis. Skull: The visualized skull base, calvarium and extracranial soft tissues are normal. Sinuses/Orbits: No fluid levels or advanced mucosal thickening of the visualized paranasal sinuses. No mastoid or middle ear effusion. The orbits are normal. CT CERVICAL SPINE FINDINGS Alignment: No static subluxation. Facets are aligned. Occipital condyles are normally positioned. Skull base and vertebrae: No acute fracture. Soft tissues and spinal canal: No prevertebral fluid or swelling. No visible canal hematoma. Disc levels: Large central disc protrusion at C4-5 indenting the ventral spinal cord. Upper chest: No pneumothorax, pulmonary nodule or pleural effusion. Other: Normal visualized paraspinal cervical soft tissues. IMPRESSION: 1. Chronic ischemic microangiopathy and generalized atrophy without acute intracranial abnormality. 2. No acute fracture or static subluxation of the cervical spine. 3. Large central disc protrusion at C4-5 indenting the ventral spinal cord. Electronically Signed   By: Deatra RobinsonKevin  Herman M.D.   On: 09/04/2020 20:03   CT Cervical Spine Wo Contrast  Result Date: 09/04/2020 CLINICAL DATA:  Fall EXAM: CT HEAD WITHOUT CONTRAST CT CERVICAL SPINE WITHOUT CONTRAST TECHNIQUE:  Multidetector CT imaging of the head and cervical spine was performed following the standard protocol without intravenous contrast. Multiplanar CT image reconstructions of the cervical spine were also generated. COMPARISON:  None. FINDINGS: CT HEAD FINDINGS Brain: There is no mass, hemorrhage or extra-axial collection. There is generalized atrophy without lobar predilection. There is hypoattenuation of the periventricular white matter, most commonly indicating chronic ischemic microangiopathy. Vascular: No abnormal hyperdensity of the major intracranial arteries or dural venous sinuses. No intracranial atherosclerosis. Skull: The visualized skull base, calvarium and extracranial soft tissues are normal. Sinuses/Orbits: No fluid levels or advanced mucosal thickening of the visualized paranasal sinuses. No mastoid or middle ear effusion. The orbits are normal. CT CERVICAL SPINE FINDINGS Alignment: No static subluxation. Facets are aligned. Occipital condyles are normally positioned. Skull base and vertebrae: No acute fracture. Soft tissues and spinal canal: No prevertebral fluid or swelling. No visible canal hematoma. Disc levels: Large central disc protrusion at C4-5 indenting the ventral spinal cord. Upper chest: No pneumothorax, pulmonary nodule or  pleural effusion. Other: Normal visualized paraspinal cervical soft tissues. IMPRESSION: 1. Chronic ischemic microangiopathy and generalized atrophy without acute intracranial abnormality. 2. No acute fracture or static subluxation of the cervical spine. 3. Large central disc protrusion at C4-5 indenting the ventral spinal cord. Electronically Signed   By: Deatra Robinson M.D.   On: 09/04/2020 20:03   CT Lumbar Spine Wo Contrast  Result Date: 09/04/2020 CLINICAL DATA:  Fall EXAM: CT LUMBAR SPINE WITHOUT CONTRAST TECHNIQUE: Multidetector CT imaging of the lumbar spine was performed without intravenous contrast administration. Multiplanar CT image reconstructions were  also generated. COMPARISON:  None. FINDINGS: Segmentation: 5 lumbar type vertebrae. Alignment: Normal. Vertebrae: No acute fracture or focal pathologic process. Paraspinal and other soft tissues: Calcific aortic atherosclerosis. Intermediate density right renal exophytic lesion measuring 3.0 cm. Disc levels: Multilevel degenerative disc disease without spinal canal or neural foraminal stenosis. IMPRESSION: 1. No acute fracture or static subluxation of the lumbar spine. 2. Multilevel degenerative disc disease without spinal canal or neural foraminal stenosis. 3. Intermediate density right renal exophytic lesion measuring 3.0 cm. Multiphase renal mass protocol CT or MRI of the abdomen with and without contrast is recommended for complete characterization to exclude renal carcinoma and could be obtained non-emergently. Aortic Atherosclerosis (ICD10-I70.0). Electronically Signed   By: Deatra Robinson M.D.   On: 09/04/2020 19:59   DG Pelvis Portable  Result Date: 09/04/2020 CLINICAL DATA:  Status post fall EXAM: PORTABLE PELVIS 1-2 VIEWS; DG HIP (WITH OR WITHOUT PELVIS) 2-3V LEFT COMPARISON:  None. FINDINGS: Left femoral neck fracture. No left hip dislocation. Frontal view of the right hip with no definite displaced fracture. No acute displaced fracture or diastasis of the bones of the pelvis. Degenerative changes of the lumbar spine. Limited evaluation of the sacrum due to overlying bowel gas. IMPRESSION: 1. Left femoral neck fracture. 2. No left hip dislocation. 3. No acute displaced fracture or dislocation of the right hip on frontal view. 4. No acute displaced fracture or diastasis of the bones of the pelvis. Electronically Signed   By: Tish Frederickson M.D.   On: 09/04/2020 19:28   DG Chest Portable 1 View  Result Date: 09/04/2020 CLINICAL DATA:  Fall. EXAM: PORTABLE CHEST 1 VIEW COMPARISON:  February 02, 2011 FINDINGS: No pneumothorax. Stable cardiomegaly. The hila and mediastinum are unremarkable. Kerley  B-lines are identified in the periphery of the left lung suggesting mild edema. No suspicious nodule, mass, or focal infiltrate. IMPRESSION: Cardiomegaly and probable mild pulmonary edema. No other acute abnormalities are identified. Electronically Signed   By: Gerome Sam III M.D.   On: 09/04/2020 19:28   DG Hip Unilat W or Wo Pelvis 2-3 Views Left  Result Date: 09/04/2020 CLINICAL DATA:  Status post fall EXAM: PORTABLE PELVIS 1-2 VIEWS; DG HIP (WITH OR WITHOUT PELVIS) 2-3V LEFT COMPARISON:  None. FINDINGS: Left femoral neck fracture. No left hip dislocation. Frontal view of the right hip with no definite displaced fracture. No acute displaced fracture or diastasis of the bones of the pelvis. Degenerative changes of the lumbar spine. Limited evaluation of the sacrum due to overlying bowel gas. IMPRESSION: 1. Left femoral neck fracture. 2. No left hip dislocation. 3. No acute displaced fracture or dislocation of the right hip on frontal view. 4. No acute displaced fracture or diastasis of the bones of the pelvis. Electronically Signed   By: Tish Frederickson M.D.   On: 09/04/2020 19:28    EKG: Personally reviewed.  Rhythm is atrial fibrillation with heart rate of 99  bpm.  No dynamic ST segment changes appreciated.  Assessment/Plan Principal Problem:   Fracture of femoral neck, left, closed (HCC)  Patient is suffering from severe left hip and left lower extremity pain status post fall resulting in left femoral neck fracture Dr. Charlann Boxer with orthopedic surgery has been notified by the emergency department provider who is very be n.p.o. 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 No obvious evidence of focal weakness or problems with sensation on examination however this has been limited due to pain from the hip fracture.  No reports of fecal or urinary incontinence. We will start by obtaining a MRI of the cervical spine with contrast to better evaluate the degree of cord compression and once hip is stabilized by orthopedic surgery will then consult neurosurgery to evaluate whether this cord compression is causative and whether he should therefore be addressed  Permanent atrial fibrillation [facility])  Patient has been holding her usual regimen of Coumadin for the past several days in preparation for dental procedure Will continue to hold Coumadin therapy until all potential interventions are complete Monitoring patient on telemetry .  Rate is controlled Continue home regimen of metoprolol 100 mg twice daily    Acute urinary retention  Patient was noted to have greater than 550 cc in the bladder with patient reporting inability to urinate throughout her emergency department stay  Emergency department staff placed Foley catheter due to anticipation the patient will go to operating room in the morning anyway.  An order has been placed for this Foley catheter can then be removed postoperatively for voiding trial    Acute respiratory failure with hypoxia (HCC)  Patient exhibiting hypoxia on arrival requiring supplemental oxygen via nasal cannula Etiology not entirely  clear, question mild pulmonary edema on chest x-ray Will obtain BNP, CRP and procalcitonin.  Additionally ordered echocardiogram for the morning  if BNP is markedly elevated will consider trial dosing of Lasix If CRP procalcitonin markedly elevated, will consider this may be atypical infectious process on chest x-ray. Pulmonary embolism unlikely due to the fact the patient has been on longstanding Coumadin therapy In the meantime, will provide patient with bronchodilator therapy for mild wheezing    Essential hypertension  Continue home regimen Toprol    Right renal mass  Incidental finding of 3 cm right renal lesion Will consider proceeding with CT renal protocol or MRI of abdomen once hip is stabilized. Proceeding with both MRI of the cervical spine as well as of the abdomen with the patient still having a broken hip would prove extremely uncomfortable for the patient. Then based on MRI findings consideration of urology consultation can be undertaken if RCC is confirmed    Chronic systolic CHF (congestive heart failure) (HCC)  Question possible mild pulmonary edema on chest x-ray, although patient appears clinically dry Awaiting BNP Echocardiogram in the morning    Code Status:  DNR Family Communication: Son is at bedside who has been updated on plan of care  Status is: Inpatient  Remains inpatient appropriate because:Ongoing diagnostic testing needed not appropriate for outpatient work up, IV treatments appropriate due to intensity of illness or inability to take PO, and Inpatient level of care appropriate due to severity of illness  Dispo: The patient is from: Home              Anticipated d/c is to: Home              Patient currently is not medically stable to d/c.   Difficult to place patient No        Marinda Elk MD [facility] Pager [phone]  If 7PM-7AM, please contact night-coverage [email or web address] Use universal [place] password for that  web site. If you do not have the password, please call the hospital operator.  09/04/2020, 11:07 PM

## 2020-09-04 NOTE — ED Notes (Signed)
Ice water give to pt's visitor.

## 2020-09-04 NOTE — ED Notes (Signed)
Patient transported to X-ray 

## 2020-09-04 NOTE — Progress Notes (Signed)
Orthopedic Tech Progress Note Patient Details:  [name] 1926-11-14 [id number]  Level 2 Trauma Patient ID: [name], female   DOB: [birth year], 87 y.o.   MRN: [id number]  Docia Furl 09/04/2020, 8:06 PM

## 2020-09-04 NOTE — ED Triage Notes (Signed)
Pt fell at ~0800 and was found at 1730 by son. Pt is on thinners and hit her head. Pt does not report LOC, but does c/o pain to back of head and lower back.

## 2020-09-04 NOTE — ED Notes (Signed)
Placed pt on purewick  

## 2020-09-04 NOTE — ED Notes (Signed)
Pt placed on RA. O2 sat dropped as low as 78%. 2l placed back on pt. MD notified.

## 2020-09-04 NOTE — Progress Notes (Signed)
Patient ID: [name], female   DOB: 05-11-1926, 87 y.o.   MRN: [id number]  Consult received for left hip femoral neck fracture  Full note to follow Will need hemiarthroplasty for definitive management Timing TBD NPO for now in case able to get addressed tomorrow Hold DVT chemprophylaxis

## 2020-09-04 NOTE — ED Provider Notes (Signed)
[facility] EMERGENCY DEPARTMENT Provider Note   CSN: [id number] Arrival date & time: 09/04/20  1808     History Chief Complaint  Patient presents with   [name]    [name] is a 87 y.o. female.   Fall Pertinent negatives include no shortness of breath. Patient presents after fall.  Reportedly fell this morning was unable to get up.  Complaining of pain in low back and hip.  Also mild headache.  On anticoagulation for A. fib although reportedly has not had for the last 2 days because she supposed to have some dental procedure.  Has had other more frequent falls recently later states she has some pain in her right wrist but did not initially say that hurts and was not tender.  No syncope.  Was unable to get up because she is weak.  No loss of consciousness.     Past Medical History:  Diagnosis Date   Essential hypertension 09/04/2020   Peripheral polyneuropathy 09/04/2020   Permanent atrial fibrillation (HCC) 09/04/2020   Vitamin B12 deficiency 09/04/2020   Atrial fibrillation.  Increasing weakness. Patient Active Problem List   Diagnosis Date Noted   Fracture of femoral neck, left, closed (HCC) 09/04/2020   Permanent atrial fibrillation (HCC) 09/04/2020   Peripheral polyneuropathy 09/04/2020   Herniation of intervertebral disc at C4-C5 level 09/04/2020   Essential hypertension 09/04/2020   Vitamin B12 deficiency 09/04/2020   Right renal mass 09/04/2020   Acute urinary retention 09/04/2020   Chronic systolic CHF (congestive heart failure) (HCC) 09/04/2020   Acute respiratory failure with hypoxia (HCC) 09/04/2020      OB History   No obstetric history on file.     Family History  Problem Relation Age of Onset   Heart disease Neg Hx     Social History   Tobacco Use   Smoking status: Never   Smokeless tobacco: Never  Substance Use Topics   Drug use: Never    Home Medications Prior to Admission medications   Not on File    Allergies     Patient has no allergy information on record.  Review of Systems   Review of Systems  Constitutional:  Negative for appetite change.  HENT:  Negative for congestion.   Respiratory:  Negative for shortness of breath.   Musculoskeletal:  Positive for back pain.       Pelvic pain.  Neurological:  Positive for weakness.  Psychiatric/Behavioral:  Negative for confusion.    Physical Exam Updated Vital Signs BP 130/89   Pulse 93   Temp 97.7 F (36.5 C) (Oral)   Resp (!) 23   Ht 5\' 6"  (1.676 m)   Wt 71.7 kg   SpO2 96%   BMI 25.50 kg/m   Physical Exam Vitals and nursing note reviewed.  HENT:     Head: Normocephalic and atraumatic.  Eyes:     Extraocular Movements: Extraocular movements intact.     Pupils: Pupils are equal, round, and reactive to light.  Cardiovascular:     Rate and Rhythm: Regular rhythm.  Musculoskeletal:     Cervical back: Neck supple.     Comments: At the lumbar tenderness.  Also posterior pelvis tenderness and bilateral hip tenderness.  Does have a range of motion of bilateral hips but better on right.  Neurovascular intact in bilateral feet.  Mild swelling of right wrist but minimal tenderness.  No cervical spine tenderness.  Skin:    General: Skin is warm.  Neurological:  Mental Status: She is alert and oriented to person, place, and time.    ED Results / Procedures / Treatments   Labs (all labs ordered are listed, but only abnormal results are displayed) Labs Reviewed  COMPREHENSIVE METABOLIC PANEL - Abnormal; Notable for the following components:      Result Value   Glucose, Bld 166 (*)    BUN 24 (*)    Creatinine, Ser 1.02 (*)    AST 47 (*)    Alkaline Phosphatase 127 (*)    Total Bilirubin 2.2 (*)    GFR, Estimated 52 (*)    All other components within normal limits  CBC WITH DIFFERENTIAL/PLATELET - Abnormal; Notable for the following components:   MCV 103.4 (*)    Platelets 101 (*)    Neutro Abs 8.0 (*)    Lymphs Abs 0.6 (*)     All other components within normal limits  URINALYSIS, ROUTINE W REFLEX MICROSCOPIC - Abnormal; Notable for the following components:   Color, Urine AMBER (*)    APPearance HAZY (*)    All other components within normal limits  PROTIME-INR - Abnormal; Notable for the following components:   Prothrombin Time 16.6 (*)    INR 1.3 (*)    All other components within normal limits  RESP PANEL BY RT-PCR (FLU A&B, COVID) ARPGX2  CK  BRAIN NATRIURETIC PEPTIDE  C-REACTIVE PROTEIN  PROCALCITONIN  VITAMIN B12  FOLATE  CBC WITH DIFFERENTIAL/PLATELET  COMPREHENSIVE METABOLIC PANEL  BLOOD GAS, VENOUS  SAMPLE TO BLOOD BANK    EKG None  Radiology DG Wrist Complete Right  Result Date: 09/04/2020 CLINICAL DATA:  Status post fall. EXAM: RIGHT WRIST - COMPLETE 3+ VIEW COMPARISON:  None. FINDINGS: Chronic fracture deformities are seen involving the distal right radius and right ulnar styloid. There is no evidence of dislocation. Diffuse osteopenia is seen. Degenerative changes are noted throughout the right wrist. There is diffuse soft tissue swelling. IMPRESSION: Chronic fracture deformities of the distal right radius and right ulnar styloid. Electronically Signed   By: Aram Candelahaddeus  Houston M.D.   On: 09/04/2020 21:45   CT HEAD WO CONTRAST (5MM)  Result Date: 09/04/2020 CLINICAL DATA:  Fall EXAM: CT HEAD WITHOUT CONTRAST CT CERVICAL SPINE WITHOUT CONTRAST TECHNIQUE: Multidetector CT imaging of the head and cervical spine was performed following the standard protocol without intravenous contrast. Multiplanar CT image reconstructions of the cervical spine were also generated. COMPARISON:  None. FINDINGS: CT HEAD FINDINGS Brain: There is no mass, hemorrhage or extra-axial collection. There is generalized atrophy without lobar predilection. There is hypoattenuation of the periventricular white matter, most commonly indicating chronic ischemic microangiopathy. Vascular: No abnormal hyperdensity of the major  intracranial arteries or dural venous sinuses. No intracranial atherosclerosis. Skull: The visualized skull base, calvarium and extracranial soft tissues are normal. Sinuses/Orbits: No fluid levels or advanced mucosal thickening of the visualized paranasal sinuses. No mastoid or middle ear effusion. The orbits are normal. CT CERVICAL SPINE FINDINGS Alignment: No static subluxation. Facets are aligned. Occipital condyles are normally positioned. Skull base and vertebrae: No acute fracture. Soft tissues and spinal canal: No prevertebral fluid or swelling. No visible canal hematoma. Disc levels: Large central disc protrusion at C4-5 indenting the ventral spinal cord. Upper chest: No pneumothorax, pulmonary nodule or pleural effusion. Other: Normal visualized paraspinal cervical soft tissues. IMPRESSION: 1. Chronic ischemic microangiopathy and generalized atrophy without acute intracranial abnormality. 2. No acute fracture or static subluxation of the cervical spine. 3. Large central disc protrusion at C4-5 indenting  the ventral spinal cord. Electronically Signed   By: Deatra Robinson M.D.   On: 09/04/2020 20:03   CT Cervical Spine Wo Contrast  Result Date: 09/04/2020 CLINICAL DATA:  Fall EXAM: CT HEAD WITHOUT CONTRAST CT CERVICAL SPINE WITHOUT CONTRAST TECHNIQUE: Multidetector CT imaging of the head and cervical spine was performed following the standard protocol without intravenous contrast. Multiplanar CT image reconstructions of the cervical spine were also generated. COMPARISON:  None. FINDINGS: CT HEAD FINDINGS Brain: There is no mass, hemorrhage or extra-axial collection. There is generalized atrophy without lobar predilection. There is hypoattenuation of the periventricular white matter, most commonly indicating chronic ischemic microangiopathy. Vascular: No abnormal hyperdensity of the major intracranial arteries or dural venous sinuses. No intracranial atherosclerosis. Skull: The visualized skull base,  calvarium and extracranial soft tissues are normal. Sinuses/Orbits: No fluid levels or advanced mucosal thickening of the visualized paranasal sinuses. No mastoid or middle ear effusion. The orbits are normal. CT CERVICAL SPINE FINDINGS Alignment: No static subluxation. Facets are aligned. Occipital condyles are normally positioned. Skull base and vertebrae: No acute fracture. Soft tissues and spinal canal: No prevertebral fluid or swelling. No visible canal hematoma. Disc levels: Large central disc protrusion at C4-5 indenting the ventral spinal cord. Upper chest: No pneumothorax, pulmonary nodule or pleural effusion. Other: Normal visualized paraspinal cervical soft tissues. IMPRESSION: 1. Chronic ischemic microangiopathy and generalized atrophy without acute intracranial abnormality. 2. No acute fracture or static subluxation of the cervical spine. 3. Large central disc protrusion at C4-5 indenting the ventral spinal cord. Electronically Signed   By: Deatra Robinson M.D.   On: 09/04/2020 20:03   CT Lumbar Spine Wo Contrast  Result Date: 09/04/2020 CLINICAL DATA:  Fall EXAM: CT LUMBAR SPINE WITHOUT CONTRAST TECHNIQUE: Multidetector CT imaging of the lumbar spine was performed without intravenous contrast administration. Multiplanar CT image reconstructions were also generated. COMPARISON:  None. FINDINGS: Segmentation: 5 lumbar type vertebrae. Alignment: Normal. Vertebrae: No acute fracture or focal pathologic process. Paraspinal and other soft tissues: Calcific aortic atherosclerosis. Intermediate density right renal exophytic lesion measuring 3.0 cm. Disc levels: Multilevel degenerative disc disease without spinal canal or neural foraminal stenosis. IMPRESSION: 1. No acute fracture or static subluxation of the lumbar spine. 2. Multilevel degenerative disc disease without spinal canal or neural foraminal stenosis. 3. Intermediate density right renal exophytic lesion measuring 3.0 cm. Multiphase renal mass  protocol CT or MRI of the abdomen with and without contrast is recommended for complete characterization to exclude renal carcinoma and could be obtained non-emergently. Aortic Atherosclerosis (ICD10-I70.0). Electronically Signed   By: Deatra Robinson M.D.   On: 09/04/2020 19:59   DG Pelvis Portable  Result Date: 09/04/2020 CLINICAL DATA:  Status post fall EXAM: PORTABLE PELVIS 1-2 VIEWS; DG HIP (WITH OR WITHOUT PELVIS) 2-3V LEFT COMPARISON:  None. FINDINGS: Left femoral neck fracture. No left hip dislocation. Frontal view of the right hip with no definite displaced fracture. No acute displaced fracture or diastasis of the bones of the pelvis. Degenerative changes of the lumbar spine. Limited evaluation of the sacrum due to overlying bowel gas. IMPRESSION: 1. Left femoral neck fracture. 2. No left hip dislocation. 3. No acute displaced fracture or dislocation of the right hip on frontal view. 4. No acute displaced fracture or diastasis of the bones of the pelvis. Electronically Signed   By: Tish Frederickson M.D.   On: 09/04/2020 19:28   DG Chest Portable 1 View  Result Date: 09/04/2020 CLINICAL DATA:  Fall. EXAM: PORTABLE CHEST 1 VIEW COMPARISON:  February 02, 2011 FINDINGS: No pneumothorax. Stable cardiomegaly. The hila and mediastinum are unremarkable. Kerley B-lines are identified in the periphery of the left lung suggesting mild edema. No suspicious nodule, mass, or focal infiltrate. IMPRESSION: Cardiomegaly and probable mild pulmonary edema. No other acute abnormalities are identified. Electronically Signed   By: Gerome Sam III M.D.   On: 09/04/2020 19:28   DG Hip Unilat W or Wo Pelvis 2-3 Views Left  Result Date: 09/04/2020 CLINICAL DATA:  Status post fall EXAM: PORTABLE PELVIS 1-2 VIEWS; DG HIP (WITH OR WITHOUT PELVIS) 2-3V LEFT COMPARISON:  None. FINDINGS: Left femoral neck fracture. No left hip dislocation. Frontal view of the right hip with no definite displaced fracture. No acute displaced  fracture or diastasis of the bones of the pelvis. Degenerative changes of the lumbar spine. Limited evaluation of the sacrum due to overlying bowel gas. IMPRESSION: 1. Left femoral neck fracture. 2. No left hip dislocation. 3. No acute displaced fracture or dislocation of the right hip on frontal view. 4. No acute displaced fracture or diastasis of the bones of the pelvis. Electronically Signed   By: Tish Frederickson M.D.   On: 09/04/2020 19:28    Procedures Procedures   Medications Ordered in ED Medications  albuterol (PROVENTIL) (2.5 MG/3ML) 0.083% nebulizer solution 2.5 mg (2.5 mg Nebulization Given 09/04/20 2257)    Followed by  albuterol (PROVENTIL) (2.5 MG/3ML) 0.083% nebulizer solution 2.5 mg (has no administration in time range)  metoprolol tartrate (LOPRESSOR) tablet 100 mg (has no administration in time range)  fentaNYL (SUBLIMAZE) injection 12.5 mcg (has no administration in time range)    Or  fentaNYL (SUBLIMAZE) injection 25 mcg (has no administration in time range)  polyethylene glycol (MIRALAX / GLYCOLAX) packet 17 g (has no administration in time range)  ondansetron (ZOFRAN) injection 4 mg (has no administration in time range)  acetaminophen (TYLENOL) tablet 650 mg (has no administration in time range)  fentaNYL (SUBLIMAZE) injection 50 mcg (50 mcg Intravenous Given 09/04/20 2105)  ondansetron (ZOFRAN) injection 4 mg (4 mg Intravenous Given 09/04/20 2105)    ED Course  I have reviewed the triage vital signs and the nursing notes.  Pertinent labs & imaging results that were available during my care of the patient were reviewed by me and considered in my medical decision making (see chart for details).    MDM Rules/Calculators/A&P                          Patient with fall.  Has had recent falls.  Head CT done was negative for fracture.  Has left femoral neck fracture.  Does have bulging disc and cervical spine.  No new numbness or weakness.  No tenderness.  Also has exophytic  lesion on kidney.  Will need outpatient follow-up.  Has seen Dewaine Conger for wrist previously.  They do not necessarily want to stay with that group but are not opposed.  Will discuss with unassigned Ortho and internal medicine for admission.  No other apparent traumatic injury.  Reassuring CK.  Has been off her anticoagulation for 2 days pending some dental procedures.  Final Clinical Impression(s) / ED Diagnoses Final diagnoses:  Fall, initial encounter  Closed fracture of left hip, initial encounter [facility])    Rx / DC Orders ED Discharge Orders     None        Benjiman Core, MD 09/04/20 2300

## 2020-09-05 ENCOUNTER — Inpatient Hospital Stay (HOSPITAL_COMMUNITY): Payer: Medicare Other

## 2020-09-05 ENCOUNTER — Inpatient Hospital Stay (HOSPITAL_COMMUNITY): Payer: Medicare Other | Admitting: Anesthesiology

## 2020-09-05 ENCOUNTER — Encounter (HOSPITAL_COMMUNITY)
Admission: EM | Disposition: A | Discharge status: Skilled Nursing Facility | Payer: Self-pay | Source: Home / Self Care | Attending: Internal Medicine

## 2020-09-05 ENCOUNTER — Telehealth: Payer: Self-pay

## 2020-09-05 ENCOUNTER — Ambulatory Visit: Payer: Medicare Other

## 2020-09-05 DIAGNOSIS — I5022 Chronic systolic (congestive) heart failure: Secondary | ICD-10-CM

## 2020-09-05 DIAGNOSIS — Z96649 Presence of unspecified artificial hip joint: Secondary | ICD-10-CM

## 2020-09-05 HISTORY — PX: HIP ARTHROPLASTY: SHX981

## 2020-09-05 LAB — ECHOCARDIOGRAM COMPLETE
AR max vel: 2.55 cm2
AV Area VTI: 2.41 cm2
AV Area mean vel: 2.68 cm2
AV Mean grad: 3.5 mmHg
AV Peak grad: 6.9 mmHg
Ao pk vel: 1.31 m/s
Area-P 1/2: 5.66 cm2
Calc EF: 41 %
Height: 66 in
P 1/2 time: 406 msec
Radius: 0.3 cm
S' Lateral: 4.7 cm
Single Plane A2C EF: 47 %
Single Plane A4C EF: 26.8 %
Weight: 2528 oz

## 2020-09-05 LAB — I-STAT VENOUS BLOOD GAS, ED
Acid-Base Excess: 5 mmol/L — ABNORMAL HIGH (ref 0.0–2.0)
Acid-Base Excess: 6 mmol/L — ABNORMAL HIGH (ref 0.0–2.0)
Bicarbonate: 29.9 mmol/L — ABNORMAL HIGH (ref 20.0–28.0)
Bicarbonate: 32 mmol/L — ABNORMAL HIGH (ref 20.0–28.0)
Calcium, Ion: 1.11 mmol/L — ABNORMAL LOW (ref 1.15–1.40)
Calcium, Ion: 1.1 mmol/L — ABNORMAL LOW (ref 1.15–1.40)
HCT: 40 % (ref 36.0–46.0)
HCT: 41 % (ref 36.0–46.0)
Hemoglobin: 13.6 g/dL (ref 12.0–15.0)
Hemoglobin: 13.9 g/dL (ref 12.0–15.0)
O2 Saturation: 99 %
O2 Saturation: 100 %
Potassium: 3.5 mmol/L (ref 3.5–5.1)
Potassium: 3.5 mmol/L (ref 3.5–5.1)
Sodium: 141 mmol/L (ref 135–145)
Sodium: 141 mmol/L (ref 135–145)
TCO2: 31 mmol/L (ref 22–32)
TCO2: 34 mmol/L — ABNORMAL HIGH (ref 22–32)
pCO2, Ven: 46 mmHg (ref 44.0–60.0)
pCO2, Ven: 50.7 mmHg (ref 44.0–60.0)
pH, Ven: 7.422 (ref 7.250–7.430)
pH, Ven: 7.408 (ref 7.250–7.430)
pO2, Ven: 130 mmHg — ABNORMAL HIGH (ref 32.0–45.0)
pO2, Ven: 186 mmHg — ABNORMAL HIGH (ref 32.0–45.0)

## 2020-09-05 LAB — CBC WITH DIFFERENTIAL/PLATELET
Abs Immature Granulocytes: 0.04 10*3/uL (ref 0.00–0.07)
Basophils Absolute: 0 10*3/uL (ref 0.0–0.1)
Basophils Relative: 0 %
Eosinophils Absolute: 0 10*3/uL (ref 0.0–0.5)
Eosinophils Relative: 0 %
HCT: 41.8 % (ref 36.0–46.0)
Hemoglobin: 13.6 g/dL (ref 12.0–15.0)
Immature Granulocytes: 1 %
Lymphocytes Relative: 14 %
Lymphs Abs: 1.2 10*3/uL (ref 0.7–4.0)
MCH: 32.8 pg (ref 26.0–34.0)
MCHC: 32.5 g/dL (ref 30.0–36.0)
MCV: 100.7 fL — ABNORMAL HIGH (ref 80.0–100.0)
Monocytes Absolute: 1.1 10*3/uL — ABNORMAL HIGH (ref 0.1–1.0)
Monocytes Relative: 12 %
Neutro Abs: 6.3 10*3/uL (ref 1.7–7.7)
Neutrophils Relative %: 73 %
Platelets: 89 10*3/uL — ABNORMAL LOW (ref 150–400)
RBC: 4.15 MIL/uL (ref 3.87–5.11)
RDW: 13.7 % (ref 11.5–15.5)
WBC: 8.6 10*3/uL (ref 4.0–10.5)
nRBC: 0 % (ref 0.0–0.2)

## 2020-09-05 LAB — COMPREHENSIVE METABOLIC PANEL
ALT: 34 U/L (ref 0–44)
AST: 32 U/L (ref 15–41)
Albumin: 3.2 g/dL — ABNORMAL LOW (ref 3.5–5.0)
Alkaline Phosphatase: 109 U/L (ref 38–126)
Anion gap: 10 (ref 5–15)
BUN: 22 mg/dL (ref 8–23)
CO2: 27 mmol/L (ref 22–32)
Calcium: 9 mg/dL (ref 8.9–10.3)
Chloride: 102 mmol/L (ref 98–111)
Creatinine, Ser: 0.86 mg/dL (ref 0.44–1.00)
GFR, Estimated: >60 mL/min (ref >60)
Glucose, Bld: 125 mg/dL — ABNORMAL HIGH (ref 70–99)
Potassium: 3.5 mmol/L (ref 3.5–5.1)
Sodium: 139 mmol/L (ref 135–145)
Total Bilirubin: 1.9 mg/dL — ABNORMAL HIGH (ref 0.3–1.2)
Total Protein: 6.3 g/dL — ABNORMAL LOW (ref 6.5–8.1)

## 2020-09-05 LAB — SURGICAL PCR SCREEN
MRSA, PCR: NEGATIVE
Staphylococcus aureus: POSITIVE — AB

## 2020-09-05 LAB — FOLATE: Folate: 15 ng/mL (ref >5.9)

## 2020-09-05 LAB — VITAMIN B12: Vitamin B-12: 935 pg/mL — ABNORMAL HIGH (ref 180–914)

## 2020-09-05 SURGERY — HEMIARTHROPLASTY, HIP, DIRECT ANTERIOR APPROACH, FOR FRACTURE
Anesthesia: General | Site: Hip | Laterality: Left

## 2020-09-05 MED ORDER — PHENYLEPHRINE 40 MCG/ML (10ML) SYRINGE FOR IV PUSH (FOR BLOOD PRESSURE SUPPORT)
PREFILLED_SYRINGE | INTRAVENOUS | Status: DC | PRN
  Administered 2020-09-05 (×2): 120 ug via INTRAVENOUS

## 2020-09-05 MED ORDER — ROCURONIUM BROMIDE 10 MG/ML (PF) SYRINGE
PREFILLED_SYRINGE | INTRAVENOUS | Status: AC
  Filled 2020-09-05: qty 10

## 2020-09-05 MED ORDER — HYDROCODONE-ACETAMINOPHEN 5-325 MG PO TABS
1.0000 | ORAL_TABLET | ORAL | Status: DC | PRN
  Administered 2020-09-05: 1 via ORAL
  Administered 2020-09-07: 2 via ORAL
  Filled 2020-09-05: qty 1
  Filled 2020-09-05: qty 2

## 2020-09-05 MED ORDER — ACETAMINOPHEN 325 MG PO TABS
325.0000 mg | ORAL_TABLET | Freq: Four times a day (QID) | ORAL | Status: DC | PRN
  Administered 2020-09-06 – 2020-09-07 (×2): 650 mg via ORAL
  Filled 2020-09-05 (×2): qty 2

## 2020-09-05 MED ORDER — BISACODYL 10 MG RE SUPP: 10.0000 mg | Freq: Every day | RECTAL | Status: DC | PRN

## 2020-09-05 MED ORDER — POVIDONE-IODINE 10 % EX SWAB
2.0000 "application " | Freq: Once | CUTANEOUS | Status: AC
  Administered 2020-09-05: 2 via TOPICAL

## 2020-09-05 MED ORDER — EPHEDRINE 5 MG/ML INJ
INTRAVENOUS | Status: AC
  Filled 2020-09-05: qty 5

## 2020-09-05 MED ORDER — SODIUM CHLORIDE 0.9 % IV SOLN: INTRAVENOUS | Status: DC

## 2020-09-05 MED ORDER — PRESERVISION/LUTEIN PO CAPS: 1.0000 | ORAL_CAPSULE | Freq: Every day | ORAL | Status: DC

## 2020-09-05 MED ORDER — VASOPRESSIN 20 UNIT/ML IV SOLN
INTRAVENOUS | Status: AC
  Filled 2020-09-05: qty 1

## 2020-09-05 MED ORDER — LORATADINE 10 MG PO TABS
10.0000 mg | ORAL_TABLET | Freq: Every day | ORAL | Status: DC
  Administered 2020-09-06 – 2020-09-08 (×3): 10 mg via ORAL
  Filled 2020-09-05 (×3): qty 1

## 2020-09-05 MED ORDER — PROPOFOL 10 MG/ML IV BOLUS
INTRAVENOUS | Status: AC
  Filled 2020-09-05: qty 20

## 2020-09-05 MED ORDER — POLYETHYLENE GLYCOL 3350 17 G PO PACK: 17.0000 g | PACK | Freq: Every day | ORAL | Status: DC | PRN

## 2020-09-05 MED ORDER — ONDANSETRON HCL 4 MG/2ML IJ SOLN: 4.0000 mg | Freq: Four times a day (QID) | INTRAMUSCULAR | Status: DC | PRN

## 2020-09-05 MED ORDER — LACTATED RINGERS IV SOLN: INTRAVENOUS | Status: DC

## 2020-09-05 MED ORDER — FENTANYL CITRATE (PF) 250 MCG/5ML IJ SOLN
INTRAMUSCULAR | Status: DC | PRN
  Administered 2020-09-05: 50 ug via INTRAVENOUS
  Administered 2020-09-05: 25 ug via INTRAVENOUS
  Administered 2020-09-05: 50 ug via INTRAVENOUS
  Administered 2020-09-05: 25 ug via INTRAVENOUS

## 2020-09-05 MED ORDER — DIPHENHYDRAMINE HCL 12.5 MG/5ML PO ELIX
12.5000 mg | ORAL_SOLUTION | ORAL | Status: DC | PRN
  Filled 2020-09-05: qty 10

## 2020-09-05 MED ORDER — METOCLOPRAMIDE HCL 5 MG PO TABS: 5.0000 mg | ORAL_TABLET | Freq: Three times a day (TID) | ORAL | Status: DC | PRN

## 2020-09-05 MED ORDER — ORAL CARE MOUTH RINSE: 15.0000 mL | Freq: Once | OROMUCOSAL | Status: AC

## 2020-09-05 MED ORDER — PROPOFOL 10 MG/ML IV BOLUS
INTRAVENOUS | Status: DC | PRN
  Administered 2020-09-05: 20 mg via INTRAVENOUS
  Administered 2020-09-05: 30 mg via INTRAVENOUS

## 2020-09-05 MED ORDER — PHENOL 1.4 % MT LIQD: 1.0000 | OROMUCOSAL | Status: DC | PRN

## 2020-09-05 MED ORDER — CHLORHEXIDINE GLUCONATE CLOTH 2 % EX PADS: 6.0000 | MEDICATED_PAD | Freq: Every day | CUTANEOUS | Status: DC

## 2020-09-05 MED ORDER — METHOCARBAMOL 1000 MG/10ML IJ SOLN
500.0000 mg | Freq: Four times a day (QID) | INTRAVENOUS | Status: DC | PRN
  Filled 2020-09-05: qty 5

## 2020-09-05 MED ORDER — ROCURONIUM BROMIDE 10 MG/ML (PF) SYRINGE
PREFILLED_SYRINGE | INTRAVENOUS | Status: DC | PRN
  Administered 2020-09-05: 40 mg via INTRAVENOUS
  Administered 2020-09-05: 20 mg via INTRAVENOUS

## 2020-09-05 MED ORDER — CHLORHEXIDINE GLUCONATE 0.12 % MT SOLN
OROMUCOSAL | Status: AC
  Administered 2020-09-05: 15 mL via OROMUCOSAL
  Filled 2020-09-05: qty 15

## 2020-09-05 MED ORDER — CEFAZOLIN SODIUM-DEXTROSE 2-4 GM/100ML-% IV SOLN
2.0000 g | INTRAVENOUS | Status: AC
  Administered 2020-09-05: 2 g via INTRAVENOUS

## 2020-09-05 MED ORDER — TRANEXAMIC ACID-NACL 1000-0.7 MG/100ML-% IV SOLN
1000.0000 mg | Freq: Once | INTRAVENOUS | Status: AC
  Administered 2020-09-06: 1000 mg via INTRAVENOUS
  Filled 2020-09-05: qty 100

## 2020-09-05 MED ORDER — METHOCARBAMOL 500 MG PO TABS: 500.0000 mg | ORAL_TABLET | Freq: Four times a day (QID) | ORAL | Status: DC | PRN

## 2020-09-05 MED ORDER — DOCUSATE SODIUM 100 MG PO CAPS
100.0000 mg | ORAL_CAPSULE | Freq: Two times a day (BID) | ORAL | Status: DC
  Administered 2020-09-05 – 2020-09-08 (×6): 100 mg via ORAL
  Filled 2020-09-05 (×6): qty 1

## 2020-09-05 MED ORDER — FENTANYL CITRATE (PF) 250 MCG/5ML IJ SOLN
INTRAMUSCULAR | Status: AC
  Filled 2020-09-05: qty 5

## 2020-09-05 MED ORDER — METOPROLOL TARTRATE 100 MG PO TABS
100.0000 mg | ORAL_TABLET | Freq: Two times a day (BID) | ORAL | Status: DC
  Administered 2020-09-05 – 2020-09-08 (×6): 100 mg via ORAL
  Filled 2020-09-05 (×6): qty 1

## 2020-09-05 MED ORDER — HYDROMORPHONE HCL 1 MG/ML IJ SOLN: 0.5000 mg | INTRAMUSCULAR | Status: DC | PRN

## 2020-09-05 MED ORDER — MENTHOL 3 MG MT LOZG: 1.0000 | LOZENGE | OROMUCOSAL | Status: DC | PRN

## 2020-09-05 MED ORDER — ASPIRIN 81 MG PO CHEW
81.0000 mg | CHEWABLE_TABLET | Freq: Two times a day (BID) | ORAL | Status: DC
  Administered 2020-09-05 – 2020-09-07 (×4): 81 mg via ORAL
  Filled 2020-09-05 (×4): qty 1

## 2020-09-05 MED ORDER — DEXAMETHASONE SODIUM PHOSPHATE 10 MG/ML IJ SOLN
INTRAMUSCULAR | Status: DC | PRN
  Administered 2020-09-05: 5 mg via INTRAVENOUS

## 2020-09-05 MED ORDER — TRANEXAMIC ACID-NACL 1000-0.7 MG/100ML-% IV SOLN
INTRAVENOUS | Status: AC
  Filled 2020-09-05: qty 100

## 2020-09-05 MED ORDER — LIDOCAINE 2% (20 MG/ML) 5 ML SYRINGE
INTRAMUSCULAR | Status: DC | PRN
  Administered 2020-09-05: 40 mg via INTRAVENOUS

## 2020-09-05 MED ORDER — CEFAZOLIN SODIUM-DEXTROSE 2-4 GM/100ML-% IV SOLN
2.0000 g | Freq: Four times a day (QID) | INTRAVENOUS | Status: AC
Start: 2020-09-05 — End: 2020-09-06
  Administered 2020-09-05 – 2020-09-06 (×2): 2 g via INTRAVENOUS
  Filled 2020-09-05 (×2): qty 100

## 2020-09-05 MED ORDER — CHLORHEXIDINE GLUCONATE 4 % EX LIQD: 60.0000 mL | Freq: Once | CUTANEOUS | Status: DC

## 2020-09-05 MED ORDER — MUPIROCIN 2 % EX OINT
1.0000 "application " | TOPICAL_OINTMENT | Freq: Two times a day (BID) | CUTANEOUS | Status: DC
  Administered 2020-09-06 – 2020-09-08 (×5): 1 via NASAL
  Filled 2020-09-05 (×2): qty 22

## 2020-09-05 MED ORDER — ONDANSETRON HCL 4 MG/2ML IJ SOLN
INTRAMUSCULAR | Status: DC | PRN
  Administered 2020-09-05: 4 mg via INTRAVENOUS

## 2020-09-05 MED ORDER — LIDOCAINE 2% (20 MG/ML) 5 ML SYRINGE
INTRAMUSCULAR | Status: AC
  Filled 2020-09-05: qty 5

## 2020-09-05 MED ORDER — HYDROCODONE-ACETAMINOPHEN 7.5-325 MG PO TABS: 1.0000 | ORAL_TABLET | ORAL | Status: DC | PRN

## 2020-09-05 MED ORDER — DEXAMETHASONE SODIUM PHOSPHATE 10 MG/ML IJ SOLN
10.0000 mg | Freq: Once | INTRAMUSCULAR | Status: AC
  Administered 2020-09-06: 10 mg via INTRAVENOUS
  Filled 2020-09-05: qty 1

## 2020-09-05 MED ORDER — TRANEXAMIC ACID-NACL 1000-0.7 MG/100ML-% IV SOLN
1000.0000 mg | INTRAVENOUS | Status: AC
  Administered 2020-09-05: 1000 mg via INTRAVENOUS
  Filled 2020-09-05: qty 100

## 2020-09-05 MED ORDER — CHLORHEXIDINE GLUCONATE 0.12 % MT SOLN
15.0000 mL | Freq: Once | OROMUCOSAL | Status: AC
  Filled 2020-09-05: qty 15

## 2020-09-05 MED ORDER — CHLORHEXIDINE GLUCONATE CLOTH 2 % EX PADS
6.0000 | MEDICATED_PAD | Freq: Every day | CUTANEOUS | Status: DC
  Administered 2020-09-06: 6 via TOPICAL

## 2020-09-05 MED ORDER — ONDANSETRON HCL 4 MG PO TABS
4.0000 mg | ORAL_TABLET | Freq: Four times a day (QID) | ORAL | Status: DC | PRN
  Administered 2020-09-06: 4 mg via ORAL
  Filled 2020-09-05: qty 1

## 2020-09-05 MED ORDER — GADOBUTROL 1 MMOL/ML IV SOLN
7.0000 mL | Freq: Once | INTRAVENOUS | Status: AC | PRN
  Administered 2020-09-05: 7 mL via INTRAVENOUS

## 2020-09-05 MED ORDER — 0.9 % SODIUM CHLORIDE (POUR BTL) OPTIME
TOPICAL | Status: DC | PRN
  Administered 2020-09-05: 1000 mL

## 2020-09-05 MED ORDER — FERROUS GLUCONATE 239 (27 FE) MG PO TABS: 27.0000 mg | ORAL_TABLET | Freq: Every day | ORAL | Status: DC

## 2020-09-05 MED ORDER — METOCLOPRAMIDE HCL 5 MG/ML IJ SOLN: 5.0000 mg | Freq: Three times a day (TID) | INTRAMUSCULAR | Status: DC | PRN

## 2020-09-05 MED ORDER — SUGAMMADEX SODIUM 200 MG/2ML IV SOLN
INTRAVENOUS | Status: DC | PRN
  Administered 2020-09-05: 200 mg via INTRAVENOUS

## 2020-09-05 MED ORDER — CEFAZOLIN SODIUM-DEXTROSE 2-4 GM/100ML-% IV SOLN
INTRAVENOUS | Status: AC
  Filled 2020-09-05: qty 100

## 2020-09-05 MED ORDER — DEXTROSE IN LACTATED RINGERS 5 % IV SOLN: INTRAVENOUS | Status: DC

## 2020-09-05 MED ORDER — PHENYLEPHRINE HCL-NACL 20-0.9 MG/250ML-% IV SOLN
INTRAVENOUS | Status: DC | PRN
  Administered 2020-09-05: 25 ug/min via INTRAVENOUS

## 2020-09-05 SURGICAL SUPPLY — 50 items
BAG COUNTER SPONGE SURGICOUNT (BAG) ×2 IMPLANT
BLADE SAW SGTL 18X1.27X75 (BLADE) ×2 IMPLANT
COVER SURGICAL LIGHT HANDLE (MISCELLANEOUS) ×2 IMPLANT
DERMABOND ADVANCED (GAUZE/BANDAGES/DRESSINGS) ×1
DERMABOND ADVANCED .7 DNX12 (GAUZE/BANDAGES/DRESSINGS) ×1 IMPLANT
DRAPE IMP U-DRAPE 54X76 (DRAPES) ×2 IMPLANT
DRAPE INCISE IOBAN 85X60 (DRAPES) ×2 IMPLANT
DRAPE ORTHO SPLIT 77X108 STRL (DRAPES) ×2
DRAPE SURG ORHT 6 SPLT 77X108 (DRAPES) ×2 IMPLANT
DRAPE U-SHAPE 47X51 STRL (DRAPES) ×2 IMPLANT
DRSG AQUACEL AG ADV 3.5X10 (GAUZE/BANDAGES/DRESSINGS) ×2 IMPLANT
DURAPREP 26ML APPLICATOR (WOUND CARE) ×2 IMPLANT
ELECT BLADE 4.0 EZ CLEAN MEGAD (MISCELLANEOUS) ×2
ELECT REM PT RETURN 9FT ADLT (ELECTROSURGICAL) ×2
ELECTRODE BLDE 4.0 EZ CLN MEGD (MISCELLANEOUS) ×1 IMPLANT
ELECTRODE REM PT RTRN 9FT ADLT (ELECTROSURGICAL) ×1 IMPLANT
EVACUATOR 1/8 PVC DRAIN (DRAIN) IMPLANT
FACESHIELD WRAPAROUND (MASK) ×4 IMPLANT
FACESHIELD WRAPAROUND OR TEAM (MASK) ×2 IMPLANT
GLOVE SURG LTX SZ8 (GLOVE) ×2 IMPLANT
GLOVE SURG ORTHO LTX SZ7.5 (GLOVE) ×2 IMPLANT
GLOVE SURG UNDER POLY LF SZ7.5 (GLOVE) ×2 IMPLANT
GLOVE SURG UNDER POLY LF SZ8.5 (GLOVE) ×4 IMPLANT
GOWN STRL REUS W/ TWL LRG LVL3 (GOWN DISPOSABLE) ×3 IMPLANT
GOWN STRL REUS W/TWL 2XL LVL3 (GOWN DISPOSABLE) ×2 IMPLANT
GOWN STRL REUS W/TWL LRG LVL3 (GOWN DISPOSABLE) ×3
HANDPIECE INTERPULSE COAX TIP (DISPOSABLE)
HEAD FEM UNIPOLAR 48 OD (Hips) ×1 IMPLANT
IMMOBILIZER KNEE 22 UNIV (SOFTGOODS) ×1 IMPLANT
KIT BASIN OR (CUSTOM PROCEDURE TRAY) ×2 IMPLANT
KIT TURNOVER KIT B (KITS) ×2 IMPLANT
MANIFOLD NEPTUNE II (INSTRUMENTS) ×2 IMPLANT
NS IRRIG 1000ML POUR BTL (IV SOLUTION) ×2 IMPLANT
PACK TOTAL JOINT (CUSTOM PROCEDURE TRAY) ×2 IMPLANT
PACK UNIVERSAL I (CUSTOM PROCEDURE TRAY) ×2 IMPLANT
PAD ARMBOARD 7.5X6 YLW CONV (MISCELLANEOUS) ×4 IMPLANT
SET HNDPC FAN SPRY TIP SCT (DISPOSABLE) IMPLANT
SPACER FEM TAPERED +0 12/14 (Hips) ×1 IMPLANT
SPONGE T-LAP 4X18 ~~LOC~~+RFID (SPONGE) ×4 IMPLANT
STEM FEMORAL SZ 6MM STD ACTIS (Stem) ×1 IMPLANT
SUT MNCRL AB 4-0 PS2 18 (SUTURE) ×1 IMPLANT
SUT VIC AB 1 CT1 27 (SUTURE) ×1
SUT VIC AB 1 CT1 27XBRD ANBCTR (SUTURE) ×2 IMPLANT
SUT VIC AB 2-0 CT1 27 (SUTURE) ×2
SUT VIC AB 2-0 CT1 TAPERPNT 27 (SUTURE) IMPLANT
SUT VLOC 180 0 24IN GS25 (SUTURE) ×1 IMPLANT
TOWEL GREEN STERILE (TOWEL DISPOSABLE) ×2 IMPLANT
TOWEL GREEN STERILE FF (TOWEL DISPOSABLE) ×2 IMPLANT
TRAY FOLEY W/BAG SLVR 14FR (SET/KITS/TRAYS/PACK) IMPLANT
WATER STERILE IRR 1000ML POUR (IV SOLUTION) ×8 IMPLANT

## 2020-09-05 NOTE — Discharge Instructions (Signed)

## 2020-09-05 NOTE — Anesthesia Preprocedure Evaluation (Addendum)
Anesthesia Evaluation  Patient identified by MRN, date of birth, ID band Patient awake    Reviewed: Allergy & Precautions, NPO status , Patient's Chart, lab work & pertinent test results  Airway Mallampati: II  TM Distance: >3 FB Neck ROM: Full    Dental  (+) Dental Advisory Given   Pulmonary neg pulmonary ROS,    breath sounds clear to auscultation       Cardiovascular hypertension, Pt. on medications and Pt. on home beta blockers +CHF  + dysrhythmias Atrial Fibrillation  Rhythm:Irregular Rate:Normal  EF 35%, mildly reduced RV function, severe pulm HTN. Dilated atrium bilaterally, mod TR   Neuro/Psych  Neuromuscular disease    GI/Hepatic negative GI ROS, Neg liver ROS,   Endo/Other  negative endocrine ROS  Renal/GU negative Renal ROS     Musculoskeletal   Abdominal   Peds  Hematology  (+) Blood dyscrasia, ,   Anesthesia Other Findings   Reproductive/Obstetrics                            Lab Results  Component Value Date   WBC 8.6 09/05/2020   HGB 13.9 09/05/2020   HCT 41.0 09/05/2020   MCV 100.7 (H) 09/05/2020   PLT 89 (L) 09/05/2020   Lab Results  Component Value Date   CREATININE 0.86 09/05/2020   BUN 22 09/05/2020   NA 141 09/05/2020   K 3.5 09/05/2020   CL 102 09/05/2020   CO2 27 09/05/2020    Anesthesia Physical Anesthesia Plan  ASA: 4  Anesthesia Plan: General   Post-op Pain Management:    Induction: Intravenous  PONV Risk Score and Plan: 3 and Dexamethasone, Ondansetron and Treatment may vary due to age or medical condition  Airway Management Planned: Oral ETT  Additional Equipment:   Intra-op Plan:   Post-operative Plan: Extubation in OR  Informed Consent: I have reviewed the patients History and Physical, chart, labs and discussed the procedure including the risks, benefits and alternatives for the proposed anesthesia with the patient or authorized  representative who has indicated his/her understanding and acceptance.   Patient has DNR.  Discussed DNR with power of attorney and Suspend DNR.   Dental advisory given  Plan Discussed with: CRNA  Anesthesia Plan Comments:        Anesthesia Quick Evaluation

## 2020-09-05 NOTE — ED Notes (Signed)
Dr.Arrien returned page at this time stating that he will place IVF fluids due to NPO status and the plan is for pt to go to OR today with no ETA. Dr.Arrien is also coming to see pt and give son treatment update. Son notified of plan. Will continue to monitor.

## 2020-09-05 NOTE — Telephone Encounter (Signed)
Olegario Messier called from [facility] ER reporting she needs current warfarin dosing for pt and pt's son does not have the dosing with him.  Advised 2.5mg  on Monday and Friday and 5mg  all other days. read back dosing and verbalized understanding.

## 2020-09-05 NOTE — H&P (View-Only) (Signed)
Reason for Consult: left hip fracture Referring Physician: Ella Jubilee, MD  [name] is an 87 y.o. female.  HPI: Pertinent negatives include no shortness of breath. Patient presents after fall.  Reportedly fell this morning was unable to get up.  Complaining of pain in low back and left hip.  Also mild headache.  On Coumadin as anticoagulation for A. fib although reportedly has not had for the last 2 days because she supposed to have some dental procedure.  Has had other more frequent falls recently later states she has some pain in her right wrist but did not initially say that hurts and was not tender.  No syncope.  Was unable to get up because she is weak.  No loss of consciousness.  Past Medical History:  Diagnosis Date   Essential hypertension 09/04/2020   Peripheral polyneuropathy 09/04/2020   Permanent atrial fibrillation (HCC) 09/04/2020   Vitamin B12 deficiency 09/04/2020    History reviewed. No pertinent surgical history.  Family History  Problem Relation Age of Onset   Heart disease Neg Hx     Social History:  reports that she has never smoked. She has never used smokeless tobacco. She reports that she does not use drugs. No history on file for alcohol use.  Allergies:  Allergies  Allergen Reactions   Bactrim [Sulfamethoxazole-Trimethoprim] Nausea Only and Other (See Comments)    Headaches, also (per other chart in Epic)   Codeine Nausea Only    Per other chart in Epic   Contrast Media [Iodinated Diagnostic Agents] Other (See Comments)    Per other chart in Epic-  reaction not cited   Gabapentin Nausea And Vomiting and Other (See Comments)    Per the other chart in Epic: "The patient did not fell like herself, also."   Lactose Intolerance (Gi) Diarrhea and Nausea And Vomiting   Macrobid [Nitrofurantoin] Other (See Comments)    "Allergy" noted in other chart in Epic-  reaction not cited   Other Other (See Comments)    Poor air quality and seasonal allergies- Congestion,  itchy eyes, runny nose   Penicillins Nausea And Vomiting and Other (See Comments)    Reaction in other chart in Epic (N/V)   Tape Other (See Comments)    SKIN IS VERY THIN AND WILL TEAR AND BRUISE EASILY!!!    Medications: I have reviewed the patient's current medications. Scheduled:  metoprolol tartrate  100 mg Oral BID    Results for orders placed or performed during the hospital encounter of 09/04/20 (from the past 24 hour(s))  Comprehensive metabolic panel     Status: Abnormal   Collection Time: 09/04/20  6:20 PM  Result Value Ref Range   Sodium 139 135 - 145 mmol/L   Potassium 4.0 3.5 - 5.1 mmol/L   Chloride 103 98 - 111 mmol/L   CO2 25 22 - 32 mmol/L   Glucose, Bld 166 (H) 70 - 99 mg/dL   BUN 24 (H) 8 - 23 mg/dL   Creatinine, Ser 8.29 (H) 0.44 - 1.00 mg/dL   Calcium 9.2 8.9 - 56.2 mg/dL   Total Protein 7.1 6.5 - 8.1 g/dL   Albumin 3.8 3.5 - 5.0 g/dL   AST 47 (H) 15 - 41 U/L   ALT 43 0 - 44 U/L   Alkaline Phosphatase 127 (H) 38 - 126 U/L   Total Bilirubin 2.2 (H) 0.3 - 1.2 mg/dL   GFR, Estimated 52 (L) >60 mL/min   Anion gap 11 5 - 15  CBC  with Differential     Status: Abnormal   Collection Time: 09/04/20  6:20 PM  Result Value Ref Range   WBC 9.4 4.0 - 10.5 K/uL   RBC 4.45 3.87 - 5.11 MIL/uL   Hemoglobin 14.6 12.0 - 15.0 g/dL   HCT 11.946.0 14.736.0 - 82.946.0 %   MCV 103.4 (H) 80.0 - 100.0 fL   MCH 32.8 26.0 - 34.0 pg   MCHC 31.7 30.0 - 36.0 g/dL   RDW 56.213.6 13.011.5 - 86.515.5 %   Platelets 101 (L) 150 - 400 K/uL   nRBC 0.0 0.0 - 0.2 %   Neutrophils Relative % 87 %   Neutro Abs 8.0 (H) 1.7 - 7.7 K/uL   Lymphocytes Relative 6 %   Lymphs Abs 0.6 (L) 0.7 - 4.0 K/uL   Monocytes Relative 7 %   Monocytes Absolute 0.7 0.1 - 1.0 K/uL   Eosinophils Relative 0 %   Eosinophils Absolute 0.0 0.0 - 0.5 K/uL   Basophils Relative 0 %   Basophils Absolute 0.0 0.0 - 0.1 K/uL   Immature Granulocytes 0 %   Abs Immature Granulocytes 0.04 0.00 - 0.07 K/uL  CK     Status: None   Collection  Time: 09/04/20  6:20 PM  Result Value Ref Range   Total CK 155 38 - 234 U/L  Protime-INR     Status: Abnormal   Collection Time: 09/04/20  6:20 PM  Result Value Ref Range   Prothrombin Time 16.6 (H) 11.4 - 15.2 seconds   INR 1.3 (H) 0.8 - 1.2  Sample to Blood Bank     Status: None   Collection Time: 09/04/20  6:20 PM  Result Value Ref Range   Blood Bank Specimen SAMPLE AVAILABLE FOR TESTING    Sample Expiration      09/05/2020,2359 Performed at [facility], [address] [place], [place] [address]   Resp Panel by RT-PCR (Flu A&B, Covid) Nasopharyngeal Swab     Status: None   Collection Time: 09/04/20  6:51 PM   Specimen: Nasopharyngeal Swab; Nasopharyngeal(NP) swabs in vial transport medium  Result Value Ref Range   SARS Coronavirus 2 by RT PCR NEGATIVE NEGATIVE   Influenza A by PCR NEGATIVE NEGATIVE   Influenza B by PCR NEGATIVE NEGATIVE  Urinalysis, Routine w reflex microscopic Urine, Clean Catch     Status: Abnormal   Collection Time: 09/04/20 10:23 PM  Result Value Ref Range   Color, Urine AMBER (A) YELLOW   APPearance HAZY (A) CLEAR   Specific Gravity, Urine 1.015 1.005 - 1.030   pH 5.0 5.0 - 8.0   Glucose, UA NEGATIVE NEGATIVE mg/dL   Hgb urine dipstick NEGATIVE NEGATIVE   Bilirubin Urine NEGATIVE NEGATIVE   Ketones, ur NEGATIVE NEGATIVE mg/dL   Protein, ur NEGATIVE NEGATIVE mg/dL   Nitrite NEGATIVE NEGATIVE   Leukocytes,Ua NEGATIVE NEGATIVE  I-Stat venous blood gas, ED     Status: Abnormal   Collection Time: 09/05/20  4:26 AM  Result Value Ref Range   pH, Ven 7.422 7.250 - 7.430   pCO2, Ven 46.0 44.0 - 60.0 mmHg   pO2, Ven 130.0 (H) 32.0 - 45.0 mmHg   Bicarbonate 29.9 (H) 20.0 - 28.0 mmol/L   TCO2 31 22 - 32 mmol/L   O2 Saturation 99.0 %   Acid-Base Excess 5.0 (H) 0.0 - 2.0 mmol/L   Sodium 141 135 - 145 mmol/L   Potassium 3.5 3.5 - 5.1 mmol/L   Calcium, Ion 1.11 (L) 1.15 -  1.40 mmol/L   HCT 40.0 36.0 - 46.0 %   Hemoglobin 13.6 12.0 - 15.0 g/dL    Sample type VENOUS   Vitamin B12     Status: Abnormal   Collection Time: 09/05/20  4:34 AM  Result Value Ref Range   Vitamin B-12 935 (H) 180 - 914 pg/mL  Folate     Status: None   Collection Time: 09/05/20  4:34 AM  Result Value Ref Range   Folate 15.0 >5.9 ng/mL  CBC WITH DIFFERENTIAL     Status: Abnormal   Collection Time: 09/05/20  4:34 AM  Result Value Ref Range   WBC 8.6 4.0 - 10.5 K/uL   RBC 4.15 3.87 - 5.11 MIL/uL   Hemoglobin 13.6 12.0 - 15.0 g/dL   HCT 40.9 81.1 - 91.4 %   MCV 100.7 (H) 80.0 - 100.0 fL   MCH 32.8 26.0 - 34.0 pg   MCHC 32.5 30.0 - 36.0 g/dL   RDW 78.2 95.6 - 21.3 %   Platelets 89 (L) 150 - 400 K/uL   nRBC 0.0 0.0 - 0.2 %   Neutrophils Relative % 73 %   Neutro Abs 6.3 1.7 - 7.7 K/uL   Lymphocytes Relative 14 %   Lymphs Abs 1.2 0.7 - 4.0 K/uL   Monocytes Relative 12 %   Monocytes Absolute 1.1 (H) 0.1 - 1.0 K/uL   Eosinophils Relative 0 %   Eosinophils Absolute 0.0 0.0 - 0.5 K/uL   Basophils Relative 0 %   Basophils Absolute 0.0 0.0 - 0.1 K/uL   Immature Granulocytes 1 %   Abs Immature Granulocytes 0.04 0.00 - 0.07 K/uL  Comprehensive metabolic panel     Status: Abnormal   Collection Time: 09/05/20  4:34 AM  Result Value Ref Range   Sodium 139 135 - 145 mmol/L   Potassium 3.5 3.5 - 5.1 mmol/L   Chloride 102 98 - 111 mmol/L   CO2 27 22 - 32 mmol/L   Glucose, Bld 125 (H) 70 - 99 mg/dL   BUN 22 8 - 23 mg/dL   Creatinine, Ser 0.86 0.44 - 1.00 mg/dL   Calcium 9.0 8.9 - 57.8 mg/dL   Total Protein 6.3 (L) 6.5 - 8.1 g/dL   Albumin 3.2 (L) 3.5 - 5.0 g/dL   AST 32 15 - 41 U/L   ALT 34 0 - 44 U/L   Alkaline Phosphatase 109 38 - 126 U/L   Total Bilirubin 1.9 (H) 0.3 - 1.2 mg/dL   GFR, Estimated >46 >96 mL/min   Anion gap 10 5 - 15  I-Stat venous blood gas, ED     Status: Abnormal   Collection Time: 09/05/20  6:39 AM  Result Value Ref Range   pH, Ven 7.408 7.250 - 7.430   pCO2, Ven 50.7 44.0 - 60.0 mmHg   pO2, Ven 186.0 (H) 32.0 - 45.0 mmHg    Bicarbonate 32.0 (H) 20.0 - 28.0 mmol/L   TCO2 34 (H) 22 - 32 mmol/L   O2 Saturation 100.0 %   Acid-Base Excess 6.0 (H) 0.0 - 2.0 mmol/L   Sodium 141 135 - 145 mmol/L   Potassium 3.5 3.5 - 5.1 mmol/L   Calcium, Ion 1.10 (L) 1.15 - 1.40 mmol/L   HCT 41.0 36.0 - 46.0 %   Hemoglobin 13.9 12.0 - 15.0 g/dL   Sample type VENOUS     X-ray: CLINICAL DATA:  Status post fall   EXAM: PORTABLE PELVIS 1-2 VIEWS; DG HIP (WITH OR WITHOUT PELVIS) 2-3V  LEFT   COMPARISON:  None.   FINDINGS: Left femoral neck fracture. No left hip dislocation. Frontal view of the right hip with no definite displaced fracture. No acute displaced fracture or diastasis of the bones of the pelvis. Degenerative changes of the lumbar spine. Limited evaluation of the sacrum due to overlying bowel gas.   IMPRESSION: 1. Left femoral neck fracture. 2. No left hip dislocation. 3. No acute displaced fracture or dislocation of the right hip on frontal view. 4. No acute displaced fracture or diastasis of the bones of the pelvis.     Electronically Signed   By: Tish Frederickson M.D.  ROS As per HPI  Blood pressure 121/81, pulse 92, temperature 97.9 F (36.6 C), temperature source Oral, resp. rate 18, height 5\' 6"  (1.676 m), weight 71.7 kg, SpO2 97 %.  Physical Exam: HENT:     Head: Normocephalic and atraumatic.  Eyes:     Extraocular Movements: Extraocular movements intact.     Pupils: Pupils are equal, round, and reactive to light.  Cardiovascular:     Rate and Rhythm: Regular rhythm.  Musculoskeletal:     Cervical back: Neck supple.     Comments: At the lumbar tenderness.  Also posterior pelvis tenderness and bilateral hip tenderness.  Painful shortened and externally rotated LLE.  Neurovascular intact in bilateral feet.  Mild swelling of right wrist but minimal tenderness.  No cervical spine tenderness.  Skin:    General: Skin is warm.  Neurological:     Mental Status: She is alert and oriented to person,  place, and time.   Assessment/Plan: Displaced left hip femoral neck fracture  Plan: NPO Discussed managing her hip fracture with hemiarthroplasty Post op she likely be WBAT Hold chemoprophylaxis  Resume Coumadin 8/30  9/30 09/05/2020, 1:35 PM

## 2020-09-05 NOTE — Anesthesia Procedure Notes (Signed)
Procedure Name: Intubation Date/Time: 09/05/2020 5:10 PM Performed by: Dorthea Cove, CRNA Pre-anesthesia Checklist: Patient identified, Emergency Drugs available, Suction available and Patient being monitored Patient Re-evaluated:Patient Re-evaluated prior to induction Oxygen Delivery Method: Circle system utilized Preoxygenation: Pre-oxygenation with 100% oxygen Induction Type: IV induction Ventilation: Mask ventilation without difficulty and Oral airway inserted - appropriate to patient size Laryngoscope Size: Mac and 3 Grade View: Grade I Tube type: Oral Tube size: 7.0 mm Number of attempts: 1 Airway Equipment and Method: Stylet and Oral airway Placement Confirmation: ETT inserted through vocal cords under direct vision, positive ETCO2 and breath sounds checked- equal and bilateral Secured at: 22 cm Tube secured with: Tape Dental Injury: Teeth and Oropharynx as per pre-operative assessment

## 2020-09-05 NOTE — Progress Notes (Signed)
*  PRELIMINARY RESULTS* Echocardiogram 2D Echocardiogram has been performed.  Neomia Dear RDCS 09/05/2020, 10:15 AM

## 2020-09-05 NOTE — Progress Notes (Signed)
PROGRESS NOTE    [name]  GEZ:[id number] DOB: 05/22/1926 DOA: 09/04/2020 PCP: [name] Covey, MD    Brief Narrative:  [name] was admitted to the hospital with the working diagnosis of left femoral neck fracture.  87 year old female past medical history for systolic heart failure, atrial fibrillation, hypertension, B12 deficiency, benign positional vertigo who presented after mechanical fall.  Apparently patient she had an unwitnessed fall, she was found by her son with severe pain in her left lower extremity and left hip.  Because of severe pain she was brought to the hospital for evaluation.  On her initial physical examination her blood pressure was 129/99, heart rate 101, respiratory rate 23, oxygen saturation 96%, her lungs had faint bibasilar rales, intermittent expiratory wheezing, heart S1-S2, present, irregular irregular, abdomen was soft and nontender, 1+ pitting bilateral Sumathi edema.  Sodium 139, potassium 4.0, chloride 103, bicarb 25, glucose 166, BUN 24, creatinine 1.0, white count 9.4, hemoglobin 14.6, hematocrit 46.0, platelets 101. SARS COVID-19 negative.  Urinalysis specific gravity 1.015, negative nitrates.  Head CT with chronic ischemic microangiopathy, no acute fracture subluxation at the cervical spine.  Pelvic radiographs with left femoral neck fracture, no left hip dislocation.  Chest radiograph with cardiomegaly, left lower lobe atelectasis.  Increased interstitial markings bilaterally. Cervical spine MRI no cord compression.  EKG 99 bpm, left axis deviation, normal intervals, atrial fibrillation rhythm, no significant ST segment or T wave changes, positive LVH.  Assessment & Plan:   Principal Problem:   Fracture of femoral neck, left, closed (HCC) Active Problems:   Permanent atrial fibrillation (HCC)   Peripheral polyneuropathy   Herniation of intervertebral disc at C4-C5 level   Essential hypertension   Vitamin B12 deficiency   Right  renal mass   Acute urinary retention   Chronic systolic CHF (congestive heart failure) (HCC)   Acute respiratory failure with hypoxia (HCC)   Left femoral neck fracture. Continue pain control and DVT prophylaxis. Plan for surgical intervention today. Will follow with post op recommendations from surgery.  Complicated with acute urinary retention, foley catheter has been placed, will attempt voiding trial after surgical intervention of left hip.  2. Permanent atrial fibrillation. Continue rate control with metoprolol. At home patient on warfarin for anticoagulation. Continue to hold on warfarin in the setting of pre-operative hip fracture. INR is 1.3, she had hold warfarin as outpatient in preparation for a dental procedure.  3. Systolic heart failure/ RV failure with chronic core pulmonale/ pulmonary hypertension class 2 No clinical signs of volume overload, patient had been npo and has  dry mucous membranes. Add gentle hydration for now. Continue to hold on diuretics for now.  Follow up echocardiogram with LV systolic function of 35%. Severe hypokinesis of the septal wall and interior lateral wall. RV systolic function mildly reduced with moderate enlarged cavity. Severe elevated systolic pulmonary pressure, 72.2 mmHg. Biatrial dilatatio.  Gentle volume repletion today with isotonic saline at 50 ml per.    Patient continue to be at high risk for medical complications related to acute hip fracture.   Status is: Inpatient  Remains inpatient appropriate because:Inpatient level of care appropriate due to severity of illness  Dispo: The patient is from: Home              Anticipated d/c is to: Home              Patient currently is not medically stable to d/c.   Difficult to place patient No  DVT prophylaxis: enoxaparin  Code Status:     DNR Family Communication:  I spoke with patient's son at the bedside, we talked in detail about patient's condition, plan of care and prognosis  and all questions were addressed.     Consultants:  Orthopedics      Subjective: Patient continue to have right lower extremity pain, no nausea or vomiting, no dyspnea or chest pain   Objective: Vitals:   09/05/20 1300 09/05/20 1345 09/05/20 1430 09/05/20 1519  BP: 121/81 119/77 115/74 120/75  Pulse: 92 90 83 81  Resp: 18 15 16 15   Temp:    99.1 F (37.3 C)  TempSrc:    Oral  SpO2: 97% 97% 96% 96%  Weight:      Height:        Intake/Output Summary (Last 24 hours) at 09/05/2020 1524 Last data filed at 09/05/2020 09/07/2020 Gross per 24 hour  Intake --  Output 860 ml  Net -860 ml   Filed Weights   09/04/20 1858  Weight: 71.7 kg    Examination:   General: Not in pain or dyspnea, deconditioned  Neurology: Awake and alert, non focal  E ENT: no pallor, no icterus, oral mucosa dry Cardiovascular: No JVD. S1-S2 present, irregularly irregular with no gallops, rubs, or murmurs. No lower extremity edema. Pulmonary: positive breath sounds bilaterally, adequate air movement, no wheezing, rhonchi or rales. Gastrointestinal. Abdomen protuberant, soft and non tender Skin. No rashes Musculoskeletal: shortened right lower extremity      Data Reviewed: I have personally reviewed following labs and imaging studies  CBC: Recent Labs  Lab 09/04/20 1820 09/05/20 0426 09/05/20 0434 09/05/20 0639  WBC 9.4  --  8.6  --   NEUTROABS 8.0*  --  6.3  --   HGB 14.6 13.6 13.6 13.9  HCT 46.0 40.0 41.8 41.0  MCV 103.4*  --  100.7*  --   PLT 101*  --  89*  --    Basic Metabolic Panel: Recent Labs  Lab 09/04/20 1820 09/05/20 0426 09/05/20 0434 09/05/20 0639  NA 139 141 139 141  K 4.0 3.5 3.5 3.5  CL 103  --  102  --   CO2 25  --  27  --   GLUCOSE 166*  --  125*  --   BUN 24*  --  22  --   CREATININE 1.02*  --  0.86  --   CALCIUM 9.2  --  9.0  --    GFR: Estimated Creatinine Clearance: 43.3 mL/min (by C-G formula based on SCr of 0.86 mg/dL). Liver Function Tests: Recent Labs   Lab 09/04/20 1820 09/05/20 0434  AST 47* 32  ALT 43 34  ALKPHOS 127* 109  BILITOT 2.2* 1.9*  PROT 7.1 6.3*  ALBUMIN 3.8 3.2*   No results for input(s): LIPASE, AMYLASE in the last 168 hours. No results for input(s): AMMONIA in the last 168 hours. Coagulation Profile: Recent Labs  Lab 09/04/20 1820  INR 1.3*   Cardiac Enzymes: Recent Labs  Lab 09/04/20 1820  CKTOTAL 155   BNP (last 3 results) No results for input(s): PROBNP in the last 8760 hours. HbA1C: No results for input(s): HGBA1C in the last 72 hours. CBG: No results for input(s): GLUCAP in the last 168 hours. Lipid Profile: No results for input(s): CHOL, HDL, LDLCALC, TRIG, CHOLHDL, LDLDIRECT in the last 72 hours. Thyroid Function Tests: No results for input(s): TSH, T4TOTAL, FREET4, T3FREE, THYROIDAB in the last 72 hours. Anemia Panel: Recent Labs  09/05/20 0434  VITAMINB12 935*  FOLATE 15.0      Radiology Studies: I have reviewed all of the imaging during this hospital visit personally     Scheduled Meds:  chlorhexidine  60 mL Topical Once   chlorhexidine  15 mL Mouth/Throat Once   Or   mouth rinse  15 mL Mouth Rinse Once   chlorhexidine       metoprolol tartrate  100 mg Oral BID   povidone-iodine  2 application Topical Once   Continuous Infusions:  sodium chloride     ceFAZolin     [START ON 09/06/2020]  ceFAZolin (ANCEF) IV     dextrose 5% lactated ringers     lactated ringers     tranexamic acid     tranexamic acid       LOS: 1 day        Lukasz Rogus Annett Gula, MD

## 2020-09-05 NOTE — ED Notes (Signed)
Son is asking for treatment plan for pt. As per note, pt will be evaluated by orthopedic MD at this time. Hospitalist paged.

## 2020-09-05 NOTE — Interval H&P Note (Signed)
History and Physical Interval Note:  09/05/2020 4:45 PM  [name]  has presented today for surgery, with the diagnosis of Left Hip Fx.  The various methods of treatment have been discussed with the patient and family. After consideration of risks, benefits and other options for treatment, the patient has consented to  Procedure(s): ARTHROPLASTY BIPOLAR HIP (HEMIARTHROPLASTY) (Left) as a surgical intervention.  The patient's history has been reviewed, patient examined, no change in status, stable for surgery.  I have reviewed the patient's chart and labs.  Questions were answered to the patient's satisfaction.     Shelda Pal

## 2020-09-05 NOTE — ED Notes (Signed)
Report given to Adrian,RN. Pt is going to [facility] in short stay.

## 2020-09-05 NOTE — Transfer of Care (Signed)
Immediate Anesthesia Transfer of Care Note  Patient: [name]  Procedure(s) Performed: ARTHROPLASTY BIPOLAR HIP (HEMIARTHROPLASTY) (Left: Hip)  Patient Location: PACU  Anesthesia Type:General  Level of Consciousness: awake, alert  and patient cooperative  Airway & Oxygen Therapy: Patient Spontanous Breathing and Patient connected to face mask oxygen  Post-op Assessment: Report given to RN and Post -op Vital signs reviewed and stable  Post vital signs: Reviewed and stable  Last Vitals:  Vitals Value Taken Time  BP 134/88 09/05/20 1834  Temp    Pulse 105 09/05/20 1836  Resp 18 09/05/20 1836  SpO2 96 % 09/05/20 1836  Vitals shown include unvalidated device data.  Last Pain:  Vitals:   09/05/20 1534  TempSrc: Oral  PainSc: 0-No pain      Patients Stated Pain Goal: 1 (09/05/20 1534)  Complications: No notable events documented.

## 2020-09-05 NOTE — Consult Note (Signed)
Reason for Consult: left hip fracture Referring Physician: Ella Jubilee, MD  [name] is an 87 y.o. female.  HPI: Pertinent negatives include no shortness of breath. Patient presents after fall.  Reportedly fell this morning was unable to get up.  Complaining of pain in low back and left hip.  Also mild headache.  On Coumadin as anticoagulation for A. fib although reportedly has not had for the last 2 days because she supposed to have some dental procedure.  Has had other more frequent falls recently later states she has some pain in her right wrist but did not initially say that hurts and was not tender.  No syncope.  Was unable to get up because she is weak.  No loss of consciousness.  Past Medical History:  Diagnosis Date   Essential hypertension 09/04/2020   Peripheral polyneuropathy 09/04/2020   Permanent atrial fibrillation (HCC) 09/04/2020   Vitamin B12 deficiency 09/04/2020    History reviewed. No pertinent surgical history.  Family History  Problem Relation Age of Onset   Heart disease Neg Hx     Social History:  reports that she has never smoked. She has never used smokeless tobacco. She reports that she does not use drugs. No history on file for alcohol use.  Allergies:  Allergies  Allergen Reactions   Bactrim [Sulfamethoxazole-Trimethoprim] Nausea Only and Other (See Comments)    Headaches, also (per other chart in Epic)   Codeine Nausea Only    Per other chart in Epic   Contrast Media [Iodinated Diagnostic Agents] Other (See Comments)    Per other chart in Epic-  reaction not cited   Gabapentin Nausea And Vomiting and Other (See Comments)    Per the other chart in Epic: "The patient did not fell like herself, also."   Lactose Intolerance (Gi) Diarrhea and Nausea And Vomiting   Macrobid [Nitrofurantoin] Other (See Comments)    "Allergy" noted in other chart in Epic-  reaction not cited   Other Other (See Comments)    Poor air quality and seasonal allergies- Congestion,  itchy eyes, runny nose   Penicillins Nausea And Vomiting and Other (See Comments)    Reaction in other chart in Epic (N/V)   Tape Other (See Comments)    SKIN IS VERY THIN AND WILL TEAR AND BRUISE EASILY!!!    Medications: I have reviewed the patient's current medications. Scheduled:  metoprolol tartrate  100 mg Oral BID    Results for orders placed or performed during the hospital encounter of 09/04/20 (from the past 24 hour(s))  Comprehensive metabolic panel     Status: Abnormal   Collection Time: 09/04/20  6:20 PM  Result Value Ref Range   Sodium 139 135 - 145 mmol/L   Potassium 4.0 3.5 - 5.1 mmol/L   Chloride 103 98 - 111 mmol/L   CO2 25 22 - 32 mmol/L   Glucose, Bld 166 (H) 70 - 99 mg/dL   BUN 24 (H) 8 - 23 mg/dL   Creatinine, Ser 8.29 (H) 0.44 - 1.00 mg/dL   Calcium 9.2 8.9 - 56.2 mg/dL   Total Protein 7.1 6.5 - 8.1 g/dL   Albumin 3.8 3.5 - 5.0 g/dL   AST 47 (H) 15 - 41 U/L   ALT 43 0 - 44 U/L   Alkaline Phosphatase 127 (H) 38 - 126 U/L   Total Bilirubin 2.2 (H) 0.3 - 1.2 mg/dL   GFR, Estimated 52 (L) >60 mL/min   Anion gap 11 5 - 15  CBC  with Differential     Status: Abnormal   Collection Time: 09/04/20  6:20 PM  Result Value Ref Range   WBC 9.4 4.0 - 10.5 K/uL   RBC 4.45 3.87 - 5.11 MIL/uL   Hemoglobin 14.6 12.0 - 15.0 g/dL   HCT 46.0 36.0 - 46.0 %   MCV 103.4 (H) 80.0 - 100.0 fL   MCH 32.8 26.0 - 34.0 pg   MCHC 31.7 30.0 - 36.0 g/dL   RDW 13.6 11.5 - 15.5 %   Platelets 101 (L) 150 - 400 K/uL   nRBC 0.0 0.0 - 0.2 %   Neutrophils Relative % 87 %   Neutro Abs 8.0 (H) 1.7 - 7.7 K/uL   Lymphocytes Relative 6 %   Lymphs Abs 0.6 (L) 0.7 - 4.0 K/uL   Monocytes Relative 7 %   Monocytes Absolute 0.7 0.1 - 1.0 K/uL   Eosinophils Relative 0 %   Eosinophils Absolute 0.0 0.0 - 0.5 K/uL   Basophils Relative 0 %   Basophils Absolute 0.0 0.0 - 0.1 K/uL   Immature Granulocytes 0 %   Abs Immature Granulocytes 0.04 0.00 - 0.07 K/uL  CK     Status: None   Collection  Time: 09/04/20  6:20 PM  Result Value Ref Range   Total CK 155 38 - 234 U/L  Protime-INR     Status: Abnormal   Collection Time: 09/04/20  6:20 PM  Result Value Ref Range   Prothrombin Time 16.6 (H) 11.4 - 15.2 seconds   INR 1.3 (H) 0.8 - 1.2  Sample to Blood Bank     Status: None   Collection Time: 09/04/20  6:20 PM  Result Value Ref Range   Blood Bank Specimen SAMPLE AVAILABLE FOR TESTING    Sample Expiration      09/05/2020,2359 Performed at [facility], [address] [place], [place] [address]   Resp Panel by RT-PCR (Flu A&B, Covid) Nasopharyngeal Swab     Status: None   Collection Time: 09/04/20  6:51 PM   Specimen: Nasopharyngeal Swab; Nasopharyngeal(NP) swabs in vial transport medium  Result Value Ref Range   SARS Coronavirus 2 by RT PCR NEGATIVE NEGATIVE   Influenza A by PCR NEGATIVE NEGATIVE   Influenza B by PCR NEGATIVE NEGATIVE  Urinalysis, Routine w reflex microscopic Urine, Clean Catch     Status: Abnormal   Collection Time: 09/04/20 10:23 PM  Result Value Ref Range   Color, Urine AMBER (A) YELLOW   APPearance HAZY (A) CLEAR   Specific Gravity, Urine 1.015 1.005 - 1.030   pH 5.0 5.0 - 8.0   Glucose, UA NEGATIVE NEGATIVE mg/dL   Hgb urine dipstick NEGATIVE NEGATIVE   Bilirubin Urine NEGATIVE NEGATIVE   Ketones, ur NEGATIVE NEGATIVE mg/dL   Protein, ur NEGATIVE NEGATIVE mg/dL   Nitrite NEGATIVE NEGATIVE   Leukocytes,Ua NEGATIVE NEGATIVE  I-Stat venous blood gas, ED     Status: Abnormal   Collection Time: 09/05/20  4:26 AM  Result Value Ref Range   pH, Ven 7.422 7.250 - 7.430   pCO2, Ven 46.0 44.0 - 60.0 mmHg   pO2, Ven 130.0 (H) 32.0 - 45.0 mmHg   Bicarbonate 29.9 (H) 20.0 - 28.0 mmol/L   TCO2 31 22 - 32 mmol/L   O2 Saturation 99.0 %   Acid-Base Excess 5.0 (H) 0.0 - 2.0 mmol/L   Sodium 141 135 - 145 mmol/L   Potassium 3.5 3.5 - 5.1 mmol/L   Calcium, Ion 1.11 (L) 1.15 -   1.40 mmol/L   HCT 40.0 36.0 - 46.0 %   Hemoglobin 13.6 12.0 - 15.0 g/dL    Sample type VENOUS   Vitamin B12     Status: Abnormal   Collection Time: 09/05/20  4:34 AM  Result Value Ref Range   Vitamin B-12 935 (H) 180 - 914 pg/mL  Folate     Status: None   Collection Time: 09/05/20  4:34 AM  Result Value Ref Range   Folate 15.0 >5.9 ng/mL  CBC WITH DIFFERENTIAL     Status: Abnormal   Collection Time: 09/05/20  4:34 AM  Result Value Ref Range   WBC 8.6 4.0 - 10.5 K/uL   RBC 4.15 3.87 - 5.11 MIL/uL   Hemoglobin 13.6 12.0 - 15.0 g/dL   HCT 40.9 81.1 - 91.4 %   MCV 100.7 (H) 80.0 - 100.0 fL   MCH 32.8 26.0 - 34.0 pg   MCHC 32.5 30.0 - 36.0 g/dL   RDW 78.2 95.6 - 21.3 %   Platelets 89 (L) 150 - 400 K/uL   nRBC 0.0 0.0 - 0.2 %   Neutrophils Relative % 73 %   Neutro Abs 6.3 1.7 - 7.7 K/uL   Lymphocytes Relative 14 %   Lymphs Abs 1.2 0.7 - 4.0 K/uL   Monocytes Relative 12 %   Monocytes Absolute 1.1 (H) 0.1 - 1.0 K/uL   Eosinophils Relative 0 %   Eosinophils Absolute 0.0 0.0 - 0.5 K/uL   Basophils Relative 0 %   Basophils Absolute 0.0 0.0 - 0.1 K/uL   Immature Granulocytes 1 %   Abs Immature Granulocytes 0.04 0.00 - 0.07 K/uL  Comprehensive metabolic panel     Status: Abnormal   Collection Time: 09/05/20  4:34 AM  Result Value Ref Range   Sodium 139 135 - 145 mmol/L   Potassium 3.5 3.5 - 5.1 mmol/L   Chloride 102 98 - 111 mmol/L   CO2 27 22 - 32 mmol/L   Glucose, Bld 125 (H) 70 - 99 mg/dL   BUN 22 8 - 23 mg/dL   Creatinine, Ser 0.86 0.44 - 1.00 mg/dL   Calcium 9.0 8.9 - 57.8 mg/dL   Total Protein 6.3 (L) 6.5 - 8.1 g/dL   Albumin 3.2 (L) 3.5 - 5.0 g/dL   AST 32 15 - 41 U/L   ALT 34 0 - 44 U/L   Alkaline Phosphatase 109 38 - 126 U/L   Total Bilirubin 1.9 (H) 0.3 - 1.2 mg/dL   GFR, Estimated >46 >96 mL/min   Anion gap 10 5 - 15  I-Stat venous blood gas, ED     Status: Abnormal   Collection Time: 09/05/20  6:39 AM  Result Value Ref Range   pH, Ven 7.408 7.250 - 7.430   pCO2, Ven 50.7 44.0 - 60.0 mmHg   pO2, Ven 186.0 (H) 32.0 - 45.0 mmHg    Bicarbonate 32.0 (H) 20.0 - 28.0 mmol/L   TCO2 34 (H) 22 - 32 mmol/L   O2 Saturation 100.0 %   Acid-Base Excess 6.0 (H) 0.0 - 2.0 mmol/L   Sodium 141 135 - 145 mmol/L   Potassium 3.5 3.5 - 5.1 mmol/L   Calcium, Ion 1.10 (L) 1.15 - 1.40 mmol/L   HCT 41.0 36.0 - 46.0 %   Hemoglobin 13.9 12.0 - 15.0 g/dL   Sample type VENOUS     X-ray: CLINICAL DATA:  Status post fall   EXAM: PORTABLE PELVIS 1-2 VIEWS; DG HIP (WITH OR WITHOUT PELVIS) 2-3V  LEFT   COMPARISON:  None.   FINDINGS: Left femoral neck fracture. No left hip dislocation. Frontal view of the right hip with no definite displaced fracture. No acute displaced fracture or diastasis of the bones of the pelvis. Degenerative changes of the lumbar spine. Limited evaluation of the sacrum due to overlying bowel gas.   IMPRESSION: 1. Left femoral neck fracture. 2. No left hip dislocation. 3. No acute displaced fracture or dislocation of the right hip on frontal view. 4. No acute displaced fracture or diastasis of the bones of the pelvis.     Electronically Signed   By: Tish Frederickson M.D.  ROS As per HPI  Blood pressure 121/81, pulse 92, temperature 97.9 F (36.6 C), temperature source Oral, resp. rate 18, height 5\' 6"  (1.676 m), weight 71.7 kg, SpO2 97 %.  Physical Exam: HENT:     Head: Normocephalic and atraumatic.  Eyes:     Extraocular Movements: Extraocular movements intact.     Pupils: Pupils are equal, round, and reactive to light.  Cardiovascular:     Rate and Rhythm: Regular rhythm.  Musculoskeletal:     Cervical back: Neck supple.     Comments: At the lumbar tenderness.  Also posterior pelvis tenderness and bilateral hip tenderness.  Painful shortened and externally rotated LLE.  Neurovascular intact in bilateral feet.  Mild swelling of right wrist but minimal tenderness.  No cervical spine tenderness.  Skin:    General: Skin is warm.  Neurological:     Mental Status: She is alert and oriented to person,  place, and time.   Assessment/Plan: Displaced left hip femoral neck fracture  Plan: NPO Discussed managing her hip fracture with hemiarthroplasty Post op she likely be WBAT Hold chemoprophylaxis  Resume Coumadin 8/30  9/30 09/05/2020, 1:35 PM

## 2020-09-05 NOTE — Op Note (Signed)
NAME:  [name]                ACCOUNT NO.:  [id number]   MEDICAL RECORD NO.: [id number]   LOCATION:  1435                         FACILITY:  [facility]   DATE OF BIRTH:  1926/03/14  PHYSICIAN:  Madlyn Frankel. Charlann Boxer, M.D.     DATE OF PROCEDURE:  09/05/2020                               OPERATIVE REPORT     PREOPERATIVE DIAGNOSIS:  Left displaced femoral neck fracture.   POSTOPERATIVE DIAGNOSIS:  Left displaced femoral neck fracture.   PROCEDURE:  Left hip hemiarthroplasty utilizing DePuy component, size 6 Standard Actis with a 48 mm unipolar ball with a +0 adapter.   SURGEON:  Madlyn Frankel. Charlann Boxer, MD   ASSISTANT:  Rosalene Billings, PA-C.   ANESTHESIA:  General.   SPECIMENS:  None.   DRAINS:  None.   BLOOD LOSS:  < 100 cc.   COMPLICATIONS:  None.   INDICATION OF PROCEDURE:  [name] is a 87 year old female who lives independently.  She unfortunately had an unwitnessed ground level fall at her house.  She was brought to the ER due to inability to bear weight and subsequently admitted to the hospital after radiographs revealed a femoral neck fracture.  She was seen and evaluated and was scheduled for surgery for a left hip hemiarthroplasty.  The necessity of surgical repair was discussed with she and her family.  Consent was obtained after reviewing risks of infection, DVT, component failure, and need for revision surgery.   PROCEDURE IN DETAIL:  The patient was brought to the operative theater. Once adequate anesthesia, preoperative antibiotics, 2 g of Ancef administered, the patient was positioned into the right lateral decubitus position with the left side up.  The left lower extremity was then prepped and draped in sterile fashion.  A time-out was performed identifying the patient, planned procedure, and extremity.   A lateral incision was made off the proximal trochanter. Sharp dissection was carried down to the iliotibial band and gluteal fascia. The gluteal fascia  was then incised for posterior approach.  The short external rotators were taken down separate from the posterior capsule. An L capsulotomy was made preserving the posterior leaflet for later anatomic repair. Fracture site was identified and after removing comminuted segments of the posterior femoral neck, the femoral head was removed without difficulty and measured on the back table  using the sizing rings and determined to be 48 mm in diameter.   The proximal femur was then exposed.  Retractors placed.  I then drilled, opened the proximal femur.  Then I hand reamed once and  Irrigated the canal to try to prevent fat emboli.  I began broaching the femur with a starter broach up to a size 6 broach with good medial and lateral metaphyseal fit without evidence of any torsion or movement.  A trial reduction was carried out with a standard offset neck and a +0 adapter with a 48 mm ball.  The hip reduced nicely.  The leg lengths appeared to be equal compared to the down leg.   The hip went through a range of motion without evidence of any subluxation or impingement.   Given these findings, the trial components removed.  The final 6 Standard  Actis stem was opened.  After irrigating the canal, the final stem was impacted and sat at the level where the broach was. Based on this and the trial reduction, a +0 adapter was opened and impacted in the 48 mm unipolar ball onto a clean and dry trunnion.  The hip had been irrigated throughout the case and again at this point.  I re- Approximated the posterior capsule to the superior leaflet using a  #1 Vicryl.  The remainder of the wound was closed with #1 Vicryl in the iliotibial band and gluteal fascia, a  2-0 Vicryl in the sub-Q tissue and a running 4-0 Monocryl in the skin.  The hip was cleaned, dried, and dressed sterilely using Dermabond and Aquacel dressing.  She was then brought to recovery room, extubated in stable condition, tolerating the  procedure well.  Rosalene Billings, PA-C was present and utilized as Geophysicist/field seismologist for the entire case from  Preoperative positioning to management of the contralateral extremity and retractors to  General facilitation of the procedure.  He was also involved with primary wound closure.         Madlyn Frankel Charlann Boxer, M.D.

## 2020-09-06 ENCOUNTER — Other Ambulatory Visit: Payer: Self-pay

## 2020-09-06 ENCOUNTER — Encounter (HOSPITAL_COMMUNITY): Payer: Self-pay | Admitting: Orthopedic Surgery

## 2020-09-06 ENCOUNTER — Telehealth: Payer: Self-pay | Admitting: Family Medicine

## 2020-09-06 LAB — BASIC METABOLIC PANEL
Anion gap: 9 (ref 5–15)
BUN: 23 mg/dL (ref 8–23)
CO2: 26 mmol/L (ref 22–32)
Calcium: 8.5 mg/dL — ABNORMAL LOW (ref 8.9–10.3)
Chloride: 104 mmol/L (ref 98–111)
Creatinine, Ser: 0.91 mg/dL (ref 0.44–1.00)
GFR, Estimated: 60 mL/min — ABNORMAL LOW (ref >60)
Glucose, Bld: 153 mg/dL — ABNORMAL HIGH (ref 70–99)
Potassium: 3.9 mmol/L (ref 3.5–5.1)
Sodium: 139 mmol/L (ref 135–145)

## 2020-09-06 LAB — CBC
HCT: 37.4 % (ref 36.0–46.0)
Hemoglobin: 12.3 g/dL (ref 12.0–15.0)
MCH: 33.2 pg (ref 26.0–34.0)
MCHC: 32.9 g/dL (ref 30.0–36.0)
MCV: 100.8 fL — ABNORMAL HIGH (ref 80.0–100.0)
Platelets: 81 10*3/uL — ABNORMAL LOW (ref 150–400)
RBC: 3.71 MIL/uL — ABNORMAL LOW (ref 3.87–5.11)
RDW: 13.5 % (ref 11.5–15.5)
WBC: 8.8 10*3/uL (ref 4.0–10.5)
nRBC: 0 % (ref 0.0–0.2)

## 2020-09-06 LAB — GLUCOSE, CAPILLARY: Glucose-Capillary: 174 mg/dL — ABNORMAL HIGH (ref 70–99)

## 2020-09-06 MED ORDER — ADULT MULTIVITAMIN W/MINERALS CH
1.0000 | ORAL_TABLET | Freq: Every day | ORAL | Status: DC
  Administered 2020-09-06 – 2020-09-08 (×3): 1 via ORAL
  Filled 2020-09-06 (×3): qty 1

## 2020-09-06 MED ORDER — ENSURE ENLIVE PO LIQD
237.0000 mL | Freq: Two times a day (BID) | ORAL | Status: DC
  Administered 2020-09-06 – 2020-09-07 (×2): 237 mL via ORAL

## 2020-09-06 NOTE — Telephone Encounter (Signed)
Pt son call and want dr.Fry to give him a call concerning pt she is in the Hospital and he have some questions .

## 2020-09-06 NOTE — Progress Notes (Signed)
I & O cath done for 300 cc clear and amber urine.

## 2020-09-06 NOTE — TOC Initial Note (Signed)
Transition of Care [facility]) - Initial/Assessment Note    Patient Details  Name: [name] MRN: [id number] Date of Birth: [birth year]  [facility] CM/SW Contact:    Terrial Rhodes, LCSWA Phone Number: 09/06/2020, 5:10 PM  Clinical Narrative:                  CSW received consult for possible SNF placement at time of discharge. CSW spoke with patients son Jonny Ruiz regarding PT recommendation of SNF placement at time of discharge. Patients son reports patient comes from home alone. Patients son expressed understanding of PT recommendation and is agreeable to SNF placement  for patient at time of discharge. Patient son gave CSW permission to fax out initial referral near the [place] area. Patient has not received the COVID vaccines. No further questions reported at this time. CSW to continue to follow and assist with discharge planning needs.   Expected Discharge Plan: Skilled Nursing Facility Barriers to Discharge: Continued Medical Work up   Patient Goals and CMS Choice   CMS Medicare.gov Compare Post Acute Care list provided to:: Patient Represenative (must comment) (patients son Jonny Ruiz) Choice offered to / list presented to : Adult Children (patients son Jonny Ruiz)  Expected Discharge Plan and Services Expected Discharge Plan: Skilled Nursing Facility In-house Referral: Clinical Social Work     Living arrangements for the past 2 months: Single Family Home                                      Prior Living Arrangements/Services Living arrangements for the past 2 months: Single Family Home Lives with:: Self Patient language and need for interpreter reviewed:: Yes Do you feel safe going back to the place where you live?: No   SNF  Need for Family Participation in Patient Care: Yes (Comment) Care giver support system in place?: Yes (comment)   Criminal Activity/Legal Involvement Pertinent to Current Situation/Hospitalization: No - Comment as needed  Activities  of Daily Living Home Assistive Devices/Equipment: Cane (specify quad or straight) (straight cane) ADL Screening (condition at time of admission) Patient's cognitive ability adequate to safely complete daily activities?: Yes Is the patient deaf or have difficulty hearing?: No Does the patient have difficulty seeing, even when wearing glasses/contacts?: No Does the patient have difficulty concentrating, remembering, or making decisions?: No Patient able to express need for assistance with ADLs?: Yes Does the patient have difficulty dressing or bathing?: Yes Independently performs ADLs?: No Communication: Independent Dressing (OT): Independent Grooming: Independent Feeding: Independent Bathing: Independent Toileting: Needs assistance Is this a change from baseline?: Pre-admission baseline In/Out Bed: Needs assistance Is this a change from baseline?: Pre-admission baseline Walks in Home: Independent with device (comment) (cane) Does the patient have difficulty walking or climbing stairs?: Yes Weakness of Legs: Both Weakness of Arms/Hands: None  Permission Sought/Granted Permission sought to share information with : Case Manager, Family Supports, Oceanographer granted to share information with : No  Share Information with NAME: Patient currently confused spoke with patients son Jonny Ruiz  Permission granted to share info w AGENCY: Patient currently confused spoke with patients son John/ SNF  Permission granted to share info w Relationship: Patient currently confused spoke with patients son Jonny Ruiz  Permission granted to share info w Contact Information: Patient currently confused spoke with patients son Jonny Ruiz [phone]  Emotional Assessment       Orientation: : Oriented to Self, Oriented to  Place, Oriented to  Time, Oriented to Situation (currently confused but initially oriented x 4 spoke with son) Alcohol / Substance Use: Not Applicable Psych Involvement: No  (comment)  Admission diagnosis:  Fracture of femoral neck, left, closed (HCC) [S72.002A] Closed fracture of left hip, initial encounter (HCC) [S72.002A] Fall, initial encounter [W19.XXXA] Patient Active Problem List   Diagnosis Date Noted   S/P left hip hemiarthroplasty 09/05/2020   Fracture of femoral neck, left, closed (HCC) 09/04/2020   Permanent atrial fibrillation (HCC) 09/04/2020   Peripheral polyneuropathy 09/04/2020   Herniation of intervertebral disc at C4-C5 level 09/04/2020   Essential hypertension 09/04/2020   Vitamin B12 deficiency 09/04/2020   Right renal mass 09/04/2020   Acute urinary retention 09/04/2020   Chronic systolic CHF (congestive heart failure) (HCC) 09/04/2020   Acute respiratory failure with hypoxia (HCC) 09/04/2020   PCP:  [name], MD Pharmacy:   [facility] [address], [place] - [address] [place] [address] Phone: [phone] Fax: [phone]     Social Determinants of Health (SDOH) Interventions    Readmission Risk Interventions No flowsheet data found.

## 2020-09-06 NOTE — Progress Notes (Signed)
Initial Nutrition Assessment  DOCUMENTATION CODES:   Not applicable  INTERVENTION:   Ensure Enlive po BID, each supplement provides 350 kcal and 20 grams of protein MVI with minerals daily  NUTRITION DIAGNOSIS:   Increased nutrient needs related to hip fracture as evidenced by estimated needs.  GOAL:   Patient will meet greater than or equal to 90% of their needs  MONITOR:   PO intake, Supplement acceptance  REASON FOR ASSESSMENT:   Consult Hip fracture protocol  ASSESSMENT:   87 yo female admitted with L hip fracture. PMH includes peripheral polyneuropathy, HTN, vitamin B-12 deficiency, A fib, CHF.  S/P left hemiarthroplasty 8/29. Diet advanced to regular after surgery yesterday. Meal intakes not recorded.  Labs reviewed.  Medications reviewed and include Colace.   RD working remotely. Attempted to reach patient by phone call, but unsuccessful. No recent weights available for review. No nutrition problems identified on admission nutrition screen. Patient would benefit from a PO supplement to help meet increased nutrient needs s/p surgery.  NUTRITION - FOCUSED PHYSICAL EXAM:  Unable to complete  Diet Order:   Diet Order             Diet regular Room service appropriate? Yes; Fluid consistency: Thin  Diet effective now                   EDUCATION NEEDS:   Not appropriate for education at this time  Skin:  Skin Assessment: Reviewed RN Assessment  Last BM:  8/28 type 5  Height:   Ht Readings from Last 1 Encounters:  09/04/20 5\' 6"  (1.676 m)    Weight:   Wt Readings from Last 1 Encounters:  09/04/20 71.7 kg    Ideal Body Weight:  59.1 kg  BMI:  Body mass index is 25.5 kg/m.  Estimated Nutritional Needs:   Kcal:  1600-1800  Protein:  80-90 gm  Fluid:  1.6-1.8 L    09/06/20, RD, LDN, CNSC Please refer to Amion for contact information.

## 2020-09-06 NOTE — Progress Notes (Signed)
Verbal order for take pt off bedrest given by Dr. Cipriano Bunker.

## 2020-09-06 NOTE — Progress Notes (Signed)
Patient ID: [name], female   DOB: 1926/07/20, 87 y.o.   MRN: [id number] Subjective: 1 Day Post-Op Procedure(s) (LRB): ARTHROPLASTY BIPOLAR HIP (HEMIARTHROPLASTY) (Left)    Patient reports pain as mild. No events over night noted.  Objective:   VITALS:   Vitals:   09/05/20 2254 09/06/20 0520  BP: 136/87 116/85  Pulse: 99   Resp: 16   Temp: 98 F (36.7 C) 98.4 F (36.9 C)  SpO2:      Neurovascular intact Incision: dressing C/D/I - left hip  LABS Recent Labs    09/04/20 1820 09/05/20 0426 09/05/20 0434 09/05/20 0639 09/06/20 0129  HGB 14.6   < > 13.6 13.9 12.3  HCT 46.0   < > 41.8 41.0 37.4  WBC 9.4  --  8.6  --  8.8  PLT 101*  --  89*  --  81*   < > = values in this interval not displayed.    Recent Labs    09/04/20 1820 09/05/20 0426 09/05/20 0434 09/05/20 0639 09/06/20 0129  NA 139   < > 139 141 139  K 4.0   < > 3.5 3.5 3.9  BUN 24*  --  22  --  23  CREATININE 1.02*  --  0.86  --  0.91  GLUCOSE 166*  --  125*  --  153*   < > = values in this interval not displayed.    Recent Labs    09/04/20 1820  INR 1.3*     Assessment/Plan: 1 Day Post-Op Procedure(s) (LRB): ARTHROPLASTY BIPOLAR HIP (HEMIARTHROPLASTY) (Left)   Advance diet Up with therapy - WBAT Disposition pending assessment with PT - mobility and safety assessment  If issues or concerns arise regarding findings related to her axial spine then NSU can be consulted for evaluation and recommendations   Otherwise RTC in 2 weeks to see [facility] for wound check and routine follow up of her left hip fracture May resume coumadin as DVT prophylaxis

## 2020-09-06 NOTE — Progress Notes (Signed)
PROGRESS NOTE    [name]  ION:[id number] DOB: Jun 01, 1926 DOA: 09/04/2020 PCP: [name], MD   Brief Narrative:  This 87 years old female with PMH significant for systolic heart failure, atrial fibrillation, hypertension, B12 deficiency, benign positional vertigo presented s/p mechanical fall.  Apparently patient had an unwitnessed fall.  She was found by her son with severe pain in her left lower extremity and left hip.  She was brought in the ED.  x-ray shows left femoral neck fracture, no left hip dislocation.  CT head chronic ischemic microangiopathy,  no fracture or subluxation of cervical spine.  Cervical spine MRI no cord compression. Patient is admitted s/p fall with femoral neck fracture.  S/p ORIF.  Assessment & Plan:   Principal Problem:   Fracture of femoral neck, left, closed (HCC) Active Problems:   Permanent atrial fibrillation (HCC)   Peripheral polyneuropathy   Herniation of intervertebral disc at C4-C5 level   Essential hypertension   Vitamin B12 deficiency   Right renal mass   Acute urinary retention   Chronic systolic CHF (congestive heart failure) (HCC)   Acute respiratory failure with hypoxia (HCC)   S/P left hip hemiarthroplasty  Left femoral neck fracture: Patient presented s/p fall, x-ray shows left femoral neck fracture. Ortho consulted, patient underwent left hip hemiarthroplasty tolerated well. Continue adequate pain control.  Weightbearing as tolerated. PT and OT recommended skilled nursing. Continue DVT prophylaxis with Lovenox. Outpatient follow-up with Dr. Charlann Boxer in 2 weeks.  Acute urinary retention: Patient has developed urinary retention.  Foley catheter has been placed. Attempt voiding trial.  Permanent atrial fibrillation: Heart rate controlled with metoprolol, Patient on warfarin at home. Resume Coumadin from tonight.  Chronic systolic heart failure: No signs of clinical volume overload. Recent echocardiogram LV EF 35%.   Severe hypokinesis of septal wall and inferior lateral wall.  DVT prophylaxis: Lovenox Code Status: DNR Family Communication: No family at bedside Disposition Plan:   Status is: Inpatient  Remains inpatient appropriate because:Inpatient level of care appropriate due to severity of illness  Dispo: The patient is from: Home              Anticipated d/c is to: SNF              Patient currently is not medically stable to d/c.   Difficult to place patient No   Consultants:  Orthopedics  Procedures: S/p left hip hemiarthroplasty Antimicrobials:   Anti-infectives (From admission, onward)    Start     Dose/Rate Route Frequency Ordered Stop   09/06/20 0600  ceFAZolin (ANCEF) IVPB 2g/100 mL premix        2 g 200 mL/hr over 30 Minutes Intravenous On call to O.R. 09/05/20 1511 09/06/20 0538   09/05/20 2100  ceFAZolin (ANCEF) IVPB 2g/100 mL premix        2 g 200 mL/hr over 30 Minutes Intravenous Every 6 hours 09/05/20 2011 09/06/20 0538        Subjective: Patient was seen and examined at bedside.  Overnight events noted.   Patient is lying comfortably on the bed , denies any pain in the neck or in the back.   Dressing noted in the left hip area.  Objective: Vitals:   09/06/20 0520 09/06/20 0812 09/06/20 0845 09/06/20 1229  BP: 116/85 (!) 119/91 121/87 107/80  Pulse:  98 96 82  Resp:  (!) 21  16  Temp: 98.4 F (36.9 C) 98.4 F (36.9 C)  98.4 F (36.9 C)  TempSrc:  Oral Oral  Oral  SpO2:  99%  96%  Weight:      Height:        Intake/Output Summary (Last 24 hours) at 09/06/2020 1335 Last data filed at 09/06/2020 0840 Gross per 24 hour  Intake 1113.95 ml  Output 300 ml  Net 813.95 ml   Filed Weights   09/04/20 1858  Weight: 71.7 kg    Examination:  General exam: Appears comfortable, not in any acute distress. Respiratory system: Clear to auscultation. Respiratory effort normal. Cardiovascular system: S1 & S2 heard, RRR. No JVD, murmurs, rubs, gallops or clicks. No  pedal edema. Gastrointestinal system: Abdomen is nondistended, soft and nontender.. Normal bowel sounds heard. Central nervous system: Alert and oriented X2. No focal neurological deficits. Extremities: Left hip : mild tenderness noted, dressing noted, able to move legs. Skin: No rashes, lesions or ulcers Psychiatry: Judgement and insight appear normal. Mood & affect appropriate.     Data Reviewed: I have personally reviewed following labs and imaging studies  CBC: Recent Labs  Lab 09/04/20 1820 09/05/20 0426 09/05/20 0434 09/05/20 0639 09/06/20 0129  WBC 9.4  --  8.6  --  8.8  NEUTROABS 8.0*  --  6.3  --   --   HGB 14.6 13.6 13.6 13.9 12.3  HCT 46.0 40.0 41.8 41.0 37.4  MCV 103.4*  --  100.7*  --  100.8*  PLT 101*  --  89*  --  81*   Basic Metabolic Panel: Recent Labs  Lab 09/04/20 1820 09/05/20 0426 09/05/20 0434 09/05/20 0639 09/06/20 0129  NA 139 141 139 141 139  K 4.0 3.5 3.5 3.5 3.9  CL 103  --  102  --  104  CO2 25  --  27  --  26  GLUCOSE 166*  --  125*  --  153*  BUN 24*  --  22  --  23  CREATININE 1.02*  --  0.86  --  0.91  CALCIUM 9.2  --  9.0  --  8.5*   GFR: Estimated Creatinine Clearance: 40.9 mL/min (by C-G formula based on SCr of 0.91 mg/dL). Liver Function Tests: Recent Labs  Lab 09/04/20 1820 09/05/20 0434  AST 47* 32  ALT 43 34  ALKPHOS 127* 109  BILITOT 2.2* 1.9*  PROT 7.1 6.3*  ALBUMIN 3.8 3.2*   No results for input(s): LIPASE, AMYLASE in the last 168 hours. No results for input(s): AMMONIA in the last 168 hours. Coagulation Profile: Recent Labs  Lab 09/04/20 1820  INR 1.3*   Cardiac Enzymes: Recent Labs  Lab 09/04/20 1820  CKTOTAL 155   BNP (last 3 results) No results for input(s): PROBNP in the last 8760 hours. HbA1C: No results for input(s): HGBA1C in the last 72 hours. CBG: No results for input(s): GLUCAP in the last 168 hours. Lipid Profile: No results for input(s): CHOL, HDL, LDLCALC, TRIG, CHOLHDL, LDLDIRECT in  the last 72 hours. Thyroid Function Tests: No results for input(s): TSH, T4TOTAL, FREET4, T3FREE, THYROIDAB in the last 72 hours. Anemia Panel: Recent Labs    09/05/20 0434  VITAMINB12 935*  FOLATE 15.0   Sepsis Labs: No results for input(s): PROCALCITON, LATICACIDVEN in the last 168 hours.  Recent Results (from the past 240 hour(s))  Resp Panel by RT-PCR (Flu A&B, Covid) Nasopharyngeal Swab     Status: None   Collection Time: 09/04/20  6:51 PM   Specimen: Nasopharyngeal Swab; Nasopharyngeal(NP) swabs in vial transport medium  Result Value Ref Range Status  SARS Coronavirus 2 by RT PCR NEGATIVE NEGATIVE Final    Comment: (NOTE) SARS-CoV-2 target nucleic acids are NOT DETECTED.  The SARS-CoV-2 RNA is generally detectable in upper respiratory specimens during the acute phase of infection. The lowest concentration of SARS-CoV-2 viral copies this assay can detect is 138 copies/mL. A negative result does not preclude SARS-Cov-2 infection and should not be used as the sole basis for treatment or other patient management decisions. A negative result may occur with  improper specimen collection/handling, submission of specimen other than nasopharyngeal swab, presence of viral mutation(s) within the areas targeted by this assay, and inadequate number of viral copies(<138 copies/mL). A negative result must be combined with clinical observations, patient history, and epidemiological information. The expected result is Negative.  Fact Sheet for Patients:  BloggerCourse.comhttps://www.fda.gov/media/152166/download  Fact Sheet for Healthcare Providers:  SeriousBroker.ithttps://www.fda.gov/media/152162/download  This test is no t yet approved or cleared by the [place] FDA and  has been authorized for detection and/or diagnosis of SARS-CoV-2 by FDA under an Emergency Use Authorization (EUA). This EUA will remain  in effect (meaning this test can be used) for the duration of the COVID-19 declaration under Section  564(b)(1) of the Act, 21 U.S.C.section 360bbb-3(b)(1), unless the authorization is terminated  or revoked sooner.       Influenza A by PCR NEGATIVE NEGATIVE Final   Influenza B by PCR NEGATIVE NEGATIVE Final    Comment: (NOTE) The Xpert Xpress SARS-CoV-2/FLU/RSV plus assay is intended as an aid in the diagnosis of influenza from Nasopharyngeal swab specimens and should not be used as a sole basis for treatment. Nasal washings and aspirates are unacceptable for Xpert Xpress SARS-CoV-2/FLU/RSV testing.  Fact Sheet for Patients: BloggerCourse.comhttps://www.fda.gov/media/152166/download  Fact Sheet for Healthcare Providers: SeriousBroker.ithttps://www.fda.gov/media/152162/download  This test is not yet approved or cleared by the [place] FDA and has been authorized for detection and/or diagnosis of SARS-CoV-2 by FDA under an Emergency Use Authorization (EUA). This EUA will remain in effect (meaning this test can be used) for the duration of the COVID-19 declaration under Section 564(b)(1) of the Act, 21 U.S.C. section 360bbb-3(b)(1), unless the authorization is terminated or revoked.  Performed at [facility], [address] [place], [place] [address]   Surgical pcr screen     Status: Abnormal   Collection Time: 09/05/20  3:09 PM   Specimen: Nasal Mucosa; Nasal Swab  Result Value Ref Range Status   MRSA, PCR NEGATIVE NEGATIVE Final   Staphylococcus aureus POSITIVE (A) NEGATIVE Final    Comment: (NOTE) The Xpert SA Assay (FDA approved for NASAL specimens in patients 85 years of age and older), is one component of a comprehensive surveillance program. It is not intended to diagnose infection nor to guide or monitor treatment. Performed at [facility], [address] [place], [place] [address]          Radiology Studies: DG Wrist Complete Right  Result Date: 09/04/2020 CLINICAL DATA:  Status post fall. EXAM: RIGHT WRIST - COMPLETE 3+ VIEW COMPARISON:  None. FINDINGS: Chronic  fracture deformities are seen involving the distal right radius and right ulnar styloid. There is no evidence of dislocation. Diffuse osteopenia is seen. Degenerative changes are noted throughout the right wrist. There is diffuse soft tissue swelling. IMPRESSION: Chronic fracture deformities of the distal right radius and right ulnar styloid. Electronically Signed   By: Aram Candelahaddeus  Houston M.D.   On: 09/04/2020 21:45   CT HEAD WO CONTRAST (5MM)  Result Date: 09/04/2020 CLINICAL DATA:  Fall EXAM: CT HEAD WITHOUT CONTRAST CT CERVICAL SPINE WITHOUT CONTRAST TECHNIQUE: Multidetector CT imaging of the head and cervical spine was performed following the standard protocol without intravenous contrast. Multiplanar CT image reconstructions of the cervical spine were also generated. COMPARISON:  None. FINDINGS: CT HEAD FINDINGS Brain: There is no mass, hemorrhage or extra-axial collection. There is generalized atrophy without lobar predilection. There is hypoattenuation of the periventricular white matter, most commonly indicating chronic ischemic microangiopathy. Vascular: No abnormal hyperdensity of the major intracranial arteries or dural venous sinuses. No intracranial atherosclerosis. Skull: The visualized skull base, calvarium and extracranial soft tissues are normal. Sinuses/Orbits: No fluid levels or advanced mucosal thickening of the visualized paranasal sinuses. No mastoid or middle ear effusion. The orbits are normal. CT CERVICAL SPINE FINDINGS Alignment: No static subluxation. Facets are aligned. Occipital condyles are normally positioned. Skull base and vertebrae: No acute fracture. Soft tissues and spinal canal: No prevertebral fluid or swelling. No visible canal hematoma. Disc levels: Large central disc protrusion at C4-5 indenting the ventral spinal cord. Upper chest: No pneumothorax, pulmonary nodule or pleural effusion. Other: Normal visualized paraspinal cervical soft tissues. IMPRESSION: 1. Chronic  ischemic microangiopathy and generalized atrophy without acute intracranial abnormality. 2. No acute fracture or static subluxation of the cervical spine. 3. Large central disc protrusion at C4-5 indenting the ventral spinal cord. Electronically Signed   By: Deatra Robinson M.D.   On: 09/04/2020 20:03   CT Cervical Spine Wo Contrast  Result Date: 09/04/2020 CLINICAL DATA:  Fall EXAM: CT HEAD WITHOUT CONTRAST CT CERVICAL SPINE WITHOUT CONTRAST TECHNIQUE: Multidetector CT imaging of the head and cervical spine was performed following the standard protocol without intravenous contrast. Multiplanar CT image reconstructions of the cervical spine were also generated. COMPARISON:  None. FINDINGS: CT HEAD FINDINGS Brain: There is no mass, hemorrhage or extra-axial collection. There is generalized atrophy without lobar predilection. There is hypoattenuation of the periventricular white matter, most commonly indicating chronic ischemic microangiopathy. Vascular: No abnormal hyperdensity of the major intracranial arteries or dural venous sinuses. No intracranial atherosclerosis. Skull: The visualized skull base, calvarium and extracranial soft tissues are normal. Sinuses/Orbits: No fluid levels or advanced mucosal thickening of the visualized paranasal sinuses. No mastoid or middle ear effusion. The orbits are normal. CT CERVICAL SPINE FINDINGS Alignment: No static subluxation. Facets are aligned. Occipital condyles are normally positioned. Skull base and vertebrae: No acute fracture. Soft tissues and spinal canal: No prevertebral fluid or swelling. No visible canal hematoma. Disc levels: Large central disc protrusion at C4-5 indenting the ventral spinal cord. Upper chest: No pneumothorax, pulmonary nodule or pleural effusion. Other: Normal visualized paraspinal cervical soft tissues. IMPRESSION: 1. Chronic ischemic microangiopathy and generalized atrophy without acute intracranial abnormality. 2. No acute fracture or static  subluxation of the cervical spine. 3. Large central disc protrusion at C4-5 indenting the ventral spinal cord. Electronically Signed   By: Deatra Robinson M.D.   On: 09/04/2020 20:03   CT Lumbar Spine Wo Contrast  Result Date: 09/04/2020 CLINICAL DATA:  Fall EXAM: CT LUMBAR SPINE WITHOUT CONTRAST TECHNIQUE: Multidetector CT imaging of the lumbar spine was performed without intravenous contrast administration. Multiplanar CT image reconstructions were also generated. COMPARISON:  None. FINDINGS: Segmentation: 5 lumbar type vertebrae. Alignment: Normal. Vertebrae: No acute fracture or focal pathologic process. Paraspinal and other soft tissues: Calcific aortic atherosclerosis. Intermediate density right renal exophytic lesion measuring 3.0 cm. Disc levels: Multilevel degenerative disc disease without spinal canal or neural foraminal stenosis. IMPRESSION: 1. No acute fracture  or static subluxation of the lumbar spine. 2. Multilevel degenerative disc disease without spinal canal or neural foraminal stenosis. 3. Intermediate density right renal exophytic lesion measuring 3.0 cm. Multiphase renal mass protocol CT or MRI of the abdomen with and without contrast is recommended for complete characterization to exclude renal carcinoma and could be obtained non-emergently. Aortic Atherosclerosis (ICD10-I70.0). Electronically Signed   By: Deatra Robinson M.D.   On: 09/04/2020 19:59   MR CERVICAL SPINE W WO CONTRAST  Result Date: 09/05/2020 CLINICAL DATA:  Cord compression EXAM: MRI CERVICAL SPINE WITHOUT AND WITH CONTRAST TECHNIQUE: Multiplanar and multiecho pulse sequences of the cervical spine, to include the craniocervical junction and cervicothoracic junction, were obtained without and with intravenous contrast. CONTRAST:  7mL GADAVIST GADOBUTROL 1 MMOL/ML IV SOLN COMPARISON:  None. FINDINGS: Alignment: Physiologic. Vertebrae: No fracture, evidence of discitis, or bone lesion. Cord: Normal signal and morphology.  Posterior Fossa, vertebral arteries, paraspinal tissues: Negative. Disc levels: C1-2: Unremarkable. C2-3: Normal disc space and facet joints. There is no spinal canal stenosis. No neural foraminal stenosis. C3-4: Normal disc space and facet joints. There is no spinal canal stenosis. No neural foraminal stenosis. C4-5: Intermediate sized central disc extrusion with inferior migration effacing the ventral thecal sac and contacting the spinal cord. Mild spinal canal stenosis. No neural foraminal stenosis. C5-6: Small disc bulge with uncovertebral spurring. There is no spinal canal stenosis. Moderate bilateral neural foraminal stenosis. C6-7: Small central disc protrusion. There is no spinal canal stenosis. No neural foraminal stenosis. C7-T1: Normal disc space and facet joints. There is no spinal canal stenosis. No neural foraminal stenosis. IMPRESSION: 1. No cord compression. 2. Mild spinal canal stenosis at C4-5 secondary to intermediate sized central disc extrusion with inferior migration. 3. Moderate bilateral neural foraminal stenosis at C5-6. Electronically Signed   By: Deatra Robinson M.D.   On: 09/05/2020 03:18   DG Pelvis Portable  Result Date: 09/05/2020 CLINICAL DATA:  Status post hip arthroplasty EXAM: PORTABLE PELVIS 1-2 VIEWS COMPARISON:  09/04/2020 FINDINGS: Interval left hip replacement with intact hardware and normal alignment. Gas in the soft tissues consistent with recent surgery IMPRESSION: Left hip arthroplasty with expected postsurgical change Electronically Signed   By: Jasmine Pang M.D.   On: 09/05/2020 20:33   DG Pelvis Portable  Result Date: 09/04/2020 CLINICAL DATA:  Status post fall EXAM: PORTABLE PELVIS 1-2 VIEWS; DG HIP (WITH OR WITHOUT PELVIS) 2-3V LEFT COMPARISON:  None. FINDINGS: Left femoral neck fracture. No left hip dislocation. Frontal view of the right hip with no definite displaced fracture. No acute displaced fracture or diastasis of the bones of the pelvis. Degenerative  changes of the lumbar spine. Limited evaluation of the sacrum due to overlying bowel gas. IMPRESSION: 1. Left femoral neck fracture. 2. No left hip dislocation. 3. No acute displaced fracture or dislocation of the right hip on frontal view. 4. No acute displaced fracture or diastasis of the bones of the pelvis. Electronically Signed   By: Tish Frederickson M.D.   On: 09/04/2020 19:28   DG Chest Portable 1 View  Result Date: 09/04/2020 CLINICAL DATA:  Fall. EXAM: PORTABLE CHEST 1 VIEW COMPARISON:  February 02, 2011 FINDINGS: No pneumothorax. Stable cardiomegaly. The hila and mediastinum are unremarkable. Kerley B-lines are identified in the periphery of the left lung suggesting mild edema. No suspicious nodule, mass, or focal infiltrate. IMPRESSION: Cardiomegaly and probable mild pulmonary edema. No other acute abnormalities are identified. Electronically Signed   By: Gerome Sam III M.D.  On: 09/04/2020 19:28   ECHOCARDIOGRAM COMPLETE  Result Date: 09/05/2020    ECHOCARDIOGRAM REPORT   Patient Name:   JALAINA SALYERS Date of Exam: 09/05/2020 Medical Rec #:  [id number]        Height:       66.0 in Accession #:    [id number]       Weight:       158.0 lb Date of Birth:  1926-11-23        BSA:          1.809 m Patient Age:    89 years         BP:           106/65 mmHg Patient Gender: F                HR:           95 bpm. Exam Location:  Inpatient Procedure: 2D Echo, Cardiac Doppler and Color Doppler Indications:    CHF  History:        Patient has no prior history of Echocardiogram examinations.                 CHF, Arrythmias:Atrial Fibrillation; Risk Factors:Hypertension.  Sonographer:    Neomia Dear RDCS Referring Phys: [id number] Deno Lunger SHAL[place] IMPRESSIONS  1. Left ventricular ejection fraction, by estimation, is 35%. The left ventricle has moderately decreased function. The left ventricle demonstrates regional wall motion abnormalities with severe hypokinesis of the septal wall and the inferolateral  wall.  There is mild left ventricular hypertrophy. Left ventricular diastolic parameters are indeterminate.  2. Right ventricular systolic function is mildly reduced. The right ventricular size is moderately enlarged. There is severely elevated pulmonary artery systolic pressure. The estimated right ventricular systolic pressure is 72.2 mmHg.  3. Left atrial size was moderately dilated.  4. Right atrial size was severely dilated.  5. The mitral valve is normal in structure. Trivial mitral valve regurgitation. No evidence of mitral stenosis.  6. Tricuspid valve regurgitation is moderate.  7. The aortic valve is tricuspid. Aortic valve regurgitation is trivial. Mild aortic valve sclerosis is present, with no evidence of aortic valve stenosis.  8. The inferior vena cava is dilated in size with <50% respiratory variability, suggesting right atrial pressure of 15 mmHg. FINDINGS  Left Ventricle: Left ventricular ejection fraction, by estimation, is 35%. The left ventricle has moderately decreased function. The left ventricle demonstrates regional wall motion abnormalities. The left ventricular internal cavity size was normal in size. There is mild left ventricular hypertrophy. Left ventricular diastolic parameters are indeterminate. Right Ventricle: The right ventricular size is moderately enlarged. No increase in right ventricular wall thickness. Right ventricular systolic function is mildly reduced. There is severely elevated pulmonary artery systolic pressure. The tricuspid regurgitant velocity is 3.78 m/s, and with an assumed right atrial pressure of 15 mmHg, the estimated right ventricular systolic pressure is 72.2 mmHg. Left Atrium: Left atrial size was moderately dilated. Right Atrium: Right atrial size was severely dilated. Pericardium: Trivial pericardial effusion is present. Mitral Valve: The mitral valve is normal in structure. Trivial mitral valve regurgitation. No evidence of mitral valve stenosis. Tricuspid  Valve: The tricuspid valve is normal in structure. Tricuspid valve regurgitation is moderate. Aortic Valve: The aortic valve is tricuspid. Aortic valve regurgitation is trivial. Aortic regurgitation PHT measures 406 msec. Mild aortic valve sclerosis is present, with no evidence of aortic valve stenosis. Aortic valve mean gradient measures 3.5 mmHg. Aortic valve peak gradient measures 6.9 mmHg.  Aortic valve area, by VTI measures 2.41 cm. Pulmonic Valve: The pulmonic valve was normal in structure. Pulmonic valve regurgitation is trivial. Aorta: The aortic root is normal in size and structure. Venous: The inferior vena cava is dilated in size with less than 50% respiratory variability, suggesting right atrial pressure of 15 mmHg. IAS/Shunts: No atrial level shunt detected by color flow Doppler.  LEFT VENTRICLE PLAX 2D LVIDd:         5.00 cm LVIDs:         4.70 cm LV PW:         1.40 cm LV IVS:        0.90 cm LVOT diam:     2.50 cm LV SV:         58 LV SV Index:   32 LVOT Area:     4.91 cm  LV Volumes (MOD) LV vol d, MOD A2C: 43.0 ml LV vol d, MOD A4C: 59.3 ml LV vol s, MOD A2C: 22.8 ml LV vol s, MOD A4C: 43.4 ml LV SV MOD A2C:     20.2 ml LV SV MOD A4C:     59.3 ml LV SV MOD BP:      22.8 ml RIGHT VENTRICLE RV Basal diam:  4.60 cm RV Mid diam:    2.60 cm TAPSE (M-mode): 1.1 cm LEFT ATRIUM             Index       RIGHT ATRIUM           Index LA Vol (A2C):   31.4 ml 17.36 ml/m RA Area:     28.50 cm LA Vol (A4C):   81.8 ml 45.21 ml/m RA Volume:   93.60 ml  51.74 ml/m LA Biplane Vol: 50.7 ml 28.02 ml/m  AORTIC VALVE                   PULMONIC VALVE AV Area (Vmax):    2.55 cm    PV Vmax:          0.85 m/s AV Area (Vmean):   2.68 cm    PV Vmean:         63.100 cm/s AV Area (VTI):     2.41 cm    PV VTI:           0.170 m AV Vmax:           131.00 cm/s PV Peak grad:     2.9 mmHg AV Vmean:          93.300 cm/s PV Mean grad:     2.0 mmHg AV VTI:            0.240 m     PR End Diast Vel: 3.66 msec AV Peak Grad:      6.9  mmHg AV Mean Grad:      3.5 mmHg LVOT Vmax:         68.00 cm/s LVOT Vmean:        50.900 cm/s LVOT VTI:          0.118 m LVOT/AV VTI ratio: 0.49 AI PHT:            406 msec  AORTA Ao Root diam: 3.30 cm Ao Asc diam:  3.60 cm MITRAL VALVE               TRICUSPID VALVE MV Area (PHT): 5.66 cm    TR Peak grad:   57.2 mmHg MV Decel Time: 134 msec  TR Vmax:        378.00 cm/s MR PISA:        0.57 cm MR PISA Radius: 0.30 cm    SHUNTS MV E velocity: 76.70 cm/s  Systemic VTI:  0.12 m                            Systemic Diam: 2.50 cm Dalton McleanMD Electronically signed by Wilfred Lacy Signature Date/Time: 09/05/2020/2:02:53 PM    Final    DG Hip Unilat W or Wo Pelvis 2-3 Views Left  Result Date: 09/04/2020 CLINICAL DATA:  Status post fall EXAM: PORTABLE PELVIS 1-2 VIEWS; DG HIP (WITH OR WITHOUT PELVIS) 2-3V LEFT COMPARISON:  None. FINDINGS: Left femoral neck fracture. No left hip dislocation. Frontal view of the right hip with no definite displaced fracture. No acute displaced fracture or diastasis of the bones of the pelvis. Degenerative changes of the lumbar spine. Limited evaluation of the sacrum due to overlying bowel gas. IMPRESSION: 1. Left femoral neck fracture. 2. No left hip dislocation. 3. No acute displaced fracture or dislocation of the right hip on frontal view. 4. No acute displaced fracture or diastasis of the bones of the pelvis. Electronically Signed   By: Tish Frederickson M.D.   On: 09/04/2020 19:28     Scheduled Meds:  aspirin  81 mg Oral BID   Chlorhexidine Gluconate Cloth  6 each Topical Q0600   docusate sodium  100 mg Oral BID   feeding supplement  237 mL Oral BID BM   loratadine  10 mg Oral Daily   metoprolol tartrate  100 mg Oral BID   multivitamin with minerals  1 tablet Oral Daily   mupirocin ointment  1 application Nasal BID   Continuous Infusions:  sodium chloride 75 mL/hr at 09/06/20 1237   methocarbamol (ROBAXIN) IV       LOS: 2 days    Time spent: 35  mins    Avyonna Wagoner, MD [facility]   If 7PM-7AM, please contact night-coverage

## 2020-09-06 NOTE — Progress Notes (Signed)
Physical Therapy Treatment Patient Details Name: [name] MRN: [id number] DOB: [birth year] Today's Date: 09/06/2020    History of Present Illness 87 year old female presents to [facility] emergency department via EMS after being found status post fall by her son. Admitted 8/28 and underwent 8/29 L hip hemiarthroplasty, CT head chronic ischemic microangiopathy. Cervical spine MRI no cord compression PMH:  Paschal history of systolic congestive heart failure (Echo 03/2012 EF 20%), vitamin B12 deficiency , permanent atrial fibrillation (on coumadin), hypertension, benign positional vertigo.    PT Comments    Pt requiring return to bed for catheterization. RN students to perform however unfamiliar with posterior hip precautions. PT provided guidance about movement restrictions and demonstrated how to set up to transfer to strong side to reduce risk of L hip crossing mid line. Pt requires total A for transfers and bed mobility. D/c plan remains appropriate.      Follow Up Recommendations  SNF     Equipment Recommendations  Rolling walker with 5" wheels    Recommendations for Other Services OT consult     Precautions / Restrictions Precautions Precautions: Fall;Posterior Hip Precaution Booklet Issued: Yes (comment) Precaution Comments: s/p L hip hemiarthroplasty, pt with poor recall of precautions at end of session  3 major falls since 10/21, R wrist fx Restrictions Weight Bearing Restrictions: Yes LLE Weight Bearing: Weight bearing as tolerated       Balance Overall balance assessment: Needs assistance Sitting-balance support: Feet supported;No upper extremity supported;Bilateral upper extremity supported;Single extremity supported Sitting balance-Leahy Scale: Poor Sitting balance - Comments: requires posterior support to maintain initial sitting balance, able to progress to min guard and then requires assist as pt fatigues                                     Cognition Arousal/Alertness: Awake/alert Behavior During Therapy: WFL for tasks assessed/performed Overall Cognitive Status: Impaired/Different from baseline Area of Impairment: Memory;Following commands;Safety/judgement;Awareness;Problem solving;Attention                   Current Attention Level: Divided Memory: Decreased short-term memory;Decreased recall of precautions Following Commands: Follows one step commands with increased time;Follows multi-step commands with increased time Safety/Judgement: Decreased awareness of safety;Decreased awareness of deficits Awareness: Emergent Problem Solving: Slow processing;Decreased initiation;Requires verbal cues;Requires tactile cues General Comments: pt requires increased multimodal cuing for mobility         General Comments General comments (skin integrity, edema, etc.): Son in room, RN students in room, provided education on posterior hip precautions and proper transfers      Pertinent Vitals/Pain Pain Assessment: 0-10 Pain Score: 7  Pain Location: L hip and leg Pain Descriptors / Indicators: Grimacing;Guarding;Operative site guarding;Moaning;Throbbing;Sore;Sharp Pain Intervention(s): Limited activity within patient's tolerance;Monitored during session;Repositioned     PT Goals (current goals can now be found in the care plan section) Acute Rehab PT Goals Patient Stated Goal: go home PT Goal Formulation: With patient/family Time For Goal Achievement: 09/20/20 Potential to Achieve Goals: Fair Progress towards PT goals: Progressing toward goals    Frequency    Min 3X/week      PT Plan Current plan remains appropriate       AM-PAC PT "6 Clicks" Mobility   Outcome Measure  Help needed turning from your back to your side while in a flat bed without using bedrails?: A Lot Help needed moving from lying on your back to sitting on the  side of a flat bed without using bedrails?: A Lot Help needed moving to and  from a bed to a chair (including a wheelchair)?: Total Help needed standing up from a chair using your arms (e.g., wheelchair or bedside chair)?: Total Help needed to walk in hospital room?: Total Help needed climbing 3-5 steps with a railing? : Total 6 Click Score: 8    End of Session Equipment Utilized During Treatment: Gait belt Activity Tolerance: Patient limited by fatigue;Patient limited by pain Patient left: in bed;with bed alarm set;with nursing/sitter in room Nurse Communication: Mobility status;Other (comment) (posterior hip precautions) PT Visit Diagnosis: Unsteadiness on feet (R26.81);Other abnormalities of gait and mobility (R26.89);Muscle weakness (generalized) (M62.81);Repeated falls (R29.6);History of falling (Z91.81);Difficulty in walking, not elsewhere classified (R26.2);Adult, failure to thrive (R62.7);Pain Pain - Right/Left: Left Pain - part of body: Hip;Leg     Time: 3474-2595 PT Time Calculation (min) (ACUTE ONLY): 20 min  Charges:  $Therapeutic Activity: 8-22 mins                     Zi Newbury B. Beverely Risen PT, DPT Acute Rehabilitation Services Pager [phone] Office [phone]    Elon Alas Fleet 09/06/2020, 4:23 PM

## 2020-09-06 NOTE — Telephone Encounter (Addendum)
Spoke with patient son Jonny Ruiz,  patient is currently in the hospital due to serious fall on Sunday, and had a partial hip replacement, when patient had a CT scan a mass was found on one of her kidneys, son didn't know which side right or left.    John said that Dr at the hospital mention that it may fall into cancer category.  The  Cervical spine at c4,c5, is compressed, c6 is herniated.     Son wants to know next steps, when mom is discharged from hospital what is next.    I advised patient to discuss next steps to on call MD at the hospital on floor where patient is located on also speak to the case manager to discuss next steps, PT, home health etc.

## 2020-09-06 NOTE — Anesthesia Postprocedure Evaluation (Signed)
Anesthesia Post Note  Patient: [name]  Procedure(s) Performed: ARTHROPLASTY BIPOLAR HIP (HEMIARTHROPLASTY) (Left: Hip)     Patient location during evaluation: PACU Anesthesia Type: General Level of consciousness: awake and alert Pain management: pain level controlled Vital Signs Assessment: post-procedure vital signs reviewed and stable Respiratory status: spontaneous breathing, nonlabored ventilation, respiratory function stable and patient connected to nasal cannula oxygen Cardiovascular status: blood pressure returned to baseline and stable Postop Assessment: no apparent nausea or vomiting Anesthetic complications: no   No notable events documented.  Last Vitals:  Vitals:   09/06/20 0812 09/06/20 0845  BP: (!) 119/91 121/87  Pulse: 98 96  Resp: (!) 21   Temp: 36.9 C   SpO2: 99%     Last Pain:  Vitals:   09/06/20 0812  TempSrc: Oral  PainSc: 0-No pain                 Kennieth Rad

## 2020-09-06 NOTE — Evaluation (Signed)
Physical Therapy Evaluation Patient Details Name: [name] MRN: [id number] DOB: November 27, 1926 Today's Date: 09/06/2020   History of Present Illness  87 year old female presents to [facility] emergency department via EMS after being found status post fall by her son. Admitted 8/28 and underwent 8/29 L hip hemiarthroplasty, CT head chronic ischemic microangiopathy. Cervical spine MRI no cord compression PMH:  Paschal history of systolic congestive heart failure (Echo 03/2012 EF 20%), vitamin B12 deficiency , permanent atrial fibrillation (on coumadin), hypertension, benign positional vertigo.  Clinical Impression  Pt son assisted mother in providing home health set up and PLOF. Both educated in posterior hip precautions. Pt son reports he has retrofitted her home to be all on one level with steps to enter. Pt lives alone with son checking on her daily. Son reports, pt was working with PT/OT for progressing generalized weakness in her LE. Pt was ambulating with RW and was independent with dressing and bird baths. Son provides for iADLs. Pt is currently limited in safe mobility, by dizziness with positional change, decreased safety awareness, decreased recall of precautions, in presence of B LE weakness, and decreased endurance. Pt requires max A for bed mobility and totalA for squat pivot transfer to recliner. PT recommending SNF level rehab prior to return home. PT will continue to follow acutely.     Follow Up Recommendations SNF    Equipment Recommendations  Rolling walker with 5" wheels    Recommendations for Other Services OT consult     Precautions / Restrictions Precautions Precautions: Fall;Posterior Hip Precaution Booklet Issued: Yes (comment) Precaution Comments: s/p L hip hemiarthroplasty, pt with poor recall of precautions at end of session  3 major falls since 10/21, R wrist fx Restrictions Weight Bearing Restrictions: Yes LLE Weight Bearing: Weight bearing as  tolerated      Mobility  Bed Mobility Overal bed mobility: Needs Assistance Bed Mobility: Supine to Sit     Supine to sit: Max assist;HOB elevated     General bed mobility comments: pt able to move    Transfers Overall transfer level: Needs assistance Equipment used: 1 person hand held assist Transfers: Squat Pivot Transfers     Squat pivot transfers: Total assist     General transfer comment: attempts power up to standing with total A not able to come to fully standing, squat pivot to recliner  Ambulation/Gait             General Gait Details: unable         Balance Overall balance assessment: Needs assistance Sitting-balance support: Feet supported;No upper extremity supported;Bilateral upper extremity supported;Single extremity supported Sitting balance-Leahy Scale: Poor Sitting balance - Comments: requires posterior support to maintain initial sitting balance, able to progress to min guard and then requires assist as pt fatigues                                     Pertinent Vitals/Pain Pain Assessment: 0-10 Pain Score: 7  Pain Location: L hip and leg Pain Descriptors / Indicators: Grimacing;Guarding;Operative site guarding;Moaning;Throbbing;Sore;Sharp Pain Intervention(s): Limited activity within patient's tolerance;Monitored during session;Repositioned    Home Living Family/patient expects to be discharged to:: Private residence Living Arrangements: Alone Available Help at Discharge: Family;Available PRN/intermittently Type of Home: House Home Access: Stairs to enter     Home Layout: Multi-level;Able to live on main level with bedroom/bathroom Home Equipment: Walker - 2 wheels;Grab bars - toilet;Grab bars - tub/shower;Hand  held shower head      Prior Function Level of Independence: Needs assistance   Gait / Transfers Assistance Needed: son reports she was able to walk 1/4 mile, but maybe less lately,  ADL's / Homemaking  Assistance Needed: OT was assisting with weekly shower, and pt bird bathes rest of week, son provides for iADLs.           Extremity/Trunk Assessment   Upper Extremity Assessment Upper Extremity Assessment: Generalized weakness    Lower Extremity Assessment Lower Extremity Assessment: LLE deficits/detail;RLE deficits/detail RLE Deficits / Details: general weakness LLE Deficits / Details: s/p L hip hemiarthroplasty, LLE: Unable to fully assess due to immobilization;Unable to fully assess due to pain    Cervical / Trunk Assessment Cervical / Trunk Assessment: Kyphotic  Communication   Communication: Other (comment) (pt reports she could speak better if she had her dentures)  Cognition Arousal/Alertness: Awake/alert Behavior During Therapy: WFL for tasks assessed/performed Overall Cognitive Status: Impaired/Different from baseline Area of Impairment: Memory;Following commands;Safety/judgement;Awareness;Problem solving;Attention                   Current Attention Level: Divided Memory: Decreased short-term memory;Decreased recall of precautions Following Commands: Follows one step commands with increased time;Follows multi-step commands with increased time Safety/Judgement: Decreased awareness of safety;Decreased awareness of deficits Awareness: Emergent Problem Solving: Slow processing;Decreased initiation;Requires verbal cues;Requires tactile cues General Comments: pt requires increased multimodal cuing for mobility      General Comments General comments (skin integrity, edema, etc.): Pt son in room during session, provides PLOF and home set up, generally talks over pt to clarify, pt with c/o of dizziness with positional change, BP stable and possibly due to underlying BPPV, Pt on 2L O2 via  on entry with SaO2 98%O2, pt states she does not use O2 at home, removed and SaO2 dropped to 85%O2 with good pleth wave form, replaced and dropped with mobility but poor pleth wave  form, rebounded with sitting in recliner to mid 90%O2        Assessment/Plan    PT Assessment Patient needs continued PT services  PT Problem List Decreased strength;Decreased range of motion;Decreased activity tolerance;Decreased balance;Decreased mobility;Decreased coordination;Decreased cognition;Decreased safety awareness;Decreased knowledge of precautions;Pain       PT Treatment Interventions DME instruction;Gait training;Stair training;Functional mobility training;Therapeutic activities;Therapeutic exercise;Balance training;Cognitive remediation;Patient/family education    PT Goals (Current goals can be found in the Care Plan section)  Acute Rehab PT Goals Patient Stated Goal: go home PT Goal Formulation: With patient/family Time For Goal Achievement: 09/20/20 Potential to Achieve Goals: Fair    Frequency Min 3X/week    AM-PAC PT "6 Clicks" Mobility  Outcome Measure Help needed turning from your back to your side while in a flat bed without using bedrails?: A Lot Help needed moving from lying on your back to sitting on the side of a flat bed without using bedrails?: A Lot Help needed moving to and from a bed to a chair (including a wheelchair)?: Total Help needed standing up from a chair using your arms (e.g., wheelchair or bedside chair)?: Total Help needed to walk in hospital room?: Total   6 Click Score: 7    End of Session Equipment Utilized During Treatment: Gait belt Activity Tolerance: Patient limited by fatigue;Patient limited by pain Patient left: in chair;with call bell/phone within reach;with chair alarm set;with family/visitor present Nurse Communication: Mobility status;Other (comment) (posterior hip precautions) PT Visit Diagnosis: Unsteadiness on feet (R26.81);Other abnormalities of gait and mobility (R26.89);Muscle weakness (generalized) (  M62.81);Repeated falls (R29.6);History of falling (Z91.81);Difficulty in walking, not elsewhere classified  (R26.2);Adult, failure to thrive (R62.7);Pain Pain - Right/Left: Left Pain - part of body: Hip;Leg    Time: 0093-8182 PT Time Calculation (min) (ACUTE ONLY): 47 min   Charges:   PT Evaluation $PT Eval Moderate Complexity: 1 Mod PT Treatments $Therapeutic Activity: 23-37 mins        [name] Mcdiarmid B. [name] PT, DPT Acute Rehabilitation Services Pager [phone] Office [phone]   [name] 09/06/2020, 2:01 PM

## 2020-09-07 DIAGNOSIS — S72002D Fracture of unspecified part of neck of left femur, subsequent encounter for closed fracture with routine healing: Secondary | ICD-10-CM

## 2020-09-07 LAB — CBC
HCT: 37.2 % (ref 36.0–46.0)
Hemoglobin: 12.5 g/dL (ref 12.0–15.0)
MCH: 33.6 pg (ref 26.0–34.0)
MCHC: 33.6 g/dL (ref 30.0–36.0)
MCV: 100 fL (ref 80.0–100.0)
Platelets: 84 10*3/uL — ABNORMAL LOW (ref 150–400)
RBC: 3.72 MIL/uL — ABNORMAL LOW (ref 3.87–5.11)
RDW: 13.5 % (ref 11.5–15.5)
WBC: 10.9 10*3/uL — ABNORMAL HIGH (ref 4.0–10.5)
nRBC: 0 % (ref 0.0–0.2)

## 2020-09-07 LAB — PROTIME-INR
INR: 1.2 (ref 0.8–1.2)
Prothrombin Time: 15.3 seconds — ABNORMAL HIGH (ref 11.4–15.2)

## 2020-09-07 MED ORDER — WARFARIN - PHARMACIST DOSING INPATIENT: Freq: Every day | Status: DC

## 2020-09-07 MED ORDER — WARFARIN SODIUM 5 MG PO TABS
5.0000 mg | ORAL_TABLET | Freq: Once | ORAL | Status: AC
  Administered 2020-09-07: 5 mg via ORAL
  Filled 2020-09-07: qty 1

## 2020-09-07 MED ORDER — HYDROCODONE-ACETAMINOPHEN 5-325 MG PO TABS
1.0000 | ORAL_TABLET | Freq: Four times a day (QID) | ORAL | Status: DC | PRN
  Administered 2020-09-08 (×2): 1 via ORAL
  Filled 2020-09-07 (×2): qty 1

## 2020-09-07 NOTE — Telephone Encounter (Signed)
Called and spoke with Jonny Ruiz patient son, informed him of message.  He said that the MD at the hospital told him that his mom will be moving into a rehab facility.   John wants to know is there any facilities that you recommend?  Please advise

## 2020-09-07 NOTE — Progress Notes (Addendum)
ANTICOAGULATION CONSULT NOTE - Initial Consult  Pharmacy Consult for warfarin Indication: atrial fibrillation  Allergies  Allergen Reactions   Bactrim [Sulfamethoxazole-Trimethoprim] Nausea Only and Other (See Comments)    Headaches, also (per other chart in Epic)   Codeine Nausea Only    Per other chart in Epic   Contrast Media [Iodinated Diagnostic Agents] Other (See Comments)    Per other chart in Epic-  reaction not cited   Gabapentin Nausea And Vomiting and Other (See Comments)    Per the other chart in Epic: "The patient did not fell like herself, also."   Lactose Intolerance (Gi) Diarrhea and Nausea And Vomiting   Macrobid [Nitrofurantoin] Other (See Comments)    "Allergy" noted in other chart in Epic-  reaction not cited   Other Other (See Comments)    Poor air quality and seasonal allergies- Congestion, itchy eyes, runny nose   Penicillins Nausea And Vomiting and Other (See Comments)    Reaction in other chart in Epic (N/V)   Tape Other (See Comments)    SKIN IS VERY THIN AND WILL TEAR AND BRUISE EASILY!!!    Patient Measurements: Height: 5\' 6"  (167.6 cm) Weight: 71.7 kg (158 lb) IBW/kg (Calculated) : 59.3   Vital Signs: Temp: 97.4 F (36.3 C) (08/31 1100) Temp Source: Axillary (08/31 1100) BP: 92/66 (08/31 1100) Pulse Rate: 75 (08/31 1100)  Labs: Recent Labs    09/04/20 1820 09/05/20 0426 09/05/20 0434 09/05/20 0639 09/06/20 0129 09/07/20 0145  HGB 14.6   < > 13.6 13.9 12.3 12.5  HCT 46.0   < > 41.8 41.0 37.4 37.2  PLT 101*  --  89*  --  81* 84*  LABPROT 16.6*  --   --   --   --   --   INR 1.3*  --   --   --   --   --   CREATININE 1.02*  --  0.86  --  0.91  --   CKTOTAL 155  --   --   --   --   --    < > = values in this interval not displayed.    Estimated Creatinine Clearance: 40.9 mL/min (by C-G formula based on SCr of 0.91 mg/dL).   Medical History: Past Medical History:  Diagnosis Date   Essential hypertension 09/04/2020   Peripheral  polyneuropathy 09/04/2020   Permanent atrial fibrillation (HCC) 09/04/2020   Vitamin B12 deficiency 09/04/2020    Medications:  Medications Prior to Admission  Medication Sig Dispense Refill Last Dose   Cholecalciferol (VITAMIN D-3 PO) Take 1 capsule by mouth daily.   09/04/2020   Cyanocobalamin (VITAMIN B-12 PO) Take 1 tablet by mouth daily.   09/04/2020   Ferrous Gluconate 239 (27 Fe) MG TABS Take 27 mg by mouth daily with breakfast.   09/04/2020 at am   furosemide (LASIX) 40 MG tablet Take 40 mg by mouth in the morning and at bedtime.   09/04/2020 at am   loratadine (CLARITIN) 10 MG tablet Take 10 mg by mouth daily.   09/04/2020   metoprolol tartrate (LOPRESSOR) 100 MG tablet Take 100 mg by mouth 2 (two) times daily.   09/04/2020 at 0600   Multiple Vitamins-Minerals (PRESERVISION/LUTEIN) CAPS Take 1 capsule by mouth daily with breakfast.   09/04/2020 at am   nystatin cream (MYCOSTATIN) Apply 1 application topically 2 (two) times daily as needed (irritation on the chest).   Past Week   potassium chloride (KLOR-CON) 10 MEQ tablet Take 10 mEq  by mouth 3 (three) times daily.   09/04/2020 at am   sodium fluoride (FLUORISHIELD) 1.1 % GEL dental gel Place 1 application onto teeth at bedtime.   09/03/2020 at pm   SYSTANE ULTRA PF 0.4-0.3 % SOLN Place 1 drop into both eyes in the morning, at noon, and at bedtime.   09/04/2020   warfarin (COUMADIN) 5 MG tablet Take 2.5-5 mg by mouth See admin instructions. Take 5 mg by mouth in the morning on Sun/Tues/Wed/Fri/Sat and 2.5 mg on Mon/Thurs   ON HOLD   Scheduled:   aspirin  81 mg Oral BID   docusate sodium  100 mg Oral BID   feeding supplement  237 mL Oral BID BM   loratadine  10 mg Oral Daily   metoprolol tartrate  100 mg Oral BID   multivitamin with minerals  1 tablet Oral Daily   mupirocin ointment  1 application Nasal BID    Assessment: 87 yo female with femoral neck fracture s/p OR 8/29. She was on warfarin PTA for afib and pharmacy to resume  today -INR= 1.3 on 8/28 -hg= 12.5, plt= 85 (admit was 101 on 8/28)  Home warfarin dose: 5mg /day except take 2.5mg  MoTh  Goal of Therapy:  INR 2-3 Monitor platelets by anticoagulation protocol: Yes   Plan:  -Warfarin 5mg  po today -Daily PT/INR -Since warfarin is being added, discontinue aspirin (on for VTE prophylaxis after OR; discussed with Dr. )  , PharmD Clinical Pharmacist **Pharmacist phone directory can now be found on [email or web address] ([email or web address]).  Listed under [facility].

## 2020-09-07 NOTE — [place] FL2 (Signed)
[place] LEVEL OF CARE SCREENING TOOL     IDENTIFICATION  Patient Name: [name] Birthdate: 03-20-1926 Sex: female Admission Date (Current Location): 09/04/2020  [facility] and [place] Number:  Producer, television/film/video and Address:  [facility], [address], [place], [place] [address]      Provider Number: [id number]  Attending Physician Name and Address:  Zannie Cove, MD  Relative Name and Phone Number:  Jonny Ruiz [phone]    Current Level of Care: Hospital Recommended Level of Care: Skilled Nursing Facility Prior Approval Number:    Date Approved/Denied:   PASRR Number: [id number]  Discharge Plan: SNF    Current Diagnoses: Patient Active Problem List   Diagnosis Date Noted   S/P left hip hemiarthroplasty 09/05/2020   Fracture of femoral neck, left, closed (HCC) 09/04/2020   Permanent atrial fibrillation (HCC) 09/04/2020   Peripheral polyneuropathy 09/04/2020   Herniation of intervertebral disc at C4-C5 level 09/04/2020   Essential hypertension 09/04/2020   Vitamin B12 deficiency 09/04/2020   Right renal mass 09/04/2020   Acute urinary retention 09/04/2020   Chronic systolic CHF (congestive heart failure) (HCC) 09/04/2020   Acute respiratory failure with hypoxia (HCC) 09/04/2020    Orientation RESPIRATION BLADDER Height & Weight     Self, Time, Situation, Place  O2 (Nasal Cannula 3 liters) External catheter, Continent Weight: 158 lb (71.7 kg) Height:  5\' 6"  (167.6 cm)  BEHAVIORAL SYMPTOMS/MOOD NEUROLOGICAL BOWEL NUTRITION STATUS      Continent Diet (Please see discharge summary)  AMBULATORY STATUS COMMUNICATION OF NEEDS Skin   Extensive Assist Verbally Other (Comment) (Incision closed,hip,Left)                       Personal Care Assistance Level of Assistance  Bathing, Feeding, Dressing Bathing Assistance: Limited assistance Feeding assistance: Independent Dressing Assistance: Limited assistance      Functional Limitations Info  Sight, Hearing, Speech Sight Info: Adequate Hearing Info: Adequate Speech Info: Adequate    SPECIAL CARE FACTORS FREQUENCY  PT (By licensed PT), OT (By licensed OT)     PT Frequency: 5x min weekly OT Frequency: 5x min weekly            Contractures Contractures Info: Not present    Additional Factors Info  Code Status, Allergies Code Status Info: DNR Allergies Info: Bactrim (sulfamethoxazole-trimethoprim),Codeine,Contrast Media (iodinated Diagnostic Agents),Gabapentin,Lactose Intolerance (gi),Macrobid (nitrofurantoin),other/Poor air quality and seasonal allergies- Congestion, itchy eyes, runny nose,Penicillins,Tape           Current Medications (09/07/2020):  This is the current hospital active medication list Current Facility-Administered Medications  Medication Dose Route Frequency Provider Last Rate Last Admin   0.9 %  sodium chloride infusion   Intravenous Continuous 09/09/2020, PA-C 75 mL/hr at 09/07/20 1235 New Bag at 09/07/20 1235   acetaminophen (TYLENOL) tablet 325-650 mg  325-650 mg Oral Q6H PRN 09/09/20, PA-C   650 mg at 09/07/20 1009   albuterol (PROVENTIL) (2.5 MG/3ML) 0.083% nebulizer solution 2.5 mg  2.5 mg Nebulization Q4H PRN 09/09/20, PA-C       aspirin chewable tablet 81 mg  81 mg Oral BID Cassandria Anger, PA-C   81 mg at 09/07/20 1009   bisacodyl (DULCOLAX) suppository 10 mg  10 mg Rectal Daily PRN 09/09/20, PA-C       diphenhydrAMINE (BENADRYL) 12.5 MG/5ML elixir 12.5-25 mg  12.5-25 mg Oral Q4H PRN 02-08-1984, PA-C  docusate sodium (COLACE) capsule 100 mg  100 mg Oral BID Cassandria Anger, PA-C   100 mg at 09/07/20 1009   feeding supplement (ENSURE ENLIVE / ENSURE PLUS) liquid 237 mL  237 mL Oral BID BM Cipriano Bunker, MD   237 mL at 09/07/20 1010   HYDROcodone-acetaminophen (NORCO) 7.5-325 MG per tablet 1-2 tablet  1-2 tablet Oral Q4H PRN Cassandria Anger, PA-C        HYDROcodone-acetaminophen (NORCO/VICODIN) 5-325 MG per tablet 1-2 tablet  1-2 tablet Oral Q4H PRN Cassandria Anger, PA-C   2 tablet at 09/07/20 1015   HYDROmorphone (DILAUDID) injection 0.5-1 mg  0.5-1 mg Intravenous Q2H PRN Cassandria Anger, PA-C       loratadine (CLARITIN) tablet 10 mg  10 mg Oral Daily Arrien, York Ram, MD   10 mg at 09/07/20 1009   menthol-cetylpyridinium (CEPACOL) lozenge 3 mg  1 lozenge Oral PRN Cassandria Anger, PA-C       Or   phenol (CHLORASEPTIC) mouth spray 1 spray  1 spray Mouth/Throat PRN Cassandria Anger, PA-C       methocarbamol (ROBAXIN) tablet 500 mg  500 mg Oral Q6H PRN Cassandria Anger, PA-C       Or   methocarbamol (ROBAXIN) 500 mg in dextrose 5 % 50 mL IVPB  500 mg Intravenous Q6H PRN Cassandria Anger, PA-C       metoCLOPramide (REGLAN) tablet 5-10 mg  5-10 mg Oral Q8H PRN Cassandria Anger, PA-C       Or   metoCLOPramide (REGLAN) injection 5-10 mg  5-10 mg Intravenous Q8H PRN Cassandria Anger, PA-C       metoprolol tartrate (LOPRESSOR) tablet 100 mg  100 mg Oral BID Coralie Keens, MD   100 mg at 09/07/20 1009   multivitamin with minerals tablet 1 tablet  1 tablet Oral Daily Cipriano Bunker, MD   1 tablet at 09/07/20 1009   mupirocin ointment (BACTROBAN) 2 % 1 application  1 application Nasal BID Marinda Elk, MD   1 application at 09/07/20 1010   ondansetron (ZOFRAN) tablet 4 mg  4 mg Oral Q6H PRN Rosalene Billings R, PA-C   4 mg at 09/06/20 7628   Or   ondansetron (ZOFRAN) injection 4 mg  4 mg Intravenous Q6H PRN Rosalene Billings R, PA-C       polyethylene glycol (MIRALAX / GLYCOLAX) packet 17 g  17 g Oral Daily PRN Cassandria Anger, PA-C         Discharge Medications: Please see discharge summary for a list of discharge medications.  Relevant Imaging Results:  Relevant Lab Results:   Additional Information [id number]  Carmina Miller, LCSWA

## 2020-09-07 NOTE — Progress Notes (Signed)
PROGRESS NOTE    [name]  ZOX:[id number] DOB: [birth year] DOA: 09/04/2020 PCP: [name], MD   Brief Narrative:  87 year old female with PMH significant for systolic heart failure, atrial fibrillation on Coumadin, hypertension, B12 deficiency, benign positional vertigo presented s/p mechanical fall.  Apparently patient had an unwitnessed fall.  She was found by her son with severe pain in her left lower extremity and left hip.   x-ray shows left femoral neck fractureCT head chronic ischemic microangiopathy,  no fracture or subluxation of cervical spine.  Cervical spine noted disc herniation, MRI no cord compression. Patient is admitted s/p fall with femoral neck fracture.  S/p ORIF.  Assessment & Plan:   Left femoral neck fracture: Patient presented s/p fall, x-ray shows left femoral neck fracture. Ortho consulted, patient underwent left hip hemiarthroplasty tolerated well. Continue adequate pain control.  Weightbearing as tolerated. PT and OT recommended skilled nursing. -Coumadin restarted, she was on this prior to admission Outpatient follow-up with Dr. Charlann Boxer in 2 weeks.  Acute urinary retention: -Developed urinary retention postop requiring Foley catheter, this was then removed -Required I's/O cath yesterday, if Recurs Place, Foley catheter  Permanent atrial fibrillation: Heart rate controlled with metoprolol, Patient on warfarin at home. Resume Coumadin   Chronic systolic heart failure: No signs of clinical volume overload. Recent echocardiogram LV EF 35%.  Severe hypokinesis of septal wall and inferior lateral wall. -Discontinue IV fluids, clinically appears euvolemic  Cervical disc disease MRI C-spine noted disc herniation C4/C5, no cord compression -No symptoms, monitor  Right renal mass -Incidentally noted on imaging, follow-up with urology, doubt she is a surgical candidate given advanced age and frailty  DVT prophylaxis: Lovenox Code Status:  DNR Family Communication: son at bedside Disposition Plan:   Status is: Inpatient  Remains inpatient appropriate because:Inpatient level of care appropriate due to severity of illness  Dispo: The patient is from: Home              Anticipated d/c is to: SNF              Patient currently is medically stable to d/c.   Difficult to place patient No   Consultants:  Orthopedics  Procedures: S/p left hip hemiarthroplasty Antimicrobials:   Anti-infectives (From admission, onward)    Start     Dose/Rate Route Frequency Ordered Stop   09/06/20 0600  ceFAZolin (ANCEF) IVPB 2g/100 mL premix        2 g 200 mL/hr over 30 Minutes Intravenous On call to O.R. 09/05/20 1511 09/06/20 0538   09/05/20 2100  ceFAZolin (ANCEF) IVPB 2g/100 mL premix        2 g 200 mL/hr over 30 Minutes Intravenous Every 6 hours 09/05/20 2011 09/06/20 0538        Subjective: -Feels okay, sitting up with physical therapy  Objective: Vitals:   09/06/20 2022 09/07/20 0700 09/07/20 1100 09/07/20 1300  BP: 110/70 102/68 92/66 (!) 92/57  Pulse: 86 82 75 72  Resp: 15 16 17 16   Temp: 98.4 F (36.9 C) 97.8 F (36.6 C) (!) 97.4 F (36.3 C) (!) 97.5 F (36.4 C)  TempSrc:  Axillary Axillary Axillary  SpO2:  98% 96% 96%  Weight:      Height:        Intake/Output Summary (Last 24 hours) at 09/07/2020 1546 Last data filed at 09/07/2020 0530 Gross per 24 hour  Intake --  Output 700 ml  Net -700 ml   09/09/2020   09/04/20  1858  Weight: 71.7 kg    Examination:  General exam: Pleasant elderly female sitting up in the recliner, AAO x2, no distress CVS: S1-S2, regular rate rhythm Lungs: Decreased breath sounds to bases Abdomen: Soft, nontender, bowel sounds present Extremities: Left hip dressing noted Skin: No rash on exposed skin   Data Reviewed: I have personally reviewed following labs and imaging studies  CBC: Recent Labs  Lab 09/04/20 1820 09/05/20 0426 09/05/20 0434 09/05/20 0639  09/06/20 0129 09/07/20 0145  WBC 9.4  --  8.6  --  8.8 10.9*  NEUTROABS 8.0*  --  6.3  --   --   --   HGB 14.6 13.6 13.6 13.9 12.3 12.5  HCT 46.0 40.0 41.8 41.0 37.4 37.2  MCV 103.4*  --  100.7*  --  100.8* 100.0  PLT 101*  --  89*  --  81* 84*   Basic Metabolic Panel: Recent Labs  Lab 09/04/20 1820 09/05/20 0426 09/05/20 0434 09/05/20 0639 09/06/20 0129  NA 139 141 139 141 139  K 4.0 3.5 3.5 3.5 3.9  CL 103  --  102  --  104  CO2 25  --  27  --  26  GLUCOSE 166*  --  125*  --  153*  BUN 24*  --  22  --  23  CREATININE 1.02*  --  0.86  --  0.91  CALCIUM 9.2  --  9.0  --  8.5*   GFR: Estimated Creatinine Clearance: 40.9 mL/min (by C-G formula based on SCr of 0.91 mg/dL). Liver Function Tests: Recent Labs  Lab 09/04/20 1820 09/05/20 0434  AST 47* 32  ALT 43 34  ALKPHOS 127* 109  BILITOT 2.2* 1.9*  PROT 7.1 6.3*  ALBUMIN 3.8 3.2*   No results for input(s): LIPASE, AMYLASE in the last 168 hours. No results for input(s): AMMONIA in the last 168 hours. Coagulation Profile: Recent Labs  Lab 09/04/20 1820  INR 1.3*   Cardiac Enzymes: Recent Labs  Lab 09/04/20 1820  CKTOTAL 155   BNP (last 3 results) No results for input(s): PROBNP in the last 8760 hours. HbA1C: No results for input(s): HGBA1C in the last 72 hours. CBG: Recent Labs  Lab 09/06/20 2121  GLUCAP 174*   Lipid Profile: No results for input(s): CHOL, HDL, LDLCALC, TRIG, CHOLHDL, LDLDIRECT in the last 72 hours. Thyroid Function Tests: No results for input(s): TSH, T4TOTAL, FREET4, T3FREE, THYROIDAB in the last 72 hours. Anemia Panel: Recent Labs    09/05/20 0434  VITAMINB12 935*  FOLATE 15.0   Sepsis Labs: No results for input(s): PROCALCITON, LATICACIDVEN in the last 168 hours.  Recent Results (from the past 240 hour(s))  Resp Panel by RT-PCR (Flu A&B, Covid) Nasopharyngeal Swab     Status: None   Collection Time: 09/04/20  6:51 PM   Specimen: Nasopharyngeal Swab; Nasopharyngeal(NP)  swabs in vial transport medium  Result Value Ref Range Status   SARS Coronavirus 2 by RT PCR NEGATIVE NEGATIVE Final    Comment: (NOTE) SARS-CoV-2 target nucleic acids are NOT DETECTED.  The SARS-CoV-2 RNA is generally detectable in upper respiratory specimens during the acute phase of infection. The lowest concentration of SARS-CoV-2 viral copies this assay can detect is 138 copies/mL. A negative result does not preclude SARS-Cov-2 infection and should not be used as the sole basis for treatment or other patient management decisions. A negative result may occur with  improper specimen collection/handling, submission of specimen other than nasopharyngeal swab, presence of  viral mutation(s) within the areas targeted by this assay, and inadequate number of viral copies(<138 copies/mL). A negative result must be combined with clinical observations, patient history, and epidemiological information. The expected result is Negative.  Fact Sheet for Patients:  BloggerCourse.com  Fact Sheet for Healthcare Providers:  SeriousBroker.it  This test is no t yet approved or cleared by the [place] FDA and  has been authorized for detection and/or diagnosis of SARS-CoV-2 by FDA under an Emergency Use Authorization (EUA). This EUA will remain  in effect (meaning this test can be used) for the duration of the COVID-19 declaration under Section 564(b)(1) of the Act, 21 U.S.C.section 360bbb-3(b)(1), unless the authorization is terminated  or revoked sooner.       Influenza A by PCR NEGATIVE NEGATIVE Final   Influenza B by PCR NEGATIVE NEGATIVE Final    Comment: (NOTE) The Xpert Xpress SARS-CoV-2/FLU/RSV plus assay is intended as an aid in the diagnosis of influenza from Nasopharyngeal swab specimens and should not be used as a sole basis for treatment. Nasal washings and aspirates are unacceptable for Xpert Xpress  SARS-CoV-2/FLU/RSV testing.  Fact Sheet for Patients: BloggerCourse.com  Fact Sheet for Healthcare Providers: SeriousBroker.it  This test is not yet approved or cleared by the [place] FDA and has been authorized for detection and/or diagnosis of SARS-CoV-2 by FDA under an Emergency Use Authorization (EUA). This EUA will remain in effect (meaning this test can be used) for the duration of the COVID-19 declaration under Section 564(b)(1) of the Act, 21 U.S.C. section 360bbb-3(b)(1), unless the authorization is terminated or revoked.  Performed at [facility], [address], [place], [place] [address]   Surgical pcr screen     Status: Abnormal   Collection Time: 09/05/20  3:09 PM   Specimen: Nasal Mucosa; Nasal Swab  Result Value Ref Range Status   MRSA, PCR NEGATIVE NEGATIVE Final   Staphylococcus aureus POSITIVE (A) NEGATIVE Final    Comment: (NOTE) The Xpert SA Assay (FDA approved for NASAL specimens in patients 78 years of age and older), is one component of a comprehensive surveillance program. It is not intended to diagnose infection nor to guide or monitor treatment. Performed at [facility], [address], [place], [place] [address]          Radiology Studies: DG Pelvis Portable  Result Date: 09/05/2020 CLINICAL DATA:  Status post hip arthroplasty EXAM: PORTABLE PELVIS 1-2 VIEWS COMPARISON:  09/04/2020 FINDINGS: Interval left hip replacement with intact hardware and normal alignment. Gas in the soft tissues consistent with recent surgery IMPRESSION: Left hip arthroplasty with expected postsurgical change Electronically Signed   By: Jasmine Pang M.D.   On: 09/05/2020 20:33     Scheduled Meds:  docusate sodium  100 mg Oral BID   feeding supplement  237 mL Oral BID BM   loratadine  10 mg Oral Daily   metoprolol tartrate  100 mg Oral BID   multivitamin with minerals  1 tablet Oral Daily    mupirocin ointment  1 application Nasal BID   warfarin  5 mg Oral ONCE-1600   Warfarin - Pharmacist Dosing Inpatient   Does not apply q1600   Continuous Infusions:  sodium chloride 75 mL/hr at 09/07/20 1235   methocarbamol (ROBAXIN) IV       LOS: 3 days    Time spent: 25 mins    Zannie Cove, MD [facility]   If 7PM-7AM, please contact night-coverage

## 2020-09-07 NOTE — TOC Progression Note (Addendum)
Transition of Care [facility]) - Progression Note    Patient Details  Name: [name] MRN: [id number] Date of Birth: [birth year]  [facility] CM/SW Contact  Terrial Rhodes, LCSWA Phone Number: 09/07/2020, 3:55 PM  Clinical Narrative:     Update- CSW started insurance authorization for patient reference number is # [id number]. Need to add facility choice to patients insurance once SNF bed is confirmed. CSW will follow up with [name] with [facility] in the morning to see if she can offer patient a SNF bed.  CSW spoke with patients son [name] and he chose SNF placement at [facility]. [name] with [facility] ask CSW to follow up with her in the morning on SNF bed availability. CSW will continue to follow and assist with dc planning needs.  Expected Discharge Plan: Skilled Nursing Facility Barriers to Discharge: Continued Medical Work up  Expected Discharge Plan and Services Expected Discharge Plan: Skilled Nursing Facility In-house Referral: Clinical Social Work     Living arrangements for the past 2 months: Single Family Home                                       Social Determinants of Health (SDOH) Interventions    Readmission Risk Interventions No flowsheet data found.

## 2020-09-07 NOTE — Therapy (Signed)
Occupational Therapy Evaluation Patient Details Name: [name] MRN: [id number] DOB: 1926-08-08 Today's Date: 09/07/2020    History of Present Illness 87 year old female presents to [facility] emergency department via EMS after being found status post fall by her son. Admitted 8/28 and underwent 8/29 L hip hemiarthroplasty, CT head chronic ischemic microangiopathy. Cervical spine MRI no cord compression PMH:  Paschal history of systolic congestive heart failure (Echo 03/2012 EF 20%), vitamin B12 deficiency , permanent atrial fibrillation (on coumadin), hypertension, benign positional vertigo.   Clinical Impression   Pt admitted above with deficits as listed below. She is Mod A +2 for transfers and functional mobility related to ADL's and total assist for LB ADL's. She currently demonstrates decreased ability to perform ADL's at previous Mod I level and should benefit from acute OT to assist in maximizing independence w/ ADL tasks prior to rehab at SNF secondary to lives alone and son checks on her daily. May benefit from use of Stedy.    Follow Up Recommendations  SNF;Supervision/Assistance - 24 hour    Equipment Recommendations  Other (comment) (Defer to next venue)    Recommendations for Other Services       Precautions / Restrictions Precautions Precautions: Fall;Posterior Hip Precaution Booklet Issued: Yes (comment) Precaution Comments: s/p L hip hemiarthroplasty, pt with poor recall of precautions at end of session  3 major falls since 10/21, R wrist fx Restrictions Weight Bearing Restrictions: Yes LLE Weight Bearing: Weight bearing as tolerated      Mobility Bed Mobility Overal bed mobility: Needs Assistance Bed Mobility: Sit to Supine     Supine to sit: Max assist;HOB elevated Sit to supine: Max assist;+2 for physical assistance;HOB elevated   General bed mobility comments: Pt was overall Mod A sitting up at EOB and vc's to use UE's for support to  maintain trunk in midline as she is noted to lean to left laterally 2* THA L    Transfers Overall transfer level: Needs assistance Equipment used: 1 person hand held assist (Attempted RW use, pt currently unable. May benefit from use of [place]) Transfers: Pharmacologist;Sit to/from Stand     Squat pivot transfers: Max assist;Total assist;From elevated surface     General transfer comment: power up to standing with max A not able to come to fully standing on her own, required multimodal cues to stand upright, SPT to recliner chair.    Balance Overall balance assessment: Needs assistance Sitting-balance support: Feet supported;Single extremity supported;Bilateral upper extremity supported Sitting balance-Leahy Scale: Fair Sitting balance - Comments: Pt initially required right lateral support then min guard with verbal and tactile cues to maintain upright posture in sitting at EOB Postural control: Right lateral lean (taking weight off of L hip in static sitting) Standing balance support: Bilateral upper extremity supported (HHA) Standing balance-Leahy Scale: Poor Standing balance comment: Pt may benefit from use of Stedy as pt is reluctant to WB through LE's and h/o falls. Pt able to stand upright briefly with Max A given multiple multimodal cues & gait belt use today.     ADL either performed or assessed with clinical judgement   ADL Overall ADL's : Needs assistance/impaired Eating/Feeding: Set up;Sitting;Bed level   Grooming: Set up;Bed level;Cueing for sequencing   Upper Body Bathing: Minimal assistance;Set up;Bed level   Lower Body Bathing: Total assistance;Bed level;+2 for physical assistance;+2 for safety/equipment   Upper Body Dressing : Set up;Minimal assistance;Bed level   Lower Body Dressing: Total assistance;+2 for physical assistance;+2 for safety/equipment;Bed level;Sit  to/from Database administrator: Moderate assistance;+2 for physical assistance;+2 for  safety/equipment;Cueing for safety;Cueing for sequencing;Stand-pivot;Ambulation;BSC;RW (Simulated transfer from EOB to recliner chair at bedside)   Toileting- Clothing Manipulation and Hygiene: Total assistance;+2 for physical assistance;+2 for safety/equipment;Bed level;Sit to/from stand;Adhering to hip precautions       Functional mobility during ADLs: Moderate assistance;+2 for physical assistance;+2 for safety/equipment;Cueing for sequencing;Cueing for safety (Attempted w/ RW, however pt unable, therefore w/ gait belt and HHA +2. Transfers to strong side secondary to posterior hip precautions) General ADL Comments: Pt son assist with all IADL's PTA, Pt takes bird baths at home and was at Mod I level sitting at sink per son report. Pt currently Mod +2 overall for safety/sequencing. Family/pt is agreeable to SNF Rehab as pt lives alone and son checks on her daily.    Pertinent Vitals/Pain Pain Assessment: 0-10 Pain Score: 8  Pain Location: L hip and leg Pain Descriptors / Indicators: Guarding;Aching;Sore Pain Intervention(s): Limited activity within patient's tolerance;Monitored during session;Premedicated before session;Repositioned     Hand Dominance Right (However pt w/ previous right wrist fracture that has impacted her strength per son report and MMT)   Extremity/Trunk Assessment Upper Extremity Assessment Upper Extremity Assessment: Generalized weakness;RUE deficits/detail RUE Deficits / Details: Overall decreased strength following R wrist fracture. Overall ~-3-3/5 for grip and biceps/triceps. Moves slowly. Able to use RUE to steady on bed w/o c/o in sitting up EOB.   Lower Extremity Assessment Lower Extremity Assessment: Defer to PT evaluation   Cervical / Trunk Assessment Cervical / Trunk Assessment: Kyphotic   Communication     Cognition Arousal/Alertness: Awake/alert Behavior During Therapy: WFL for tasks assessed/performed Overall Cognitive Status: Impaired/Different  from baseline Area of Impairment: Memory;Following commands;Safety/judgement;Awareness;Problem solving;Attention     Current Attention Level: Divided Memory: Decreased short-term memory;Decreased recall of precautions Following Commands: Follows one step commands with increased time;Follows multi-step commands with increased time Safety/Judgement: Decreased awareness of safety;Decreased awareness of deficits Awareness: Emergent Problem Solving: Slow processing;Decreased initiation;Requires verbal cues;Requires tactile cues General Comments: pt requires increased multimodal cuing for mobility   General Comments  Pt son in room during session, provides PLOF and home set up, generally talks over pt to clarify, pt with c/o of initial dizziness with positional change, BP stable and possibly due to underlying BPPV, Pt on 2L O2 via [place] on entry with SaO2 100%O2, pt states she does not use O2 at home.          Home Living Family/patient expects to be discharged to:: Private residence Living Arrangements: Alone Available Help at Discharge: Family;Available PRN/intermittently Type of Home: House Home Access: Stairs to enter     Home Layout: Multi-level;Able to live on main level with bedroom/bathroom     Bathroom Shower/Tub: Tub/shower unit   Bathroom Toilet: Handicapped height Bathroom Accessibility: Yes   Home Equipment: Walker - 2 wheels;Grab bars - toilet;Grab bars - tub/shower;Hand held shower head   Additional Comments: Son present throuhgout assessment and assisted w/ PLOF and history as pt appears pleasantly confused at times.      Prior Functioning/Environment Level of Independence: Needs assistance  Gait / Transfers Assistance Needed: son reports she was able to walk 1/4 mile, but maybe less lately, ADL's / Homemaking Assistance Needed: OT was assisting with weekly shower, and pt bird bathes rest of week, son provides for iADLs.            OT Problem List: Decreased  activity tolerance;Impaired balance (sitting and/or standing);Decreased cognition;Decreased safety awareness;Decreased knowledge  of use of DME or AE;Decreased knowledge of precautions;Pain      OT Treatment/Interventions: Self-care/ADL training;Therapeutic exercise;DME and/or AE instruction;Therapeutic activities;Patient/family education    OT Goals(Current goals can be found in the care plan section) Acute Rehab OT Goals Patient Stated Goal: go home OT Goal Formulation: With patient Time For Goal Achievement: 09/21/20 Potential to Achieve Goals: Fair ADL Goals Pt Will Perform Grooming: with modified independence;sitting Pt Will Perform Lower Body Bathing: with min assist;sitting/lateral leans;sit to/from stand Pt Will Perform Lower Body Dressing: with min assist;with adaptive equipment;sitting/lateral leans;sit to/from stand Pt Will Transfer to Toilet: with min assist;bedside commode;ambulating Pt Will Perform Toileting - Clothing Manipulation and hygiene: with min assist;sitting/lateral leans;sit to/from stand Additional ADL Goal #1: Pt will sit up at EOB w/ supervision during grooming and bathing tasks for 5-10 min in preparation for increased participation in ADL's sitting at sink level.  OT Frequency: Min 2X/week   Barriers to D/C: Other (comment) (Lives alone with son checking on her daily. Was receiving PT/OT at home prior to this visit)  Lives alone. Pt/family agreeable to SNF Rehab following acute stay          AM-PAC OT "6 Clicks" Daily Activity     Outcome Measure Help from another person eating meals?: None Help from another person taking care of personal grooming?: A Little Help from another person toileting, which includes using toliet, bedpan, or urinal?: Total Help from another person bathing (including washing, rinsing, drying)?: A Lot Help from another person to put on and taking off regular upper body clothing?: A Little Help from another person to put on and taking  off regular lower body clothing?: Total 6 Click Score: 14   End of Session Equipment Utilized During Treatment: Gait belt;Rolling walker;Oxygen Nurse Communication: Other (comment) (Pt son requesting time that pain meds were given this morning, can they replace [place])  Activity Tolerance: Patient tolerated treatment well Patient left: in chair;with call bell/phone within reach;with chair alarm set;with nursing/sitter in room;with family/visitor present  OT Visit Diagnosis: Unsteadiness on feet (R26.81);Muscle weakness (generalized) (M62.81);History of falling (Z91.81);Other abnormalities of gait and mobility (R26.89);Pain                Time: 0821-0859 OT Time Calculation (min): 38 min Charges:  OT General Charges $OT Visit: 1 Visit OT Evaluation $OT Eval Moderate Complexity: 1 Mod OT Treatments $Therapeutic Activity: 8-22 mins  Gearl Kimbrough Beth Dixon, OTR/L 09/07/2020, 9:59 AM

## 2020-09-08 DIAGNOSIS — J9690 Respiratory failure, unspecified, unspecified whether with hypoxia or hypercapnia: Secondary | ICD-10-CM | POA: Diagnosis not present

## 2020-09-08 DIAGNOSIS — R339 Retention of urine, unspecified: Secondary | ICD-10-CM | POA: Diagnosis not present

## 2020-09-08 DIAGNOSIS — Z7901 Long term (current) use of anticoagulants: Secondary | ICD-10-CM | POA: Diagnosis not present

## 2020-09-08 DIAGNOSIS — Z741 Need for assistance with personal care: Secondary | ICD-10-CM | POA: Diagnosis not present

## 2020-09-08 DIAGNOSIS — S72002D Fracture of unspecified part of neck of left femur, subsequent encounter for closed fracture with routine healing: Secondary | ICD-10-CM | POA: Diagnosis not present

## 2020-09-08 DIAGNOSIS — Z7401 Bed confinement status: Secondary | ICD-10-CM | POA: Diagnosis not present

## 2020-09-08 DIAGNOSIS — M519 Unspecified thoracic, thoracolumbar and lumbosacral intervertebral disc disorder: Secondary | ICD-10-CM | POA: Diagnosis not present

## 2020-09-08 DIAGNOSIS — K5900 Constipation, unspecified: Secondary | ICD-10-CM | POA: Diagnosis not present

## 2020-09-08 DIAGNOSIS — D519 Vitamin B12 deficiency anemia, unspecified: Secondary | ICD-10-CM | POA: Diagnosis not present

## 2020-09-08 DIAGNOSIS — G629 Polyneuropathy, unspecified: Secondary | ICD-10-CM | POA: Diagnosis not present

## 2020-09-08 DIAGNOSIS — R6889 Other general symptoms and signs: Secondary | ICD-10-CM | POA: Diagnosis not present

## 2020-09-08 DIAGNOSIS — R5383 Other fatigue: Secondary | ICD-10-CM | POA: Diagnosis not present

## 2020-09-08 DIAGNOSIS — S72002S Fracture of unspecified part of neck of left femur, sequela: Secondary | ICD-10-CM | POA: Diagnosis not present

## 2020-09-08 DIAGNOSIS — Z743 Need for continuous supervision: Secondary | ICD-10-CM | POA: Diagnosis not present

## 2020-09-08 DIAGNOSIS — R1312 Dysphagia, oropharyngeal phase: Secondary | ICD-10-CM | POA: Diagnosis not present

## 2020-09-08 DIAGNOSIS — M50221 Other cervical disc displacement at C4-C5 level: Secondary | ICD-10-CM | POA: Diagnosis not present

## 2020-09-08 DIAGNOSIS — I4891 Unspecified atrial fibrillation: Secondary | ICD-10-CM | POA: Diagnosis not present

## 2020-09-08 DIAGNOSIS — R269 Unspecified abnormalities of gait and mobility: Secondary | ICD-10-CM | POA: Diagnosis not present

## 2020-09-08 DIAGNOSIS — J9601 Acute respiratory failure with hypoxia: Secondary | ICD-10-CM | POA: Diagnosis not present

## 2020-09-08 DIAGNOSIS — N183 Chronic kidney disease, stage 3 unspecified: Secondary | ICD-10-CM | POA: Diagnosis not present

## 2020-09-08 DIAGNOSIS — I5022 Chronic systolic (congestive) heart failure: Secondary | ICD-10-CM | POA: Diagnosis not present

## 2020-09-08 DIAGNOSIS — M6281 Muscle weakness (generalized): Secondary | ICD-10-CM | POA: Diagnosis not present

## 2020-09-08 DIAGNOSIS — S72002A Fracture of unspecified part of neck of left femur, initial encounter for closed fracture: Secondary | ICD-10-CM | POA: Diagnosis not present

## 2020-09-08 DIAGNOSIS — N2889 Other specified disorders of kidney and ureter: Secondary | ICD-10-CM | POA: Diagnosis not present

## 2020-09-08 DIAGNOSIS — U071 COVID-19: Secondary | ICD-10-CM | POA: Diagnosis not present

## 2020-09-08 DIAGNOSIS — I10 Essential (primary) hypertension: Secondary | ICD-10-CM | POA: Diagnosis not present

## 2020-09-08 DIAGNOSIS — R2681 Unsteadiness on feet: Secondary | ICD-10-CM | POA: Diagnosis not present

## 2020-09-08 DIAGNOSIS — D649 Anemia, unspecified: Secondary | ICD-10-CM | POA: Diagnosis not present

## 2020-09-08 DIAGNOSIS — R0902 Hypoxemia: Secondary | ICD-10-CM | POA: Diagnosis not present

## 2020-09-08 DIAGNOSIS — Z471 Aftercare following joint replacement surgery: Secondary | ICD-10-CM | POA: Diagnosis not present

## 2020-09-08 DIAGNOSIS — R0602 Shortness of breath: Secondary | ICD-10-CM | POA: Diagnosis not present

## 2020-09-08 DIAGNOSIS — Z96642 Presence of left artificial hip joint: Secondary | ICD-10-CM | POA: Diagnosis not present

## 2020-09-08 LAB — RESP PANEL BY RT-PCR (FLU A&B, COVID) ARPGX2
Influenza A by PCR: NEGATIVE
Influenza B by PCR: NEGATIVE
SARS Coronavirus 2 by RT PCR: NEGATIVE

## 2020-09-08 MED ORDER — POTASSIUM CHLORIDE CRYS ER 10 MEQ PO TBCR: 10.0000 meq | EXTENDED_RELEASE_TABLET | Freq: Every day | ORAL | Status: AC

## 2020-09-08 MED ORDER — WARFARIN SODIUM 5 MG PO TABS: 2.5000 mg | ORAL_TABLET | ORAL | Status: DC

## 2020-09-08 MED ORDER — WARFARIN SODIUM 5 MG PO TABS
5.0000 mg | ORAL_TABLET | Freq: Once | ORAL | Status: AC
  Administered 2020-09-08: 5 mg via ORAL
  Filled 2020-09-08: qty 1

## 2020-09-08 MED ORDER — POLYETHYLENE GLYCOL 3350 17 G PO PACK: 17.0000 g | PACK | Freq: Every day | ORAL | 0 refills | Status: AC | PRN

## 2020-09-08 MED ORDER — FUROSEMIDE 40 MG PO TABS: 20.0000 mg | ORAL_TABLET | Freq: Every day | ORAL | Status: AC

## 2020-09-08 MED ORDER — CHLORHEXIDINE GLUCONATE CLOTH 2 % EX PADS
6.0000 | MEDICATED_PAD | Freq: Every day | CUTANEOUS | Status: DC
  Administered 2020-09-08: 6 via TOPICAL

## 2020-09-08 MED ORDER — HYDROCODONE-ACETAMINOPHEN 5-325 MG PO TABS: 1.0000 | ORAL_TABLET | Freq: Four times a day (QID) | ORAL | 0 refills | Status: AC | PRN

## 2020-09-08 NOTE — TOC Progression Note (Signed)
Transition of Care [facility]) - Progression Note    Patient Details  Name: [name] MRN: [id number] Date of Birth: 1926/06/12  Transition of Care [facility] CM/SW Contact  Terrial Rhodes, LCSWA Phone Number: 09/08/2020, 11:59 AM  Clinical Narrative:     CSW received insurance approval for patient. Plan auth ID# [id number] the [facility] ID# [id number]. Insurance approval from 9/1-9/6. Next review date is 9/6. Patient has SNF bed at [facility] when medically ready. CSW informed MD. Covid requested.CSW will continue to follow and assist with dc planning needs.  Expected Discharge Plan: Skilled Nursing Facility Barriers to Discharge: Continued Medical Work up  Expected Discharge Plan and Services Expected Discharge Plan: Skilled Nursing Facility In-house Referral: Clinical Social Work     Living arrangements for the past 2 months: Single Family Home Expected Discharge Date: 09/08/20                                     Social Determinants of Health (SDOH) Interventions    Readmission Risk Interventions No flowsheet data found.

## 2020-09-08 NOTE — Progress Notes (Signed)
ANTICOAGULATION CONSULT NOTE  Pharmacy Consult for warfarin Indication: atrial fibrillation  Allergies  Allergen Reactions   Bactrim [Sulfamethoxazole-Trimethoprim] Nausea Only and Other (See Comments)    Headaches, also (per other chart in Epic)   Codeine Nausea Only    Per other chart in Epic   Contrast Media [Iodinated Diagnostic Agents] Other (See Comments)    Per other chart in Epic-  reaction not cited   Gabapentin Nausea And Vomiting and Other (See Comments)    Per the other chart in Epic: "The patient did not fell like herself, also."   Lactose Intolerance (Gi) Diarrhea and Nausea And Vomiting   Macrobid [Nitrofurantoin] Other (See Comments)    "Allergy" noted in other chart in Epic-  reaction not cited   Other Other (See Comments)    Poor air quality and seasonal allergies- Congestion, itchy eyes, runny nose   Penicillins Nausea And Vomiting and Other (See Comments)    Reaction in other chart in Epic (N/V)   Tape Other (See Comments)    SKIN IS VERY THIN AND WILL TEAR AND BRUISE EASILY!!!    Patient Measurements: Height: 5\' 6"  (167.6 cm) Weight: 71.7 kg (158 lb) IBW/kg (Calculated) : 59.3   Vital Signs: Temp: 97.7 F (36.5 C) (09/01 1017) Temp Source: Oral (09/01 1017) BP: 102/58 (09/01 1108) Pulse Rate: 73 (09/01 1017)  Labs: Recent Labs    09/06/20 0129 09/07/20 0145 09/07/20 1514  HGB 12.3 12.5  --   HCT 37.4 37.2  --   PLT 81* 84*  --   LABPROT  --   --  15.3*  INR  --   --  1.2  CREATININE 0.91  --   --      Estimated Creatinine Clearance: 40.9 mL/min (by C-G formula based on SCr of 0.91 mg/dL).   Medical History: Past Medical History:  Diagnosis Date   Essential hypertension 09/04/2020   Peripheral polyneuropathy 09/04/2020   Permanent atrial fibrillation (HCC) 09/04/2020   Vitamin B12 deficiency 09/04/2020    Medications:  Medications Prior to Admission  Medication Sig Dispense Refill Last Dose   Cholecalciferol (VITAMIN D-3 PO) Take 1  capsule by mouth daily.   09/04/2020   Cyanocobalamin (VITAMIN B-12 PO) Take 1 tablet by mouth daily.   09/04/2020   Ferrous Gluconate 239 (27 Fe) MG TABS Take 27 mg by mouth daily with breakfast.   09/04/2020 at am   loratadine (CLARITIN) 10 MG tablet Take 10 mg by mouth daily.   09/04/2020   metoprolol tartrate (LOPRESSOR) 100 MG tablet Take 100 mg by mouth 2 (two) times daily.   09/04/2020 at 0600   Multiple Vitamins-Minerals (PRESERVISION/LUTEIN) CAPS Take 1 capsule by mouth daily with breakfast.   09/04/2020 at am   nystatin cream (MYCOSTATIN) Apply 1 application topically 2 (two) times daily as needed (irritation on the chest).   Past Week   sodium fluoride (FLUORISHIELD) 1.1 % GEL dental gel Place 1 application onto teeth at bedtime.   09/03/2020 at pm   SYSTANE ULTRA PF 0.4-0.3 % SOLN Place 1 drop into both eyes in the morning, at noon, and at bedtime.   09/04/2020   [DISCONTINUED] furosemide (LASIX) 40 MG tablet Take 40 mg by mouth in the morning and at bedtime.   09/04/2020 at am   [DISCONTINUED] potassium chloride (KLOR-CON) 10 MEQ tablet Take 10 mEq by mouth 3 (three) times daily.   09/04/2020 at am   [DISCONTINUED] warfarin (COUMADIN) 5 MG tablet Take 2.5-5 mg by mouth  See admin instructions. Take 5 mg by mouth in the morning on Sun/Tues/Wed/Fri/Sat and 2.5 mg on Mon/Thurs   ON HOLD   Scheduled:   Chlorhexidine Gluconate Cloth  6 each Topical Daily   docusate sodium  100 mg Oral BID   feeding supplement  237 mL Oral BID BM   loratadine  10 mg Oral Daily   metoprolol tartrate  100 mg Oral BID   multivitamin with minerals  1 tablet Oral Daily   mupirocin ointment  1 application Nasal BID   Warfarin - Pharmacist Dosing Inpatient   Does not apply q1600    Assessment: 87 yo female with femoral neck fracture s/p OR 8/29. She was on warfarin PTA for afib and pharmacy consulted to resume on 8/31 -INR= 1.2 on 8/28 -on 8/31 hg= 12.5, plt= 85 (admit was 101 on 8/28)  Home warfarin dose: 5mg /day  except take 2.5mg  MoTh  Goal of Therapy:  INR 2-3 Monitor platelets by anticoagulation protocol: Yes   Plan:  -Warfarin 5mg  po today -Daily PT/INR -CBC in am  , PharmD Clinical Pharmacist **Pharmacist phone directory can now be found on [email or web address] ([email or web address]).  Listed under [facility].

## 2020-09-08 NOTE — Progress Notes (Signed)
Report attempted x2

## 2020-09-08 NOTE — Progress Notes (Signed)
Physical Therapy Treatment Patient Details Name: [name] MRN: [id number] DOB: 04-Jun-1926 Today's Date: 09/08/2020    History of Present Illness 87 year old female presents to [facility] emergency department via EMS after being found status post fall by her son. Admitted 8/28 and underwent 8/29 L hip hemiarthroplasty, CT head chronic ischemic microangiopathy. Cervical spine MRI no cord compression PMH:  Paschal history of systolic congestive heart failure (Echo 03/2012 EF 20%), vitamin B12 deficiency , permanent atrial fibrillation (on coumadin), hypertension, benign positional vertigo.    PT Comments    Focused session on progressing pt's independence with bed mobility and transfers and increasing pt's tolerance to weight bearing on her L leg. Pt was able to successfully come to stand 3x in the stedy from an elevated bed surface with modA, +2 for safety. She also only needed modA to transition supine > sit with HOB elevated this date. Pt became increasingly fatigued, dizzy, and confused with multiple standing reps, thus returned pt to supine with BP reading 95/70, RN made aware. Pt may have orthostatic hypotension and would benefit from her BP being monitored with changes in position for safety measures. Will continue to follow acutely. Current recommendations remain appropriate.    Follow Up Recommendations  SNF     Equipment Recommendations  Rolling walker with 5" wheels    Recommendations for Other Services       Precautions / Restrictions Precautions Precautions: Fall;Posterior Hip Precaution Booklet Issued: Yes (comment) Precaution Comments: monitor BP, s/p L hip hemiarthroplasty, pt with poor to no recall of precautions,  3 major falls since 10/21, R wrist fx Restrictions Weight Bearing Restrictions: No LLE Weight Bearing: Weight bearing as tolerated    Mobility  Bed Mobility Overal bed mobility: Needs Assistance Bed Mobility: Sit to Supine;Supine to Sit      Supine to sit: HOB elevated;Mod assist Sit to supine: Max assist;+2 for physical assistance   General bed mobility comments: Cues to manage legs laterally of L EOB with some assistance, pt pulling on therapist's hands to ascend trunk, modA with HOB elevated. MaxAx2 to manage trunk and legs with return to supine.    Transfers Overall transfer level: Needs assistance Equipment used: Ambulation equipment used Transfers: Sit to/from Stand Sit to Stand: Mod assist;From elevated surface;+2 safety/equipment         General transfer comment: Sit to stand from elevated EOB to stedy 3x with modA, +2 for safety. Pt tends to stand with trunk flexed over stedy bar, needing physical assistance to extend trunk and hips to stand upright.  Ambulation/Gait             General Gait Details: unable   Stairs             Wheelchair Mobility    Modified Rankin (Stroke Patients Only)       Balance Overall balance assessment: Needs assistance Sitting-balance support: Feet supported;Bilateral upper extremity supported;Single extremity supported Sitting balance-Leahy Scale: Poor Sitting balance - Comments: Requires UE support and min guard-minA to sit statically EOB.   Standing balance support: Bilateral upper extremity supported Standing balance-Leahy Scale: Poor Standing balance comment: Reliant on bil UE support and external physical assistance to stand x3 reps in stedy for periods of ~15-45 sec each.                            Cognition Arousal/Alertness: Awake/alert Behavior During Therapy: WFL for tasks assessed/performed Overall Cognitive Status: Impaired/Different from baseline Area  of Impairment: Memory;Following commands;Safety/judgement;Awareness;Problem solving;Attention                   Current Attention Level: Sustained Memory: Decreased short-term memory;Decreased recall of precautions Following Commands: Follows one step commands with increased  time;Follows multi-step commands with increased time Safety/Judgement: Decreased awareness of safety;Decreased awareness of deficits Awareness: Emergent Problem Solving: Slow processing;Decreased initiation;Requires verbal cues;Requires tactile cues General Comments: pt requires increased multimodal cuing for mobility. Pt beca,e dizzy and sleepy after several stands and was thus returned to supine and pt became confused asking "who's talking to me?" with eyes closed, needing cues to open eyes to idenitfy that it was still the PT speaking with her. BP 95/70 supine end of session, notified RN      Exercises      General Comments General comments (skin integrity, edema, etc.): Pt became fatigued and dizzy sitting EOB after standing several times and became more confused, thus returned pt to supine with BP reading 95/70 SpO2 93% on 1.5L O2 via [place] and HR 70-90s, RN made aware      Pertinent Vitals/Pain Pain Assessment: Faces Faces Pain Scale: Hurts little more Pain Location: L hip and leg Pain Descriptors / Indicators: Grimacing;Guarding;Operative site guarding;Moaning Pain Intervention(s): Monitored during session;Limited activity within patient's tolerance;Repositioned    Home Living                      Prior Function            PT Goals (current goals can now be found in the care plan section) Acute Rehab PT Goals Patient Stated Goal: to get better PT Goal Formulation: With patient Time For Goal Achievement: 09/20/20 Potential to Achieve Goals: Fair Progress towards PT goals: Progressing toward goals    Frequency    Min 3X/week      PT Plan Current plan remains appropriate    Co-evaluation              AM-PAC PT "6 Clicks" Mobility   Outcome Measure  Help needed turning from your back to your side while in a flat bed without using bedrails?: A Lot Help needed moving from lying on your back to sitting on the side of a flat bed without using bedrails?: A  Lot Help needed moving to and from a bed to a chair (including a wheelchair)?: Total Help needed standing up from a chair using your arms (e.g., wheelchair or bedside chair)?: A Lot Help needed to walk in hospital room?: Total Help needed climbing 3-5 steps with a railing? : Total 6 Click Score: 9    End of Session Equipment Utilized During Treatment: Gait belt Activity Tolerance: Patient limited by fatigue Patient left: in bed;with bed alarm set;with call bell/phone within reach Nurse Communication: Mobility status;Other (comment) (vitals) PT Visit Diagnosis: Unsteadiness on feet (R26.81);Other abnormalities of gait and mobility (R26.89);Muscle weakness (generalized) (M62.81);Repeated falls (R29.6);History of falling (Z91.81);Difficulty in walking, not elsewhere classified (R26.2);Adult, failure to thrive (R62.7);Pain Pain - Right/Left: Left Pain - part of body: Hip;Leg     Time: 7035-0093 PT Time Calculation (min) (ACUTE ONLY): 22 min  Charges:  $Therapeutic Activity: 8-22 mins                     Raymond Gurney, PT, DPT Acute Rehabilitation Services  Pager: [phone] Office: [phone]    Jewel Baize 09/08/2020, 3:50 PM

## 2020-09-08 NOTE — TOC Transition Note (Signed)
Transition of Care [name]) - CM/SW Discharge Note   Patient Details  Name: [name] MRN: [id number] Date of Birth: Aug 27, 1926  [facility] CM/SW Contact:  [name], LCSWA Phone Number: 09/08/2020, 1:20 PM   Clinical Narrative:     Patient will DC to: [facility]  Anticipated DC date: 09/08/2020  Family notified: [name]  Transport by: [name]  ?  Per MD patient ready for DC to [facility]. RN, patient, patient's family, and facility notified of DC. Discharge Summary sent to facility. RN given number for report tele#[phone] RM#[id number]. DC packet on chart. DNR signed by MD attached to patients DC packet.Ambulance transport requested for patient.  CSW signing off.   Final next level of care: Skilled Nursing Facility Barriers to Discharge: No Barriers Identified   Patient Goals and CMS Choice   CMS Medicare.gov Compare Post Acute Care list provided to:: Patient Represenative (must comment) (patients son [name]) Choice offered to / list presented to : Adult Children (spoke with patients son [name])  Discharge Placement              Patient chooses bed at: [facility] Patient to be transferred to facility by: [facility] Name of family member notified: [name] Patient and family notified of of transfer: 09/08/20  Discharge Plan and Services In-house Referral: Clinical Social Work                                   Social Determinants of Health (SDOH) Interventions     Readmission Risk Interventions No flowsheet data found.

## 2020-09-08 NOTE — Progress Notes (Signed)
Attempted to call report to nurse at [facility]. No answer, message left.

## 2020-09-08 NOTE — Discharge Summary (Addendum)
Physician Discharge Summary  [name] VWU:[id number] DOB: [birth year] DOA: 09/04/2020  PCP: [name] Covey, MD  Admit date: 09/04/2020 Discharge date: 09/08/2020  Time spent: 35 minutes  Recommendations for Outpatient Follow-up:  Attempt to remove Foley catheter/voiding trial in 1 week once more ambulatory Titrate Coumadin dose for INR 2-3 Orthopedics Dr. Charlann Boxer in 2 weeks Urology as needed for Renal mass   Discharge Diagnoses:  Principal Problem:   Closed fracture of left hip [facility]) Active Problems:   Permanent atrial fibrillation (HCC)   Peripheral polyneuropathy   Herniation of intervertebral disc at C4-C5 level   Essential hypertension   Vitamin B12 deficiency   Right renal mass   Acute urinary retention   Chronic systolic CHF (congestive heart failure) (HCC)   Acute respiratory failure with hypoxia (HCC)   S/P left hip hemiarthroplasty   Discharge Condition: stable  Diet recommendation: low sodium  Filed Weights   09/04/20 1858  Weight: 71.7 kg    History of present illness:  87 year old female with PMH significant for systolic heart failure, atrial fibrillation on Coumadin, hypertension, B12 deficiency, benign positional vertigo presented s/p mechanical fall.  Apparently patient had an unwitnessed fall.  She was found by her son with severe pain in her left lower extremity and left hip.   x-ray shows left femoral neck fractureCT head chronic ischemic microangiopathy,  no fracture or subluxation of cervical spine.  Cervical spine noted disc herniation, MRI no cord compression. Patient is admitted s/p fall with femoral neck fracture.  Hospital Course:   Left femoral neck fracture: Patient presented s/p fall, x-ray shows left femoral neck fracture. Ortho consulted, patient underwent left hip hemiarthroplasty tolerated well. Weightbearing as tolerated. PT and OT recommended skilled nursing. -Coumadin restarted, she was on this prior to admission Outpatient  follow-up with Dr. Charlann Boxer in 2 weeks.   Acute urinary retention: -Developed urinary retention postop requiring Foley catheter, this was then removed -Foley had to be replaced overnight due to recurrent retention -Will need voiding trial in 1 week once more ambulatory   Permanent atrial fibrillation: Heart rate controlled with metoprolol, Patient on warfarin at home. Resume Coumadin    Chronic systolic heart failure: No signs of clinical volume overload. Recent echocardiogram LV EF 35%.  Severe hypokinesis of septal wall and inferior lateral wall. -Discontinued IV fluids, clinically appears euvolemic -resume lasix  daily at discharge   Cervical disc disease MRI C-spine noted disc herniation C4/C5, no cord compression -No symptoms, d/w neurosurgery on call, no further workup recommended   Right renal mass -Incidentally noted on imaging, follow-up with urology, doubt she is a surgical candidate given advanced age and frailty   Code Status: DNR  Discharge Exam: Vitals:   09/08/20 0741 09/08/20 1017  BP: 117/76 (!) 85/61  Pulse: 85 73  Resp: 15 19  Temp: 98 F (36.7 C) 97.7 F (36.5 C)  SpO2: 97%     General: Awake alert, oriented to self and place, mild cognitive deficits Cardiovascular: S1-S2, regular rate rhythm Respiratory: Clear  Discharge Instructions   Discharge Instructions     Diet - low sodium heart healthy   Complete by: As directed    Discharge wound care:   Complete by: As directed    Reinforce dressing as needed   Increase activity slowly   Complete by: As directed       Allergies as of 09/08/2020       Reactions   Bactrim [sulfamethoxazole-trimethoprim] Nausea Only, Other (See Comments)  Headaches, also (per other chart in Epic)   Codeine Nausea Only   Per other chart in Epic   Contrast Media [iodinated Diagnostic Agents] Other (See Comments)   Per other chart in Epic-  reaction not cited   Gabapentin Nausea And Vomiting, Other (See  Comments)   Per the other chart in Epic: "The patient did not fell like herself, also."   Lactose Intolerance (gi) Diarrhea, Nausea And Vomiting   Macrobid [nitrofurantoin] Other (See Comments)   "Allergy" noted in other chart in Epic-  reaction not cited   Other Other (See Comments)   Poor air quality and seasonal allergies- Congestion, itchy eyes, runny nose   Penicillins Nausea And Vomiting, Other (See Comments)   Reaction in other chart in Epic (N/V)   Tape Other (See Comments)   SKIN IS VERY THIN AND WILL TEAR AND BRUISE EASILY!!!        Medication List     TAKE these medications    Ferrous Gluconate 239 (27 Fe) MG Tabs Take 27 mg by mouth daily with breakfast.   furosemide 40 MG tablet Commonly known as: LASIX Take 0.5 tablets (20 mg total) by mouth daily. What changed:  how much to take when to take this   HYDROcodone-acetaminophen 5-325 MG tablet Commonly known as: NORCO/VICODIN Take 1 tablet by mouth every 6 (six) hours as needed for moderate pain.   loratadine 10 MG tablet Commonly known as: CLARITIN Take 10 mg by mouth daily.   metoprolol tartrate 100 MG tablet Commonly known as: LOPRESSOR Take 100 mg by mouth 2 (two) times daily.   nystatin cream Commonly known as: MYCOSTATIN Apply 1 application topically 2 (two) times daily as needed (irritation on the chest).   polyethylene glycol 17 g packet Commonly known as: MIRALAX / GLYCOLAX Take 17 g by mouth daily as needed for mild constipation.   potassium chloride 10 MEQ tablet Commonly known as: KLOR-CON Take 1 tablet (10 mEq total) by mouth daily. What changed: when to take this   PreserVision/Lutein Caps Take 1 capsule by mouth daily with breakfast.   sodium fluoride 1.1 % Gel dental gel Commonly known as: FLUORISHIELD Place 1 application onto teeth at bedtime.   Systane Ultra PF 0.4-0.3 % Soln Generic drug: Polyethyl Glyc-Propyl Glyc PF Place 1 drop into both eyes in the morning, at noon, and  at bedtime.   VITAMIN B-12 PO Take 1 tablet by mouth daily.   VITAMIN D-3 PO Take 1 capsule by mouth daily.   warfarin 5 MG tablet Commonly known as: COUMADIN Take 0.5-1 tablets (2.5-5 mg total) by mouth See admin instructions. Take  for next 3days then 5 mg by mouth in the morning on Sun/Tues/Wed/Fri/Sat and 2.5 mg on Mon/Thurs What changed: additional instructions               Discharge Care Instructions  (From admission, onward)           Start     Ordered   09/08/20 0000  Discharge wound care:       Comments: Reinforce dressing as needed   09/08/20 1041           Allergies  Allergen Reactions   Bactrim [Sulfamethoxazole-Trimethoprim] Nausea Only and Other (See Comments)    Headaches, also (per other chart in Epic)   Codeine Nausea Only    Per other chart in Epic   Contrast Media [Iodinated Diagnostic Agents] Other (See Comments)    Per other chart in Epic-  reaction not cited   Gabapentin Nausea And Vomiting and Other (See Comments)    Per the other chart in Epic: "The patient did not fell like herself, also."   Lactose Intolerance (Gi) Diarrhea and Nausea And Vomiting   Macrobid [Nitrofurantoin] Other (See Comments)    "Allergy" noted in other chart in Epic-  reaction not cited   Other Other (See Comments)    Poor air quality and seasonal allergies- Congestion, itchy eyes, runny nose   Penicillins Nausea And Vomiting and Other (See Comments)    Reaction in other chart in Epic (N/V)   Tape Other (See Comments)    SKIN IS VERY THIN AND WILL TEAR AND BRUISE EASILY!!!    Follow-up Information     Durene Romans, MD. Schedule an appointment as soon as possible for a visit in 2 week(s).   Specialty: Orthopedic Surgery Contact information: [address] [place] [address] [phone]                  The results of significant diagnostics from this hospitalization (including imaging, microbiology, ancillary and  laboratory) are listed below for reference.    Significant Diagnostic Studies: DG Wrist Complete Right  Result Date: 09/04/2020 CLINICAL DATA:  Status post fall. EXAM: RIGHT WRIST - COMPLETE 3+ VIEW COMPARISON:  None. FINDINGS: Chronic fracture deformities are seen involving the distal right radius and right ulnar styloid. There is no evidence of dislocation. Diffuse osteopenia is seen. Degenerative changes are noted throughout the right wrist. There is diffuse soft tissue swelling. IMPRESSION: Chronic fracture deformities of the distal right radius and right ulnar styloid. Electronically Signed   By: Aram Candela M.D.   On: 09/04/2020 21:45   CT HEAD WO CONTRAST ( )  Result Date: 09/04/2020 CLINICAL DATA:  Fall EXAM: CT HEAD WITHOUT CONTRAST CT CERVICAL SPINE WITHOUT CONTRAST TECHNIQUE: Multidetector CT imaging of the head and cervical spine was performed following the standard protocol without intravenous contrast. Multiplanar CT image reconstructions of the cervical spine were also generated. COMPARISON:  None. FINDINGS: CT HEAD FINDINGS Brain: There is no mass, hemorrhage or extra-axial collection. There is generalized atrophy without lobar predilection. There is hypoattenuation of the periventricular white matter, most commonly indicating chronic ischemic microangiopathy. Vascular: No abnormal hyperdensity of the major intracranial arteries or dural venous sinuses. No intracranial atherosclerosis. Skull: The visualized skull base, calvarium and extracranial soft tissues are normal. Sinuses/Orbits: No fluid levels or advanced mucosal thickening of the visualized paranasal sinuses. No mastoid or middle ear effusion. The orbits are normal. CT CERVICAL SPINE FINDINGS Alignment: No static subluxation. Facets are aligned. Occipital condyles are normally positioned. Skull base and vertebrae: No acute fracture. Soft tissues and spinal canal: No prevertebral fluid or swelling. No visible canal hematoma.  Disc levels: Large central disc protrusion at C4-5 indenting the ventral spinal cord. Upper chest: No pneumothorax, pulmonary nodule or pleural effusion. Other: Normal visualized paraspinal cervical soft tissues. IMPRESSION: 1. Chronic ischemic microangiopathy and generalized atrophy without acute intracranial abnormality. 2. No acute fracture or static subluxation of the cervical spine. 3. Large central disc protrusion at C4-5 indenting the ventral spinal cord. Electronically Signed   By: Deatra Robinson M.D.   On: 09/04/2020 20:03   CT Cervical Spine Wo Contrast  Result Date: 09/04/2020 CLINICAL DATA:  Fall EXAM: CT HEAD WITHOUT CONTRAST CT CERVICAL SPINE WITHOUT CONTRAST TECHNIQUE: Multidetector CT imaging of the head and cervical spine was performed following the standard protocol without intravenous contrast. Multiplanar CT image  reconstructions of the cervical spine were also generated. COMPARISON:  None. FINDINGS: CT HEAD FINDINGS Brain: There is no mass, hemorrhage or extra-axial collection. There is generalized atrophy without lobar predilection. There is hypoattenuation of the periventricular white matter, most commonly indicating chronic ischemic microangiopathy. Vascular: No abnormal hyperdensity of the major intracranial arteries or dural venous sinuses. No intracranial atherosclerosis. Skull: The visualized skull base, calvarium and extracranial soft tissues are normal. Sinuses/Orbits: No fluid levels or advanced mucosal thickening of the visualized paranasal sinuses. No mastoid or middle ear effusion. The orbits are normal. CT CERVICAL SPINE FINDINGS Alignment: No static subluxation. Facets are aligned. Occipital condyles are normally positioned. Skull base and vertebrae: No acute fracture. Soft tissues and spinal canal: No prevertebral fluid or swelling. No visible canal hematoma. Disc levels: Large central disc protrusion at C4-5 indenting the ventral spinal cord. Upper chest: No pneumothorax,  pulmonary nodule or pleural effusion. Other: Normal visualized paraspinal cervical soft tissues. IMPRESSION: 1. Chronic ischemic microangiopathy and generalized atrophy without acute intracranial abnormality. 2. No acute fracture or static subluxation of the cervical spine. 3. Large central disc protrusion at C4-5 indenting the ventral spinal cord. Electronically Signed   By: Deatra Robinson M.D.   On: 09/04/2020 20:03   CT Lumbar Spine Wo Contrast  Result Date: 09/04/2020 CLINICAL DATA:  Fall EXAM: CT LUMBAR SPINE WITHOUT CONTRAST TECHNIQUE: Multidetector CT imaging of the lumbar spine was performed without intravenous contrast administration. Multiplanar CT image reconstructions were also generated. COMPARISON:  None. FINDINGS: Segmentation: 5 lumbar type vertebrae. Alignment: Normal. Vertebrae: No acute fracture or focal pathologic process. Paraspinal and other soft tissues: Calcific aortic atherosclerosis. Intermediate density right renal exophytic lesion measuring 3.0 cm. Disc levels: Multilevel degenerative disc disease without spinal canal or neural foraminal stenosis. IMPRESSION: 1. No acute fracture or static subluxation of the lumbar spine. 2. Multilevel degenerative disc disease without spinal canal or neural foraminal stenosis. 3. Intermediate density right renal exophytic lesion measuring 3.0 cm. Multiphase renal mass protocol CT or MRI of the abdomen with and without contrast is recommended for complete characterization to exclude renal carcinoma and could be obtained non-emergently. Aortic Atherosclerosis (ICD10-I70.0). Electronically Signed   By: Deatra Robinson M.D.   On: 09/04/2020 19:59   MR CERVICAL SPINE W WO CONTRAST  Result Date: 09/05/2020 CLINICAL DATA:  Cord compression EXAM: MRI CERVICAL SPINE WITHOUT AND WITH CONTRAST TECHNIQUE: Multiplanar and multiecho pulse sequences of the cervical spine, to include the craniocervical junction and cervicothoracic junction, were obtained without  and with intravenous contrast. CONTRAST:  91mL GADAVIST GADOBUTROL 1 MMOL/ML IV SOLN COMPARISON:  None. FINDINGS: Alignment: Physiologic. Vertebrae: No fracture, evidence of discitis, or bone lesion. Cord: Normal signal and morphology. Posterior Fossa, vertebral arteries, paraspinal tissues: Negative. Disc levels: C1-2: Unremarkable. C2-3: Normal disc space and facet joints. There is no spinal canal stenosis. No neural foraminal stenosis. C3-4: Normal disc space and facet joints. There is no spinal canal stenosis. No neural foraminal stenosis. C4-5: Intermediate sized central disc extrusion with inferior migration effacing the ventral thecal sac and contacting the spinal cord. Mild spinal canal stenosis. No neural foraminal stenosis. C5-6: Small disc bulge with uncovertebral spurring. There is no spinal canal stenosis. Moderate bilateral neural foraminal stenosis. C6-7: Small central disc protrusion. There is no spinal canal stenosis. No neural foraminal stenosis. C7-T1: Normal disc space and facet joints. There is no spinal canal stenosis. No neural foraminal stenosis. IMPRESSION: 1. No cord compression. 2. Mild spinal canal stenosis at C4-5 secondary to intermediate sized  central disc extrusion with inferior migration. 3. Moderate bilateral neural foraminal stenosis at C5-6. Electronically Signed   By: Deatra Robinson M.D.   On: 09/05/2020 03:18   DG Pelvis Portable  Result Date: 09/05/2020 CLINICAL DATA:  Status post hip arthroplasty EXAM: PORTABLE PELVIS 1-2 VIEWS COMPARISON:  09/04/2020 FINDINGS: Interval left hip replacement with intact hardware and normal alignment. Gas in the soft tissues consistent with recent surgery IMPRESSION: Left hip arthroplasty with expected postsurgical change Electronically Signed   By: Jasmine Pang M.D.   On: 09/05/2020 20:33   DG Pelvis Portable  Result Date: 09/04/2020 CLINICAL DATA:  Status post fall EXAM: PORTABLE PELVIS 1-2 VIEWS; DG HIP (WITH OR WITHOUT PELVIS) 2-3V  LEFT COMPARISON:  None. FINDINGS: Left femoral neck fracture. No left hip dislocation. Frontal view of the right hip with no definite displaced fracture. No acute displaced fracture or diastasis of the bones of the pelvis. Degenerative changes of the lumbar spine. Limited evaluation of the sacrum due to overlying bowel gas. IMPRESSION: 1. Left femoral neck fracture. 2. No left hip dislocation. 3. No acute displaced fracture or dislocation of the right hip on frontal view. 4. No acute displaced fracture or diastasis of the bones of the pelvis. Electronically Signed   By: Tish Frederickson M.D.   On: 09/04/2020 19:28   DG Chest Portable 1 View  Result Date: 09/04/2020 CLINICAL DATA:  Fall. EXAM: PORTABLE CHEST 1 VIEW COMPARISON:  February 02, 2011 FINDINGS: No pneumothorax. Stable cardiomegaly. The hila and mediastinum are unremarkable. Kerley B-lines are identified in the periphery of the left lung suggesting mild edema. No suspicious nodule, mass, or focal infiltrate. IMPRESSION: Cardiomegaly and probable mild pulmonary edema. No other acute abnormalities are identified. Electronically Signed   By: Gerome Sam III M.D.   On: 09/04/2020 19:28   ECHOCARDIOGRAM COMPLETE  Result Date: 09/05/2020    ECHOCARDIOGRAM REPORT   Patient Name:   [name] Date of Exam: 09/05/2020 Medical Rec #:  [id number]        Height:       66.0 in Accession #:    [id number]       Weight:       158.0 lb Date of Birth:  1926/11/26        BSA:          1.809 m Patient Age:    89 years         BP:           106/65 mmHg Patient Gender: F                HR:           95 bpm. Exam Location:  Inpatient Procedure: 2D Echo, Cardiac Doppler and Color Doppler Indications:    CHF  History:        Patient has no prior history of Echocardiogram examinations.                 CHF, Arrythmias:Atrial Fibrillation; Risk Factors:Hypertension.  Sonographer:    Neomia Dear RDCS Referring Phys: [id number] Deno Lunger [place] IMPRESSIONS  1. Left  ventricular ejection fraction, by estimation, is 35%. The left ventricle has moderately decreased function. The left ventricle demonstrates regional wall motion abnormalities with severe hypokinesis of the septal wall and the inferolateral wall.  There is mild left ventricular hypertrophy. Left ventricular diastolic parameters are indeterminate.  2. Right ventricular systolic function is mildly reduced. The right ventricular size is moderately enlarged. There is severely  elevated pulmonary artery systolic pressure. The estimated right ventricular systolic pressure is 72.2 mmHg.  3. Left atrial size was moderately dilated.  4. Right atrial size was severely dilated.  5. The mitral valve is normal in structure. Trivial mitral valve regurgitation. No evidence of mitral stenosis.  6. Tricuspid valve regurgitation is moderate.  7. The aortic valve is tricuspid. Aortic valve regurgitation is trivial. Mild aortic valve sclerosis is present, with no evidence of aortic valve stenosis.  8. The inferior vena cava is dilated in size with <50% respiratory variability, suggesting right atrial pressure of 15 mmHg. FINDINGS  Left Ventricle: Left ventricular ejection fraction, by estimation, is 35%. The left ventricle has moderately decreased function. The left ventricle demonstrates regional wall motion abnormalities. The left ventricular internal cavity size was normal in size. There is mild left ventricular hypertrophy. Left ventricular diastolic parameters are indeterminate. Right Ventricle: The right ventricular size is moderately enlarged. No increase in right ventricular wall thickness. Right ventricular systolic function is mildly reduced. There is severely elevated pulmonary artery systolic pressure. The tricuspid regurgitant velocity is 3.78 m/s, and with an assumed right atrial pressure of 15 mmHg, the estimated right ventricular systolic pressure is 72.2 mmHg. Left Atrium: Left atrial size was moderately dilated. Right  Atrium: Right atrial size was severely dilated. Pericardium: Trivial pericardial effusion is present. Mitral Valve: The mitral valve is normal in structure. Trivial mitral valve regurgitation. No evidence of mitral valve stenosis. Tricuspid Valve: The tricuspid valve is normal in structure. Tricuspid valve regurgitation is moderate. Aortic Valve: The aortic valve is tricuspid. Aortic valve regurgitation is trivial. Aortic regurgitation PHT measures 406 msec. Mild aortic valve sclerosis is present, with no evidence of aortic valve stenosis. Aortic valve mean gradient measures 3.5 mmHg. Aortic valve peak gradient measures 6.9 mmHg. Aortic valve area, by VTI measures 2.41 cm. Pulmonic Valve: The pulmonic valve was normal in structure. Pulmonic valve regurgitation is trivial. Aorta: The aortic root is normal in size and structure. Venous: The inferior vena cava is dilated in size with less than 50% respiratory variability, suggesting right atrial pressure of 15 mmHg. IAS/Shunts: No atrial level shunt detected by color flow Doppler.  LEFT VENTRICLE PLAX 2D LVIDd:         5.00 cm LVIDs:         4.70 cm LV PW:         1.40 cm LV IVS:        0.90 cm LVOT diam:     2.50 cm LV SV:         58 LV SV Index:   32 LVOT Area:     4.91 cm  LV Volumes (MOD) LV vol d, MOD A2C: 43.0 ml LV vol d, MOD A4C: 59.3 ml LV vol s, MOD A2C: 22.8 ml LV vol s, MOD A4C: 43.4 ml LV SV MOD A2C:     20.2 ml LV SV MOD A4C:     59.3 ml LV SV MOD BP:      22.8 ml RIGHT VENTRICLE RV Basal diam:  4.60 cm RV Mid diam:    2.60 cm TAPSE (M-mode): 1.1 cm LEFT ATRIUM             Index       RIGHT ATRIUM           Index LA Vol (A2C):   31.4 ml 17.36 ml/m RA Area:     28.50 cm LA Vol (A4C):   81.8 ml 45.21 ml/m RA Volume:  93.60 ml  51.74 ml/m LA Biplane Vol: 50.7 ml 28.02 ml/m  AORTIC VALVE                   PULMONIC VALVE AV Area (Vmax):    2.55 cm    PV Vmax:          0.85 m/s AV Area (Vmean):   2.68 cm    PV Vmean:         63.100 cm/s AV Area (VTI):      2.41 cm    PV VTI:           0.170 m AV Vmax:           131.00 cm/s PV Peak grad:     2.9 mmHg AV Vmean:          93.300 cm/s PV Mean grad:     2.0 mmHg AV VTI:            0.240 m     PR End Diast Vel: 3.66 msec AV Peak Grad:      6.9 mmHg AV Mean Grad:      3.5 mmHg LVOT Vmax:         68.00 cm/s LVOT Vmean:        50.900 cm/s LVOT VTI:          0.118 m LVOT/AV VTI ratio: 0.49 AI PHT:            406 msec  AORTA Ao Root diam: 3.30 cm Ao Asc diam:  3.60 cm MITRAL VALVE               TRICUSPID VALVE MV Area (PHT): 5.66 cm    TR Peak grad:   57.2 mmHg MV Decel Time: 134 msec    TR Vmax:        378.00 cm/s MR PISA:        0.57 cm MR PISA Radius: 0.30 cm    SHUNTS MV E velocity: 76.70 cm/s  Systemic VTI:  0.12 m                            Systemic Diam: 2.50 cm Dalton McleanMD Electronically signed by Wilfred Lacy Signature Date/Time: 09/05/2020/2:02:53 PM    Final    DG Hip Unilat W or Wo Pelvis 2-3 Views Left  Result Date: 09/04/2020 CLINICAL DATA:  Status post fall EXAM: PORTABLE PELVIS 1-2 VIEWS; DG HIP (WITH OR WITHOUT PELVIS) 2-3V LEFT COMPARISON:  None. FINDINGS: Left femoral neck fracture. No left hip dislocation. Frontal view of the right hip with no definite displaced fracture. No acute displaced fracture or diastasis of the bones of the pelvis. Degenerative changes of the lumbar spine. Limited evaluation of the sacrum due to overlying bowel gas. IMPRESSION: 1. Left femoral neck fracture. 2. No left hip dislocation. 3. No acute displaced fracture or dislocation of the right hip on frontal view. 4. No acute displaced fracture or diastasis of the bones of the pelvis. Electronically Signed   By: Tish Frederickson M.D.   On: 09/04/2020 19:28    Microbiology: Recent Results (from the past 240 hour(s))  Resp Panel by RT-PCR (Flu A&B, Covid) Nasopharyngeal Swab     Status: None   Collection Time: 09/04/20  6:51 PM   Specimen: Nasopharyngeal Swab; Nasopharyngeal(NP) swabs in vial transport medium   Result Value Ref Range Status   SARS Coronavirus 2 by RT PCR NEGATIVE NEGATIVE Final  Comment: (NOTE) SARS-CoV-2 target nucleic acids are NOT DETECTED.  The SARS-CoV-2 RNA is generally detectable in upper respiratory specimens during the acute phase of infection. The lowest concentration of SARS-CoV-2 viral copies this assay can detect is 138 copies/mL. A negative result does not preclude SARS-Cov-2 infection and should not be used as the sole basis for treatment or other patient management decisions. A negative result may occur with  improper specimen collection/handling, submission of specimen other than nasopharyngeal swab, presence of viral mutation(s) within the areas targeted by this assay, and inadequate number of viral copies(<138 copies/mL). A negative result must be combined with clinical observations, patient history, and epidemiological information. The expected result is Negative.  Fact Sheet for Patients:  BloggerCourse.com  Fact Sheet for Healthcare Providers:  SeriousBroker.it  This test is no t yet approved or cleared by the [place] FDA and  has been authorized for detection and/or diagnosis of SARS-CoV-2 by FDA under an Emergency Use Authorization (EUA). This EUA will remain  in effect (meaning this test can be used) for the duration of the COVID-19 declaration under Section 564(b)(1) of the Act, 21 U.S.C.section 360bbb-3(b)(1), unless the authorization is terminated  or revoked sooner.       Influenza A by PCR NEGATIVE NEGATIVE Final   Influenza B by PCR NEGATIVE NEGATIVE Final    Comment: (NOTE) The Xpert Xpress SARS-CoV-2/FLU/RSV plus assay is intended as an aid in the diagnosis of influenza from Nasopharyngeal swab specimens and should not be used as a sole basis for treatment. Nasal washings and aspirates are unacceptable for Xpert Xpress SARS-CoV-2/FLU/RSV testing.  Fact Sheet for  Patients: BloggerCourse.com  Fact Sheet for Healthcare Providers: SeriousBroker.it  This test is not yet approved or cleared by the [place] FDA and has been authorized for detection and/or diagnosis of SARS-CoV-2 by FDA under an Emergency Use Authorization (EUA). This EUA will remain in effect (meaning this test can be used) for the duration of the COVID-19 declaration under Section 564(b)(1) of the Act, 21 U.S.C. section 360bbb-3(b)(1), unless the authorization is terminated or revoked.  Performed at [facility], [address], [place], [place] [address]   Surgical pcr screen     Status: Abnormal   Collection Time: 09/05/20  3:09 PM   Specimen: Nasal Mucosa; Nasal Swab  Result Value Ref Range Status   MRSA, PCR NEGATIVE NEGATIVE Final   Staphylococcus aureus POSITIVE (A) NEGATIVE Final    Comment: (NOTE) The Xpert SA Assay (FDA approved for NASAL specimens in patients 48 years of age and older), is one component of a comprehensive surveillance program. It is not intended to diagnose infection nor to guide or monitor treatment. Performed at [facility], [address], [place], [place] [address]      Labs: Basic Metabolic Panel: Recent Labs  Lab 09/04/20 1820 09/05/20 0426 09/05/20 0434 09/05/20 0639 09/06/20 0129  NA 139 141 139 141 139  K 4.0 3.5 3.5 3.5 3.9  CL 103  --  102  --  104  CO2 25  --  27  --  26  GLUCOSE 166*  --  125*  --  153*  BUN 24*  --  22  --  23  CREATININE 1.02*  --  0.86  --  0.91  CALCIUM 9.2  --  9.0  --  8.5*   Liver Function Tests: Recent Labs  Lab 09/04/20 1820 09/05/20 0434  AST 47* 32  ALT 43 34  ALKPHOS 127*  109  BILITOT 2.2* 1.9*  PROT 7.1 6.3*  ALBUMIN 3.8 3.2*   No results for input(s): LIPASE, AMYLASE in the last 168 hours. No results for input(s): AMMONIA in the last 168 hours. CBC: Recent Labs  Lab 09/04/20 1820 09/05/20 0426 09/05/20 0434  09/05/20 0639 09/06/20 0129 09/07/20 0145  WBC 9.4  --  8.6  --  8.8 10.9*  NEUTROABS 8.0*  --  6.3  --   --   --   HGB 14.6 13.6 13.6 13.9 12.3 12.5  HCT 46.0 40.0 41.8 41.0 37.4 37.2  MCV 103.4*  --  100.7*  --  100.8* 100.0  PLT 101*  --  89*  --  81* 84*   Cardiac Enzymes: Recent Labs  Lab 09/04/20 1820  CKTOTAL 155   BNP: BNP (last 3 results) No results for input(s): BNP in the last 8760 hours.  ProBNP (last 3 results) No results for input(s): PROBNP in the last 8760 hours.  CBG: Recent Labs  Lab 09/06/20 2121  GLUCAP 174*       Signed:  Zannie CovePreetha Amiere Cawley MD.  [facility] 09/08/2020, 10:42 AM

## 2020-09-08 NOTE — Care Management Important Message (Signed)
Important Message  Patient Details  Name: [name] MRN: [id number] Date of Birth: 11/29/1926   Medicare Important Message Given:  Yes     Renie Ora 09/08/2020, 9:22 AM

## 2020-09-08 NOTE — Telephone Encounter (Signed)
Spoke with patient son Jonny Ruiz, message given.   Nothing further is  needed

## 2020-09-09 ENCOUNTER — Encounter: Payer: Self-pay | Admitting: Family Medicine

## 2020-09-09 ENCOUNTER — Telehealth: Payer: Self-pay | Admitting: Pharmacist

## 2020-09-09 DIAGNOSIS — I4891 Unspecified atrial fibrillation: Secondary | ICD-10-CM | POA: Diagnosis not present

## 2020-09-09 DIAGNOSIS — S72002S Fracture of unspecified part of neck of left femur, sequela: Secondary | ICD-10-CM | POA: Diagnosis not present

## 2020-09-09 DIAGNOSIS — G629 Polyneuropathy, unspecified: Secondary | ICD-10-CM | POA: Diagnosis not present

## 2020-09-09 DIAGNOSIS — R339 Retention of urine, unspecified: Secondary | ICD-10-CM | POA: Diagnosis not present

## 2020-09-09 DIAGNOSIS — I5022 Chronic systolic (congestive) heart failure: Secondary | ICD-10-CM | POA: Diagnosis not present

## 2020-09-09 DIAGNOSIS — R269 Unspecified abnormalities of gait and mobility: Secondary | ICD-10-CM | POA: Diagnosis not present

## 2020-09-09 DIAGNOSIS — I10 Essential (primary) hypertension: Secondary | ICD-10-CM | POA: Diagnosis not present

## 2020-09-09 DIAGNOSIS — D649 Anemia, unspecified: Secondary | ICD-10-CM | POA: Diagnosis not present

## 2020-09-09 DIAGNOSIS — S72002A Fracture of unspecified part of neck of left femur, initial encounter for closed fracture: Secondary | ICD-10-CM | POA: Diagnosis not present

## 2020-09-09 DIAGNOSIS — D519 Vitamin B12 deficiency anemia, unspecified: Secondary | ICD-10-CM | POA: Diagnosis not present

## 2020-09-09 DIAGNOSIS — M519 Unspecified thoracic, thoracolumbar and lumbosacral intervertebral disc disorder: Secondary | ICD-10-CM | POA: Diagnosis not present

## 2020-09-09 DIAGNOSIS — N2889 Other specified disorders of kidney and ureter: Secondary | ICD-10-CM | POA: Diagnosis not present

## 2020-09-09 DIAGNOSIS — K5900 Constipation, unspecified: Secondary | ICD-10-CM | POA: Diagnosis not present

## 2020-09-09 NOTE — Progress Notes (Signed)
Chronic Care Management Pharmacy Assistant   Name: [name]  MRN: [id number] DOB: 04/07/1926   Reason for Encounter: Disease State/ Hypertension Assessment Call   Conditions to be addressed/monitored: HTN  Recent office visits:  08-31-2020 [name], MD - Patient presented for Fall risk and other concerns. No medication changes  08-29-2020 Anti Coag visit - Patient presented for Anti Coag visit reading was 2.3  08-01-2020 Anti Coag visit - Patient presented for Anti Coag visit reading was 1.9  07-04-2020 Anti Coag visit - Patient presented for Anti Coag visit reading was 1.7  Recent consult visits:  08-12-2020 Helane Gunther (Podiatry) - Patient presented for Pain due to onychomycosis of toenails of both feet. No medication changes.  Hospital visits:  Medication Reconciliation was completed by comparing discharge summary, patient's EMR and Pharmacy list, and upon discussion with patient.  Patient presented to [facility] on 09-04-2020 due to Closed fracture of left hip. Discharge date was 09-08-2020  New?Medications Started at [facility] Discharge:?? -started  HYDROcodone-acetaminophen 5-325 MG Take 1 tablet by mouth every 6 (six) hours PRN  Medication Changes at Hospital Discharge: -Changed  Furosemide Take 0.5 tablets (20 mg total) by mouth daily.  Medications Discontinued at Hospital Discharge: -Stopped  None  Medications that remain the same after Hospital Discharge:??  -All other medications will remain the same.    Medications: Outpatient Encounter Medications as of 09/09/2020  Medication Sig   azelastine (OPTIVAR) 0.05 % ophthalmic solution INSTILL 1 TO 2 DROPS INTO EACH EYE ONCE DAILY AS NEEDED FOR ALLERGIES   Cholecalciferol (VITAMIN D-3 PO) Take 1 capsule by mouth daily.   cromolyn (OPTICROM) 4 % ophthalmic solution INSTILL 1 DROP INTO EACH EYE TWICE DAILY AS DIRECTED AS NEEDED FOR ALLERGIES   Cyanocobalamin (VITAMIN B-12 PO) Take 1  tablet by mouth daily.   Ferrous Gluconate (IRON 27 PO) Take 1 tablet by mouth daily.   Ferrous Gluconate 239 (27 Fe) MG TABS Take 27 mg by mouth daily with breakfast.   furosemide (LASIX) 40 MG tablet TAKE 1 TABLET BY MOUTH ONCE DAILY IN THE MORNING AND 1/2 (ONE-HALF) TABLET ONCE DAILY IN THE EVENING (Patient taking differently: 40 mg 2 (two) times daily. TAKE 1 TABLET BY MOUTH BY MOUTH TWICE DAILY)   furosemide (LASIX) 40 MG tablet Take 0.5 tablets (20 mg total) by mouth daily.   HYDROcodone-acetaminophen (NORCO/VICODIN) 5-325 MG tablet Take 1 tablet by mouth every 6 (six) hours as needed for moderate pain.   hydrocortisone cream 0.5 % Apply 1 application topically 2 (two) times daily.   loratadine (CLARITIN) 10 MG tablet Take 10 mg by mouth daily.   loratadine (CLARITIN) 10 MG tablet Take 10 mg by mouth daily.   metoprolol tartrate (LOPRESSOR) 100 MG tablet TAKE 1 TABLET BY MOUTH TWICE DAILY   metoprolol tartrate (LOPRESSOR) 100 MG tablet Take 100 mg by mouth 2 (two) times daily.   Multiple Vitamins-Minerals (PRESERVISION/LUTEIN) CAPS Take 1 capsule by mouth daily with breakfast.   nystatin cream (MYCOSTATIN) Apply 1 application topically 2 (two) times daily.   nystatin cream (MYCOSTATIN) Apply 1 application topically 2 (two) times daily as needed (irritation on the chest).   polyethylene glycol (MIRALAX / GLYCOLAX) 17 g packet Take 17 g by mouth daily as needed for mild constipation.   potassium chloride (KLOR-CON) 10 MEQ tablet TAKE 3 TABLETS BY MOUTH  DAILY   potassium chloride (KLOR-CON) 10 MEQ tablet Take 1 tablet (10 mEq total) by mouth daily.  sodium fluoride (FLUORISHIELD) 1.1 % GEL dental gel    sodium fluoride (FLUORISHIELD) 1.1 % GEL dental gel Place 1 application onto teeth at bedtime.   SYSTANE ULTRA PF 0.4-0.3 % SOLN Place 1 drop into both eyes in the morning, at noon, and at bedtime.   warfarin (COUMADIN) 5 MG tablet TAKE 1 TABLET BY MOUTH DAILY EXCEPT TAKE 1/2 TABLET ON  MONDAY, WEDNESDAY, AND FRIDAY OR TAKE AS DIRECTED BY ANTICOAGULATION CLINIC   warfarin (COUMADIN) 5 MG tablet Take 0.5-1 tablets (2.5-5 mg total) by mouth See admin instructions. Take 5mg  for next 3days then 5 mg by mouth in the morning on Sun/Tues/Wed/Fri/Sat and 2.5 mg on Mon/Thurs   No facility-administered encounter medications on file as of 09/09/2020.  Reviewed chart prior to disease state call. Spoke with patient regarding BP  Recent Office Vitals: BP Readings from Last 3 Encounters:  09/08/20 (!) 90/56  08/31/20 124/80  03/18/20 116/76   Pulse Readings from Last 3 Encounters:  09/08/20 86  08/31/20 76  03/18/20 82    Wt Readings from Last 3 Encounters:  09/04/20 158 lb (71.7 kg)  08/31/20 157 lb 4.8 oz (71.4 kg)  03/18/20 158 lb 9.6 oz (71.9 kg)     Kidney Function Lab Results  Component Value Date/Time   CREATININE 0.91 09/06/2020 01:29 AM   CREATININE 0.86 09/05/2020 04:34 AM   CREATININE 1.13 (H) 04/10/2013 08:16 AM   GFR 55.92 (L) 03/18/2020 09:09 AM   GFRNONAA 60 (L) 09/06/2020 01:29 AM   GFRAA 54 (L) 09/04/2017 08:37 AM    BMP Latest Ref Rng & Units 09/06/2020 09/05/2020 09/05/2020  Glucose 70 - 99 mg/dL 09/07/2020) - 532(D)  BUN 8 - 23 mg/dL 23 - 22  Creatinine 924(Q - 1.00 mg/dL 6.83 - 4.19  BUN/Creat Ratio 12 - 28 - - -  Sodium 135 - 145 mmol/L 139 141 139  Potassium 3.5 - 5.1 mmol/L 3.9 3.5 3.5  Chloride 98 - 111 mmol/L 104 - 102  CO2 22 - 32 mmol/L 26 - 27  Calcium 8.9 - 10.3 mg/dL 6.22) - 9.0   Spoke to patients son 2.9(N for the call  Current antihypertensive regimen:  Metoprolol tartrate 100mg , 1 tablet twice daily Any recent hospitalizations or ED visits since last visit with CPP? Yes, he reports that she was released from the hospital from a fall and transferred to [facility] a skilled Rehab facility on yesterday where she is due to be there for about 4 weeks. He reports he is concerned about her ability to cope with the pain medications she is currently  on. He noted she has always been sensitive to pain medications and does feel she may be getting too much medication for pain now.   Adherence Review: Is the patient currently on ACE/ARB medication? No Does the patient have >5 day gap between last estimated fill dates? No   Care Gaps: COVID Vaccines - Overdue Zoster Vaccines - Overdue TDAP - Overdue Flu Vaccine - Overdue CCM F/U - Scheduled 12-27-20 AWV - MSG sent to [facility] CMA to schedule.   Star Rating Drugs: None   12-29-20 CMA Clinical Pharmacist Assistant [phone]

## 2020-09-14 DIAGNOSIS — Z7901 Long term (current) use of anticoagulants: Secondary | ICD-10-CM | POA: Diagnosis not present

## 2020-09-14 DIAGNOSIS — G629 Polyneuropathy, unspecified: Secondary | ICD-10-CM | POA: Diagnosis not present

## 2020-09-14 DIAGNOSIS — I5022 Chronic systolic (congestive) heart failure: Secondary | ICD-10-CM | POA: Diagnosis not present

## 2020-09-14 DIAGNOSIS — R339 Retention of urine, unspecified: Secondary | ICD-10-CM | POA: Diagnosis not present

## 2020-09-14 DIAGNOSIS — I10 Essential (primary) hypertension: Secondary | ICD-10-CM | POA: Diagnosis not present

## 2020-09-14 DIAGNOSIS — S72002S Fracture of unspecified part of neck of left femur, sequela: Secondary | ICD-10-CM | POA: Diagnosis not present

## 2020-09-14 DIAGNOSIS — I4891 Unspecified atrial fibrillation: Secondary | ICD-10-CM | POA: Diagnosis not present

## 2020-09-19 DIAGNOSIS — G629 Polyneuropathy, unspecified: Secondary | ICD-10-CM | POA: Diagnosis not present

## 2020-09-19 DIAGNOSIS — R339 Retention of urine, unspecified: Secondary | ICD-10-CM | POA: Diagnosis not present

## 2020-09-19 DIAGNOSIS — I4891 Unspecified atrial fibrillation: Secondary | ICD-10-CM | POA: Diagnosis not present

## 2020-09-19 DIAGNOSIS — I5022 Chronic systolic (congestive) heart failure: Secondary | ICD-10-CM | POA: Diagnosis not present

## 2020-09-19 DIAGNOSIS — S72002S Fracture of unspecified part of neck of left femur, sequela: Secondary | ICD-10-CM | POA: Diagnosis not present

## 2020-09-19 DIAGNOSIS — J9690 Respiratory failure, unspecified, unspecified whether with hypoxia or hypercapnia: Secondary | ICD-10-CM | POA: Diagnosis not present

## 2020-09-21 DIAGNOSIS — Z7901 Long term (current) use of anticoagulants: Secondary | ICD-10-CM | POA: Diagnosis not present

## 2020-09-21 DIAGNOSIS — I10 Essential (primary) hypertension: Secondary | ICD-10-CM | POA: Diagnosis not present

## 2020-09-21 DIAGNOSIS — S72002S Fracture of unspecified part of neck of left femur, sequela: Secondary | ICD-10-CM | POA: Diagnosis not present

## 2020-09-21 DIAGNOSIS — I4891 Unspecified atrial fibrillation: Secondary | ICD-10-CM | POA: Diagnosis not present

## 2020-09-21 DIAGNOSIS — I5022 Chronic systolic (congestive) heart failure: Secondary | ICD-10-CM | POA: Diagnosis not present

## 2020-09-21 DIAGNOSIS — J9690 Respiratory failure, unspecified, unspecified whether with hypoxia or hypercapnia: Secondary | ICD-10-CM | POA: Diagnosis not present

## 2020-09-26 ENCOUNTER — Telehealth: Payer: Self-pay

## 2020-09-26 ENCOUNTER — Ambulatory Visit: Payer: Medicare Other

## 2020-09-26 NOTE — Telephone Encounter (Signed)
Pt missed apt today for coumadin clinic. Noticed in chart pt had a fall and hip surgery. Pt is currently in rehab. LVM for John, pt's son, to advised if anything is needed to contact clinic. Advised to contact clinic to RS apt when pt is back home.

## 2020-09-28 DIAGNOSIS — Z471 Aftercare following joint replacement surgery: Secondary | ICD-10-CM | POA: Diagnosis not present

## 2020-09-28 DIAGNOSIS — S72002S Fracture of unspecified part of neck of left femur, sequela: Secondary | ICD-10-CM | POA: Diagnosis not present

## 2020-09-28 DIAGNOSIS — Z96642 Presence of left artificial hip joint: Secondary | ICD-10-CM | POA: Diagnosis not present

## 2020-09-28 DIAGNOSIS — J9690 Respiratory failure, unspecified, unspecified whether with hypoxia or hypercapnia: Secondary | ICD-10-CM | POA: Diagnosis not present

## 2020-09-28 DIAGNOSIS — I10 Essential (primary) hypertension: Secondary | ICD-10-CM | POA: Diagnosis not present

## 2020-09-28 DIAGNOSIS — I5022 Chronic systolic (congestive) heart failure: Secondary | ICD-10-CM | POA: Diagnosis not present

## 2020-09-28 DIAGNOSIS — I4891 Unspecified atrial fibrillation: Secondary | ICD-10-CM | POA: Diagnosis not present

## 2020-09-28 DIAGNOSIS — Z7901 Long term (current) use of anticoagulants: Secondary | ICD-10-CM | POA: Diagnosis not present

## 2020-09-30 DIAGNOSIS — I5022 Chronic systolic (congestive) heart failure: Secondary | ICD-10-CM | POA: Diagnosis not present

## 2020-09-30 DIAGNOSIS — S72002A Fracture of unspecified part of neck of left femur, initial encounter for closed fracture: Secondary | ICD-10-CM | POA: Diagnosis not present

## 2020-09-30 DIAGNOSIS — S72002S Fracture of unspecified part of neck of left femur, sequela: Secondary | ICD-10-CM | POA: Diagnosis not present

## 2020-09-30 DIAGNOSIS — R269 Unspecified abnormalities of gait and mobility: Secondary | ICD-10-CM | POA: Diagnosis not present

## 2020-09-30 DIAGNOSIS — I10 Essential (primary) hypertension: Secondary | ICD-10-CM | POA: Diagnosis not present

## 2020-09-30 DIAGNOSIS — N2889 Other specified disorders of kidney and ureter: Secondary | ICD-10-CM | POA: Diagnosis not present

## 2020-09-30 DIAGNOSIS — Z7901 Long term (current) use of anticoagulants: Secondary | ICD-10-CM | POA: Diagnosis not present

## 2020-09-30 DIAGNOSIS — I4891 Unspecified atrial fibrillation: Secondary | ICD-10-CM | POA: Diagnosis not present

## 2020-09-30 DIAGNOSIS — J9690 Respiratory failure, unspecified, unspecified whether with hypoxia or hypercapnia: Secondary | ICD-10-CM | POA: Diagnosis not present

## 2020-10-05 DIAGNOSIS — G629 Polyneuropathy, unspecified: Secondary | ICD-10-CM | POA: Diagnosis not present

## 2020-10-05 DIAGNOSIS — S72002S Fracture of unspecified part of neck of left femur, sequela: Secondary | ICD-10-CM | POA: Diagnosis not present

## 2020-10-05 DIAGNOSIS — I4891 Unspecified atrial fibrillation: Secondary | ICD-10-CM | POA: Diagnosis not present

## 2020-10-05 DIAGNOSIS — Z7901 Long term (current) use of anticoagulants: Secondary | ICD-10-CM | POA: Diagnosis not present

## 2020-10-05 DIAGNOSIS — J9690 Respiratory failure, unspecified, unspecified whether with hypoxia or hypercapnia: Secondary | ICD-10-CM | POA: Diagnosis not present

## 2020-10-05 DIAGNOSIS — I10 Essential (primary) hypertension: Secondary | ICD-10-CM | POA: Diagnosis not present

## 2020-10-05 DIAGNOSIS — I5022 Chronic systolic (congestive) heart failure: Secondary | ICD-10-CM | POA: Diagnosis not present

## 2020-10-12 DIAGNOSIS — Z7901 Long term (current) use of anticoagulants: Secondary | ICD-10-CM | POA: Diagnosis not present

## 2020-10-12 DIAGNOSIS — I5022 Chronic systolic (congestive) heart failure: Secondary | ICD-10-CM | POA: Diagnosis not present

## 2020-10-12 DIAGNOSIS — I4891 Unspecified atrial fibrillation: Secondary | ICD-10-CM | POA: Diagnosis not present

## 2020-10-12 DIAGNOSIS — I10 Essential (primary) hypertension: Secondary | ICD-10-CM | POA: Diagnosis not present

## 2020-10-12 DIAGNOSIS — G629 Polyneuropathy, unspecified: Secondary | ICD-10-CM | POA: Diagnosis not present

## 2020-10-12 DIAGNOSIS — J9690 Respiratory failure, unspecified, unspecified whether with hypoxia or hypercapnia: Secondary | ICD-10-CM | POA: Diagnosis not present

## 2020-10-17 DIAGNOSIS — I10 Essential (primary) hypertension: Secondary | ICD-10-CM | POA: Diagnosis not present

## 2020-10-17 DIAGNOSIS — I4891 Unspecified atrial fibrillation: Secondary | ICD-10-CM | POA: Diagnosis not present

## 2020-10-17 DIAGNOSIS — Z7901 Long term (current) use of anticoagulants: Secondary | ICD-10-CM | POA: Diagnosis not present

## 2020-10-17 DIAGNOSIS — I5022 Chronic systolic (congestive) heart failure: Secondary | ICD-10-CM | POA: Diagnosis not present

## 2020-10-17 DIAGNOSIS — J9690 Respiratory failure, unspecified, unspecified whether with hypoxia or hypercapnia: Secondary | ICD-10-CM | POA: Diagnosis not present

## 2020-10-21 DIAGNOSIS — J9690 Respiratory failure, unspecified, unspecified whether with hypoxia or hypercapnia: Secondary | ICD-10-CM | POA: Diagnosis not present

## 2020-10-21 DIAGNOSIS — I5022 Chronic systolic (congestive) heart failure: Secondary | ICD-10-CM | POA: Diagnosis not present

## 2020-10-21 DIAGNOSIS — I10 Essential (primary) hypertension: Secondary | ICD-10-CM | POA: Diagnosis not present

## 2020-10-21 DIAGNOSIS — Z7901 Long term (current) use of anticoagulants: Secondary | ICD-10-CM | POA: Diagnosis not present

## 2020-10-24 DIAGNOSIS — I5022 Chronic systolic (congestive) heart failure: Secondary | ICD-10-CM | POA: Diagnosis not present

## 2020-10-24 DIAGNOSIS — U071 COVID-19: Secondary | ICD-10-CM | POA: Diagnosis not present

## 2020-10-24 DIAGNOSIS — I4891 Unspecified atrial fibrillation: Secondary | ICD-10-CM | POA: Diagnosis not present

## 2020-10-24 DIAGNOSIS — S72002S Fracture of unspecified part of neck of left femur, sequela: Secondary | ICD-10-CM | POA: Diagnosis not present

## 2020-10-26 DIAGNOSIS — U071 COVID-19: Secondary | ICD-10-CM | POA: Diagnosis not present

## 2020-10-26 DIAGNOSIS — I4891 Unspecified atrial fibrillation: Secondary | ICD-10-CM | POA: Diagnosis not present

## 2020-10-26 DIAGNOSIS — Z7901 Long term (current) use of anticoagulants: Secondary | ICD-10-CM | POA: Diagnosis not present

## 2020-10-26 DIAGNOSIS — J9690 Respiratory failure, unspecified, unspecified whether with hypoxia or hypercapnia: Secondary | ICD-10-CM | POA: Diagnosis not present

## 2020-10-26 DIAGNOSIS — I5022 Chronic systolic (congestive) heart failure: Secondary | ICD-10-CM | POA: Diagnosis not present

## 2020-11-04 DIAGNOSIS — I5022 Chronic systolic (congestive) heart failure: Secondary | ICD-10-CM | POA: Diagnosis not present

## 2020-11-04 DIAGNOSIS — I10 Essential (primary) hypertension: Secondary | ICD-10-CM | POA: Diagnosis not present

## 2020-11-04 DIAGNOSIS — J9690 Respiratory failure, unspecified, unspecified whether with hypoxia or hypercapnia: Secondary | ICD-10-CM | POA: Diagnosis not present

## 2020-11-04 DIAGNOSIS — S72002S Fracture of unspecified part of neck of left femur, sequela: Secondary | ICD-10-CM | POA: Diagnosis not present

## 2020-11-07 DIAGNOSIS — D649 Anemia, unspecified: Secondary | ICD-10-CM | POA: Diagnosis not present

## 2020-11-07 DIAGNOSIS — Z7901 Long term (current) use of anticoagulants: Secondary | ICD-10-CM | POA: Diagnosis not present

## 2020-11-07 DIAGNOSIS — I5022 Chronic systolic (congestive) heart failure: Secondary | ICD-10-CM | POA: Diagnosis not present

## 2020-11-07 DIAGNOSIS — I4891 Unspecified atrial fibrillation: Secondary | ICD-10-CM | POA: Diagnosis not present

## 2020-11-07 DIAGNOSIS — J9690 Respiratory failure, unspecified, unspecified whether with hypoxia or hypercapnia: Secondary | ICD-10-CM | POA: Diagnosis not present

## 2020-11-07 DIAGNOSIS — I10 Essential (primary) hypertension: Secondary | ICD-10-CM | POA: Diagnosis not present

## 2020-11-09 DIAGNOSIS — I5022 Chronic systolic (congestive) heart failure: Secondary | ICD-10-CM | POA: Diagnosis not present

## 2020-11-09 DIAGNOSIS — J9690 Respiratory failure, unspecified, unspecified whether with hypoxia or hypercapnia: Secondary | ICD-10-CM | POA: Diagnosis not present

## 2020-11-09 DIAGNOSIS — I4891 Unspecified atrial fibrillation: Secondary | ICD-10-CM | POA: Diagnosis not present

## 2020-11-09 DIAGNOSIS — Z7901 Long term (current) use of anticoagulants: Secondary | ICD-10-CM | POA: Diagnosis not present

## 2020-11-09 DIAGNOSIS — G629 Polyneuropathy, unspecified: Secondary | ICD-10-CM | POA: Diagnosis not present

## 2020-11-11 DIAGNOSIS — J9690 Respiratory failure, unspecified, unspecified whether with hypoxia or hypercapnia: Secondary | ICD-10-CM | POA: Diagnosis not present

## 2020-11-11 DIAGNOSIS — I4891 Unspecified atrial fibrillation: Secondary | ICD-10-CM | POA: Diagnosis not present

## 2020-11-11 DIAGNOSIS — I5022 Chronic systolic (congestive) heart failure: Secondary | ICD-10-CM | POA: Diagnosis not present

## 2020-11-11 DIAGNOSIS — I10 Essential (primary) hypertension: Secondary | ICD-10-CM | POA: Diagnosis not present

## 2020-11-11 DIAGNOSIS — Z7901 Long term (current) use of anticoagulants: Secondary | ICD-10-CM | POA: Diagnosis not present

## 2020-11-14 ENCOUNTER — Telehealth: Payer: Self-pay | Admitting: Family Medicine

## 2020-11-14 NOTE — Telephone Encounter (Signed)
Spoke to [place] patient son.  He stated patient in nursing home right now.  He will call back to schedule awv when patient comes home

## 2020-11-16 DIAGNOSIS — I4891 Unspecified atrial fibrillation: Secondary | ICD-10-CM | POA: Diagnosis not present

## 2020-11-16 DIAGNOSIS — J9690 Respiratory failure, unspecified, unspecified whether with hypoxia or hypercapnia: Secondary | ICD-10-CM | POA: Diagnosis not present

## 2020-11-16 DIAGNOSIS — Z7901 Long term (current) use of anticoagulants: Secondary | ICD-10-CM | POA: Diagnosis not present

## 2020-11-16 DIAGNOSIS — G629 Polyneuropathy, unspecified: Secondary | ICD-10-CM | POA: Diagnosis not present

## 2020-11-16 DIAGNOSIS — I5022 Chronic systolic (congestive) heart failure: Secondary | ICD-10-CM | POA: Diagnosis not present

## 2020-11-23 ENCOUNTER — Ambulatory Visit: Payer: Medicare Other | Admitting: Family Medicine

## 2020-11-28 ENCOUNTER — Ambulatory Visit: Payer: Self-pay

## 2020-11-28 NOTE — Progress Notes (Signed)
Pt is no longer taking warfarin and has been transitioned to Eliquis and is on [facility].  Anticoagulation episodes have been resolved.

## 2020-11-28 NOTE — Telephone Encounter (Signed)
Pt  son Jonny Ruiz is calling back and his mother is on eliquis now  and she is in  home hospice for end of care

## 2020-11-28 NOTE — Telephone Encounter (Signed)
LVM for pt's son, Jonny Ruiz, to see how pt is doing and if she has been d/c home yet from SNF.

## 2020-11-28 NOTE — Telephone Encounter (Signed)
Noted. Will discontinue anticoag episodes and forward msg to PCP.

## 2020-11-29 ENCOUNTER — Other Ambulatory Visit: Payer: Self-pay

## 2020-11-29 NOTE — Progress Notes (Signed)
Pt is on hospice and is now taking eliquis and warfarin has been stopped per pts son.   Warfarin removed from med list.

## 2020-12-08 DIAGNOSIS — Z9181 History of falling: Secondary | ICD-10-CM | POA: Diagnosis not present

## 2020-12-23 DIAGNOSIS — Z9181 History of falling: Secondary | ICD-10-CM | POA: Diagnosis not present

## 2020-12-24 DIAGNOSIS — Z9181 History of falling: Secondary | ICD-10-CM | POA: Diagnosis not present

## 2020-12-27 ENCOUNTER — Telehealth: Payer: Medicare Other

## 2021-01-03 ENCOUNTER — Telehealth: Payer: Self-pay

## 2021-01-03 NOTE — Telephone Encounter (Signed)
Received call from Social worker Monica Becton stating patient passed on 01-16-2021

## 2021-01-08 DEATH — deceased
